# Patient Record
Sex: Male | Born: 1960 | Race: White | Hispanic: No | Marital: Single | Smoking: Current every day smoker
Health system: Southern US, Community
[De-identification: ages and names within clinical notes are randomized; demographics above are authoritative.]

## PROBLEM LIST (undated history)

## (undated) DIAGNOSIS — I5030 Unspecified diastolic (congestive) heart failure: Secondary | ICD-10-CM

## (undated) DIAGNOSIS — R739 Hyperglycemia, unspecified: Secondary | ICD-10-CM

## (undated) DIAGNOSIS — T7840XA Allergy, unspecified, initial encounter: Secondary | ICD-10-CM

## (undated) DIAGNOSIS — D751 Secondary polycythemia: Secondary | ICD-10-CM

## (undated) DIAGNOSIS — I10 Essential (primary) hypertension: Secondary | ICD-10-CM

## (undated) DIAGNOSIS — J449 Chronic obstructive pulmonary disease, unspecified: Secondary | ICD-10-CM

## (undated) HISTORY — PX: KNEE SURGERY: SHX244

---

## 2012-04-21 ENCOUNTER — Emergency Department: Payer: Self-pay | Admitting: Unknown Physician Specialty

## 2012-04-21 LAB — COMPREHENSIVE METABOLIC PANEL
Albumin: 4 g/dL (ref 3.4–5.0)
Alkaline Phosphatase: 96 U/L (ref 50–136)
Anion Gap: 10 (ref 7–16)
Calcium, Total: 8.8 mg/dL (ref 8.5–10.1)
Chloride: 105 mmol/L (ref 98–107)
Co2: 24 mmol/L (ref 21–32)
Creatinine: 0.82 mg/dL (ref 0.60–1.30)
EGFR (African American): >60
EGFR (Non-African Amer.): >60
Osmolality: 276 (ref 275–301)
Potassium: 4.8 mmol/L (ref 3.5–5.1)
SGOT(AST): 47 U/L — ABNORMAL HIGH (ref 15–37)
SGPT (ALT): 30 U/L (ref 12–78)
Sodium: 139 mmol/L (ref 136–145)

## 2012-04-21 LAB — CBC
HGB: 17.7 g/dL (ref 13.0–18.0)
MCH: 33.4 pg (ref 26.0–34.0)
MCHC: 35.3 g/dL (ref 32.0–36.0)
Platelet: 241 10*3/uL (ref 150–440)
RDW: 13.6 % (ref 11.5–14.5)
WBC: 7.2 10*3/uL (ref 3.8–10.6)

## 2015-08-04 ENCOUNTER — Emergency Department (HOSPITAL_COMMUNITY)
Admission: EM | Admit: 2015-08-04 | Discharge: 2015-08-04 | Disposition: A | Discharge status: Home / Self Care | Payer: Self-pay | Attending: Emergency Medicine | Admitting: Emergency Medicine

## 2015-08-04 ENCOUNTER — Encounter (HOSPITAL_COMMUNITY): Payer: Self-pay | Admitting: Nurse Practitioner

## 2015-08-04 DIAGNOSIS — S70361A Insect bite (nonvenomous), right thigh, initial encounter: Secondary | ICD-10-CM | POA: Insufficient documentation

## 2015-08-04 DIAGNOSIS — Y9289 Other specified places as the place of occurrence of the external cause: Secondary | ICD-10-CM | POA: Insufficient documentation

## 2015-08-04 DIAGNOSIS — J029 Acute pharyngitis, unspecified: Secondary | ICD-10-CM | POA: Insufficient documentation

## 2015-08-04 DIAGNOSIS — R220 Localized swelling, mass and lump, head: Secondary | ICD-10-CM | POA: Insufficient documentation

## 2015-08-04 DIAGNOSIS — R0602 Shortness of breath: Secondary | ICD-10-CM | POA: Insufficient documentation

## 2015-08-04 DIAGNOSIS — F17200 Nicotine dependence, unspecified, uncomplicated: Secondary | ICD-10-CM | POA: Insufficient documentation

## 2015-08-04 DIAGNOSIS — Y998 Other external cause status: Secondary | ICD-10-CM | POA: Insufficient documentation

## 2015-08-04 DIAGNOSIS — Z7982 Long term (current) use of aspirin: Secondary | ICD-10-CM | POA: Insufficient documentation

## 2015-08-04 DIAGNOSIS — W57XXXA Bitten or stung by nonvenomous insect and other nonvenomous arthropods, initial encounter: Secondary | ICD-10-CM | POA: Insufficient documentation

## 2015-08-04 DIAGNOSIS — T7840XA Allergy, unspecified, initial encounter: Secondary | ICD-10-CM | POA: Insufficient documentation

## 2015-08-04 DIAGNOSIS — Y9389 Activity, other specified: Secondary | ICD-10-CM | POA: Insufficient documentation

## 2015-08-04 MED ORDER — PREDNISONE 50 MG PO TABS: ORAL_TABLET | ORAL | Status: DC

## 2015-08-04 MED ORDER — METHYLPREDNISOLONE SODIUM SUCC 125 MG IJ SOLR
125.0000 mg | Freq: Once | INTRAMUSCULAR | Status: AC
  Administered 2015-08-04: 125 mg via INTRAVENOUS
  Filled 2015-08-04: qty 2

## 2015-08-04 MED ORDER — EPINEPHRINE 0.3 MG/0.3ML IJ SOAJ: 0.3000 mg | Freq: Once | INTRAMUSCULAR | Status: DC

## 2015-08-04 MED ORDER — FAMOTIDINE IN NACL 20-0.9 MG/50ML-% IV SOLN
20.0000 mg | Freq: Once | INTRAVENOUS | Status: AC
  Administered 2015-08-04: 20 mg via INTRAVENOUS
  Filled 2015-08-04: qty 50

## 2015-08-04 MED ORDER — DIPHENHYDRAMINE HCL 25 MG PO TABS: 25.0000 mg | ORAL_TABLET | Freq: Four times a day (QID) | ORAL | Status: DC | PRN

## 2015-08-04 NOTE — ED Provider Notes (Signed)
CSN: [id number]     Arrival date & time 08/04/15  1550 History   First MD Initiated Contact with Patient 08/04/15 1557     Chief Complaint  Patient presents with  . Allergic Reaction   HPI  Bryan Mitchell is a 55 year old male presenting with allergic reaction. He reports being on his tractor when he noticed "a bug bite" to his right posterior thigh. He began scratching it and then noted his entire body became itchy. He states the areas that he scratched would also become swollen and "turn into welts". He reports swelling to his lips and hands. He also endorsed feelings of his throat closing and shortness of breath. He denies difficulty handling secretions. His family then called EMS who gave him 0.3 mg epinephrine en route. He had also taken benadryl prior to EMS arrival. He states that his symptoms have improved since receiving the epinephrine at 1430. He states he still has a minor throat itching sensation. He states his skin still looks red but is no longer pruritic. EMS noted that his O2 was low on RA. Pt reports a 40 pack year history. He has never followed with a PCP. He denies cough or SOB currently. Denies fevers, chills, syncope, dizziness, headache, eyelid swelling, tongue swelling, neck stiffness, nausea, vomiting or diarrhea.   History reviewed. No pertinent past medical history. History reviewed. No pertinent past surgical history. History reviewed. No pertinent family history. Social History  Substance Use Topics  . Smoking status: Current Every Day Smoker  . Smokeless tobacco: None  . Alcohol Use: Yes    Review of Systems  Constitutional: Negative for fever, chills and diaphoresis.  HENT: Positive for facial swelling and sore throat (itchy). Negative for drooling, rhinorrhea and trouble swallowing.   Eyes: Negative for itching.  Respiratory: Positive for shortness of breath. Negative for cough and wheezing.   Cardiovascular: Negative for chest pain.  Gastrointestinal: Negative  for nausea, vomiting, abdominal pain and diarrhea.  Skin: Positive for color change.  Neurological: Negative for dizziness, syncope and headaches.  All other systems reviewed and are negative.     Allergies  Review of patient's allergies indicates no known allergies.  Home Medications   Prior to Admission medications   Medication Sig Start Date End Date Taking? Authorizing Provider  aspirin 325 MG tablet Take 325 mg by mouth once.    Historical Provider, MD  diphenhydrAMINE (BENADRYL) 25 MG tablet Take 1 tablet (25 mg total) by mouth every 6 (six) hours as needed. 08/04/15   Shyheem Whitham, PA-C  EPINEPHrine 0.3 mg/0.3 mL IJ SOAJ injection Inject 0.3 mLs (0.3 mg total) into the muscle once. 08/04/15   Shunsuke Granzow, PA-C  predniSONE (DELTASONE) 50 MG tablet Take 1 tablet once a day for 5 days 08/04/15   Orman Matsumura, PA-C   BP 119/76 mmHg  Pulse 111  Temp(Src) 98.1 F (36.7 C) (Oral)  Resp 18  SpO2 95% Physical Exam  Constitutional: He appears well-developed and well-nourished. No distress.  BP 122/79 O2 94%  HENT:  Head: Normocephalic and atraumatic.  Mouth/Throat: Oropharynx is clear and moist.  No eyelid, lip or tongue edema. Oropharynx is clear without edema but appears slightly erythematous. Uvula is midline without edema. Pt handling secretions well.   Eyes: Conjunctivae are normal. Right eye exhibits no discharge. Left eye exhibits no discharge. No scleral icterus.  Neck: Normal range of motion. Neck supple.  No stridor. FROM of neck intact.   Cardiovascular: Normal rate, regular rhythm and  normal heart sounds.   Pulmonary/Chest: Effort normal and breath sounds normal. No respiratory distress. He has no wheezes. He has no rales.  Breathing unlabored. Lungs CTAB  Abdominal: Soft. He exhibits no distension. There is no tenderness.  Musculoskeletal: Normal range of motion.  Moves all extremities spontaneously  Neurological: He is alert. Coordination normal.  Skin: Skin is  warm and dry. No rash noted.  Faint streaks of erythema noted over forearms and back where pt had been scratching.   Psychiatric: He has a normal mood and affect. His behavior is normal.  Nursing note and vitals reviewed.   ED Course  Procedures (including critical care time) Labs Review Labs Reviewed - No data to display  Imaging Review No results found. I have personally reviewed and evaluated these images and lab results as part of my medical decision-making.   EKG Interpretation None     6:15 - Re-evaluated pt. Reports improvement in symptoms. States his throat no longer feels "swollen". O2 remains ~95% on RA. Pt is requesting discharge home. It has been 4 hours since epipen administration and patient has had no new or recurrent symptoms. Discussed that pt will need to follow up with PCP which he states he will not do. Long discussion about importance of PCP for follow up. Pt states understanding but still declines. Will provide with [facility] information in discharge paperwork.   MDM   Final diagnoses:  Allergic reaction, initial encounter   Patient presenting with allergic reaction. Denies difficulty breathing, swallowing or handling secretions. No edema of eyelids or lips, no oropharyngeal edema, no stridor, no wheezing and patient is in no respiratory distress. Treated with epipen, solu-medrol, benadryl and pepcid. Patient re-evaluated prior to dc and reports symptom resolution. Patient is hemodynamically stable, in no respiratory distress, and denies the feeling of throat closing. Oxygen remained between 92-95% during stay. Likely slightly decreased due to 40 pack year history. Will discharge with epipen, prednisone burst and benadryl. Instructed to return to the ED if they have recurrent reaction with symptoms including difficulty breathing, difficulty swallowing, sensation of throat closing or swelling of the lips, face or tongue. Pt is to follow up with their PCP as needed. Pt is  agreeable with plan & verbalizes understanding. Return precautions given in discharge paperwork and discussed with pt at bedside. Pt stable for discharge      Alveta Heimlich, PA-C 08/04/15 1948  Alvira Monday, MD 08/05/15 2356

## 2015-08-04 NOTE — ED Notes (Signed)
Patient developed a welt on his left leg earlier today and noticed swelling in his hands and throat. He also complained of itching all over. He took  of benadryl and ems gave him 0.3mg  of epinephrine. He states his symptoms have improved but his SpO2 was dropping without supplemental oxygen with EMS.

## 2015-08-04 NOTE — Discharge Instructions (Signed)
Schedule a follow up appointment with a primary care provider to follow up. Return to ED if your symptoms return, your experience, eyelid, lip or tongue swelling, you feel sensation of throat swelling or closing, wheezing, shortness of breath, passing out or any other, new, worsening or concerning symptoms.  Anaphylactic Reaction    An anaphylactic reaction is a sudden, severe allergic reaction that involves the whole body. It can be life threatening. A hospital stay is often required. People with asthma, eczema, or hay fever are slightly more likely to have an anaphylactic reaction.  CAUSES  An anaphylactic reaction may be caused by anything to which you are allergic. After being exposed to the allergic substance, your immune system becomes sensitized to it. When you are exposed to that allergic substance again, an allergic reaction can occur. Common causes of an anaphylactic reaction include:  Medicines.  Foods, especially peanuts, wheat, shellfish, milk, and eggs.  Insect bites or stings.  Blood products.  Chemicals, such as dyes, latex, and contrast material used for imaging tests. SYMPTOMS  When an allergic reaction occurs, the body releases histamine and other substances. These substances cause symptoms such as tightening of the airway. Symptoms often develop within seconds or minutes of exposure. Symptoms may include:  Skin rash or hives.  Itching.  Chest tightness.  Swelling of the eyes, tongue, or lips.  Trouble breathing or swallowing.  Lightheadedness or fainting.  Anxiety or confusion.  Stomach pains, vomiting, or diarrhea.  Nasal congestion.  A fast or irregular heartbeat (palpitations). DIAGNOSIS  Diagnosis is based on your history of recent exposure to allergic substances, your symptoms, and a physical exam. Your caregiver may also perform blood or urine tests to confirm the diagnosis.  TREATMENT  Epinephrine medicine is the main treatment for an anaphylactic reaction.  Other medicines that may be used for treatment include antihistamines, steroids, and albuterol. In severe cases, fluids and medicine to support blood pressure may be given through an intravenous line (IV). Even if you improve after treatment, you need to be observed to make sure your condition does not get worse. This may require a stay in the hospital.  [place] a medical alert bracelet or necklace stating your allergy.  You and your family must learn how to use an anaphylaxis kit or give an epinephrine injection to temporarily treat an emergency allergic reaction. Always carry your epinephrine injection or anaphylaxis kit with you. This can be lifesaving if you have a severe reaction.  Do not drive or perform tasks after treatment until the medicines used to treat your reaction have worn off, or until your caregiver says it is okay.  If you have hives or a rash:  Take medicines as directed by your caregiver.  You may use an over-the-counter antihistamine (diphenhydramine) as needed.  Apply cold compresses to the skin or take baths in cool water. Avoid hot baths or showers. SEEK MEDICAL CARE IF:  You develop symptoms of an allergic reaction to a new substance. Symptoms may start right away or minutes later.  You develop a rash, hives, or itching.  You develop new symptoms. SEEK IMMEDIATE MEDICAL CARE IF:  You have swelling of the mouth, difficulty breathing, or wheezing.  You have a tight feeling in your chest or throat.  You develop hives, swelling, or itching all over your body.  You develop severe vomiting or diarrhea.  You feel faint or pass out. This is an emergency. Use your epinephrine injection or anaphylaxis kit  as you have been instructed. Call your local emergency services (911 in [place] Even if you improve after the injection, you need to be examined at a hospital emergency department.  MAKE SURE YOU:  Understand these instructions.  Will watch your condition.    Will get help right away if you are not doing well or get worse. This information is not intended to replace advice given to you by your health care provider. Make sure you discuss any questions you have with your health care provider.  Document Released: 06/24/2005 Document Revised: 06/29/2013 Document Reviewed: 01/04/2015  Elsevier Interactive Patient Education [employer].

## 2015-08-04 NOTE — ED Notes (Signed)
Bed: YQ65 Expected date:  Expected time:  Means of arrival:  Comments: Ems-allergic reaction

## 2015-12-29 ENCOUNTER — Emergency Department (HOSPITAL_COMMUNITY)
Admission: EM | Admit: 2015-12-29 | Discharge: 2015-12-29 | Disposition: A | Discharge status: Home / Self Care | Payer: Self-pay | Attending: Emergency Medicine | Admitting: Emergency Medicine

## 2015-12-29 ENCOUNTER — Encounter (HOSPITAL_COMMUNITY): Payer: Self-pay

## 2015-12-29 ENCOUNTER — Emergency Department (HOSPITAL_COMMUNITY): Payer: Self-pay

## 2015-12-29 ENCOUNTER — Other Ambulatory Visit: Payer: Self-pay

## 2015-12-29 DIAGNOSIS — T782XXA Anaphylactic shock, unspecified, initial encounter: Secondary | ICD-10-CM | POA: Insufficient documentation

## 2015-12-29 DIAGNOSIS — L509 Urticaria, unspecified: Secondary | ICD-10-CM

## 2015-12-29 DIAGNOSIS — R42 Dizziness and giddiness: Secondary | ICD-10-CM | POA: Insufficient documentation

## 2015-12-29 DIAGNOSIS — F17200 Nicotine dependence, unspecified, uncomplicated: Secondary | ICD-10-CM | POA: Insufficient documentation

## 2015-12-29 DIAGNOSIS — Z7982 Long term (current) use of aspirin: Secondary | ICD-10-CM | POA: Insufficient documentation

## 2015-12-29 LAB — CBC
HEMATOCRIT: 55 % — AB (ref 39.0–52.0)
Hemoglobin: 18.3 g/dL — ABNORMAL HIGH (ref 13.0–17.0)
MCH: 31 pg (ref 26.0–34.0)
MCHC: 33.3 g/dL (ref 30.0–36.0)
MCV: 93.1 fL (ref 78.0–100.0)
PLATELETS: 250 10*3/uL (ref 150–400)
RBC: 5.91 MIL/uL — ABNORMAL HIGH (ref 4.22–5.81)
RDW: 12.9 % (ref 11.5–15.5)
WBC: 6.8 10*3/uL (ref 4.0–10.5)

## 2015-12-29 LAB — COMPREHENSIVE METABOLIC PANEL
ALT: 22 U/L (ref 17–63)
AST: 23 U/L (ref 15–41)
Albumin: 3.6 g/dL (ref 3.5–5.0)
Alkaline Phosphatase: 65 U/L (ref 38–126)
Anion gap: 9 (ref 5–15)
BUN: 21 mg/dL — AB (ref 6–20)
CHLORIDE: 102 mmol/L (ref 101–111)
CO2: 28 mmol/L (ref 22–32)
CREATININE: 1.87 mg/dL — AB (ref 0.61–1.24)
Calcium: 9.5 mg/dL (ref 8.9–10.3)
GFR calc Af Amer: 46 mL/min — ABNORMAL LOW (ref >60)
GFR, EST NON AFRICAN AMERICAN: 39 mL/min — AB (ref >60)
GLUCOSE: 140 mg/dL — AB (ref 65–99)
Potassium: 3.8 mmol/L (ref 3.5–5.1)
Sodium: 139 mmol/L (ref 135–145)
Total Bilirubin: 0.5 mg/dL (ref 0.3–1.2)
Total Protein: 6.8 g/dL (ref 6.5–8.1)

## 2015-12-29 LAB — I-STAT CG4 LACTIC ACID, ED: LACTIC ACID, VENOUS: 2.98 mmol/L — AB (ref 0.5–2.0)

## 2015-12-29 LAB — I-STAT TROPONIN, ED: Troponin i, poc: 0 ng/mL (ref 0.00–0.08)

## 2015-12-29 MED ORDER — SODIUM CHLORIDE 0.9 % IV BOLUS (SEPSIS)
1000.0000 mL | Freq: Once | INTRAVENOUS | Status: AC
  Administered 2015-12-29: 1000 mL via INTRAVENOUS

## 2015-12-29 MED ORDER — ALBUTEROL SULFATE (2.5 MG/3ML) 0.083% IN NEBU
5.0000 mg | INHALATION_SOLUTION | Freq: Once | RESPIRATORY_TRACT | Status: AC
  Administered 2015-12-29: 5 mg via RESPIRATORY_TRACT
  Filled 2015-12-29: qty 6

## 2015-12-29 MED ORDER — FAMOTIDINE IN NACL 20-0.9 MG/50ML-% IV SOLN
20.0000 mg | Freq: Once | INTRAVENOUS | Status: AC
  Administered 2015-12-29: 20 mg via INTRAVENOUS
  Filled 2015-12-29: qty 50

## 2015-12-29 MED ORDER — EPINEPHRINE 0.3 MG/0.3ML IJ SOAJ
0.3000 mg | Freq: Once | INTRAMUSCULAR | Status: AC
  Administered 2015-12-29: 0.3 mg via INTRAMUSCULAR
  Filled 2015-12-29: qty 0.3

## 2015-12-29 MED ORDER — METHYLPREDNISOLONE SODIUM SUCC 125 MG IJ SOLR
125.0000 mg | Freq: Once | INTRAMUSCULAR | Status: AC
  Administered 2015-12-29: 125 mg via INTRAVENOUS
  Filled 2015-12-29: qty 2

## 2015-12-29 MED ORDER — EPINEPHRINE 0.3 MG/0.3ML IJ SOAJ: 0.3000 mg | Freq: Once | INTRAMUSCULAR | Status: DC

## 2015-12-29 NOTE — Discharge Instructions (Signed)
Benadryl for hives and itching. Epi pen if you feel persistent lightheaded/ pass out of breathing difficulty/ tongue swelling.  If you were given medicines take as directed.  If you are on coumadin or contraceptives realize their levels and effectiveness is altered by many different medicines.  If you have any reaction (rash, tongues swelling, other) to the medicines stop taking and see a physician.    If your blood pressure was elevated in the ER make sure you follow up for management with a primary doctor or return for chest pain, shortness of breath or stroke symptoms.  Please follow up as directed and return to the ER or see a physician for new or worsening symptoms.  Thank you. Filed Vitals:   12/29/15 2015 12/29/15 2030 12/29/15 2045 12/29/15 2202  BP: 144/91 133/81 152/73 158/83  Pulse: 112 115 116 106  Resp: 22 25 24 16   SpO2: 96% 95% 92% 94%    Anaphylactic Reaction An anaphylactic reaction is a sudden, severe allergic reaction. It affects the whole body. It can be life threatening. You may need to stay in the hospital.  [place] a medical bracelet or necklace that lists your allergy.  Carry your allergy kit or medicine shot to treat severe allergic reactions with you. These can save your life.  Do not drive until medicine from your shot has worn off, unless your doctor says it is okay.  If you have hives or a rash:  Take medicine as told by your doctor.  You may take over-the-counter antihistamine medicine.  Place cold cloths on your skin. Take baths in cool water. Avoid hot baths and hot showers. GET HELP RIGHT AWAY IF:   Your mouth is puffy (swollen), or you have trouble breathing.  You start making whistling sounds when you breathe (wheezing).  You have a tight feeling in your chest or throat.  You have a rash, hives, puffiness, or itching on your body.  You throw up (vomit) or have watery poop (diarrhea).  You feel dizzy or pass out (faint).  You  think you are having an allergic reaction.  You have new symptoms. This is an emergency. Use your medicine shot or allergy kit as told. Call your local emergency services (911 in [place] Even if you feel better after the shot, you need to go to the hospital emergency department. MAKE SURE YOU:   Understand these instructions.  Will watch your condition.  Will get help right away if you are not doing well or get worse.   This information is not intended to replace advice given to you by your health care provider. Make sure you discuss any questions you have with your health care provider.   Document Released: 12/11/2007 Document Revised: 12/24/2011 Document Reviewed: 01/04/2015 Elsevier Interactive Patient Education [employer].

## 2015-12-29 NOTE — ED Notes (Signed)
Pt. Was outside mowing his grass and began to notice that he was having redness to his rt. Posterior thigh and pt took 75 mg benadryl.   Paramedics gave him 0.3 Epi  SQ at 17: 15 felt better and started to have dizziness.  Redness has decreased but swelling is noted to bilateral arms and legs.  Airway intact and pt. Is talking in full sentences.  Pt. Is hypotensive.

## 2015-12-29 NOTE — ED Provider Notes (Signed)
CSN: [id number]     Arrival date & time 12/29/15  1800 History   First MD Initiated Contact with Patient 12/29/15 1803     Chief Complaint  Patient presents with  . Allergic Reaction     (Consider location/radiation/quality/duration/timing/severity/associated sxs/prior Treatment) HPI Comments: 55 year old male with smoking history no other significant medical problems presents with hypotension and rash. Patient is cutting his grass he started feeling generally itchy and noticed a rash on his thighs. Patient took Benadryl and continue to cut the grass and then he started feeling very lightheaded. For EMS with blood pressure was in the 70s. Patient received epinephrine injection and brought the hospital. Patient Benadryl at home. No known allergies. Patient did not feel a specific bite and no other new exposures or new medications. No history of similar.  Patient is a 55 y.o. male presenting with allergic reaction. The history is provided by the patient.  Allergic Reaction Presenting symptoms: rash     History reviewed. No pertinent past medical history. History reviewed. No pertinent past surgical history. No family history on file. Social History  Substance Use Topics  . Smoking status: Current Every Day Smoker  . Smokeless tobacco: None  . Alcohol Use: Yes    Review of Systems  Constitutional: Positive for appetite change. Negative for fever and chills.  HENT: Negative for congestion.   Eyes: Negative for visual disturbance.  Respiratory: Negative for shortness of breath.   Cardiovascular: Negative for chest pain.  Gastrointestinal: Negative for vomiting and abdominal pain.  Genitourinary: Negative for dysuria and flank pain.  Musculoskeletal: Negative for back pain, neck pain and neck stiffness.  Skin: Positive for rash.  Neurological: Positive for light-headedness. Negative for headaches.      Allergies  Other  Home Medications   Prior to Admission medications    Medication Sig Start Date End Date Taking? Authorizing Provider  Aspirin-Acetaminophen (GOODYS BODY PAIN PO) Take 1 packet by mouth daily as needed (for aches/pains (back)).   Yes Historical Provider, MD  diphenhydrAMINE (BENADRYL) 25 MG tablet Take 1 tablet (25 mg total) by mouth every 6 (six) hours as needed. 08/04/15  Yes Stevi Barrett, PA-C  EPINEPHrine 0.3 mg/0.3 mL IJ SOAJ injection Inject 0.3 mLs (0.3 mg total) into the muscle once. 08/04/15  Yes Stevi Barrett, PA-C  EPINEPHrine 0.3 mg/0.3 mL IJ SOAJ injection Inject 0.3 mLs (0.3 mg total) into the muscle once. 12/29/15   Blane Ohara, MD  predniSONE (DELTASONE) 50 MG tablet Take 1 tablet once a day for 5 days Patient not taking: Reported on 12/29/2015 08/04/15   Stevi Barrett, PA-C   BP 158/83 mmHg  Pulse 106  Resp 16  SpO2 94% Physical Exam  Constitutional: He is oriented to person, place, and time. He appears well-developed and well-nourished.  HENT:  Head: Normocephalic and atraumatic.  Dry mucous membranes No angioedema  Eyes: Right eye exhibits no discharge. Left eye exhibits no discharge.  Neck: Normal range of motion. Neck supple. No tracheal deviation present.  Cardiovascular: Regular rhythm.  Tachycardia present.   Pulmonary/Chest: Effort normal. He has wheezes (mild exp bilateral).  Abdominal: Soft. He exhibits no distension. There is no tenderness. There is no guarding.  Musculoskeletal: He exhibits no edema.  Neurological: He is alert and oriented to person, place, and time. He has normal strength. No cranial nerve deficit. GCS eye subscore is 4. GCS verbal subscore is 5. GCS motor subscore is 6.  General weak appearing. Asian alert and oriented.  Skin: Skin is  warm. Rash noted.  Patient has hive-like rash posterior thighs and medial thighs bilateral. Patient has fungal appearing rash in the groin  Psychiatric: He has a normal mood and affect.  Nursing note and vitals reviewed.   ED Course  Procedures (including  critical care time) CRITICAL CARE Performed by: Enid SkeensZAVITZ, Demeka Sutter M  Total critical care time: 35 minutes  Critical care time was exclusive of separately billable procedures and treating other patients.  Critical care was necessary to treat or prevent imminent or life-threatening deterioration.  Critical care was time spent personally by me on the following activities: development of treatment plan with patient and/or surrogate as well as nursing, discussions with consultants, evaluation of patient's response to treatment, examination of patient, obtaining history from patient or surrogate, ordering and performing treatments and interventions, ordering and review of laboratory studies, ordering and review of radiographic studies, pulse oximetry and re-evaluation of patient's condition.  Emergency Ultrasound Study:   Angiocath insertion Performed by: Enid SkeensZAVITZ, Kaydyn Sayas M  Consent: Verbal consent obtained. Risks and benefits: risks, benefits and alternatives were discussed Immediately prior to procedure the correct patient, procedure, equipment, support staff and site/side marked as needed.  Indication: difficult IV access Preparation: Patient was prepped and draped in the usual sterile fashion. Vein Location: right ac vein was visualized during assessment for potential access sites and was found to be patent/ easily compressed with linear ultrasound.  The needle was visualized with real-time ultrasound and guided into the vein. Gauge: 18 g  Image saved and stored.  Normal blood return.  Patient tolerance: Patient tolerated the procedure well with no immediate complications.     EMERGENCY DEPARTMENT US CARDIAC EXAM "Study: Limited Ultrasound of the heart and pericardium"  INDICATIONS:Hypotension Multiple views of the heart and pericardium were obtained in real-time with a multi-frequency probe.  PERFORMED ZO:XWRUEABY:Myself  IMAGES ARCHIVED?: Yes  FINDINGS: No pericardial effusion, IVC flat and  Tamponade physiology absent  LIMITATIONS:  Body habitus  VIEWS USED: Subcostal 4 chamber and Inferior Vena Cava  INTERPRETATION: Cardiac activity present, Pericardial effusioin absent and Probable low CVP Very difficulty views, only SX CPT Code: 54098-1193308-26 (limited transthoracic cardiac)  Labs Review Labs Reviewed  CBC - Abnormal; Notable for the following:    RBC 5.91 (*)    Hemoglobin 18.3 (*)    HCT 55.0 (*)    All other components within normal limits  COMPREHENSIVE METABOLIC PANEL - Abnormal; Notable for the following:    Glucose, Bld 140 (*)    BUN 21 (*)    Creatinine, Ser 1.87 (*)    GFR calc non Af Amer 39 (*)    GFR calc Af Amer 46 (*)    All other components within normal limits  I-STAT CG4 LACTIC ACID, ED - Abnormal; Notable for the following:    Lactic Acid, Venous 2.98 (*)    All other components within normal limits  I-STAT TROPOININ, ED    Imaging Review Dg Chest Portable 1 View  12/29/2015  CLINICAL DATA:  Allergic reaction of unknown origin, mid chest pain, shortness of breath, hypotension, smoker EXAM: PORTABLE CHEST 1 VIEW COMPARISON:  Portable exam 1831 hours without priors for comparison. FINDINGS: Normal heart size and mediastinal contours. Minimal pulmonary vascular cephalization. Lungs clear. No pleural effusion or pneumothorax. Bones unremarkable. IMPRESSION: No acute abnormalities. Electronically Signed   By: Ulyses SouthwardMark  Boles M.D.   On: 12/29/2015 18:49   I have personally reviewed and evaluated these images and lab results as part of my medical decision-making.  EKG Interpretation None     EKG reviewed heart rate 103 sinus tachycardia, normal QT, no acute ST changes. MDM   Final diagnoses:  Anaphylactic shock, initial encounter  Hives   Patient presents with hypotension and rash. Clinical concern for anaphylaxis however no definitive bite or obvious source per history and report. Plan for blood work, cardiac screen, epinephrine emergently  intramuscular. If no improvement fluids and treatment plan for epinephrine drip. No angioedema.  BP normalized, pt sxs resolved, ambulated, po challenge.  He requesting to go home, has epi pen at home.   Results and differential diagnosis were discussed with the patient/parent/guardian. Xrays were independently reviewed by myself.  Close follow up outpatient was discussed, comfortable with the plan.   Medications  sodium chloride 0.9 % bolus 1,000 mL (0 mLs Intravenous Stopped 12/29/15 2033)  famotidine (PEPCID) IVPB 20 mg premix (0 mg Intravenous Stopped 12/29/15 1922)  methylPREDNISolone sodium succinate (SOLU-MEDROL) 125 mg/2 mL injection 125 mg (125 mg Intravenous Given 12/29/15 1833)  EPINEPHrine (EPI-PEN) injection 0.3 mg (0.3 mg Intramuscular Given 12/29/15 1857)  albuterol (PROVENTIL) (2.5 MG/3ML) 0.083% nebulizer solution 5 mg (5 mg Nebulization Given 12/29/15 1859)  sodium chloride 0.9 % bolus 1,000 mL (0 mLs Intravenous Stopped 12/29/15 2033)    Filed Vitals:   12/29/15 2015 12/29/15 2030 12/29/15 2045 12/29/15 2202  BP: 144/91 133/81 152/73 158/83  Pulse: 112 115 116 106  Resp: 22 25 24 16   SpO2: 96% 95% 92% 94%    Final diagnoses:  Anaphylactic shock, initial encounter  Hives       Blane OharaJoshua Kenechukwu Eckstein, MD 12/29/15 2222

## 2015-12-29 NOTE — ED Notes (Signed)
Dr. Danae ChenZavit explained plan of care and discharge plan to pt.

## 2016-02-24 ENCOUNTER — Emergency Department (HOSPITAL_COMMUNITY)
Admission: EM | Admit: 2016-02-24 | Discharge: 2016-02-24 | Disposition: A | Discharge status: Home / Self Care | Payer: Self-pay | Attending: Emergency Medicine | Admitting: Emergency Medicine

## 2016-02-24 ENCOUNTER — Encounter (HOSPITAL_COMMUNITY): Payer: Self-pay

## 2016-02-24 DIAGNOSIS — R21 Rash and other nonspecific skin eruption: Secondary | ICD-10-CM | POA: Insufficient documentation

## 2016-02-24 DIAGNOSIS — Z7982 Long term (current) use of aspirin: Secondary | ICD-10-CM | POA: Insufficient documentation

## 2016-02-24 DIAGNOSIS — T7840XA Allergy, unspecified, initial encounter: Secondary | ICD-10-CM

## 2016-02-24 DIAGNOSIS — F17200 Nicotine dependence, unspecified, uncomplicated: Secondary | ICD-10-CM | POA: Insufficient documentation

## 2016-02-24 DIAGNOSIS — T781XXA Other adverse food reactions, not elsewhere classified, initial encounter: Secondary | ICD-10-CM | POA: Insufficient documentation

## 2016-02-24 MED ORDER — FAMOTIDINE IN NACL 20-0.9 MG/50ML-% IV SOLN
20.0000 mg | Freq: Once | INTRAVENOUS | Status: DC
  Filled 2016-02-24: qty 50

## 2016-02-24 MED ORDER — FAMOTIDINE 20 MG PO TABS
20.0000 mg | ORAL_TABLET | Freq: Once | ORAL | Status: AC
  Administered 2016-02-24: 20 mg via ORAL
  Filled 2016-02-24: qty 1

## 2016-02-24 MED ORDER — DEXAMETHASONE SODIUM PHOSPHATE 10 MG/ML IJ SOLN
10.0000 mg | Freq: Once | INTRAMUSCULAR | Status: AC
  Administered 2016-02-24: 10 mg via INTRAMUSCULAR
  Filled 2016-02-24: qty 1

## 2016-02-24 MED ORDER — DIPHENHYDRAMINE HCL 50 MG/ML IJ SOLN
25.0000 mg | Freq: Once | INTRAMUSCULAR | Status: AC
  Administered 2016-02-24: 25 mg via INTRAMUSCULAR

## 2016-02-24 MED ORDER — SODIUM CHLORIDE 0.9 % IV BOLUS (SEPSIS): 1000.0000 mL | Freq: Once | INTRAVENOUS | Status: DC

## 2016-02-24 MED ORDER — METHYLPREDNISOLONE SODIUM SUCC 125 MG IJ SOLR
125.0000 mg | Freq: Once | INTRAMUSCULAR | Status: DC
  Filled 2016-02-24: qty 2

## 2016-02-24 MED ORDER — DIPHENHYDRAMINE HCL 50 MG/ML IJ SOLN
12.5000 mg | Freq: Once | INTRAMUSCULAR | Status: DC
  Filled 2016-02-24: qty 1

## 2016-02-24 NOTE — ED Provider Notes (Signed)
[facility] DEPT Provider Note   CSN: [id number] Arrival date & time: 02/24/16  1758     History   Chief Complaint Chief Complaint  Patient presents with  . Allergic Reaction    HPI Bryan Mitchell is a 55 y.o. male the past medical history of obesity, COPD who presents to the ED today complaining of an allergic reaction. Patient states that he ate a hamburger with chili powder on it and shortly after developed hives on his posterior neck, back, bilateral arms and back of his legs. Patient began to feel like his throat was closing and he took some oral Benadryl. He states he walked over to his brother's house who checked his blood pressure which was elevated so he decided to call 911. On EMS arrival they gave him an epinephrine injection and a breathing treatment. They were unable to obtain IV access. Patient now states that he feels significantly better. She denies any difficulty breathing, lip or tongue swelling.  Upon review of patient's records. 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 Patient then became hypotensive.  HPI  History reviewed. No pertinent past medical history.  There are no active problems to display for this patient.   History reviewed. No pertinent surgical history.     Home Medications    Prior to Admission medications   Medication Sig Start Date End Date Taking? Authorizing Provider  Aspirin-Acetaminophen (GOODYS BODY PAIN PO) Take 1 packet by mouth daily as needed (for aches/pains (back)).    Historical Provider, MD  diphenhydrAMINE (BENADRYL) 25 MG tablet Take 1 tablet (25 mg total) by mouth every 6 (six) hours as needed. 08/04/15   Stevi Barrett, PA-C  EPINEPHrine 0.3 mg/0.3 mL IJ SOAJ injection Inject 0.3 mLs (0.3 mg total) into the muscle once. 08/04/15   Stevi Barrett, PA-C  EPINEPHrine 0.3 mg/0.3 mL IJ SOAJ injection Inject 0.3 mLs (0.3 mg total) into the muscle once. 12/29/15   Blane OharaJoshua Zavitz, MD  predniSONE  (DELTASONE) 50 MG tablet Take 1 tablet once a day for 5 days Patient not taking: Reported on 12/29/2015 08/04/15   Alveta HeimlichStevi Barrett, PA-C    Family History History reviewed. No pertinent family history.  Social History Social History  Substance Use Topics  . Smoking status: Current Every Day Smoker  . Smokeless tobacco: Not on file  . Alcohol use Yes     Allergies   Other   Review of Systems Review of Systems  All other systems reviewed and are negative.    Physical Exam Updated Vital Signs BP 146/70 (BP Location: Left Arm)   Pulse 94   Temp 98.4 F (36.9 C) (Oral)   Resp 18   Ht 5\' 8"  (1.727 m)   Wt 136.1 kg   SpO2 92%   BMI 45.61 kg/m   Physical Exam  Constitutional: He is oriented to person, place, and time. No distress.  obese  HENT:  Head: Normocephalic and atraumatic.  Mouth/Throat: No oropharyngeal exudate.  No lip or tongue swelling  Eyes: Conjunctivae and EOM are normal. Pupils are equal, round, and reactive to light. Right eye exhibits no discharge. Left eye exhibits no discharge. No scleral icterus.  Cardiovascular: Normal rate, regular rhythm, normal heart sounds and intact distal pulses.  Exam reveals no gallop and no friction rub.   No murmur heard. Pulmonary/Chest: Effort normal and breath sounds normal. No respiratory distress. He has no wheezes. He has no rales. He exhibits no tenderness.  Abdominal: Soft. He exhibits no  distension. There is no tenderness. There is no guarding.  Musculoskeletal: Normal range of motion. He exhibits no edema.  Neurological: He is alert and oriented to person, place, and time.  Skin: Skin is warm and dry. Rash ( erythematous macular papular rash in R AC joint and over dorsum of R hand) noted. He is not diaphoretic. No erythema. No pallor.  Psychiatric: He has a normal mood and affect. His behavior is normal.  Nursing note and vitals reviewed.    ED Treatments / Results  Labs (all labs ordered are listed, but only  abnormal results are displayed) Labs Reviewed - No data to display  EKG  EKG Interpretation None       Radiology No results found.  Procedures Procedures (including critical care time)  Medications Ordered in ED Medications  methylPREDNISolone sodium succinate (SOLU-MEDROL) 125 mg/2 mL injection 125 mg (not administered)  famotidine (PEPCID) IVPB 20 mg premix (not administered)  diphenhydrAMINE (BENADRYL) injection 12.5 mg (not administered)  sodium chloride 0.9 % bolus 1,000 mL (not administered)     Initial Impression / Assessment and Plan / ED Course  I have reviewed the triage vital signs and the nursing notes.  Pertinent labs & imaging results that were available during my care of the patient were reviewed by me and considered in my medical decision making (see chart for details).  Clinical Course   55 y.o M presents to the ED c/o allergic reaction that occurred 1 hout PTA after eating hamburgers. Pt had rash all over back and extremities and associated sensation of his throat closing. He took home benadryl and called EMS. EMS gave pt epipen . Pt now feels much better. IN ED, no sign of respiratory distress. Pt has hx of anaphylactic shock in June of 2017. BP in ED is wnl. Difficult to obtain IV access. Pt given IM benadryl, decadron and oral Pepcid. He was monitored in the ED for 2 hours and is now requesting discharge.  Patient re-evaluated prior to dc, is hemodynamically stable, in no respiratory distress, and denies the feeling of throat closing. Pt has been advised to take OTC benadryl & return to the ED if they have a mod-severe allergic rxn (s/s including throat closing, difficulty breathing, swelling of lips face or tongue). He also has home epipen.Pt is to follow up with their PCP and referral also given to allergist. Pt is agreeable with plan & verbalizes understanding.  Patient was discussed with and seen by Dr. Estell HarpinZammit who agrees with the treatment plan.      Final Clinical Impressions(s) / ED Diagnoses   Final diagnoses:  Allergic reaction, initial encounter    New Prescriptions New Prescriptions   No medications on file     Dub MikesSamantha Tripp Nimai Burbach, PA-C 02/24/16 2006    Bethann BerkshireJoseph Zammit, MD 02/24/16 2328

## 2016-02-24 NOTE — Discharge Instructions (Signed)
Please carry epi-pen with you at all times. Follow up with Allergist for further evaluation. Take benadryl as needed for rash and itching. Return to the ED if you experience lip or tongue swelling, difficulty breathing or swallowing or repeat of your symptoms from today.

## 2016-02-24 NOTE — ED Notes (Signed)
Pt departed in NAD.  

## 2016-02-24 NOTE — ED Triage Notes (Signed)
Per EMS, pt from home with complaint of reaction. Pt was outside and became short of breath, with hives on his back, and felt like "my throat is swelling." Airway intact. On scene, pt took his own benadryl 50mg , pt was given IM epi on scene by EMS. IV access unsuccessfuly by EMS. Pt given total of 10mg  albuterol in route, spo2 on RA 87%, on neb 98%. Breath sounds slightly decreased,clear. BP 154/60, HR 98 sinus. RR 20. Pt alert and oriented x 4. Pt does not see a regular dr. Pt also is trying to detox himself off of alcohol as well.

## 2016-07-11 ENCOUNTER — Encounter (HOSPITAL_COMMUNITY): Payer: Self-pay | Admitting: Neurology

## 2016-07-11 ENCOUNTER — Emergency Department (HOSPITAL_COMMUNITY): Payer: Self-pay

## 2016-07-11 ENCOUNTER — Emergency Department (HOSPITAL_COMMUNITY)
Admission: EM | Admit: 2016-07-11 | Discharge: 2016-07-11 | Disposition: A | Discharge status: Home / Self Care | Payer: Self-pay | Attending: Emergency Medicine | Admitting: Emergency Medicine

## 2016-07-11 DIAGNOSIS — Z79899 Other long term (current) drug therapy: Secondary | ICD-10-CM | POA: Insufficient documentation

## 2016-07-11 DIAGNOSIS — R600 Localized edema: Secondary | ICD-10-CM | POA: Insufficient documentation

## 2016-07-11 DIAGNOSIS — Z7982 Long term (current) use of aspirin: Secondary | ICD-10-CM | POA: Insufficient documentation

## 2016-07-11 DIAGNOSIS — T7840XA Allergy, unspecified, initial encounter: Secondary | ICD-10-CM

## 2016-07-11 DIAGNOSIS — I10 Essential (primary) hypertension: Secondary | ICD-10-CM | POA: Insufficient documentation

## 2016-07-11 DIAGNOSIS — L500 Allergic urticaria: Secondary | ICD-10-CM | POA: Insufficient documentation

## 2016-07-11 DIAGNOSIS — F17200 Nicotine dependence, unspecified, uncomplicated: Secondary | ICD-10-CM | POA: Insufficient documentation

## 2016-07-11 HISTORY — DX: Essential (primary) hypertension: I10

## 2016-07-11 MED ORDER — RANITIDINE HCL 150 MG PO TABS: 150.0000 mg | ORAL_TABLET | Freq: Two times a day (BID) | ORAL | 0 refills | Status: DC

## 2016-07-11 MED ORDER — PREDNISONE 10 MG PO TABS: 20.0000 mg | ORAL_TABLET | Freq: Every day | ORAL | 0 refills | Status: AC

## 2016-07-11 MED ORDER — FUROSEMIDE 20 MG PO TABS
20.0000 mg | ORAL_TABLET | Freq: Once | ORAL | Status: AC
  Administered 2016-07-11: 20 mg via ORAL
  Filled 2016-07-11: qty 1

## 2016-07-11 MED ORDER — EPINEPHRINE 0.3 MG/0.3ML IJ SOAJ: 0.3000 mg | Freq: Once | INTRAMUSCULAR | 0 refills | Status: AC

## 2016-07-11 MED ORDER — DIPHENHYDRAMINE HCL 25 MG PO TABS: 25.0000 mg | ORAL_TABLET | Freq: Four times a day (QID) | ORAL | 0 refills | Status: DC

## 2016-07-11 MED ORDER — FAMOTIDINE 20 MG PO TABS
20.0000 mg | ORAL_TABLET | Freq: Once | ORAL | Status: AC
  Administered 2016-07-11: 20 mg via ORAL
  Filled 2016-07-11: qty 1

## 2016-07-11 NOTE — ED Triage Notes (Signed)
Per ems- Pt ate some ham this morning and then started having hives, swelling to his ankles/arms, with wheezing. Someone drove him to the EMS station. They gave 10 mg albuterol, 0.5 atrovent, 50 PO benadryl, 125 solumedrol, epi pen. Initial BP 160/90, dropped to 130/80. Pt is a x 4, in NAD. Airway intact.

## 2016-07-11 NOTE — ED Provider Notes (Signed)
[facility] Provider Note   CSN: [id number] Arrival date & time: 07/11/16  1153     History   Chief Complaint Chief Complaint  Patient presents with  . Allergic Reaction    HPI Verdun Rackley is a 56 y.o. male.  The history is provided by the patient and medical records. No language interpreter was used.     56 year old male with prior episodes of allergic reaction presents today with findings concerning for anaphylaxis. Patient states that earlier this afternoon, he was cooking some [place] grill when he began to have hives throughout his body. Also states he has shortness of breath as well. Brother to come to the fire department and EMS notes that he had wheezing, drop in blood pressure from systolics of 180s to 130s, and hives. Treatments prior to arrival include: Solumedrol IV, Albuterol, Benadryl, Epipen. Patient states symptoms have significantly improved. States he has had similar symptoms in the past, is unsure of what exactly is causing this reaction, but thinks it may have something to do with the grill or meat that he cooks with. Has not seen allergist in the past.   Also brings up concern about chronic swelling in his lower extremities. Has not seen doctor for this. States its bilateral and has taken his family member's lasix in the past.   Past Medical History:  Diagnosis Date  . Hypertension     There are no active problems to display for this patient.   History reviewed. No pertinent surgical history.     Home Medications    Prior to Admission medications   Medication Sig Start Date End Date Taking? Authorizing Provider  Aspirin-Acetaminophen (GOODYS BODY PAIN PO) Take 1 packet by mouth daily as needed (for aches/pains (back)).    Historical Provider, MD  diphenhydrAMINE (BENADRYL) 25 MG tablet Take 1 tablet (25 mg total) by mouth every 6 (six) hours as needed. 08/04/15   Stevi Barrett, PA-C  diphenhydrAMINE (BENADRYL) 25 MG tablet Take 1 tablet (25 mg  total) by mouth every 6 (six) hours. 07/11/16 07/14/16  Madolyn Frieze, MD  EPINEPHrine 0.3 mg/0.3 mL IJ SOAJ injection Inject 0.3 mLs (0.3 mg total) into the muscle once. 08/04/15   Stevi Barrett, PA-C  EPINEPHrine 0.3 mg/0.3 mL IJ SOAJ injection Inject 0.3 mLs (0.3 mg total) into the muscle once. 12/29/15   Blane Ohara, MD  EPINEPHrine 0.3 mg/0.3 mL IJ SOAJ injection Inject 0.3 mLs (0.3 mg total) into the muscle once. 07/11/16 07/11/16  Madolyn Frieze, MD  predniSONE (DELTASONE) 10 MG tablet Take 2 tablets (20 mg total) by mouth daily. 07/12/16 07/15/16  Madolyn Frieze, MD  predniSONE (DELTASONE) 50 MG tablet Take 1 tablet once a day for 5 days Patient not taking: Reported on 12/29/2015 08/04/15   Rolm Gala Barrett, PA-C  ranitidine (ZANTAC) 150 MG tablet Take 1 tablet (150 mg total) by mouth 2 (two) times daily. 07/11/16 07/14/16  Madolyn Frieze, MD    Family History No family history on file.  Social History Social History  Substance Use Topics  . Smoking status: Current Every Day Smoker  . Smokeless tobacco: Not on file  . Alcohol use Yes     Allergies   Other   Review of Systems Review of Systems  Constitutional: Negative for chills and fever.  HENT: Positive for congestion. Negative for ear pain, facial swelling and sore throat.   Respiratory: Positive for shortness of breath and wheezing. Negative for cough.   Cardiovascular: Positive for leg swelling (chronic, not worsened  today). Negative for chest pain and palpitations.  Gastrointestinal: Negative for abdominal pain and vomiting.  Genitourinary: Negative for dysuria and hematuria.  Musculoskeletal: Negative for arthralgias and back pain.  Skin: Positive for rash. Negative for color change.  Neurological: Negative for seizures and syncope.  All other systems reviewed and are negative.    Physical Exam Updated Vital Signs BP 159/77   Pulse 102   Temp 98.4 F (36.9 C) (Oral)   Resp 23   Ht 5\' 8"  (1.727 m)   Wt 136.1 kg   SpO2 (!) 88%    BMI 45.61 kg/m   Physical Exam  Constitutional: He appears well-developed and well-nourished. No distress.  HENT:  Head: Normocephalic and atraumatic.  Eyes: Conjunctivae and EOM are normal.  Neck: Normal range of motion. Neck supple.  Cardiovascular: Normal rate and regular rhythm.   No murmur heard. Pulmonary/Chest: Effort normal and breath sounds normal. No respiratory distress. He has no wheezes. He has no rales.  Abdominal: Soft. There is no tenderness.  Musculoskeletal: He exhibits edema (non-pitting bilateral lower extremity edema).  Neurological: He is alert.  Skin: Skin is warm and dry. No rash noted. He is not diaphoretic.  Psychiatric: He has a normal mood and affect.  Nursing note and vitals reviewed.    ED Treatments / Results  Labs (all labs ordered are listed, but only abnormal results are displayed) Labs Reviewed - No data to display  EKG  EKG Interpretation None       Radiology Dg Chest Portable 1 View  Result Date: 07/11/2016 CLINICAL DATA:  Shortness of breath and hives this morning, swelling in ankles and arms, wheezing, history hypertension, smoker, ate some ham this morning EXAM: PORTABLE CHEST 1 VIEW COMPARISON:  Portable exam 1239 hours compared to 12/29/2015 FINDINGS: Upper normal heart size. Borderline mild pulmonary vascular congestion. Lungs clear. No pleural effusion or pneumothorax. 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 IMPRESSION: No acute abnormalities. Electronically Signed   By: Ulyses Southward M.D.   On: 07/11/2016 12:56    Procedures Procedures (including critical care time)  Medications Ordered in ED Medications  furosemide (LASIX) tablet 20 mg (20 mg Oral Given 07/11/16 1224)  famotidine (PEPCID) tablet 20 mg (20 mg Oral Given 07/11/16 1224)     Initial Impression / Assessment and Plan / ED Course  I have reviewed the triage vital signs and the nursing notes.  Pertinent labs & imaging results  that were available during my care of the patient were reviewed by me and considered in my medical decision making (see chart for details).  Clinical Course     56 year old male presents today with concern for allergic reaction. His exposure is unclear at this point. Given that he's had this episode multiple times, feel that follow up with an allergist would be appropriate for possible allergy testing. Patient was given the number to an allergist in town that he can follow-up with. Additionally patient does not have an primary care doctor. Given the number to primary care within our system. Advised follow-up especially if he would like to be on diuresis. Patient is agreeable with this. We'll discharge the patient home on H1/H2 blockers, steroids, refill for EpiPen.  Nursing did have concerns that the patient's oxygen saturation was 88% on room air 1 point. Patient was in no acute distress and chest x-ray is negative. On reevaluation the patient O2 saturation is 94-95% on room air. Patient is breathing without any difficulty. Feel  that no further workup is indicated. He was discharged home in good condition.  Final Clinical Impressions(s) / ED Diagnoses   Final diagnoses:  Allergic reaction, initial encounter    New Prescriptions New Prescriptions   DIPHENHYDRAMINE (BENADRYL) 25 MG TABLET    Take 1 tablet (25 mg total) by mouth every 6 (six) hours.   EPINEPHRINE 0.3 MG/0.3 ML IJ SOAJ INJECTION    Inject 0.3 mLs (0.3 mg total) into the muscle once.   PREDNISONE (DELTASONE) 10 MG TABLET    Take 2 tablets (20 mg total) by mouth daily.   RANITIDINE (ZANTAC) 150 MG TABLET    Take 1 tablet (150 mg total) by mouth 2 (two) times daily.     Madolyn FriezeVijay Eri Platten, MD 07/11/16 1455    Melene Planan Floyd, DO 07/11/16 1458

## 2016-12-06 DIAGNOSIS — R739 Hyperglycemia, unspecified: Secondary | ICD-10-CM

## 2016-12-06 HISTORY — DX: Hyperglycemia, unspecified: R73.9

## 2016-12-15 ENCOUNTER — Encounter (HOSPITAL_COMMUNITY): Payer: Self-pay

## 2016-12-15 ENCOUNTER — Observation Stay (HOSPITAL_COMMUNITY)
Admission: EM | Admit: 2016-12-15 | Discharge: 2016-12-16 | Disposition: A | Discharge status: Home / Self Care | Payer: Self-pay | Attending: Internal Medicine | Admitting: Internal Medicine

## 2016-12-15 DIAGNOSIS — Z6841 Body Mass Index (BMI) 40.0 and over, adult: Secondary | ICD-10-CM | POA: Insufficient documentation

## 2016-12-15 DIAGNOSIS — R0602 Shortness of breath: Secondary | ICD-10-CM

## 2016-12-15 DIAGNOSIS — F17210 Nicotine dependence, cigarettes, uncomplicated: Secondary | ICD-10-CM | POA: Insufficient documentation

## 2016-12-15 DIAGNOSIS — X58XXXA Exposure to other specified factors, initial encounter: Secondary | ICD-10-CM | POA: Insufficient documentation

## 2016-12-15 DIAGNOSIS — L02219 Cutaneous abscess of trunk, unspecified: Secondary | ICD-10-CM | POA: Diagnosis present

## 2016-12-15 DIAGNOSIS — I10 Essential (primary) hypertension: Secondary | ICD-10-CM | POA: Insufficient documentation

## 2016-12-15 DIAGNOSIS — Z720 Tobacco use: Secondary | ICD-10-CM | POA: Diagnosis present

## 2016-12-15 DIAGNOSIS — R0902 Hypoxemia: Secondary | ICD-10-CM | POA: Insufficient documentation

## 2016-12-15 DIAGNOSIS — Z9981 Dependence on supplemental oxygen: Secondary | ICD-10-CM | POA: Insufficient documentation

## 2016-12-15 DIAGNOSIS — D751 Secondary polycythemia: Secondary | ICD-10-CM | POA: Diagnosis present

## 2016-12-15 DIAGNOSIS — L03319 Cellulitis of trunk, unspecified: Secondary | ICD-10-CM | POA: Insufficient documentation

## 2016-12-15 DIAGNOSIS — T7840XA Allergy, unspecified, initial encounter: Principal | ICD-10-CM | POA: Diagnosis present

## 2016-12-15 DIAGNOSIS — J9611 Chronic respiratory failure with hypoxia: Secondary | ICD-10-CM | POA: Diagnosis present

## 2016-12-15 HISTORY — DX: Allergy, unspecified, initial encounter: T78.40XA

## 2016-12-15 HISTORY — DX: Hyperglycemia, unspecified: R73.9

## 2016-12-15 MED ORDER — EPINEPHRINE 0.3 MG/0.3ML IJ SOAJ
INTRAMUSCULAR | Status: AC
  Filled 2016-12-15: qty 0.3

## 2016-12-15 MED ORDER — METHYLPREDNISOLONE SODIUM SUCC 125 MG IJ SOLR
125.0000 mg | Freq: Once | INTRAMUSCULAR | Status: AC
  Administered 2016-12-15: 125 mg via INTRAVENOUS
  Filled 2016-12-15: qty 2

## 2016-12-15 MED ORDER — ALBUTEROL SULFATE (2.5 MG/3ML) 0.083% IN NEBU
INHALATION_SOLUTION | RESPIRATORY_TRACT | Status: AC
  Filled 2016-12-15: qty 6

## 2016-12-15 MED ORDER — DIPHENHYDRAMINE HCL 50 MG/ML IJ SOLN
25.0000 mg | Freq: Once | INTRAMUSCULAR | Status: AC
  Administered 2016-12-15: 25 mg via INTRAVENOUS
  Filled 2016-12-15: qty 1

## 2016-12-15 NOTE — ED Provider Notes (Signed)
[facility] Provider Note   CSN: [id number] Arrival date & time: 12/15/16  2224  By signing my name below, I, Diona BrownerJennifer Gorman, attest that this documentation has been prepared under the direction and in the presence of Vendela Troung, Cindee Saltourteney Lyn, MD. Electronically Signed: Diona BrownerJennifer Gorman, ED Scribe. 12/15/16. 11:35 PM.  History   Chief Complaint Chief Complaint  Patient presents with  . Allergic Reaction    HPI Bryan Mitchell is a 56 y.o. male with a PMHx of HTN who presents to the Emergency Department complaining of a sudden allergic reaction that started at 9 pm tonight. Associated sx include hives, itching all over, mild cough. SOB at baseline. Took 25 mg benadryl PO PTA.  According to prior notes pt has had hypoxia in the past likely secondary to obesity. Pt is unsure what caused the reaction.      The history is provided by the patient. No language interpreter was used.    Past Medical History:  Diagnosis Date  . Hypertension     There are no active problems to display for this patient.   History reviewed. No pertinent surgical history.     Home Medications    Prior to Admission medications   Medication Sig Start Date End Date Taking? Authorizing Provider  Aspirin-Acetaminophen (GOODYS BODY PAIN PO) Take 1 packet by mouth daily as needed (for aches/pains (back)).    [provider]  diphenhydrAMINE (BENADRYL) 25 MG tablet Take 1 tablet (25 mg total) by mouth every 6 (six) hours as needed. 08/04/15   Barrett, Rolm GalaStevi, PA-C  diphenhydrAMINE (BENADRYL) 25 MG tablet Take 1 tablet (25 mg total) by mouth every 6 (six) hours. 07/11/16 07/14/16  Madolyn FriezeNagpal, Vijay, MD  EPINEPHrine 0.3 mg/0.3 mL IJ SOAJ injection Inject 0.3 mLs (0.3 mg total) into the muscle once. 08/04/15   Barrett, Rolm GalaStevi, PA-C  EPINEPHrine 0.3 mg/0.3 mL IJ SOAJ injection Inject 0.3 mLs (0.3 mg total) into the muscle once. 12/29/15   Blane OharaZavitz, Joshua, MD  predniSONE (DELTASONE) 50 MG tablet Take 1 tablet once  a day for 5 days Patient not taking: Reported on 12/29/2015 08/04/15   Barrett, Rolm GalaStevi, PA-C  ranitidine (ZANTAC) 150 MG tablet Take 1 tablet (150 mg total) by mouth 2 (two) times daily. 07/11/16 07/14/16  Madolyn FriezeNagpal, Vijay, MD    Family History History reviewed. No pertinent family history.  Social History Social History  Substance Use Topics  . Smoking status: Current Every Day Smoker  . Smokeless tobacco: Not on file  . Alcohol use Yes     Allergies   Other   Review of Systems Review of Systems  HENT: Negative for facial swelling.   Skin: Positive for rash.  All other systems reviewed and are negative.    Physical Exam Updated Vital Signs BP (!) 141/77   Pulse 97   Temp 98.6 F (37 C) (Oral)   Resp (!) 21   Ht 5\' 8"  (1.727 m)   Wt (!) 320 lb (145.2 kg)   SpO2 95%   BMI 48.66 kg/m   Physical Exam  Constitutional: He is oriented to person, place, and time. He appears well-developed and well-nourished.  Obese.  HENT:  Head: Normocephalic.  No mouth, lip, or tongue edema.  Eyes: EOM are normal.  Neck: Normal range of motion.  Pulmonary/Chest: Effort normal. He has wheezes.  9 by 11 cm area of erythema indurated center with mild fluctuance.  Abdominal: He exhibits no distension.  Musculoskeletal: Normal range of motion.  Neurological: He is alert  and oriented to person, place, and time.  Skin:  Diffuse urticarious.  Psychiatric: He has a normal mood and affect.  Nursing note and vitals reviewed.    ED Treatments / Results  DIAGNOSTIC STUDIES: Oxygen Saturation is 95% on RA, adequate by my interpretation.   COORDINATION OF CARE: 11:35 PM-Discussed next steps with pt. Pt verbalized understanding and is agreeable with the plan.   Labs (all labs ordered are listed, but only abnormal results are displayed) Labs Reviewed - No data to display  EKG  EKG Interpretation None       Radiology No results found.  Procedures Procedures (including critical  care time)  Medications Ordered in ED Medications  EPINEPHrine (EPI-PEN) 0.3 mg/0.3 mL injection (not administered)  albuterol (PROVENTIL) (2.5 MG/3ML) 0.083% nebulizer solution (not administered)  diphenhydrAMINE (BENADRYL) injection 25 mg (25 mg Intravenous Given 12/15/16 2306)  methylPREDNISolone sodium succinate (SOLU-MEDROL) 125 mg/2 mL injection 125 mg (125 mg Intravenous Given 12/15/16 2306)     Initial Impression / Assessment and Plan / ED Course  I have reviewed the triage vital signs and the nursing notes.  Pertinent labs & imaging results that were available during my care of the patient were reviewed by me and considered in my medical decision making (see chart for details).    I personally performed the services described in this documentation, which was scribed in my presence. The recorded information has been reviewed and is accurate.   Patient is a 56 year old male with history of obesity, presenting today with allergic reaction. Patient found to be hypoxic. Patient initially had wheezing. The wheezing resolved. However patient continues to be hypoxic he did stop a 78% on room air. Patient on 6 L satting about 92%. Patient is uninsured and therefore feels very reluctant to stay in the hospital. I'm concerned about discharging the patient home. I discussed extensively with both patient and his sister.  We'll get CT PE.   1:48 AM Pt doesn't want to stay and will discuss further.     Discussed with medicine physician. To see whether we could discharge patient home with oxygen from the emergency department without insurance.  We discussed this with patient that he needs oxygen and so the options are admission versus discharge home in the morning with paying for oxygen out of pocket. We will admit to medicine at this point. It is unclear to what is causing patient's oxygen requirement. Could be that patient has been baseline hypoxia secondary to body habitus vs COPD  exacerbation.  CRITICAL CARE Performed by: Arlana Hove Total critical care time: 75 minutes Critical care time was exclusive of separately billable procedures and treating other patients. Critical care was necessary to treat or prevent imminent or life-threatening deterioration. Critical care was time spent personally by me on the following activities: development of treatment plan with patient and/or surrogate as well as nursing, discussions with consultants, evaluation of patient's response to treatment, examination of patient, obtaining history from patient or surrogate, ordering and performing treatments and interventions, ordering and review of laboratory studies, ordering and review of radiographic studies, pulse oximetry and re-evaluation of patient's condition.   I personally performed the services described in this documentation, which was scribed in my presence. The recorded information has been reviewed and is accurate.    INCISION AND DRAINAGE Performed by: Arlana Hove Consent: Verbal consent obtained. Risks and benefits: risks, benefits and alternatives were discussed Type: abscess  Body area: back  Anesthesia: local infiltration  Incision was  made with a scalpel.  Local anesthetic: lidocaine 1% w epinephrine  Anesthetic total:36ml  Complexity: complex Blunt dissection to break up loculations  Drainage: purulent  Drainage amount: copious purulent   Packing material: 1/4 in iodoform gauze  Patient tolerance: Patient tolerated the procedure well with no immediate complications.      Final Clinical Impressions(s) / ED Diagnoses   Final diagnoses:  None    New Prescriptions New Prescriptions   No medications on file     Abelino Derrick, MD 12/19/16 [phone]

## 2016-12-15 NOTE — ED Triage Notes (Signed)
Pt complaining of itching and hives to groin and back. Pt states hx of allergic reaction. Pt states onset 2 hours ago. Pt states took 25mg  benadryl PO PTA. Pt complaining of itching across body, states upset stomach and oral swelling. Airway intact at triage.

## 2016-12-15 NOTE — ED Notes (Signed)
EDP at bedside, aware of O2 sats 82% on RA. Placed pt on 6L [place], sats at 93%

## 2016-12-16 ENCOUNTER — Encounter (HOSPITAL_COMMUNITY): Payer: Self-pay | Admitting: General Practice

## 2016-12-16 ENCOUNTER — Emergency Department (HOSPITAL_COMMUNITY): Payer: Self-pay

## 2016-12-16 DIAGNOSIS — L03319 Cellulitis of trunk, unspecified: Secondary | ICD-10-CM

## 2016-12-16 DIAGNOSIS — T7840XA Allergy, unspecified, initial encounter: Secondary | ICD-10-CM

## 2016-12-16 DIAGNOSIS — J9611 Chronic respiratory failure with hypoxia: Secondary | ICD-10-CM | POA: Diagnosis present

## 2016-12-16 DIAGNOSIS — Z720 Tobacco use: Secondary | ICD-10-CM | POA: Diagnosis present

## 2016-12-16 DIAGNOSIS — T7840XS Allergy, unspecified, sequela: Secondary | ICD-10-CM

## 2016-12-16 DIAGNOSIS — R0902 Hypoxemia: Secondary | ICD-10-CM

## 2016-12-16 DIAGNOSIS — D751 Secondary polycythemia: Secondary | ICD-10-CM | POA: Diagnosis present

## 2016-12-16 DIAGNOSIS — L02219 Cutaneous abscess of trunk, unspecified: Secondary | ICD-10-CM | POA: Diagnosis present

## 2016-12-16 LAB — CBC WITH DIFFERENTIAL/PLATELET
Basophils Absolute: 0 10*3/uL (ref 0.0–0.1)
Basophils Relative: 0 %
EOS ABS: 0.1 10*3/uL (ref 0.0–0.7)
Eosinophils Relative: 1 %
HCT: 53.2 % — ABNORMAL HIGH (ref 39.0–52.0)
Hemoglobin: 17.6 g/dL — ABNORMAL HIGH (ref 13.0–17.0)
LYMPHS PCT: 13 %
Lymphs Abs: 1.1 10*3/uL (ref 0.7–4.0)
MCH: 31.4 pg (ref 26.0–34.0)
MCHC: 33.1 g/dL (ref 30.0–36.0)
MCV: 95 fL (ref 78.0–100.0)
MONO ABS: 0.4 10*3/uL (ref 0.1–1.0)
Monocytes Relative: 5 %
NEUTROS ABS: 6.4 10*3/uL (ref 1.7–7.7)
NEUTROS PCT: 81 %
PLATELETS: 159 10*3/uL (ref 150–400)
RBC: 5.6 MIL/uL (ref 4.22–5.81)
RDW: 13.2 % (ref 11.5–15.5)
WBC: 8 10*3/uL (ref 4.0–10.5)

## 2016-12-16 LAB — COMPREHENSIVE METABOLIC PANEL
ALT: 25 U/L (ref 17–63)
ANION GAP: 7 (ref 5–15)
AST: 29 U/L (ref 15–41)
Albumin: 3.4 g/dL — ABNORMAL LOW (ref 3.5–5.0)
Alkaline Phosphatase: 85 U/L (ref 38–126)
BUN: 14 mg/dL (ref 6–20)
CHLORIDE: 103 mmol/L (ref 101–111)
CO2: 28 mmol/L (ref 22–32)
Calcium: 8.6 mg/dL — ABNORMAL LOW (ref 8.9–10.3)
Creatinine, Ser: 0.86 mg/dL (ref 0.61–1.24)
GFR calc non Af Amer: >60 mL/min (ref >60)
Glucose, Bld: 161 mg/dL — ABNORMAL HIGH (ref 65–99)
Potassium: 4.4 mmol/L (ref 3.5–5.1)
SODIUM: 138 mmol/L (ref 135–145)
Total Bilirubin: 0.5 mg/dL (ref 0.3–1.2)
Total Protein: 6.8 g/dL (ref 6.5–8.1)

## 2016-12-16 LAB — I-STAT VENOUS BLOOD GAS, ED
Acid-Base Excess: 3 mmol/L — ABNORMAL HIGH (ref 0.0–2.0)
Bicarbonate: 30.9 mmol/L — ABNORMAL HIGH (ref 20.0–28.0)
O2 SAT: 70 %
PCO2 VEN: 59.6 mmHg (ref 44.0–60.0)
PO2 VEN: 40 mmHg (ref 32.0–45.0)
Patient temperature: 37
TCO2: 33 mmol/L (ref 0–100)
pH, Ven: 7.324 (ref 7.250–7.430)

## 2016-12-16 LAB — GLUCOSE, CAPILLARY
Glucose-Capillary: 228 mg/dL — ABNORMAL HIGH (ref 65–99)
Glucose-Capillary: 287 mg/dL — ABNORMAL HIGH (ref 65–99)

## 2016-12-16 LAB — TSH: TSH: 0.732 u[IU]/mL (ref 0.350–4.500)

## 2016-12-16 LAB — CBG MONITORING, ED: Glucose-Capillary: 225 mg/dL — ABNORMAL HIGH (ref 65–99)

## 2016-12-16 MED ORDER — ONDANSETRON HCL 4 MG PO TABS: 4.0000 mg | ORAL_TABLET | Freq: Four times a day (QID) | ORAL | Status: DC | PRN

## 2016-12-16 MED ORDER — FAMOTIDINE 20 MG PO TABS: 20.0000 mg | ORAL_TABLET | Freq: Two times a day (BID) | ORAL | Status: DC

## 2016-12-16 MED ORDER — IPRATROPIUM BROMIDE 0.02 % IN SOLN: 0.5000 mg | RESPIRATORY_TRACT | Status: DC | PRN

## 2016-12-16 MED ORDER — FAMOTIDINE 20 MG PO TABS: 20.0000 mg | ORAL_TABLET | Freq: Two times a day (BID) | ORAL | 0 refills | Status: DC

## 2016-12-16 MED ORDER — ACETAMINOPHEN 325 MG PO TABS
650.0000 mg | ORAL_TABLET | Freq: Four times a day (QID) | ORAL | Status: DC | PRN
  Administered 2016-12-16: 650 mg via ORAL
  Filled 2016-12-16: qty 2

## 2016-12-16 MED ORDER — DIPHENHYDRAMINE HCL 25 MG PO TABS: 25.0000 mg | ORAL_TABLET | Freq: Four times a day (QID) | ORAL | 0 refills | Status: DC | PRN

## 2016-12-16 MED ORDER — INSULIN ASPART 100 UNIT/ML ~~LOC~~ SOLN
0.0000 [IU] | Freq: Three times a day (TID) | SUBCUTANEOUS | Status: DC
  Administered 2016-12-16: 5 [IU] via SUBCUTANEOUS
  Administered 2016-12-16: 8 [IU] via SUBCUTANEOUS

## 2016-12-16 MED ORDER — ACETAMINOPHEN 650 MG RE SUPP: 650.0000 mg | Freq: Four times a day (QID) | RECTAL | Status: DC | PRN

## 2016-12-16 MED ORDER — LIDOCAINE HCL (PF) 1 % IJ SOLN
5.0000 mL | Freq: Once | INTRAMUSCULAR | Status: AC
  Administered 2016-12-16: 5 mL via INTRADERMAL
  Filled 2016-12-16: qty 5

## 2016-12-16 MED ORDER — ENOXAPARIN SODIUM 40 MG/0.4ML ~~LOC~~ SOLN: 40.0000 mg | SUBCUTANEOUS | Status: DC

## 2016-12-16 MED ORDER — PREDNISONE 50 MG PO TABS
60.0000 mg | ORAL_TABLET | Freq: Every day | ORAL | Status: DC
  Administered 2016-12-16: 08:00:00 60 mg via ORAL
  Filled 2016-12-16: qty 1

## 2016-12-16 MED ORDER — ONDANSETRON HCL 4 MG/2ML IJ SOLN: 4.0000 mg | Freq: Four times a day (QID) | INTRAMUSCULAR | Status: DC | PRN

## 2016-12-16 MED ORDER — ALBUTEROL SULFATE (2.5 MG/3ML) 0.083% IN NEBU: 2.5000 mg | INHALATION_SOLUTION | RESPIRATORY_TRACT | Status: DC | PRN

## 2016-12-16 MED ORDER — SULFAMETHOXAZOLE-TRIMETHOPRIM 800-160 MG PO TABS
1.0000 | ORAL_TABLET | Freq: Once | ORAL | Status: AC
  Administered 2016-12-16: 1 via ORAL
  Filled 2016-12-16: qty 1

## 2016-12-16 MED ORDER — PREDNISONE 20 MG PO TABS: 60.0000 mg | ORAL_TABLET | Freq: Every day | ORAL | 0 refills | Status: DC

## 2016-12-16 MED ORDER — DIPHENHYDRAMINE HCL 25 MG PO CAPS: 25.0000 mg | ORAL_CAPSULE | Freq: Four times a day (QID) | ORAL | Status: DC | PRN

## 2016-12-16 MED ORDER — EPINEPHRINE 0.3 MG/0.3ML IJ SOAJ: 0.3000 mg | Freq: Once | INTRAMUSCULAR | 0 refills | Status: AC

## 2016-12-16 MED ORDER — IPRATROPIUM-ALBUTEROL 0.5-2.5 (3) MG/3ML IN SOLN
3.0000 mL | Freq: Four times a day (QID) | RESPIRATORY_TRACT | Status: DC
  Administered 2016-12-16: 3 mL via RESPIRATORY_TRACT
  Filled 2016-12-16: qty 3

## 2016-12-16 MED ORDER — OXYCODONE-ACETAMINOPHEN 5-325 MG PO TABS
1.0000 | ORAL_TABLET | Freq: Once | ORAL | Status: AC
  Administered 2016-12-16: 1 via ORAL
  Filled 2016-12-16: qty 1

## 2016-12-16 MED ORDER — IOPAMIDOL (ISOVUE-370) INJECTION 76%
INTRAVENOUS | Status: AC
  Administered 2016-12-16: 100 mL
  Filled 2016-12-16: qty 100

## 2016-12-16 MED ORDER — INSULIN ASPART 100 UNIT/ML ~~LOC~~ SOLN
0.0000 [IU] | Freq: Every day | SUBCUTANEOUS | Status: DC
Start: 2016-12-16 — End: 2016-12-16

## 2016-12-16 NOTE — Progress Notes (Signed)
Jiles CrockerJeffrey Guier to be D/C'd Home per MD order.  Discussed with the patient and all questions fully answered.  VSS, Skin clean, dry and intact without evidence of skin break down, no evidence of skin tears noted. IV catheter discontinued intact. Site without signs and symptoms of complications. Dressing and pressure applied.  An After Visit Summary was printed and given to the patient. Patient received prescription.  D/c education completed with patient/family including follow up instructions, medication list, d/c activities limitations if indicated, with other d/c instructions as indicated by MD - patient able to verbalize understanding, all questions fully answered.   Patient instructed to return to ED, call 911, or call MD for any changes in condition.   Patient escorted via WC, and D/C home via private auto.  Rock NephewLisbeth  Meganne Rita 12/16/2016 3:48 PM

## 2016-12-16 NOTE — Progress Notes (Signed)
SATURATION QUALIFICATIONS: (This note is used to comply with regulatory documentation for home oxygen)  Patient Saturations on Room Air at Rest = 90%  Patient Saturations on Room Air while Ambulating = 92% 

## 2016-12-16 NOTE — Discharge Summary (Signed)
Physician Discharge Summary  Bryan CrockerJeffrey Mitchell NGE:[id number] DOB: [birth year] DOA: 12/15/2016  PCP: Patient, No Pcp Per  Admit date: 12/15/2016 Discharge date: 12/16/2016   Recommendations for Outpatient Follow-Up:   1. Allergy referral  2. Tobacco cessation 3. Follow up HgbA1C   Discharge Diagnosis:   Principal Problem:   Allergic reaction Active Problems:   Hypoxia   Cellulitis and abscess of trunk   Tobacco abuse   Polycythemia   Discharge disposition:  Home  Discharge Condition: Improved.  Diet recommendation: Low sodium, heart healthy.  Carbohydrate-modified  Wound care: None.   History of Present Illness:   Bryan CrockerJeffrey Schulenburg is a 56 y.o. male with medical history significant of obesity, tobacco abuse, and allergic reactions; who presents with complaints of having a sudden allergic reaction started around 9 PM tonight. He reports eating two bologna sandwichs, then took some naproxen. Thereafter patient reported feeling like his stomach was upset which had never happened before so he took some Pepto-Bismol. Shortly thereafter patient reports breaking out in whelps all over his body and feeling as though his tongue was swelling. Associated symptoms include generalized fatigue, leg swelling, and shortness of breath on exertion. Patient tried taking Benadryl without relief of symptoms. Patient also notes complaints of a boil of the upper right hand side of his back. Patient has been seen in the emergency department multiple times in the past for similar, but has not followed up with an allergist and does not have a regular primary care provider with whom he sees. Patient reports being self-pay. They have not been able to find a clear cause and patient seems to think symptoms are secondary to eating red meat although he reports eating the same thing that the previous day.   Hospital Course by Problem:   Allergic reaction/ question angioedema secondary to naproxen: -suspect  naprosyn-- patient instructed to avoid NSAIDs - Prednisone 60 mg po daily x 5 days - Benadryl prn allergies/history - Pepcid bid x 5 days - needs to follow with allergy as an outpatient -epipen given  Hypoxia -resolved on steroids  Morbid Obesity: BMI 48.8 -encourage weight loss  Polycythemia: Patient presents with elevated hemoglobin 17.6 which has been present on past admissions. Patient's tobacco abuse is one possible cause for secondary polycythemia. - cessation and outpatient follow up  Cellulitis and abscess of the upper back: Acute status post I&D. Patient was given a one-time dose of Bactrim. -follow up outpatient  Tobacco abuse: Patient smokes approximately 1/3-1/2 a pack cigarettes per day on average. - Counseled on the need for cessation of tobacco.    Medical Consultants:    None.   Discharge Exam:   Vitals:   12/16/16 0719 12/16/16 0809  BP:  129/74  Pulse: 100 97  Resp: 20 18  Temp:  97.4 F (36.3 C)   Vitals:   12/16/16 0719 12/16/16 0809 12/16/16 0907 12/16/16 1331  BP:  129/74    Pulse: 100 97    Resp: 20 18    Temp:  97.4 F (36.3 C)    TempSrc:  Oral    SpO2: 91% 94% (!) 89% 90%  Weight:  (!) 145.2 kg (320 lb)    Height:  5\' 8"  (1.727 m)      Gen:  NAD-- off O2-- has to go home to take care of his dog-- discussed risks of this with patient   The results of significant diagnostics from this hospitalization (including imaging, microbiology, ancillary and laboratory) are listed below for reference.  Procedures and Diagnostic Studies:   Dg Chest 2 View  Result Date: 12/16/2016 CLINICAL DATA:  Chest pain with shortness of breath EXAM: CHEST  2 VIEW COMPARISON:  07/11/2016 FINDINGS: Mild linear atelectasis at the left base. No consolidation or effusion. Normal cardiomediastinal silhouette. No pneumothorax. IMPRESSION: Mild linear atelectasis at the left base. No acute infiltrate or edema. Electronically Signed   By: Jasmine Pang M.D.    On: 12/16/2016 01:03   Ct Angio Chest Pe W Or Wo Contrast  Result Date: 12/16/2016 CLINICAL DATA:  Acute onset of shortness of breath. Initial encounter. EXAM: CT ANGIOGRAPHY CHEST WITH CONTRAST TECHNIQUE: Multidetector CT imaging of the chest was performed using the standard protocol during bolus administration of intravenous contrast. Multiplanar CT image reconstructions and MIPs were obtained to evaluate the vascular anatomy. CONTRAST:  79 mL of Isovue 370 IV contrast COMPARISON:  Chest radiograph performed earlier today at 12:55 a.m. FINDINGS: Cardiovascular: There is no evidence of significant pulmonary embolus. Minimal calcification is seen along the aortic arch. The great vessels are grossly unremarkable. The heart remains normal in size. Mediastinum/Nodes: No mediastinal lymphadenopathy is seen. No pericardial effusion is identified. The visualized portions of the thyroid gland are unremarkable. No axillary lymphadenopathy is seen. Lungs/Pleura: Bibasilar atelectasis is noted. No pleural effusion or pneumothorax is seen. No mass is identified. Upper Abdomen: The visualized portions of the liver and spleen are unremarkable. Musculoskeletal: No acute osseous abnormalities are identified. The visualized musculature is unremarkable in appearance. Review of the MIP images confirms the above findings. IMPRESSION: 1. No evidence of significant pulmonary embolus. 2. Bibasilar atelectasis noted.  Lungs otherwise clear. Electronically Signed   By: Roanna Raider M.D.   On: 12/16/2016 04:16     Labs:   Basic Metabolic Panel:  Recent Labs Lab 12/16/16 0028  NA 138  K 4.4  CL 103  CO2 28  GLUCOSE 161*  BUN 14  CREATININE 0.86  CALCIUM 8.6*   GFR Estimated Creatinine Clearance: 137.6 mL/min (by C-G formula based on SCr of 0.86 mg/dL). Liver Function Tests:  Recent Labs Lab 12/16/16 0028  AST 29  ALT 25  ALKPHOS 85  BILITOT 0.5  PROT 6.8  ALBUMIN 3.4*   No results for input(s):  LIPASE, AMYLASE in the last 168 hours. No results for input(s): AMMONIA in the last 168 hours. Coagulation profile No results for input(s): INR, PROTIME in the last 168 hours.  CBC:  Recent Labs Lab 12/16/16 0028  WBC 8.0  NEUTROABS 6.4  HGB 17.6*  HCT 53.2*  MCV 95.0  PLT 159   Cardiac Enzymes: No results for input(s): CKTOTAL, CKMB, CKMBINDEX, TROPONINI in the last 168 hours. BNP: Invalid input(s): POCBNP CBG:  Recent Labs Lab 12/16/16 0601 12/16/16 0808 12/16/16 1239  GLUCAP 225* 228* 287*   D-Dimer No results for input(s): DDIMER in the last 72 hours. Hgb A1c No results for input(s): HGBA1C in the last 72 hours. Lipid Profile No results for input(s): CHOL, HDL, LDLCALC, TRIG, CHOLHDL, LDLDIRECT in the last 72 hours. Thyroid function studies  Recent Labs  12/16/16 0600  TSH 0.732   Anemia work up No results for input(s): VITAMINB12, FOLATE, FERRITIN, TIBC, IRON, RETICCTPCT in the last 72 hours. Microbiology No results found for this or any previous visit (from the past 240 hour(s)).   Discharge Instructions:   Discharge Instructions    Diet - low sodium heart healthy    Complete by:  As directed    Discharge instructions  Complete by:  As directed    Needs to establish with PCP and get referral to allergy -keep log of OTC meds/food when having reaction Stop smoking   Increase activity slowly    Complete by:  As directed      Allergies as of 12/16/2016      Reactions   Other Anaphylaxis   McCormick's Rub (Food)      Medication List    STOP taking these medications   GOODYS BODY PAIN PO   naproxen sodium 220 MG tablet Commonly known as:  ANAPROX     TAKE these medications   diphenhydrAMINE 25 MG tablet Commonly known as:  BENADRYL Take 1 tablet (25 mg total) by mouth every 6 (six) hours as needed for itching or allergies.   EPINEPHrine 0.3 mg/0.3 mL Soaj injection Commonly known as:  EPI-PEN Inject 0.3 mLs (0.3 mg total) into the  muscle once. What changed:  when to take this  reasons to take this   famotidine 20 MG tablet Commonly known as:  PEPCID Take 1 tablet (20 mg total) by mouth 2 (two) times daily.   predniSONE 20 MG tablet Commonly known as:  DELTASONE Take 3 tablets (60 mg total) by mouth daily with breakfast. Start taking on:  12/17/2016         Time coordinating discharge: 25 min  Signed:  Farooq Mitchell U Bryan Mitchell   [facility] 12/16/2016, 2:11 PM

## 2016-12-16 NOTE — ED Notes (Signed)
Pt's sister, Lupita LeashDonna, would like an update. She can reached at [phone].

## 2016-12-16 NOTE — H&P (Signed)
History and Physical    Bryan Mitchell ZOX:[id number] DOB: 24-Nov-1960 DOA: 12/15/2016  Referring MD/NP/PA: Dr. Lacretia Nicks PCP: Patient, No Pcp Per  Patient coming from: Home  Chief Complaint: Allergic reaction  HPI: Bryan Mitchell is a 56 y.o. male with medical history significant of obesity, tobacco abuse, and allergic reactions; who presents with complaints of having a sudden allergic reaction started around 9 PM tonight. He reports eating two bologna sandwichs, then took some naproxen. Thereafter patient reported feeling like his stomach was upset which had never happened before so he took some Pepto-Bismol. Shortly thereafter patient reports breaking out in whelps all over his body and feeling as though his tongue was swelling. Associated symptoms include generalized fatigue, leg swelling, and shortness of breath on exertion. Patient tried taking Benadryl without relief of symptoms. Patient also notes complaints of a boil of the upper right hand side of his back. Patient has been seen in the emergency department multiple times in the past for similar, but has not followed up with an allergist and does not have a regular primary care provider with whom he sees. Patient reports being self-pay. They have not been able to find a clear cause and patient seems to think symptoms are secondary to eating red meat although he reports eating the same thing that the previous day.    ED Course: Upon admission into the emergency department patient was seen to be afebrile, pulse 95-107, respirations up to 33, and O2 saturations as low as 78% on room air. Patient was initially given 125 mg of Solu-Medrol and Benadryl. The ED physician performed I&D of a boil of the upper side of his back for which patient was given 1 dose of Bactrim and oxycodone. TRH called as patient required 6 L of nasal cannula oxygen to maintain O2 sats to 92%. Patient with no previous history of  Review of Systems: As per HPI otherwise 10 point  review of systems negative.   Past Medical History:  Diagnosis Date  . Hypertension     History reviewed. No pertinent surgical history.   reports that he has been smoking.  He does not have any smokeless tobacco history on file. He reports that he drinks alcohol. He reports that he does not use drugs.  Allergies  Allergen Reactions  . Other Anaphylaxis    McCormick's Rub (Food)    History reviewed. No pertinent family history.  Prior to Admission medications   Medication Sig Start Date End Date Taking? Authorizing Provider  Aspirin-Acetaminophen (GOODYS BODY PAIN PO) Take 1 packet by mouth daily as needed (for aches/pains (back)).   Yes [provider]  diphenhydrAMINE (BENADRYL) 25 MG tablet Take 1 tablet (25 mg total) by mouth every 6 (six) hours as needed. Patient taking differently: Take 25 mg by mouth every 6 (six) hours as needed for itching or allergies.  08/04/15  Yes Barrett, Rolm Gala, PA-C  EPINEPHrine 0.3 mg/0.3 mL IJ SOAJ injection Inject 0.3 mLs (0.3 mg total) into the muscle once. Patient taking differently: Inject 0.3 mg into the muscle daily as needed (allergic reaction).  08/04/15  Yes Barrett, Rolm Gala, PA-C  naproxen sodium (ANAPROX) 220 MG tablet Take 440 mg by mouth daily as needed (pain).   Yes [provider]    Physical Exam:  Constitutional: Obese male in NAD, calm, comfortable Vitals:   12/16/16 0230 12/16/16 0245 12/16/16 0300 12/16/16 0315  BP: (!) 142/76 (!) 141/63 (!) 151/79 137/68  Pulse: (!) 101 (!) 103 (!) 103 (!) 103  Resp: (!) 9 17 18  (!) 25  Temp:      TempSrc:      SpO2: 93% 90% (!) 86% 91%  Weight:      Height:       Eyes: PERRL, lids and conjunctivae normal ENMT: Mucous membranes are moist. Posterior pharynx clear of any exudate or lesions.   Neck: normal, supple, no masses, no thyromegaly Respiratory: Mild expiratory wheezes appreciated. Currently on liters nasal cannula oxygen. Cardiovascular: Regular rate and rhythm,  no murmurs / rubs / gallops. 1+ pitting extremity edema. 2+ pedal pulses. No carotid bruits.  Abdomen: no tenderness, no masses palpated. No hepatosplenomegaly. Bowel sounds positive.  Musculoskeletal: no clubbing / cyanosis. No joint deformity upper and lower extremities. Good ROM, no contractures. Normal muscle tone.  Skin: Whole-body whelps noted with area of erythema of the upper right hand side the patient's back status post I&D. Neurologic: CN 2-12 grossly intact. Sensation intact, DTR normal. Strength 5/5 in all 4.  Psychiatric: Normal judgment and insight. Alert and oriented x 3. Normal mood.     Labs on Admission: I have personally reviewed following labs and imaging studies  CBC:  Recent Labs Lab 12/16/16 0028  WBC 8.0  NEUTROABS 6.4  HGB 17.6*  HCT 53.2*  MCV 95.0  PLT 159   Basic Metabolic Panel:  Recent Labs Lab 12/16/16 0028  NA 138  K 4.4  CL 103  CO2 28  GLUCOSE 161*  BUN 14  CREATININE 0.86  CALCIUM 8.6*   GFR: Estimated Creatinine Clearance: 137.6 mL/min (by C-G formula based on SCr of 0.86 mg/dL). Liver Function Tests:  Recent Labs Lab 12/16/16 0028  AST 29  ALT 25  ALKPHOS 85  BILITOT 0.5  PROT 6.8  ALBUMIN 3.4*   No results for input(s): LIPASE, AMYLASE in the last 168 hours. No results for input(s): AMMONIA in the last 168 hours. Coagulation Profile: No results for input(s): INR, PROTIME in the last 168 hours. Cardiac Enzymes: No results for input(s): CKTOTAL, CKMB, CKMBINDEX, TROPONINI in the last 168 hours. BNP (last 3 results) No results for input(s): PROBNP in the last 8760 hours. HbA1C: No results for input(s): HGBA1C in the last 72 hours. CBG: No results for input(s): GLUCAP in the last 168 hours. Lipid Profile: No results for input(s): CHOL, HDL, LDLCALC, TRIG, CHOLHDL, LDLDIRECT in the last 72 hours. Thyroid Function Tests: No results for input(s): TSH, T4TOTAL, FREET4, T3FREE, THYROIDAB in the last 72 hours. Anemia  Panel: No results for input(s): VITAMINB12, FOLATE, FERRITIN, TIBC, IRON, RETICCTPCT in the last 72 hours. Urine analysis: No results found for: COLORURINE, APPEARANCEUR, LABSPEC, PHURINE, GLUCOSEU, HGBUR, BILIRUBINUR, KETONESUR, PROTEINUR, UROBILINOGEN, NITRITE, LEUKOCYTESUR Sepsis Labs: No results found for this or any previous visit (from the past 240 hour(s)).   Radiological Exams on Admission: Dg Chest 2 View  Result Date: 12/16/2016 CLINICAL DATA:  Chest pain with shortness of breath EXAM: CHEST  2 VIEW COMPARISON:  07/11/2016 FINDINGS: Mild linear atelectasis at the left base. No consolidation or effusion. Normal cardiomediastinal silhouette. No pneumothorax. IMPRESSION: Mild linear atelectasis at the left base. No acute infiltrate or edema. Electronically Signed   By: Jasmine PangKim  Fujinaga M.D.   On: 12/16/2016 01:03   Ct Angio Chest Pe W Or Wo Contrast  Result Date: 12/16/2016 CLINICAL DATA:  Acute onset of shortness of breath. Initial encounter. EXAM: CT ANGIOGRAPHY CHEST WITH CONTRAST TECHNIQUE: Multidetector CT imaging of the chest was performed using the standard protocol during bolus administration of intravenous contrast.  Multiplanar CT image reconstructions and MIPs were obtained to evaluate the vascular anatomy. CONTRAST:  79 mL of Isovue 370 IV contrast COMPARISON:  Chest radiograph performed earlier today at 12:55 a.m. FINDINGS: Cardiovascular: There is no evidence of significant pulmonary embolus. Minimal calcification is seen along the aortic arch. The great vessels are grossly unremarkable. The heart remains normal in size. Mediastinum/Nodes: No mediastinal lymphadenopathy is seen. No pericardial effusion is identified. The visualized portions of the thyroid gland are unremarkable. No axillary lymphadenopathy is seen. Lungs/Pleura: Bibasilar atelectasis is noted. No pleural effusion or pneumothorax is seen. No mass is identified. Upper Abdomen: The visualized portions of the liver and  spleen are unremarkable. Musculoskeletal: No acute osseous abnormalities are identified. The visualized musculature is unremarkable in appearance. Review of the MIP images confirms the above findings. IMPRESSION: 1. No evidence of significant pulmonary embolus. 2. Bibasilar atelectasis noted.  Lungs otherwise clear. Electronically Signed   By: Roanna Raider M.D.   On: 12/16/2016 04:16    EKG: Independently reviewed. Sinus tachycardia  Assessment/Plan Allergic reaction/ question angioedema secondary to naproxen: Patient unsure of what likely caused allergic reaction. Given patient history question the possibility of naproxen triggering onset of symptoms - Admit to telemetry bed  - Prednisone 60 mg po daily - Benadryl prn allergies/history - Pepcid bid   Hypoxia/possible COPD exacerbation: Acute. Patient presents with saturations noted to be as low as 78% on room air. Patient requiring home O2 6 L nasal cannula oxygen while in the ED to maintain saturations to 92%. On the differential includes obesity hypoventilation syndrome. - Continuous pulse oximetry with nasal Oxygen and keep O2 sat greater than 90% - Steroids as seen above - DuoNeb's prn SOB/Wheezing  Morbid Obesity: BMI 48.8  Polycythemia: Patient presents with elevated hemoglobin 17.6 which has been present on past admissions. Patient's tobacco abuse is one possible cause for secondary polycythemia. - Continue to monitor  Cellulitis and abscess of the upper back: Acute status post I&D. Patient was given a one-time dose of Bactrim. - Determine to continue with Bactrim versus switch to another antibiotic  Tobacco abuse: Patient smokes approximately 1/3-1/2 a pack cigarettes per day on average. - Counseled on the need for cessation of tobacco.   DVT prophylaxis: lovenox Code Status: Full  Family Communication: No family present at bedside Disposition Plan: Likely discharge home once medically stable Consults called:  none Admission status: Observation  Clydie Braun MD [facility] Pager [phone]  If 7PM-7AM, please contact night-coverage [email or web address] Password [facility]  12/16/2016, 4:36 AM

## 2016-12-16 NOTE — Progress Notes (Signed)
Jiles CrockerJeffrey Mitchell is a 56 y.o. male patient admitted from ED awake, alert - oriented  X 4 - no acute distress noted.  VSS - Blood pressure 129/74, pulse 97, temperature 97.4 F (36.3 C), temperature source Oral, resp. rate 18, height 5\' 8"  (1.727 m), weight (!) 145.2 kg (320 lb), SpO2 (!) 89 %.    IV in place, occlusive dsg intact without redness.  Orientation to room, and floor completed with information packet given to patient/family.  Patient declined safety video at this time.  Admission INP armband ID verified with patient/family, and in place.   SR up x 2, fall assessment complete, with patient and family able to verbalize understanding of risk associated with falls, and verbalized understanding to call nsg before up out of bed.  Call light within reach, patient able to voice, and demonstrate understanding.  Skin, clean-dry- intact without evidence of bruising, or skin tears.   No evidence of skin break down noted on exam.     Will cont to eval and treat per MD orders.  Rock NephewLisbeth  Cosmo Tetreault, RN 12/16/2016 12:21 PM

## 2016-12-16 NOTE — ED Notes (Signed)
Admitting at bedside 

## 2016-12-17 LAB — HEMOGLOBIN A1C
Hgb A1c MFr Bld: 6.2 % — ABNORMAL HIGH (ref 4.8–5.6)
MEAN PLASMA GLUCOSE: 131 mg/dL

## 2017-07-07 ENCOUNTER — Other Ambulatory Visit: Payer: Self-pay

## 2017-07-07 ENCOUNTER — Emergency Department (HOSPITAL_COMMUNITY)
Admission: EM | Admit: 2017-07-07 | Discharge: 2017-07-07 | Disposition: A | Discharge status: Home / Self Care | Payer: Self-pay | Attending: Emergency Medicine | Admitting: Emergency Medicine

## 2017-07-07 ENCOUNTER — Encounter (HOSPITAL_COMMUNITY): Payer: Self-pay | Admitting: Emergency Medicine

## 2017-07-07 DIAGNOSIS — T7840XA Allergy, unspecified, initial encounter: Secondary | ICD-10-CM | POA: Insufficient documentation

## 2017-07-07 DIAGNOSIS — L299 Pruritus, unspecified: Secondary | ICD-10-CM | POA: Insufficient documentation

## 2017-07-07 DIAGNOSIS — R600 Localized edema: Secondary | ICD-10-CM | POA: Insufficient documentation

## 2017-07-07 DIAGNOSIS — F17210 Nicotine dependence, cigarettes, uncomplicated: Secondary | ICD-10-CM | POA: Insufficient documentation

## 2017-07-07 MED ORDER — PREDNISONE 20 MG PO TABS: 60.0000 mg | ORAL_TABLET | Freq: Every day | ORAL | 0 refills | Status: DC

## 2017-07-07 MED ORDER — FAMOTIDINE 20 MG PO TABS: 20.0000 mg | ORAL_TABLET | Freq: Two times a day (BID) | ORAL | 0 refills | Status: DC

## 2017-07-07 MED ORDER — FAMOTIDINE 20 MG PO TABS
40.0000 mg | ORAL_TABLET | Freq: Once | ORAL | Status: AC
  Administered 2017-07-07: 40 mg via ORAL
  Filled 2017-07-07: qty 2

## 2017-07-07 MED ORDER — PREDNISONE 20 MG PO TABS
60.0000 mg | ORAL_TABLET | Freq: Once | ORAL | Status: AC
Start: 2017-07-07 — End: 2017-07-07
  Administered 2017-07-07: 60 mg via ORAL
  Filled 2017-07-07: qty 3

## 2017-07-07 NOTE — ED Provider Notes (Signed)
TIME SEEN: 1:14 AM  CHIEF COMPLAINT: Allergic reaction  HPI: Patient is a 56 year old male with history of hypertension who presents to the emergency department with allergic reaction.  States that he ate squirrel around 7 PM last night.  He has eaten squirrel before and never had any issue.  States he woke up around 9 or 10 PM with diffuse itching, lip and tongue swelling, shortness of breath, hives.  States he took 50 mg of benadryl prior to arrival and states his symptoms are resolved.  He states he has had similar symptoms before and steroids have helped him previously.  He is asking for a prescription now and states he feels well and would like to go home.  Patient denies any new soaps, lotions, detergents, medications or foods.  ROS: See HPI Constitutional: no fever  Eyes: no drainage  ENT: no runny nose   Cardiovascular:  no chest pain  Resp: no SOB  GI: no vomiting GU: no dysuria Integumentary: no rash  Allergy: no hives  Musculoskeletal: no leg swelling  Neurological: no slurred speech ROS otherwise negative  PAST MEDICAL HISTORY/PAST SURGICAL HISTORY:  Past Medical History:  Diagnosis Date  . Allergic reaction 12/15/2016  . Hyperglycemia 12/2016  . Hypertension     MEDICATIONS:  Prior to Admission medications   Medication Sig Start Date End Date Taking? Authorizing Provider  famotidine (PEPCID) 20 MG tablet Take 1 tablet (20 mg total) by mouth 2 (two) times daily. 07/07/17   Ward, Layla MawKristen N, DO  predniSONE (DELTASONE) 20 MG tablet Take 3 tablets (60 mg total) by mouth daily. 07/07/17   Ward, Layla MawKristen N, DO    ALLERGIES:  Allergies  Allergen Reactions  . Other Anaphylaxis    McCormick's Rub (Food)    SOCIAL HISTORY:  Social History   Tobacco Use  . Smoking status: Current Every Day Smoker    Years: 40.00    Types: Cigars  . Smokeless tobacco: Never Used  Substance Use Topics  . Alcohol use: Yes    FAMILY HISTORY: No family history on file.  EXAM: BP  138/66 (BP Location: Right Arm)   Pulse 94   Temp 98 F (36.7 C) (Oral)   Resp 18   SpO2 95%  CONSTITUTIONAL: Alert and oriented and responds appropriately to questions. Well-appearing; well-nourished HEAD: Normocephalic EYES: Conjunctivae clear, pupils appear equal, EOMI ENT: normal nose; moist mucous membranes; No pharyngeal erythema or petechiae, no tonsillar hypertrophy or exudate, no uvular deviation, no unilateral swelling, no trismus or drooling, no muffled voice, normal phonation, no stridor, no dental caries present, no drainable dental abscess noted, no Ludwig's angina, tongue sits flat in the bottom of the mouth, no angioedema, no facial erythema or warmth, no facial swelling; no pain with movement of the neck. NECK: Supple, no meningismus, no nuchal rigidity, no LAD  CARD: RRR; S1 and S2 appreciated; no murmurs, no clicks, no rubs, no gallops RESP: Normal chest excursion without splinting or tachypnea; breath sounds clear and equal bilaterally; no wheezes, no rhonchi, no rales, no hypoxia or respiratory distress, speaking full sentences ABD/GI: Normal bowel sounds; non-distended; soft, non-tender, no rebound, no guarding, no peritoneal signs, no hepatosplenomegaly BACK:  The back appears normal and is non-tender to palpation, there is no CVA tenderness EXT: Normal ROM in all joints; non-tender to palpation; no edema; normal capillary refill; no cyanosis, no calf tenderness or swelling    SKIN: Normal color for age and race; warm; no rash, no urticaria, no blisters or desquamation  NEURO: Moves all extremities equally PSYCH: The patient's mood and manner are appropriate. Grooming and personal hygiene are appropriate.  MEDICAL DECISION MAKING: Patient here with lip and tongue swelling, hives, difficulty breathing.  He reports his symptoms have completely resolved after oral Benadryl at home and he is very eager for discharge.  He states he has had allergic reactions in the past and is  requesting a prescription of prednisone and Pepcid.  He declines IV medications at this time.  He does not look anaphylactic.  I do not appreciate any hives, lip or tongue swelling, wheezing.  No hypotension or hypoxia on my examination.  Patient will follow up with his primary care physician and I have also given him allergy specialist for follow-up.  He denies any new exposure.  ED PROGRESS: After discharge, it appears patient was documented to be slightly hypotensive and hypoxic.  These vitals were not communicated to me by the nursing staff.  I spoke to the nursing staff who states that she did not see these vitals herself but only validated them.  She states when she was in the room on discharge patient was not hypoxic and not hypotensive.  I do not feel patient was truly anaphylactic.  He does have an EpiPen at home and we discussed at length strict return precautions.   At this time, I do not feel there is any life-threatening condition present. I have reviewed and discussed all results (EKG, imaging, lab, urine as appropriate) and exam findings with patient/family. I have reviewed nursing notes and appropriate previous records.  I feel the patient is safe to be discharged home without further emergent workup and can continue workup as an outpatient as needed. Discussed usual and customary return precautions. Patient/family verbalize understanding and are comfortable with this plan.  Outpatient follow-up has been provided if needed. All questions have been answered.     Ward, Layla MawKristen N, DO 07/07/17 [phone]

## 2017-07-07 NOTE — Discharge Instructions (Signed)
Please take Benadryl 50 mg every 8 hours for the next 2 days and then after that every 8 hours as needed.

## 2017-07-07 NOTE — ED Triage Notes (Signed)
Pt states he ate squirrel around 9pm and then went to sleep.  Woke up approx 1 hour ago with itching all over, sob, swelling to lips, and hives to legs. Speaking in complete sentences.

## 2017-09-06 ENCOUNTER — Encounter (HOSPITAL_COMMUNITY): Payer: Self-pay | Admitting: Emergency Medicine

## 2017-09-06 DIAGNOSIS — I10 Essential (primary) hypertension: Secondary | ICD-10-CM | POA: Insufficient documentation

## 2017-09-06 DIAGNOSIS — R220 Localized swelling, mass and lump, head: Secondary | ICD-10-CM | POA: Insufficient documentation

## 2017-09-06 DIAGNOSIS — F17290 Nicotine dependence, other tobacco product, uncomplicated: Secondary | ICD-10-CM | POA: Insufficient documentation

## 2017-09-06 DIAGNOSIS — T781XXA Other adverse food reactions, not elsewhere classified, initial encounter: Secondary | ICD-10-CM | POA: Insufficient documentation

## 2017-09-06 NOTE — ED Triage Notes (Signed)
Reports eating beef stew tonight.  Has the alpha gal allergy.  Took 4 benadryl and tums. Has rash all over, itching, reports skin feeling like its crawling.  Has epi pen but has not used it.

## 2017-09-07 ENCOUNTER — Emergency Department (HOSPITAL_COMMUNITY)
Admission: EM | Admit: 2017-09-07 | Discharge: 2017-09-07 | Disposition: A | Discharge status: Home / Self Care | Payer: Self-pay | Attending: Emergency Medicine | Admitting: Emergency Medicine

## 2017-09-07 DIAGNOSIS — T7840XA Allergy, unspecified, initial encounter: Secondary | ICD-10-CM

## 2017-09-07 MED ORDER — DIPHENHYDRAMINE HCL 25 MG PO TABS: 25.0000 mg | ORAL_TABLET | Freq: Four times a day (QID) | ORAL | 0 refills | Status: DC

## 2017-09-07 MED ORDER — PREDNISONE 20 MG PO TABS: 40.0000 mg | ORAL_TABLET | Freq: Every day | ORAL | 0 refills | Status: AC

## 2017-09-07 MED ORDER — FAMOTIDINE IN NACL 20-0.9 MG/50ML-% IV SOLN
20.0000 mg | Freq: Once | INTRAVENOUS | Status: AC
  Administered 2017-09-07: 20 mg via INTRAVENOUS
  Filled 2017-09-07: qty 50

## 2017-09-07 MED ORDER — OXYCODONE-ACETAMINOPHEN 5-325 MG PO TABS
1.0000 | ORAL_TABLET | Freq: Once | ORAL | Status: AC
  Administered 2017-09-07: 1 via ORAL
  Filled 2017-09-07: qty 1

## 2017-09-07 MED ORDER — ACETAMINOPHEN 325 MG PO TABS
650.0000 mg | ORAL_TABLET | Freq: Once | ORAL | Status: AC
  Administered 2017-09-07: 650 mg via ORAL
  Filled 2017-09-07: qty 2

## 2017-09-07 MED ORDER — FAMOTIDINE 20 MG PO TABS: 20.0000 mg | ORAL_TABLET | Freq: Two times a day (BID) | ORAL | 0 refills | Status: DC

## 2017-09-07 MED ORDER — DIPHENHYDRAMINE HCL 50 MG/ML IJ SOLN
25.0000 mg | Freq: Once | INTRAMUSCULAR | Status: AC
  Administered 2017-09-07: 25 mg via INTRAVENOUS
  Filled 2017-09-07: qty 1

## 2017-09-07 MED ORDER — METHYLPREDNISOLONE SODIUM SUCC 125 MG IJ SOLR
125.0000 mg | Freq: Once | INTRAMUSCULAR | Status: AC
  Administered 2017-09-07: 125 mg via INTRAVENOUS
  Filled 2017-09-07: qty 2

## 2017-09-07 NOTE — ED Provider Notes (Signed)
[facility] EMERGENCY DEPARTMENT Provider Note   CSN: [id number] Arrival date & time: 09/06/17  2339     History   Chief Complaint Chief Complaint  Patient presents with  . Allergic Reaction    HPI Stepehn Mitchell is a 57 y.o. male.  HPI 57 year old male who presents to the emergency department with developed hives and itching and swelling of his upper lip. He feels much better at this time. He was seen in our quick look pathway and was treated with IV Pepcid, Benadryl, Solu-Medrol. Prior to arrival here taken forbidden girl toms. His of similar symptoms before. He thinks may be related to the ingestion of beef. At this time feels much better. No difficulty breathing or swallowing. Reports hives resolved. Symptoms  were moderate to severe.   Past Medical History:  Diagnosis Date  . Allergic reaction 12/15/2016  . Hyperglycemia 12/2016  . Hypertension     Patient Active Problem List   Diagnosis Date Noted  . Hypoxia 12/16/2016  . Cellulitis and abscess of trunk 12/16/2016  . Tobacco abuse 12/16/2016  . Allergic reaction 12/16/2016  . Polycythemia 12/16/2016    Past Surgical History:  Procedure Laterality Date  . KNEE SURGERY Right        Home Medications    Prior to Admission medications   Medication Sig Start Date End Date Taking? Authorizing Provider  diphenhydrAMINE (BENADRYL) 25 MG tablet Take 1 tablet (25 mg total) by mouth every 6 (six) hours. 09/07/17   Azalia Bilis, MD  famotidine (PEPCID) 20 MG tablet Take 1 tablet (20 mg total) by mouth 2 (two) times daily. 09/07/17   Azalia Bilis, MD  predniSONE (DELTASONE) 20 MG tablet Take 2 tablets (40 mg total) by mouth daily for 3 days. 09/07/17 09/10/17  Azalia Bilis, MD    Family History No family history on file.  Social History Social History   Tobacco Use  . Smoking status: Current Every Day Smoker    Years: 40.00    Types: Cigars  . Smokeless tobacco: Never Used  Substance Use Topics    . Alcohol use: Yes  . Drug use: No     Allergies   Other; Beef-derived products; and Pork-derived products   Review of Systems Review of Systems  All other systems reviewed and are negative.    Physical Exam Updated Vital Signs BP 135/73 (BP Location: Right Arm)   Pulse (!) 102   Temp 98.3 F (36.8 C) (Oral)   Resp 20   Ht 5\' 8"  (1.727 m)   Wt (!) 147.4 kg (325 lb)   SpO2 92%   BMI 49.42 kg/m   Physical Exam  Constitutional: He is oriented to person, place, and time. He appears well-developed and well-nourished.  HENT:  Head: Normocephalic and atraumatic.   Tongue is  normal size. Tolerating secretions. Oral airway patent. No stridor.  Eyes: EOM are normal.  Neck: Normal range of motion.  Cardiovascular: Normal rate and regular rhythm.  Pulmonary/Chest: Effort normal and breath sounds normal. No respiratory distress.  Abdominal: Soft. He exhibits no distension. There is no tenderness.  Musculoskeletal: Normal range of motion.  Neurological: He is alert and oriented to person, place, and time.  Skin: Skin is warm and dry. No rash noted.  Psychiatric: He has a normal mood and affect. Judgment normal.  Nursing note and vitals reviewed.    ED Treatments / Results  Labs (all labs ordered are listed, but only abnormal results are displayed) Labs Reviewed -  No data to display  EKG  EKG Interpretation None       Radiology No results found.  Procedures Procedures (including critical care time)  Medications Ordered in ED Medications  diphenhydrAMINE (BENADRYL) injection 25 mg (25 mg Intravenous Given 09/07/17 0017)  methylPREDNISolone sodium succinate (SOLU-MEDROL) 125 mg/2 mL injection 125 mg (125 mg Intravenous Given 09/07/17 0017)  famotidine (PEPCID) IVPB 20 mg premix (0 mg Intravenous Stopped 09/07/17 0047)  acetaminophen (TYLENOL) tablet 650 mg (650 mg Oral Given 09/07/17 0109)  oxyCODONE-acetaminophen (PERCOCET/ROXICET) 5-325 MG per tablet 1 tablet (1  tablet Oral Given 09/07/17 0108)     Initial Impression / Assessment and Plan / ED Course  I have reviewed the triage vital signs and the nursing notes.  Pertinent labs & imaging results that were available during my care of the patient were reviewed by me and considered in my medical decision making (see chart for details).     Home with standard meds. Resolution of symptoms. No lip swelling  Final Clinical Impressions(s) / ED Diagnoses   Final diagnoses:  Allergic reaction, initial encounter    ED Discharge Orders        Ordered    diphenhydrAMINE (BENADRYL) 25 MG tablet  Every 6 hours     09/07/17 0133    famotidine (PEPCID) 20 MG tablet  2 times daily     09/07/17 0133    predniSONE (DELTASONE) 20 MG tablet  Daily     09/07/17 0133       Azalia Bilisampos, Abhiraj Dozal, MD 09/07/17 [phone]

## 2017-09-07 NOTE — ED Notes (Signed)
Pt understood dc material. NAD noted. Scripts given at dc 

## 2017-09-07 NOTE — ED Provider Notes (Signed)
Patient placed in Quick Look pathway, seen and evaluated   Chief Complaint: allergic reaction  HPI:   57 y.o. male with alpha gal allergy presents to the ED with rash, itching and feeling of swelling of his throat. Patient has taken 4 benadryl and tums but symptoms have worsened.  ROS: skin: rash  Physical Exam:   Gen: No distress  Neuro: Awake and Alert  Skin: hives noted to back and legs  HEENT: throat wit erythema, swallowing saliva  without difficulty.   Resp: no shortness of breath  Focused Exam:    Initiation of care has begun. The patient has been counseled on the process, plan, and necessity for staying for the completion/evaluation, and the remainder of the medical screening examination    Janne Napoleoneese, Cyndel Griffey M, NP 09/07/17 0006    Margarita Grizzleay, Danielle, MD 09/07/17 [phone]

## 2018-01-12 ENCOUNTER — Emergency Department (HOSPITAL_COMMUNITY): Payer: Self-pay

## 2018-01-12 ENCOUNTER — Inpatient Hospital Stay (HOSPITAL_COMMUNITY)
Admission: EM | Admit: 2018-01-12 | Discharge: 2018-01-15 | DRG: 190 | Disposition: A | Discharge status: Home / Self Care | Payer: Self-pay | Attending: Internal Medicine | Admitting: Internal Medicine

## 2018-01-12 ENCOUNTER — Encounter (HOSPITAL_COMMUNITY): Payer: Self-pay | Admitting: Emergency Medicine

## 2018-01-12 ENCOUNTER — Other Ambulatory Visit: Payer: Self-pay

## 2018-01-12 DIAGNOSIS — G4733 Obstructive sleep apnea (adult) (pediatric): Secondary | ICD-10-CM | POA: Diagnosis present

## 2018-01-12 DIAGNOSIS — J9601 Acute respiratory failure with hypoxia: Secondary | ICD-10-CM | POA: Diagnosis present

## 2018-01-12 DIAGNOSIS — I509 Heart failure, unspecified: Secondary | ICD-10-CM | POA: Diagnosis present

## 2018-01-12 DIAGNOSIS — I959 Hypotension, unspecified: Secondary | ICD-10-CM | POA: Diagnosis present

## 2018-01-12 DIAGNOSIS — J441 Chronic obstructive pulmonary disease with (acute) exacerbation: Principal | ICD-10-CM | POA: Diagnosis present

## 2018-01-12 DIAGNOSIS — Z79899 Other long term (current) drug therapy: Secondary | ICD-10-CM

## 2018-01-12 DIAGNOSIS — Z720 Tobacco use: Secondary | ICD-10-CM | POA: Diagnosis present

## 2018-01-12 DIAGNOSIS — J9602 Acute respiratory failure with hypercapnia: Secondary | ICD-10-CM | POA: Diagnosis present

## 2018-01-12 DIAGNOSIS — I878 Other specified disorders of veins: Secondary | ICD-10-CM | POA: Diagnosis present

## 2018-01-12 DIAGNOSIS — E118 Type 2 diabetes mellitus with unspecified complications: Secondary | ICD-10-CM

## 2018-01-12 DIAGNOSIS — Z6841 Body Mass Index (BMI) 40.0 and over, adult: Secondary | ICD-10-CM

## 2018-01-12 DIAGNOSIS — E1165 Type 2 diabetes mellitus with hyperglycemia: Secondary | ICD-10-CM | POA: Diagnosis present

## 2018-01-12 DIAGNOSIS — R0902 Hypoxemia: Secondary | ICD-10-CM

## 2018-01-12 DIAGNOSIS — I110 Hypertensive heart disease with heart failure: Secondary | ICD-10-CM | POA: Diagnosis present

## 2018-01-12 DIAGNOSIS — R600 Localized edema: Secondary | ICD-10-CM

## 2018-01-12 DIAGNOSIS — I2781 Cor pulmonale (chronic): Secondary | ICD-10-CM | POA: Diagnosis present

## 2018-01-12 MED ORDER — ALBUTEROL SULFATE (2.5 MG/3ML) 0.083% IN NEBU
5.0000 mg | INHALATION_SOLUTION | Freq: Once | RESPIRATORY_TRACT | Status: AC
  Administered 2018-01-12: 5 mg via RESPIRATORY_TRACT
  Filled 2018-01-12: qty 6

## 2018-01-12 MED ORDER — ALBUTEROL SULFATE (2.5 MG/3ML) 0.083% IN NEBU: 5.0000 mg | INHALATION_SOLUTION | Freq: Once | RESPIRATORY_TRACT | Status: DC

## 2018-01-12 NOTE — ED Triage Notes (Signed)
Pt reports having increasing shortness of breath over the last few days. Pt also reports swelling to bilateral legs

## 2018-01-13 ENCOUNTER — Inpatient Hospital Stay (HOSPITAL_COMMUNITY): Payer: Self-pay

## 2018-01-13 ENCOUNTER — Encounter (HOSPITAL_COMMUNITY): Payer: Self-pay | Admitting: Emergency Medicine

## 2018-01-13 DIAGNOSIS — J9601 Acute respiratory failure with hypoxia: Secondary | ICD-10-CM | POA: Diagnosis present

## 2018-01-13 DIAGNOSIS — R609 Edema, unspecified: Secondary | ICD-10-CM

## 2018-01-13 LAB — CBC WITH DIFFERENTIAL/PLATELET
BASOS ABS: 0 10*3/uL (ref 0.0–0.1)
BASOS PCT: 0 %
Basophils Absolute: 0 10*3/uL (ref 0.0–0.1)
Basophils Relative: 0 %
EOS ABS: 0.1 10*3/uL (ref 0.0–0.7)
EOS PCT: 2 %
Eosinophils Absolute: 0.2 10*3/uL (ref 0.0–0.7)
Eosinophils Relative: 3 %
HCT: 56.8 % — ABNORMAL HIGH (ref 39.0–52.0)
HEMATOCRIT: 53.9 % — AB (ref 39.0–52.0)
HEMOGLOBIN: 17.1 g/dL — AB (ref 13.0–17.0)
Hemoglobin: 17.6 g/dL — ABNORMAL HIGH (ref 13.0–17.0)
LYMPHS PCT: 22 %
Lymphocytes Relative: 11 %
Lymphs Abs: 0.8 10*3/uL (ref 0.7–4.0)
Lymphs Abs: 1.5 10*3/uL (ref 0.7–4.0)
MCH: 31.8 pg (ref 26.0–34.0)
MCH: 32.1 pg (ref 26.0–34.0)
MCHC: 31 g/dL (ref 30.0–36.0)
MCHC: 31.7 g/dL (ref 30.0–36.0)
MCV: 102.7 fL — AB (ref 78.0–100.0)
MCV: 101.1 fL — AB (ref 78.0–100.0)
MONO ABS: 0.4 10*3/uL (ref 0.1–1.0)
MONOS PCT: 5 %
MONOS PCT: 9 %
Monocytes Absolute: 0.6 10*3/uL (ref 0.1–1.0)
NEUTROS ABS: 4.2 10*3/uL (ref 1.7–7.7)
NEUTROS PCT: 66 %
Neutro Abs: 6 10*3/uL (ref 1.7–7.7)
Neutrophils Relative %: 82 %
PLATELETS: 184 10*3/uL (ref 150–400)
Platelets: 203 10*3/uL (ref 150–400)
RBC: 5.33 MIL/uL (ref 4.22–5.81)
RBC: 5.53 MIL/uL (ref 4.22–5.81)
RDW: 14.3 % (ref 11.5–15.5)
RDW: 14.5 % (ref 11.5–15.5)
WBC: 6.5 10*3/uL (ref 4.0–10.5)
WBC: 7.4 10*3/uL (ref 4.0–10.5)

## 2018-01-13 LAB — IRON AND TIBC
Iron: 47 ug/dL (ref 45–182)
SATURATION RATIOS: 11 % — AB (ref 17.9–39.5)
TIBC: 434 ug/dL (ref 250–450)
UIBC: 387 ug/dL

## 2018-01-13 LAB — D-DIMER, QUANTITATIVE (NOT AT ARMC): D DIMER QUANT: 0.52 ug{FEU}/mL — AB (ref 0.00–0.50)

## 2018-01-13 LAB — BRAIN NATRIURETIC PEPTIDE: B NATRIURETIC PEPTIDE 5: 43.1 pg/mL (ref 0.0–100.0)

## 2018-01-13 LAB — TROPONIN I: Troponin I: <0.03 ng/mL (ref <0.03)

## 2018-01-13 LAB — FERRITIN: Ferritin: 34 ng/mL (ref 24–336)

## 2018-01-13 LAB — HIV ANTIBODY (ROUTINE TESTING W REFLEX): HIV Screen 4th Generation wRfx: NONREACTIVE

## 2018-01-13 LAB — HEPATIC FUNCTION PANEL
ALBUMIN: 3.6 g/dL (ref 3.5–5.0)
ALK PHOS: 93 U/L (ref 38–126)
ALT: 25 U/L (ref 0–44)
AST: 25 U/L (ref 15–41)
BILIRUBIN TOTAL: 0.4 mg/dL (ref 0.3–1.2)
Bilirubin, Direct: 0.2 mg/dL (ref 0.0–0.2)
Indirect Bilirubin: 0.2 mg/dL — ABNORMAL LOW (ref 0.3–0.9)
Total Protein: 7.4 g/dL (ref 6.5–8.1)

## 2018-01-13 LAB — I-STAT CHEM 8, ED
BUN: 10 mg/dL (ref 6–20)
CHLORIDE: 95 mmol/L — AB (ref 98–111)
Calcium, Ion: 1.04 mmol/L — ABNORMAL LOW (ref 1.15–1.40)
Creatinine, Ser: 0.8 mg/dL (ref 0.61–1.24)
GLUCOSE: 140 mg/dL — AB (ref 70–99)
HEMATOCRIT: 53 % — AB (ref 39.0–52.0)
Hemoglobin: 18 g/dL — ABNORMAL HIGH (ref 13.0–17.0)
POTASSIUM: 4.3 mmol/L (ref 3.5–5.1)
Sodium: 142 mmol/L (ref 135–145)
TCO2: 39 mmol/L — ABNORMAL HIGH (ref 22–32)

## 2018-01-13 LAB — I-STAT TROPONIN, ED: Troponin i, poc: 0.01 ng/mL (ref 0.00–0.08)

## 2018-01-13 LAB — BASIC METABOLIC PANEL
Anion gap: 10 (ref 5–15)
BUN: 10 mg/dL (ref 6–20)
CHLORIDE: 97 mmol/L — AB (ref 98–111)
CO2: 35 mmol/L — AB (ref 22–32)
CREATININE: 0.83 mg/dL (ref 0.61–1.24)
Calcium: 8.6 mg/dL — ABNORMAL LOW (ref 8.9–10.3)
GFR calc Af Amer: >60 mL/min (ref >60)
GFR calc non Af Amer: >60 mL/min (ref >60)
GLUCOSE: 201 mg/dL — AB (ref 70–99)
Potassium: 4.7 mmol/L (ref 3.5–5.1)
SODIUM: 142 mmol/L (ref 135–145)

## 2018-01-13 LAB — TSH: TSH: 1.792 u[IU]/mL (ref 0.350–4.500)

## 2018-01-13 LAB — MAGNESIUM: MAGNESIUM: 2.3 mg/dL (ref 1.7–2.4)

## 2018-01-13 LAB — RETICULOCYTES
RBC.: 5.34 MIL/uL (ref 4.22–5.81)
Retic Count, Absolute: 90.8 10*3/uL (ref 19.0–186.0)
Retic Ct Pct: 1.7 % (ref 0.4–3.1)

## 2018-01-13 LAB — VITAMIN B12: VITAMIN B 12: 383 pg/mL (ref 180–914)

## 2018-01-13 LAB — HEMOGLOBIN A1C
HEMOGLOBIN A1C: 7.3 % — AB (ref 4.8–5.6)
MEAN PLASMA GLUCOSE: 162.81 mg/dL

## 2018-01-13 LAB — FOLATE: FOLATE: 15.3 ng/mL (ref >5.9)

## 2018-01-13 MED ORDER — THIAMINE HCL 100 MG/ML IJ SOLN: 100.0000 mg | Freq: Every day | INTRAMUSCULAR | Status: DC

## 2018-01-13 MED ORDER — IOPAMIDOL (ISOVUE-370) INJECTION 76%
100.0000 mL | Freq: Once | INTRAVENOUS | Status: AC | PRN
  Administered 2018-01-13: 100 mL via INTRAVENOUS

## 2018-01-13 MED ORDER — LORAZEPAM 2 MG/ML IJ SOLN: 1.0000 mg | Freq: Four times a day (QID) | INTRAMUSCULAR | Status: DC | PRN

## 2018-01-13 MED ORDER — METHYLPREDNISOLONE SODIUM SUCC 125 MG IJ SOLR
125.0000 mg | Freq: Once | INTRAMUSCULAR | Status: AC
Start: 2018-01-13 — End: 2018-01-13
  Administered 2018-01-13: 125 mg via INTRAVENOUS
  Filled 2018-01-13: qty 2

## 2018-01-13 MED ORDER — IPRATROPIUM-ALBUTEROL 0.5-2.5 (3) MG/3ML IN SOLN
3.0000 mL | Freq: Four times a day (QID) | RESPIRATORY_TRACT | Status: DC
  Administered 2018-01-13: 3 mL via RESPIRATORY_TRACT
  Filled 2018-01-13: qty 3

## 2018-01-13 MED ORDER — IPRATROPIUM-ALBUTEROL 0.5-2.5 (3) MG/3ML IN SOLN
3.0000 mL | Freq: Two times a day (BID) | RESPIRATORY_TRACT | Status: DC
  Administered 2018-01-13: 3 mL via RESPIRATORY_TRACT
  Filled 2018-01-13: qty 3

## 2018-01-13 MED ORDER — ACETAMINOPHEN 650 MG RE SUPP: 650.0000 mg | Freq: Four times a day (QID) | RECTAL | Status: DC | PRN

## 2018-01-13 MED ORDER — FOLIC ACID 1 MG PO TABS
1.0000 mg | ORAL_TABLET | Freq: Every day | ORAL | Status: DC
  Administered 2018-01-13 – 2018-01-15 (×3): 1 mg via ORAL
  Filled 2018-01-13 (×3): qty 1

## 2018-01-13 MED ORDER — FONDAPARINUX SODIUM 2.5 MG/0.5ML ~~LOC~~ SOLN
2.5000 mg | Freq: Every day | SUBCUTANEOUS | Status: DC
  Administered 2018-01-13 – 2018-01-15 (×3): 2.5 mg via SUBCUTANEOUS
  Filled 2018-01-13 (×3): qty 0.5

## 2018-01-13 MED ORDER — ACETAMINOPHEN 325 MG PO TABS: 650.0000 mg | ORAL_TABLET | Freq: Four times a day (QID) | ORAL | Status: DC | PRN

## 2018-01-13 MED ORDER — PREDNISONE 20 MG PO TABS
40.0000 mg | ORAL_TABLET | Freq: Every day | ORAL | Status: DC
  Administered 2018-01-14: 40 mg via ORAL
  Filled 2018-01-13: qty 2

## 2018-01-13 MED ORDER — ALBUTEROL SULFATE (2.5 MG/3ML) 0.083% IN NEBU
5.0000 mg | INHALATION_SOLUTION | Freq: Once | RESPIRATORY_TRACT | Status: AC
  Administered 2018-01-13: 5 mg via RESPIRATORY_TRACT
  Filled 2018-01-13: qty 6

## 2018-01-13 MED ORDER — LORAZEPAM 1 MG PO TABS: 1.0000 mg | ORAL_TABLET | Freq: Four times a day (QID) | ORAL | Status: DC | PRN

## 2018-01-13 MED ORDER — IPRATROPIUM-ALBUTEROL 0.5-2.5 (3) MG/3ML IN SOLN: 3.0000 mL | Freq: Three times a day (TID) | RESPIRATORY_TRACT | Status: DC

## 2018-01-13 MED ORDER — IPRATROPIUM BROMIDE 0.02 % IN SOLN: 0.5000 mg | RESPIRATORY_TRACT | Status: DC

## 2018-01-13 MED ORDER — IOPAMIDOL (ISOVUE-370) INJECTION 76%
INTRAVENOUS | Status: AC
  Filled 2018-01-13: qty 100

## 2018-01-13 MED ORDER — ADULT MULTIVITAMIN W/MINERALS CH
1.0000 | ORAL_TABLET | Freq: Every day | ORAL | Status: DC
  Administered 2018-01-13 – 2018-01-15 (×3): 1 via ORAL
  Filled 2018-01-13 (×3): qty 1

## 2018-01-13 MED ORDER — NICOTINE 21 MG/24HR TD PT24
21.0000 mg | MEDICATED_PATCH | Freq: Every day | TRANSDERMAL | Status: DC
  Administered 2018-01-13 – 2018-01-15 (×3): 21 mg via TRANSDERMAL
  Filled 2018-01-13 (×3): qty 1

## 2018-01-13 MED ORDER — VITAMIN B-1 100 MG PO TABS
100.0000 mg | ORAL_TABLET | Freq: Every day | ORAL | Status: DC
  Administered 2018-01-13 – 2018-01-15 (×3): 100 mg via ORAL
  Filled 2018-01-13 (×3): qty 1

## 2018-01-13 MED ORDER — FUROSEMIDE 10 MG/ML IJ SOLN
40.0000 mg | Freq: Three times a day (TID) | INTRAMUSCULAR | Status: DC
  Administered 2018-01-13 – 2018-01-14 (×3): 40 mg via INTRAVENOUS
  Filled 2018-01-13 (×3): qty 4

## 2018-01-13 MED ORDER — ALBUTEROL SULFATE (2.5 MG/3ML) 0.083% IN NEBU
2.5000 mg | INHALATION_SOLUTION | RESPIRATORY_TRACT | Status: DC | PRN
  Administered 2018-01-14: 2.5 mg via RESPIRATORY_TRACT
  Filled 2018-01-13: qty 3

## 2018-01-13 MED ORDER — SODIUM CHLORIDE 0.9 % IV SOLN
INTRAVENOUS | Status: DC | PRN
  Administered 2018-01-13: 250 mL via INTRAVENOUS

## 2018-01-13 MED ORDER — ALBUTEROL SULFATE (2.5 MG/3ML) 0.083% IN NEBU: 2.5000 mg | INHALATION_SOLUTION | RESPIRATORY_TRACT | Status: DC

## 2018-01-13 NOTE — ED Provider Notes (Signed)
[facility]-EMERGENCY DEPT Provider Note   CSN: [id number] Arrival date & time: 01/12/18  2220     History   Chief Complaint Chief Complaint  Patient presents with  . Shortness of Breath    HPI Bryan Mitchell is a 57 y.o. male.  The history is provided by the patient. No language interpreter was used.  Shortness of Breath  This is a recurrent problem. The problem occurs continuously.The current episode started more than 1 week ago. The problem has not changed since onset.Associated symptoms include wheezing, leg pain and leg swelling. Pertinent negatives include no fever, no vomiting and no abdominal pain. It is unknown what precipitated the problem. Risk factors include recent prolonged sitting. He has tried nothing for the symptoms. The treatment provided no relief. He has had prior hospitalizations. He has had prior ED visits. He has had no prior ICU admissions. Associated medical issues do not include PE, DVT or recent surgery.    Past Medical History:  Diagnosis Date  . Allergic reaction 12/15/2016  . Hyperglycemia 12/2016  . Hypertension     Patient Active Problem List   Diagnosis Date Noted  . Hypoxia 12/16/2016  . Cellulitis and abscess of trunk 12/16/2016  . Tobacco abuse 12/16/2016  . Allergic reaction 12/16/2016  . Polycythemia 12/16/2016    Past Surgical History:  Procedure Laterality Date  . KNEE SURGERY Right         Home Medications    Prior to Admission medications   Medication Sig Start Date End Date Taking? Authorizing Provider  diphenhydrAMINE (BENADRYL) 25 MG tablet Take 1 tablet (25 mg total) by mouth every 6 (six) hours. Patient taking differently: Take 25 mg by mouth daily.  09/07/17  Yes Azalia Bilisampos, Kevin, MD  famotidine (PEPCID) 20 MG tablet Take 1 tablet (20 mg total) by mouth 2 (two) times daily. Patient not taking: Reported on 01/13/2018 09/07/17   Azalia Bilisampos, Kevin, MD    Family History History reviewed. No pertinent family  history.  Social History Social History   Tobacco Use  . Smoking status: Current Every Day Smoker    Years: 40.00    Types: Cigars  . Smokeless tobacco: Never Used  Substance Use Topics  . Alcohol use: Yes  . Drug use: No     Allergies   Other; Beef-derived products; and Pork-derived products   Review of Systems Review of Systems  Constitutional: Negative for diaphoresis and fever.  Respiratory: Positive for shortness of breath and wheezing.   Cardiovascular: Positive for leg swelling.  Gastrointestinal: Negative for abdominal pain, nausea and vomiting.  All other systems reviewed and are negative.    Physical Exam Updated Vital Signs BP 134/70 (BP Location: Right Arm)   Pulse 100   Resp (!) 23   Ht 5\' 8"  (1.727 m)   Wt (!) 160.2 kg (353 lb 3.2 oz)   SpO2 (!) 89%   BMI 53.70 kg/m   Physical Exam  Constitutional: He is oriented to person, place, and time. He appears well-developed and well-nourished. No distress.  HENT:  Head: Normocephalic and atraumatic.  Right Ear: External ear normal.  Left Ear: External ear normal.  Nose: Nose normal.  Mouth/Throat: No oropharyngeal exudate.  Eyes: Pupils are equal, round, and reactive to light. Conjunctivae are normal.  Neck: Normal range of motion. Neck supple.  Cardiovascular: Regular rhythm, normal heart sounds and intact distal pulses. Tachycardia present.  Pulmonary/Chest: Tachypnea noted. He has wheezes. He has no rales. He exhibits no tenderness.  Abdominal: Soft. Bowel sounds are normal. He exhibits no mass. There is no tenderness. There is no rebound and no guarding.  Musculoskeletal: He exhibits no deformity.  Lymphadenopathy:    He has no cervical adenopathy.  Neurological: He is alert and oriented to person, place, and time. He displays normal reflexes.  Skin: Skin is warm and dry. Capillary refill takes less than 2 seconds.  Psychiatric: He has a normal mood and affect.     ED Treatments / Results    Labs (all labs ordered are listed, but only abnormal results are displayed) Results for orders placed or performed during the hospital encounter of 01/12/18  CBC with Differential/Platelet  Result Value Ref Range   WBC 6.5 4.0 - 10.5 K/uL   RBC 5.33 4.22 - 5.81 MIL/uL   Hemoglobin 17.1 (H) 13.0 - 17.0 g/dL   HCT 16.1 (H) 09.6 - 04.5 %   MCV 101.1 (H) 78.0 - 100.0 fL   MCH 32.1 26.0 - 34.0 pg   MCHC 31.7 30.0 - 36.0 g/dL   RDW 40.9 81.1 - 91.4 %   Platelets 203 150 - 400 K/uL   Neutrophils Relative % 66 %   Neutro Abs 4.2 1.7 - 7.7 K/uL   Lymphocytes Relative 22 %   Lymphs Abs 1.5 0.7 - 4.0 K/uL   Monocytes Relative 9 %   Monocytes Absolute 0.6 0.1 - 1.0 K/uL   Eosinophils Relative 3 %   Eosinophils Absolute 0.2 0.0 - 0.7 K/uL   Basophils Relative 0 %   Basophils Absolute 0.0 0.0 - 0.1 K/uL  Brain natriuretic peptide  Result Value Ref Range   B Natriuretic Peptide 43.1 0.0 - 100.0 pg/mL  I-Stat Chem 8, ED  Result Value Ref Range   Sodium 142 135 - 145 mmol/L   Potassium 4.3 3.5 - 5.1 mmol/L   Chloride 95 (L) 98 - 111 mmol/L   BUN 10 6 - 20 mg/dL   Creatinine, Ser 7.82 0.61 - 1.24 mg/dL   Glucose, Bld 956 (H) 70 - 99 mg/dL   Calcium, Ion 2.13 (L) 1.15 - 1.40 mmol/L   TCO2 39 (H) 22 - 32 mmol/L   Hemoglobin 18.0 (H) 13.0 - 17.0 g/dL   HCT 08.6 (H) 57.8 - 46.9 %  I-stat troponin, ED  Result Value Ref Range   Troponin i, poc 0.01 0.00 - 0.08 ng/mL   Comment 3           Dg Chest 2 View  Result Date: 01/12/2018 CLINICAL DATA:  Increased dyspnea for a while with swelling of the legs. EXAM: CHEST - 2 VIEW COMPARISON:  12/16/2016 CXR and CT FINDINGS: Overlying cardiomegaly. Nonaneurysmal thoracic aorta. Pulmonary vascular congestion is noted. Both lungs are clear. The visualized skeletal structures are unremarkable. IMPRESSION: Mild pulmonary vascular congestion with borderline cardiomegaly. Electronically Signed   By: Tollie Eth M.D.   On: 01/12/2018 23:11    EKG EKG  Interpretation  Date/Time:  Monday January 12 2018 22:51:37 EDT Ventricular Rate:  100 PR Interval:    QRS Duration: 91 QT Interval:  344 QTC Calculation: 444 R Axis:   84 Text Interpretation:  Sinus tachycardia Confirmed by Travis Purk ([id number]) on 01/12/2018 11:20:51 PM   Radiology Dg Chest 2 View  Result Date: 01/12/2018 CLINICAL DATA:  Increased dyspnea for a while with swelling of the legs. EXAM: CHEST - 2 VIEW COMPARISON:  12/16/2016 CXR and CT FINDINGS: Overlying cardiomegaly. Nonaneurysmal thoracic aorta. Pulmonary vascular congestion is noted. Both lungs are  clear. The visualized skeletal structures are unremarkable. IMPRESSION: Mild pulmonary vascular congestion with borderline cardiomegaly. Electronically Signed   By: Tollie Eth M.D.   On: 01/12/2018 23:11    Procedures Procedures (including critical care time)  Medications Ordered in ED Medications  albuterol (PROVENTIL) (2.5 MG/3ML) 0.083% nebulizer solution 5 mg (5 mg Nebulization Not Given 01/12/18 2328)  albuterol (PROVENTIL) (2.5 MG/3ML) 0.083% nebulizer solution 5 mg (has no administration in time range)  methylPREDNISolone sodium succinate (SOLU-MEDROL) 125 mg/2 mL injection 125 mg (has no administration in time range)  albuterol (PROVENTIL) (2.5 MG/3ML) 0.083% nebulizer solution 5 mg (5 mg Nebulization Given 01/12/18 2321)       Final Clinical Impressions(s) / ED Diagnoses   Hypoxia and COPD exacerbation:  Based on girth patient is too large to fit through the CT gantry.     Kaileen Bronkema, MD 01/13/18 [phone]

## 2018-01-13 NOTE — Progress Notes (Signed)
Bryan Mitchell is a 57 y.o. male with history of hypertension who has not been to a doctor for many years and takes Lasix from his brother over the last 1 year for congestion and shortness of breath and lower extremity presents to the ER because of worsening lower extremity edema over the last few days despite taking his brothers Lasix, hypoxic on arrival requiring about 5 lit of [place] oxygen, looks flushed. His CXR shows pulmonary congestion, D dimer slightly elevated,. He was admitted for acute respiratory failure with hypoxia and hypercapnia probably secondary to a combination of acute CHF exacerbation, COPD exacerbation, OSA, obesity hyperventilation syndrome and possible evaluation of PE/DVT.   Assessment: 1. Acute respiratory failure with hypoxia and hypercapnia 2. Fluid overload.  3. Elevated D dimer.  4. Near syncope 5. Tobacco abuse 6. Hyperglycemia    PLan: 1. IV lasix and strict intake and output.  2. Venous duplex to rule out DVT.  3. CT angio of the chest to rule out PE 4. TELE monitor , Serial troponin's. 5. Echocardiogram.  6. Prednisone taper for COPD exacerbation.  7. [place] [facility], duo nebs.  8. Outpatient follow up with pulm for sleep study.  9. Case management for PCP and home health.  10. PT evaluation.    Bryan ModyVijaya Nyshawn Gowdy MD  [phone]

## 2018-01-13 NOTE — H&P (Signed)
History and Physical    Bryan CrockerJeffrey Donaldson ZOX:[id number] DOB: April 26, 1961 DOA: 01/12/2018  PCP: Patient, No Pcp Per  Patient coming from: Home.  Chief Complaint: Lower extremity edema.  Dizzy spell.  HPI: Bryan Mitchell is a 57 y.o. male with history of hypertension who has not been to a doctor for many years and takes Lasix from his brother over the last 1 year for congestion and shortness of breath and lower extremity presents to the ER because of worsening lower extremity edema over the last few days despite taking his brothers Lasix.  Patient also has feeling of congestion in his chest.  Denies any pain in particular.  Denies any cough or fever chills.  Patient yesterday also had a dizzy spell while when he stood up he felt like he was seeing objects or abstract.  Did not lose consciousness.  Not had any weakness of upper or lower extremities.  ED Course: In the ER on arrival patient was mildly hypotensive but improved after taking rest and lying down.  Patient was found to be wheezing and was placed on nebulizer treatment and steroids.  Chest x-ray was unremarkable d-dimer was mildly elevated.  On exam patient has bilateral lower extremity edema.  Patient admitted for further work-up of acute respiratory failure likely possibility of fluid overload and will not rule out any DVT or PE.  COPD also is a possibility.  Review of Systems: As per HPI, rest all negative.   Past Medical History:  Diagnosis Date  . Allergic reaction 12/15/2016  . Hyperglycemia 12/2016  . Hypertension     Past Surgical History:  Procedure Laterality Date  . KNEE SURGERY Right      reports that he has been smoking cigars.  He has smoked for the past 40.00 years. He has never used smokeless tobacco. He reports that he drinks alcohol. He reports that he does not use drugs.  Allergies  Allergen Reactions  . Other Anaphylaxis    McCormick's Rub (Food)  . Beef-Derived Products Itching    Alpha gal allergy  .  Pork-Derived Photographerroducts Hives    Alpha gal    Family History  Problem Relation Age of Onset  . CAD Father   . CAD Brother     Prior to Admission medications   Medication Sig Start Date End Date Taking? Authorizing Provider  diphenhydrAMINE (BENADRYL) 25 MG tablet Take 1 tablet (25 mg total) by mouth every 6 (six) hours. Patient taking differently: Take 25 mg by mouth daily.  09/07/17  Yes Azalia Bilisampos, Kevin, MD  famotidine (PEPCID) 20 MG tablet Take 1 tablet (20 mg total) by mouth 2 (two) times daily. Patient not taking: Reported on 01/13/2018 09/07/17   Azalia Bilisampos, Kevin, MD    Physical Exam: Vitals:   01/13/18 0130 01/13/18 0200 01/13/18 0317 01/13/18 0317  BP: (!) 104/57 107/62 128/81 128/81  Pulse: 94 (!) 103 100 100  Resp: (!) 21 (!) 22 (!) 24 (!) 24  Temp:   98.1 F (36.7 C) 98.1 F (36.7 C)  TempSrc:   Oral Oral  SpO2: 91% 92% 90% (!) 89%  Weight:   (!) 161.9 kg (356 lb 14.8 oz)   Height:   5\' 8"  (1.727 m)       Constitutional: Moderately built and nourished. Vitals:   01/13/18 0130 01/13/18 0200 01/13/18 0317 01/13/18 0317  BP: (!) 104/57 107/62 128/81 128/81  Pulse: 94 (!) 103 100 100  Resp: (!) 21 (!) 22 (!) 24 (!) 24  Temp:  98.1 F (36.7 C) 98.1 F (36.7 C)  TempSrc:   Oral Oral  SpO2: 91% 92% 90% (!) 89%  Weight:   (!) 161.9 kg (356 lb 14.8 oz)   Height:   5\' 8"  (1.727 m)    Eyes: Anicteric no pallor. ENMT: No discharge from the ears eyes nose or mouth. Neck: JVD elevated no mass felt. Respiratory: No rhonchi or crepitations. Cardiovascular: S1-S2 heard no murmurs appreciated. Abdomen: Soft nontender bowel sounds present. Musculoskeletal: Bilateral lower extremity edema present. Skin: No rash. Neurologic: Alert awake oriented to time place and person.  Moves all extremities. Psychiatric: Appears normal.  Normal affect.   Labs on Admission: I have personally reviewed following labs and imaging studies  CBC: Recent Labs  Lab 01/12/18 2333 01/13/18 0006    WBC 6.5  --   NEUTROABS 4.2  --   HGB 17.1* 18.0*  HCT 53.9* 53.0*  MCV 101.1*  --   PLT 203  --    Basic Metabolic Panel: Recent Labs  Lab 01/13/18 0006  NA 142  K 4.3  CL 95*  GLUCOSE 140*  BUN 10  CREATININE 0.80   GFR: Estimated Creatinine Clearance: 156.1 mL/min (by C-G formula based on SCr of 0.8 mg/dL). Liver Function Tests: No results for input(s): AST, ALT, ALKPHOS, BILITOT, PROT, ALBUMIN in the last 168 hours. No results for input(s): LIPASE, AMYLASE in the last 168 hours. No results for input(s): AMMONIA in the last 168 hours. Coagulation Profile: No results for input(s): INR, PROTIME in the last 168 hours. Cardiac Enzymes: No results for input(s): CKTOTAL, CKMB, CKMBINDEX, TROPONINI in the last 168 hours. BNP (last 3 results) No results for input(s): PROBNP in the last 8760 hours. HbA1C: No results for input(s): HGBA1C in the last 72 hours. CBG: No results for input(s): GLUCAP in the last 168 hours. Lipid Profile: No results for input(s): CHOL, HDL, LDLCALC, TRIG, CHOLHDL, LDLDIRECT in the last 72 hours. Thyroid Function Tests: No results for input(s): TSH, T4TOTAL, FREET4, T3FREE, THYROIDAB in the last 72 hours. Anemia Panel: No results for input(s): VITAMINB12, FOLATE, FERRITIN, TIBC, IRON, RETICCTPCT in the last 72 hours. Urine analysis: No results found for: COLORURINE, APPEARANCEUR, LABSPEC, PHURINE, GLUCOSEU, HGBUR, BILIRUBINUR, KETONESUR, PROTEINUR, UROBILINOGEN, NITRITE, LEUKOCYTESUR Sepsis Labs: @LABRCNTIP (procalcitonin:4,lacticidven:4) )No results found for this or any previous visit (from the past 240 hour(s)).   Radiological Exams on Admission: Dg Chest 2 View  Result Date: 01/12/2018 CLINICAL DATA:  Increased dyspnea for a while with swelling of the legs. EXAM: CHEST - 2 VIEW COMPARISON:  12/16/2016 CXR and CT FINDINGS: Overlying cardiomegaly. Nonaneurysmal thoracic aorta. Pulmonary vascular congestion is noted. Both lungs are clear. The  visualized skeletal structures are unremarkable. IMPRESSION: Mild pulmonary vascular congestion with borderline cardiomegaly. Electronically Signed   By: Tollie Eth M.D.   On: 01/12/2018 23:11    EKG: Independently reviewed.  Normal sinus rhythm.  Assessment/Plan Principal Problem:   Acute respiratory failure with hypoxia (HCC) Active Problems:   Tobacco abuse   Morbid obesity (HCC)    1. Acute respiratory failure with hypoxia -primarily we will check 2D echo to assess LV function.  I have ordered Dopplers of the lower extremity and VQ scan.  Unable to do CT angiogram because of the weight.  Cycle cardiac markers.  May need Lasix based on patient's blood pressure trends and further clinical picture.  For now I have placed patient on nebulizer treatment.  Patient did receive 1 dose of steroids in the ER.  Further dose  of steroids based on patient's response. 2. Dizzy spell -likely from hypotensive episode.  Patient takes Lasix as outpatient from his brother.  Could be contributing to it.  CT head is pending.  Check orthostatics in a.m. 3. Macrocytic blood picture will check anemia panel.  Likely could be from alcoholism. 4. Tobacco abuse -counseling requested. 5. Hyperglycemia check hemoglobin A1c.   DVT prophylaxis: Arixtra. Code Status: Full code. Family Communication: Discussed with patient. Disposition Plan: Home. Consults called: None. Admission status: Inpatient.   Eduard Clos MD [facility] Pager [phone].  If 7PM-7AM, please contact night-coverage [email or web address] Password [email or web address]  01/13/2018, 3:55 AM

## 2018-01-13 NOTE — Progress Notes (Signed)
Bilateral lower extremity venous duplex completed. Technically limited due to body habitus. Unable to visualize the right peroneal vein. There is no obvious evidence of DVT in the segments of the veins able to be visualized.There is no evidence of DVT or Baker's cyst. Toma DeitersVirginia Monnica Saltsman, RVS 01/13/2018 9:29 AM

## 2018-01-13 NOTE — Progress Notes (Signed)
Brief note: Arixtra for DVT prophylaxis  Patient with allergy to pork-derived products.  Arixtra 2.5 mg daily  Thanks Lorenza EvangelistGreen, Lakishia Bourassa R 01/13/2018 3:56 AM

## 2018-01-14 ENCOUNTER — Inpatient Hospital Stay (HOSPITAL_COMMUNITY): Payer: Self-pay

## 2018-01-14 DIAGNOSIS — R0602 Shortness of breath: Secondary | ICD-10-CM

## 2018-01-14 DIAGNOSIS — E118 Type 2 diabetes mellitus with unspecified complications: Secondary | ICD-10-CM

## 2018-01-14 DIAGNOSIS — J441 Chronic obstructive pulmonary disease with (acute) exacerbation: Secondary | ICD-10-CM

## 2018-01-14 DIAGNOSIS — R600 Localized edema: Secondary | ICD-10-CM

## 2018-01-14 LAB — CBC
HEMATOCRIT: 52.9 % — AB (ref 39.0–52.0)
Hemoglobin: 16.3 g/dL (ref 13.0–17.0)
MCH: 31.5 pg (ref 26.0–34.0)
MCHC: 30.8 g/dL (ref 30.0–36.0)
MCV: 102.3 fL — ABNORMAL HIGH (ref 78.0–100.0)
Platelets: 194 10*3/uL (ref 150–400)
RBC: 5.17 MIL/uL (ref 4.22–5.81)
RDW: 14.3 % (ref 11.5–15.5)
WBC: 9.2 10*3/uL (ref 4.0–10.5)

## 2018-01-14 LAB — BLOOD GAS, ARTERIAL
ACID-BASE EXCESS: 9.7 mmol/L — AB (ref 0.0–2.0)
BICARBONATE: 40.5 mmol/L — AB (ref 20.0–28.0)
DRAWN BY: 257701
O2 CONTENT: 5 L/min
O2 SAT: 92.2 %
PATIENT TEMPERATURE: 37
PH ART: 7.302 — AB (ref 7.350–7.450)
pCO2 arterial: 84.5 mmHg (ref 32.0–48.0)
pO2, Arterial: 71 mmHg — ABNORMAL LOW (ref 83.0–108.0)

## 2018-01-14 LAB — ECHOCARDIOGRAM COMPLETE
Height: 68 in
Weight: 5619.08 oz

## 2018-01-14 LAB — BASIC METABOLIC PANEL
Anion gap: 9 (ref 5–15)
BUN: 15 mg/dL (ref 6–20)
CALCIUM: 8.7 mg/dL — AB (ref 8.9–10.3)
CO2: 35 mmol/L — ABNORMAL HIGH (ref 22–32)
Chloride: 95 mmol/L — ABNORMAL LOW (ref 98–111)
Creatinine, Ser: 0.62 mg/dL (ref 0.61–1.24)
GFR calc Af Amer: >60 mL/min (ref >60)
GLUCOSE: 164 mg/dL — AB (ref 70–99)
POTASSIUM: 4.1 mmol/L (ref 3.5–5.1)
Sodium: 139 mmol/L (ref 135–145)

## 2018-01-14 LAB — GLUCOSE, CAPILLARY: GLUCOSE-CAPILLARY: 266 mg/dL — AB (ref 70–99)

## 2018-01-14 LAB — BRAIN NATRIURETIC PEPTIDE: B Natriuretic Peptide: 115.2 pg/mL — ABNORMAL HIGH (ref 0.0–100.0)

## 2018-01-14 MED ORDER — PREDNISONE 50 MG PO TABS
60.0000 mg | ORAL_TABLET | Freq: Every day | ORAL | Status: DC
  Administered 2018-01-15: 60 mg via ORAL
  Filled 2018-01-14: qty 1

## 2018-01-14 MED ORDER — PANTOPRAZOLE SODIUM 40 MG PO TBEC
40.0000 mg | DELAYED_RELEASE_TABLET | Freq: Every day | ORAL | Status: DC
Start: 2018-01-14 — End: 2018-01-15
  Administered 2018-01-14 – 2018-01-15 (×2): 40 mg via ORAL
  Filled 2018-01-14 (×2): qty 1

## 2018-01-14 MED ORDER — INSULIN ASPART 100 UNIT/ML ~~LOC~~ SOLN
0.0000 [IU] | Freq: Three times a day (TID) | SUBCUTANEOUS | Status: DC
  Administered 2018-01-15 (×2): 7 [IU] via SUBCUTANEOUS

## 2018-01-14 MED ORDER — METHYLPREDNISOLONE SODIUM SUCC 125 MG IJ SOLR
60.0000 mg | Freq: Three times a day (TID) | INTRAMUSCULAR | Status: DC
  Administered 2018-01-14: 60 mg via INTRAVENOUS
  Filled 2018-01-14: qty 2

## 2018-01-14 MED ORDER — IPRATROPIUM-ALBUTEROL 0.5-2.5 (3) MG/3ML IN SOLN
3.0000 mL | Freq: Three times a day (TID) | RESPIRATORY_TRACT | Status: DC
  Administered 2018-01-14 – 2018-01-15 (×4): 3 mL via RESPIRATORY_TRACT
  Filled 2018-01-14 (×5): qty 3

## 2018-01-14 MED ORDER — BUDESONIDE 0.5 MG/2ML IN SUSP
0.5000 mg | Freq: Two times a day (BID) | RESPIRATORY_TRACT | Status: DC
Start: 2018-01-14 — End: 2018-01-15
  Administered 2018-01-14 – 2018-01-15 (×3): 0.5 mg via RESPIRATORY_TRACT
  Filled 2018-01-14 (×4): qty 2

## 2018-01-14 MED ORDER — ARFORMOTEROL TARTRATE 15 MCG/2ML IN NEBU
15.0000 ug | INHALATION_SOLUTION | Freq: Two times a day (BID) | RESPIRATORY_TRACT | Status: DC
  Administered 2018-01-14 – 2018-01-15 (×3): 15 ug via RESPIRATORY_TRACT
  Filled 2018-01-14 (×4): qty 2

## 2018-01-14 MED ORDER — FLUTICASONE PROPIONATE 50 MCG/ACT NA SUSP
2.0000 | Freq: Every day | NASAL | Status: DC
  Administered 2018-01-14 – 2018-01-15 (×2): 2 via NASAL
  Filled 2018-01-14: qty 16

## 2018-01-14 MED ORDER — PERFLUTREN LIPID MICROSPHERE
1.0000 mL | INTRAVENOUS | Status: AC | PRN
  Filled 2018-01-14: qty 10

## 2018-01-14 MED ORDER — PERFLUTREN LIPID MICROSPHERE
INTRAVENOUS | Status: AC
  Filled 2018-01-14: qty 10

## 2018-01-14 MED ORDER — METHYLPREDNISOLONE SODIUM SUCC 125 MG IJ SOLR
60.0000 mg | Freq: Two times a day (BID) | INTRAMUSCULAR | Status: AC
  Administered 2018-01-14: 60 mg via INTRAVENOUS
  Filled 2018-01-14: qty 2

## 2018-01-14 MED ORDER — LORATADINE 10 MG PO TABS
10.0000 mg | ORAL_TABLET | Freq: Every day | ORAL | Status: DC
  Administered 2018-01-14 – 2018-01-15 (×2): 10 mg via ORAL
  Filled 2018-01-14 (×2): qty 1

## 2018-01-14 MED ORDER — AMOXICILLIN-POT CLAVULANATE 875-125 MG PO TABS
1.0000 | ORAL_TABLET | Freq: Two times a day (BID) | ORAL | Status: DC
  Administered 2018-01-14 – 2018-01-15 (×3): 1 via ORAL
  Filled 2018-01-14 (×3): qty 1

## 2018-01-14 NOTE — Progress Notes (Signed)
PROGRESS NOTE    Bryan Mitchell  ZOX:[id number] DOB: August 24, 1960 DOA: 01/12/2018 PCP: Patient, No Pcp Per   Brief Narrative:  Bryan Mitchell a 57 y.o.malewithhistory of hypertension who has not been to a doctor for many years and takes Lasix from his brother over the last 1 year for congestion and shortness of breath and lower extremity presents to the ER because of worsening lower extremity edema over the last few days despite taking his brothers Lasix, hypoxic on arrival requiring about 5 lit of [place] oxygen, looks flushed. His CXR shows pulmonary congestion, D dimer slightly elevated,. He was admitted for acute respiratory failure with hypoxia and hypercapnia probably secondary to a combination of acute CHF exacerbation, COPD exacerbation, OSA, obesity hyperventilation syndrome and possible evaluation of PE/DVT.      Assessment & Plan:   Principal Problem:   Acute respiratory failure with hypoxia (HCC) Active Problems:   Tobacco abuse   Morbid obesity (HCC)  #1 acute respiratory failure with hypoxia and hypercapnia likely secondary to an acute COPD exacerbation plus or minus CHF exacerbation.   Patient had presented with lightheadedness and dizziness with a near syncopal episode and noted to have borderline blood pressure on admission.  Patient given some IV Lasix urine output not properly recorded.  Patient noted to be wheezing on admission and a history of tobacco use since age 7.  CT angiogram chest negative for PE.  2D echo done results pending.  ABG obtained this morning with a pH of 7.3, PCO2 of 84, PO2 of 71, bicarb of 40.5.  Will place on IV Solu-Medrol, Augmentin, Claritin, Protonix, Flonase, Pulmicort scheduled duo nebs.  Will likely need home O2 on discharge.  Will need outpatient follow-up with pulmonary for further evaluation for PFTs and probably sleep study to rule out obstructive sleep apnea.  2.  Tobacco abuse Tobacco cessation.  Placed on a nicotine patch.  3.   Morbid obesity  4.  Nonpitting lower extremity edema Patient with nonpitting lower extremity edema likely secondary to venous stasis versus secondary to cor pulmonale from pulmonary disease.  2D echo pending.  LFTs within normal limits.  Renal function normal.  Patient was not on any calcium channel blockers prior to admission.  Lower extremity Dopplers negative for DVT.  TED hose.  Follow.  5.  Dizzy spells/lightheadedness Likely secondary to a hypotensive episode as patient was noted to be taken his brothers Lasix prior to admission.  CT head negative.  Patient not orthostatic this morning and per PT was asymptomatic when orthostatics were checked.  Discontinue IV Lasix for now.  2D echo pending.  Follow.  6.  Hyperglycemia/newly diagnosed diabetes mellitus Hemoglobin A1c 7.3.  Patient on IV steroids.  Will place on sliding scale insulin.  Check CBGs before meals and at bedtime.  Will need outpatient follow-up with PCP.  May need to be started on oral metformin however will defer to PCP.   DVT prophylaxis: arixtra Code Status: Full Family Communication: Updated patient.  Updated sister at bedside. Disposition Plan: Likely home once clinical improvement hopefully in the next 24 to 48 hours.   Consultants:   None  Procedures:   CT head 01/13/2018  CT angiogram chest 01/13/2018  Chest x-ray 01/12/2018  2D echo 01/14/2018--- results pending  Lower extremity Doppler 01/13/2018  Antimicrobials:   Augmentin 01/14/2018   Subjective: Patient states feeling better since admission.  Denies any chest pain.  Denies any shortness of breath.  Complaining of lower extremity swelling.  No further lightheadedness  or dizziness.  Objective: Vitals:   01/14/18 0458 01/14/18 0502 01/14/18 0900 01/14/18 1123  BP:  101/68    Pulse:  82    Resp:  20    Temp:  97.8 F (36.6 C)    TempSrc:      SpO2:  92% 92% 91%  Weight: (!) 159.3 kg (351 lb 3.1 oz)     Height:        Intake/Output Summary  (Last 24 hours) at 01/14/2018 1156 Last data filed at 01/14/2018 1100 Gross per 24 hour  Intake 597.17 ml  Output 325 ml  Net 272.17 ml   Filed Weights   01/13/18 0115 01/13/18 0317 01/14/18 0458  Weight: (!) 160.2 kg (353 lb 3.2 oz) (!) 161.9 kg (356 lb 14.8 oz) (!) 159.3 kg (351 lb 3.1 oz)    Examination:  General exam: Appears calm and comfortable  Respiratory system: Minimal expiratory wheezing.  No crackles noted.  No use of accessory muscles of respiration.  Respiratory effort normal. Cardiovascular system: S1 & S2 heard, RRR. No JVD, murmurs, rubs, gallops or clicks. No pedal edema. Gastrointestinal system: Abdomen is nondistended, soft and nontender. No organomegaly or masses felt. Normal bowel sounds heard. Central nervous system: Alert and oriented. No focal neurological deficits. Extremities: Nonpitting edema.  Symmetric 5 x 5 power. Skin: No rashes, lesions or ulcers Psychiatry: Judgement and insight appear normal. Mood & affect appropriate.     Data Reviewed: I have personally reviewed following labs and imaging studies  CBC: Recent Labs  Lab 01/12/18 2333 01/13/18 0006 01/13/18 0423 01/14/18 0439  WBC 6.5  --  7.4 9.2  NEUTROABS 4.2  --  6.0  --   HGB 17.1* 18.0* 17.6* 16.3  HCT 53.9* 53.0* 56.8* 52.9*  MCV 101.1*  --  102.7* 102.3*  PLT 203  --  184 194   Basic Metabolic Panel: Recent Labs  Lab 01/13/18 0006 01/13/18 0423 01/14/18 0439  NA 142 142 139  K 4.3 4.7 4.1  CL 95* 97* 95*  CO2  --  35* 35*  GLUCOSE 140* 201* 164*  BUN 10 10 15   CREATININE 0.80 0.83 0.62  CALCIUM  --  8.6* 8.7*  MG  --  2.3  --    GFR: Estimated Creatinine Clearance: 154.7 mL/min (by C-G formula based on SCr of 0.62 mg/dL). Liver Function Tests: Recent Labs  Lab 01/13/18 0423  AST 25  ALT 25  ALKPHOS 93  BILITOT 0.4  PROT 7.4  ALBUMIN 3.6   No results for input(s): LIPASE, AMYLASE in the last 168 hours. No results for input(s): AMMONIA in the last 168  hours. Coagulation Profile: No results for input(s): INR, PROTIME in the last 168 hours. Cardiac Enzymes: Recent Labs  Lab 01/13/18 0423 01/13/18 0945  TROPONINI <0.03 <0.03   BNP (last 3 results) No results for input(s): PROBNP in the last 8760 hours. HbA1C: Recent Labs    01/13/18 0953  HGBA1C 7.3*   CBG: No results for input(s): GLUCAP in the last 168 hours. Lipid Profile: No results for input(s): CHOL, HDL, LDLCALC, TRIG, CHOLHDL, LDLDIRECT in the last 72 hours. Thyroid Function Tests: Recent Labs    01/13/18 0423  TSH 1.792   Anemia Panel: Recent Labs    01/13/18 0953  VITAMINB12 383  FOLATE 15.3  FERRITIN 34  TIBC 434  IRON 47  RETICCTPCT 1.7   Sepsis Labs: No results for input(s): PROCALCITON, LATICACIDVEN in the last 168 hours.  No results found for  this or any previous visit (from the past 240 hour(s)).       Radiology Studies: Dg Chest 2 View  Result Date: 01/12/2018 CLINICAL DATA:  Increased dyspnea for a while with swelling of the legs. EXAM: CHEST - 2 VIEW COMPARISON:  12/16/2016 CXR and CT FINDINGS: Overlying cardiomegaly. Nonaneurysmal thoracic aorta. Pulmonary vascular congestion is noted. Both lungs are clear. The visualized skeletal structures are unremarkable. IMPRESSION: Mild pulmonary vascular congestion with borderline cardiomegaly. Electronically Signed   By: Tollie Ethavid  Kwon M.D.   On: 01/12/2018 23:11   Ct Head Wo Contrast  Result Date: 01/13/2018 CLINICAL DATA:  57 year old male with suspected acute CHF and COPD exacerbation. Near syncope, altered level consciousness. EXAM: CT HEAD WITHOUT CONTRAST TECHNIQUE: Contiguous axial images were obtained from the base of the skull through the vertex without intravenous contrast. COMPARISON:  None. FINDINGS: Brain: No midline shift, ventriculomegaly, mass effect, evidence of mass lesion, intracranial hemorrhage or evidence of cortically based acute infarction. Gray-white matter differentiation is  within normal limits throughout the brain. Cerebral volume is within normal limits for age. Vascular: Mild Calcified atherosclerosis at the skull base. No suspicious intracranial vascular hyperdensity. Skull: Negative. Sinuses/Orbits: Mild mucosal thickening in the visible bilateral maxillary and right sphenoid sinuses. 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 Bilateral tympanic cavities and mastoids are clear. Other: Visualized orbits and scalp soft tissues are within normal limits. IMPRESSION: 1.  Normal for age non contrast CT appearance of the brain. 2. Mild maxillary and right sphenoid sinus inflammation. Electronically Signed   By: Odessa FlemingH  Hall M.D.   On: 01/13/2018 15:57   Ct Angio Chest Pe W Or Wo Contrast  Result Date: 01/13/2018 CLINICAL DATA:  Shortness of breath. EXAM: CT ANGIOGRAPHY CHEST WITH CONTRAST TECHNIQUE: Multidetector CT imaging of the chest was performed using the standard protocol during bolus administration of intravenous contrast. Multiplanar CT image reconstructions and MIPs were obtained to evaluate the vascular anatomy. CONTRAST:  100mL ISOVUE-370 IOPAMIDOL (ISOVUE-370) INJECTION 76% COMPARISON:  Radiographs of January 12, 2018.  CT scan of December 16, 2016. FINDINGS: Cardiovascular: No large central pulmonary embolus is noted. However, due to respiratory motion artifact, smaller peripheral pulmonary emboli in the lower lobe branches cannot be excluded on the basis of this exam. There is no evidence of thoracic aortic dissection or aneurysm. Normal cardiac size. No pericardial effusion is noted. Mediastinum/Nodes: No enlarged mediastinal, hilar, or axillary lymph nodes. Thyroid gland, trachea, and esophagus demonstrate no significant findings. Lungs/Pleura: No pneumothorax or pleural effusion is noted. Mild bibasilar subsegmental atelectasis is noted. Upper Abdomen: No acute abnormality. Musculoskeletal: No chest wall abnormality. No acute or significant osseous  findings. Review of the MIP images confirms the above findings. IMPRESSION: No large central pulmonary embolus is noted. However, due to respiratory motion artifact, smaller peripheral pulmonary emboli in the lower lobe branches of the pulmonary arteries cannot be excluded on the basis of this exam. Mild bibasilar subsegmental atelectasis is noted. Electronically Signed   By: Lupita RaiderJames  Green Jr, M.D.   On: 01/13/2018 16:02        Scheduled Meds: . arformoterol  15 mcg Nebulization BID  . budesonide (PULMICORT) nebulizer solution  0.5 mg Nebulization BID  . fluticasone  2 spray Each Nare Daily  . folic acid  1 mg Oral Daily  . fondaparinux (ARIXTRA) injection  2.5 mg Subcutaneous Daily  . ipratropium-albuterol  3 mL Nebulization TID  . loratadine  10 mg Oral Daily  . methylPREDNISolone (SOLU-MEDROL) injection  60 mg Intravenous Q8H  . multivitamin with minerals  1 tablet Oral Daily  . nicotine  21 mg Transdermal Daily  . pantoprazole  40 mg Oral Q0600  . thiamine  100 mg Oral Daily   Or  . thiamine  100 mg Intravenous Daily   Continuous Infusions: . sodium chloride 10 mL/hr at 01/13/18 1243     LOS: 1 day    Time spent: 40 minutes    Ramiro Harvest, MD [facility] Pager [phone]  If 7PM-7AM, please contact night-coverage [email or web address] Password [email or web address] 01/14/2018, 11:56 AM

## 2018-01-14 NOTE — Progress Notes (Signed)
  Echocardiogram 2D Echocardiogram has been performed.  Bryan Mitchell, Bryan Mitchell F 01/14/2018, 10:05 AM

## 2018-01-14 NOTE — Evaluation (Signed)
Physical Therapy Evaluation Patient Details Name: Bryan Mitchell MRN: [id number] DOB: 1961/01/23 Today's Date: 01/14/2018   History of Present Illness  Pt is a 57 y.o. male with history of HTN who has not been to a doctor for many years and takes Lasix from his brother over the last 1 year for congestion, SOB and LE edema. He presented to the ER because of worsening lower extremity edema.     Clinical Impression  Pt admitted with above diagnosis. Pt currently with functional limitations due to the deficits listed below (see PT Problem List). On eval, pt required supervision transfers and ambulation 100 feet without AD. Orthostatic BP taken as follows: 125/66 supine, 123/79 sitting, 109/65 initial stance, and 113/64 after standing 3 minutes. O2 removed for assessment on RA with quick desat to 84%. 5 L O2 replaced for ambulation with SpO2 94%. Ambulation/standing limited by bilat foot pain. Pt reports he lives a sedentary lifestyle. Pt will benefit from skilled PT to increase their independence and safety with mobility to allow discharge to the venue listed below.  PT to follow acutely. No follow up services indicated.     Follow Up Recommendations No PT follow up;Supervision - Intermittent    Equipment Recommendations  None recommended by PT    Recommendations for Other Services       Precautions / Restrictions Precautions Precautions: Other (comment) Precaution Comments: watch sats      Mobility  Bed Mobility Overal bed mobility: Modified Independent                Transfers Overall transfer level: Needs assistance Equipment used: None Transfers: Stand Pivot Transfers;Sit to/from Stand Sit to Stand: Supervision Stand pivot transfers: Supervision       General transfer comment: supervision for safety  Ambulation/Gait Ambulation/Gait assistance: Supervision Gait Distance (Feet): 100 Feet Assistive device: None Gait Pattern/deviations: Step-through pattern;Decreased  stride length Gait velocity: mildly decreased Gait velocity interpretation: 1.31 - 2.62 ft/sec, indicative of limited community ambulator General Gait Details: steady gait. Ambulated on 5 L O2 with SpO2 94%.  Stairs            Wheelchair Mobility    Modified Rankin (Stroke Patients Only)       Balance Overall balance assessment: No apparent balance deficits (not formally assessed)                                           Pertinent Vitals/Pain Pain Assessment: Faces Faces Pain Scale: Hurts even more Pain Location: bilat feet during stance Pain Descriptors / Indicators: Sore Pain Intervention(s): Monitored during session;Limited activity within patient's tolerance    Home Living Family/patient expects to be discharged to:: Private residence Living Arrangements: Alone Available Help at Discharge: Family;Available PRN/intermittently Type of Home: Mobile home Home Access: Ramped entrance     Home Layout: One level Home Equipment: None      Prior Function Level of Independence: Independent               Hand Dominance   Dominant Hand: Right    Extremity/Trunk Assessment   Upper Extremity Assessment Upper Extremity Assessment: Overall WFL for tasks assessed    Lower Extremity Assessment Lower Extremity Assessment: Overall WFL for tasks assessed    Cervical / Trunk Assessment Cervical / Trunk Assessment: Normal  Communication   Communication: No difficulties  Cognition Arousal/Alertness: Awake/alert Behavior During Therapy: WFL for tasks  assessed/performed Overall Cognitive Status: Within Functional Limits for tasks assessed                                 General Comments: Frustrated as his situation. Concerned about hospital bills      General Comments      Exercises     Assessment/Plan    PT Assessment Patient needs continued PT services  PT Problem List Decreased mobility;Obesity;Cardiopulmonary status  limiting activity       PT Treatment Interventions Therapeutic activities;Gait training;Patient/family education;Therapeutic exercise;Functional mobility training    PT Goals (Current goals can be found in the Care Plan section)  Acute Rehab PT Goals Patient Stated Goal: home PT Goal Formulation: With patient Time For Goal Achievement: 01/28/18 Potential to Achieve Goals: Good    Frequency Min 3X/week   Barriers to discharge        Co-evaluation               AM-PAC PT "6 Clicks" Daily Activity  Outcome Measure Difficulty turning over in bed (including adjusting bedclothes, sheets and blankets)?: A Little Difficulty moving from lying on back to sitting on the side of the bed? : A Little Difficulty sitting down on and standing up from a chair with arms (e.g., wheelchair, bedside commode, etc,.)?: None Help needed moving to and from a bed to chair (including a wheelchair)?: None Help needed walking in hospital room?: None Help needed climbing 3-5 steps with a railing? : A Little 6 Click Score: 21    End of Session Equipment Utilized During Treatment: Oxygen Activity Tolerance: Patient tolerated treatment well Patient left: in bed;with call bell/phone within reach Nurse Communication: Mobility status PT Visit Diagnosis: Difficulty in walking, not elsewhere classified (R26.2)    Time: [id number] PT Time Calculation (min) (ACUTE ONLY): 27 min   Charges:   PT Evaluation $PT Eval Low Complexity: 1 Low PT Treatments $Therapeutic Activity: 8-22 mins   PT G Codes:        Aida RaiderWendy Emilyrose Darrah, PT  Office # [phone] Pager [phone]   Ilda FoilGarrow, Sabel Hornbeck Rene 01/14/2018, 10:34 AM

## 2018-01-14 NOTE — Progress Notes (Signed)
SATURATION QUALIFICATIONS: (This note is used to comply with regulatory documentation for home oxygen)  Patient Saturations on Room Air at Rest = 84%  Patient Saturations on Room Air while Ambulating = N/A  Patient Saturations on 5 Liters of oxygen while Ambulating = 94%  Please briefly explain why patient needs home oxygen:  Pt unable to maintain SpO2 >90% at rest on RA. 5 L O2 required during ambulation 100 feet to maintain SpO2 at 94%.

## 2018-01-14 NOTE — Care Management Note (Signed)
Case Management Note  Patient Details  Name: Bryan CrockerJeffrey Mitchell MRN: [id number] Date of Birth: 25-Dec-1960  Subjective/Objective:       Admitted with acute respiratory failure with hypoxia             Action/Plan: Plan to discharge home. [facility]    Expected Discharge Date:                  Expected Discharge Plan:  Home w Home Health Services  In-House Referral:     Discharge planning Services  CM Consult  Post Acute Care Choice:    Choice offered to:  Patient  DME Arranged:  Oxygen DME Agency:  [facility]  HH Arranged:  RN, Social Work [facility] [facility]  [facility]  Status of Service:  In process, will continue to follow  If discussed at Long Length of Stay Meetings, dates discussed:    Additional CommentsGeni Bers:  Jebidiah Baggerly, RN 01/14/2018, 2:18 PM

## 2018-01-14 NOTE — Progress Notes (Signed)
ABG results called to nurse by RT. MD paged in AMION to review results.

## 2018-01-14 NOTE — Progress Notes (Signed)
Inpatient Diabetes Program Recommendations  AACE/ADA: New Consensus Statement on Inpatient Glycemic Control (2015)  Target Ranges:  Prepandial:   less than 140 mg/dL      Peak postprandial:   less than 180 mg/dL (1-2 hours)      Critically ill patients:  140 - 180 mg/dL   Review of Glycemic Control  Diabetes history: None Current orders for Inpatient glycemic control: None  Inpatient Diabetes Program Recommendations:    IV Solumedrol 60 mg Q12 hours. Possibly transitioning to PO prednisone. Please consider CBGs and Novolog Moderate 0-15 units tid + Novolog HS scale while inpatient.  Noted hyperglycemia on admission. A1c obtained resulted at 7.3% meeting criteria for new DM diagnosis per the ADA. When patient is informed please place Dm Consult for lifestyle modifications, Dietitian consult, Consider Metformin 500 mg BID at time of d/c. Will need close follow up with PCP.  Thanks,  Christena DeemShannon Darryl Willner RN, MSN, BC-ADM, [facility] Inpatient Diabetes Coordinator Team Pager [phone] (8a-5p)

## 2018-01-15 DIAGNOSIS — Z720 Tobacco use: Secondary | ICD-10-CM

## 2018-01-15 DIAGNOSIS — J441 Chronic obstructive pulmonary disease with (acute) exacerbation: Principal | ICD-10-CM

## 2018-01-15 DIAGNOSIS — J9601 Acute respiratory failure with hypoxia: Secondary | ICD-10-CM

## 2018-01-15 DIAGNOSIS — E118 Type 2 diabetes mellitus with unspecified complications: Secondary | ICD-10-CM

## 2018-01-15 DIAGNOSIS — R600 Localized edema: Secondary | ICD-10-CM

## 2018-01-15 LAB — GLUCOSE, CAPILLARY
Glucose-Capillary: 219 mg/dL — ABNORMAL HIGH (ref 70–99)
Glucose-Capillary: 207 mg/dL — ABNORMAL HIGH (ref 70–99)

## 2018-01-15 LAB — BASIC METABOLIC PANEL
Anion gap: 9 (ref 5–15)
BUN: 14 mg/dL (ref 6–20)
CO2: 34 mmol/L — ABNORMAL HIGH (ref 22–32)
Calcium: 8.9 mg/dL (ref 8.9–10.3)
Chloride: 94 mmol/L — ABNORMAL LOW (ref 98–111)
Creatinine, Ser: 0.63 mg/dL (ref 0.61–1.24)
GFR calc Af Amer: >60 mL/min (ref >60)
Glucose, Bld: 230 mg/dL — ABNORMAL HIGH (ref 70–99)
POTASSIUM: 4.6 mmol/L (ref 3.5–5.1)
Sodium: 137 mmol/L (ref 135–145)

## 2018-01-15 LAB — CBC
HCT: 52.3 % — ABNORMAL HIGH (ref 39.0–52.0)
Hemoglobin: 16.3 g/dL (ref 13.0–17.0)
MCH: 31.4 pg (ref 26.0–34.0)
MCHC: 31.2 g/dL (ref 30.0–36.0)
MCV: 100.8 fL — ABNORMAL HIGH (ref 78.0–100.0)
PLATELETS: 190 10*3/uL (ref 150–400)
RBC: 5.19 MIL/uL (ref 4.22–5.81)
RDW: 14.3 % (ref 11.5–15.5)
WBC: 8.6 10*3/uL (ref 4.0–10.5)

## 2018-01-15 MED ORDER — IPRATROPIUM-ALBUTEROL 0.5-2.5 (3) MG/3ML IN SOLN: 3.0000 mL | RESPIRATORY_TRACT | 0 refills | Status: DC | PRN

## 2018-01-15 MED ORDER — AMOXICILLIN-POT CLAVULANATE 875-125 MG PO TABS: 1.0000 | ORAL_TABLET | Freq: Two times a day (BID) | ORAL | 0 refills | Status: AC

## 2018-01-15 MED ORDER — ADULT MULTIVITAMIN W/MINERALS CH: 1.0000 | ORAL_TABLET | Freq: Every day | ORAL | Status: DC

## 2018-01-15 MED ORDER — METFORMIN HCL 500 MG PO TABS: 500.0000 mg | ORAL_TABLET | Freq: Two times a day (BID) | ORAL | 0 refills | Status: DC

## 2018-01-15 MED ORDER — MOMETASONE FURO-FORMOTEROL FUM 200-5 MCG/ACT IN AERO: 2.0000 | INHALATION_SPRAY | Freq: Two times a day (BID) | RESPIRATORY_TRACT | 0 refills | Status: DC

## 2018-01-15 MED ORDER — PANTOPRAZOLE SODIUM 40 MG PO TBEC: 40.0000 mg | DELAYED_RELEASE_TABLET | Freq: Every day | ORAL | 0 refills | Status: DC

## 2018-01-15 MED ORDER — NICOTINE 21 MG/24HR TD PT24: 21.0000 mg | MEDICATED_PATCH | Freq: Every day | TRANSDERMAL | 0 refills | Status: DC

## 2018-01-15 MED ORDER — FLUTICASONE PROPIONATE 50 MCG/ACT NA SUSP: 2.0000 | Freq: Every day | NASAL | 0 refills | Status: DC

## 2018-01-15 MED ORDER — TIOTROPIUM BROMIDE MONOHYDRATE 18 MCG IN CAPS: 18.0000 ug | ORAL_CAPSULE | Freq: Every day | RESPIRATORY_TRACT | 0 refills | Status: DC

## 2018-01-15 MED ORDER — GI COCKTAIL ~~LOC~~: 30.0000 mL | Freq: Three times a day (TID) | ORAL | Status: DC | PRN

## 2018-01-15 MED ORDER — TIOTROPIUM BROMIDE MONOHYDRATE 18 MCG IN CAPS
18.0000 ug | ORAL_CAPSULE | Freq: Every day | RESPIRATORY_TRACT | Status: DC
  Filled 2018-01-15: qty 5

## 2018-01-15 MED ORDER — LIVING WELL WITH DIABETES BOOK
Freq: Once | Status: AC
  Administered 2018-01-15: 1
  Filled 2018-01-15: qty 1

## 2018-01-15 MED ORDER — FOLIC ACID 1 MG PO TABS: 1.0000 mg | ORAL_TABLET | Freq: Every day | ORAL | Status: DC

## 2018-01-15 MED ORDER — BLOOD GLUCOSE METER KIT: PACK | 0 refills | Status: DC

## 2018-01-15 MED ORDER — THIAMINE HCL 100 MG PO TABS: 100.0000 mg | ORAL_TABLET | Freq: Every day | ORAL | Status: DC

## 2018-01-15 MED ORDER — MOMETASONE FURO-FORMOTEROL FUM 200-5 MCG/ACT IN AERO
2.0000 | INHALATION_SPRAY | Freq: Two times a day (BID) | RESPIRATORY_TRACT | Status: DC
  Filled 2018-01-15: qty 8.8

## 2018-01-15 MED ORDER — LORATADINE 10 MG PO TABS: 10.0000 mg | ORAL_TABLET | Freq: Every day | ORAL | 0 refills | Status: DC

## 2018-01-15 MED ORDER — PREDNISONE 20 MG PO TABS: 20.0000 mg | ORAL_TABLET | Freq: Every day | ORAL | 0 refills | Status: DC

## 2018-01-15 MED ORDER — ALBUTEROL SULFATE (2.5 MG/3ML) 0.083% IN NEBU
2.5000 mg | INHALATION_SOLUTION | Freq: Three times a day (TID) | RESPIRATORY_TRACT | Status: DC
  Administered 2018-01-15: 2.5 mg via RESPIRATORY_TRACT
  Filled 2018-01-15: qty 3

## 2018-01-15 NOTE — Progress Notes (Signed)
Inpatient Diabetes Program Recommendations  AACE/ADA: New Consensus Statement on Inpatient Glycemic Control (2015)  Target Ranges:  Prepandial:   less than 140 mg/dL      Peak postprandial:   less than 180 mg/dL (1-2 hours)      Critically ill patients:  140 - 180 mg/dL   Lab Results  Component Value Date   GLUCAP 207 (H) 01/15/2018   HGBA1C 7.3 (H) 01/13/2018    Review of Glycemic Control  Spoke with pt at length about his diagnosis of diabetes. Pt states he has no family hx DM. Has gained approximately 30 pounds in the past month. Pt states he has no PCP. Drinks approximately 10 beers/day and drinks liquor. Smoker. States he has been depressed for past year. No exercise. Discussed importance of lifestyle modifications to control his blood sugars, such as healthy diet, exercise, and stress.  management. Discussed HgbA1C of 7.3% and goals. Instructed to monitor blood sugars 1x/day at different times and take logbook to MD appt for review.   [facility]   Inpatient Diabetes Program Recommendations:     Metformin 500 mg bid Blood glucose meter and supplies  Pt voiced understanding and seems motivated to control his blood sugars.   Thank you. Ailene Ardshonda Yannet Rincon, RD, LDN, CDE Inpatient Diabetes Coordinator [phone]

## 2018-01-15 NOTE — Care Management Note (Signed)
Case Management Note  Patient Details  Name: Bryan CrockerJeffrey Mitchell MRN: [id number] Date of Birth: [birth year]  Subjective/Objective:        Pt admitted with SOB.            Action/Plan:  Plan to discharge home with [facility]   Expected Discharge Date:                  Expected Discharge Plan:  Home w Home Health Services  In-House Referral:     Discharge planning Services  CM Consult, Follow-up appt scheduled  Post Acute Care Choice:    Choice offered to:  Patient  DME Arranged:  Oxygen, Nebulizer/meds DME Agency:  [facility]  HH Arranged:    HH Agency:     Status of Service:  In process, will continue to follow  If discussed at Long Length of Stay Meetings, dates discussed:    Additional CommentsGeni Bers:  Tanyia Grabbe, RN 01/15/2018, 1:21 PM

## 2018-01-15 NOTE — Progress Notes (Signed)
PHYSICAL THERAPY  SATURATION QUALIFICATIONS: (This note is used to comply with regulatory documentation for home oxygen)  Patient Saturations on Room Air at Rest = 90%  Patient Saturations on Room Air while Ambulating >350 feet = 84%  Patient Saturations on 2 Liters of oxygen while Ambulating = 88 - 91%  Please briefly explain why patient needs home oxygen:  Pt does require supplemental oxygen to achieve therapeutic level  Felecia ShellingLori Ella Golomb  PTA [facility]  Acute  Rehab Pager      [phone]

## 2018-01-15 NOTE — Progress Notes (Signed)
Discharge instructions (including medications) discussed with and copy provided to patient/caregiver 

## 2018-01-15 NOTE — Discharge Summary (Signed)
Physician Discharge Summary  Bryan Mitchell LGX:[id number] DOB: 1961/01/06 DOA: 01/12/2018  PCP: Patient, No Pcp Per  Admit date: 01/12/2018 Discharge date: 01/15/2018  Time spent: 65 minutes  Recommendations for Outpatient Follow-up:  1. Patient will follow up with [facility] on 02/03/2018 at 1 PM.  On follow-up patient will need a basic metabolic profile done to follow-up on electrolytes and renal function.  COPD will need to be reassessed at that time and patient may benefit from outpatient referral to pulmonary for sleep study and PFTs and further management of probable COPD.  Patient also diagnosed with new onset diabetes which will need to be followed up upon.   Discharge Diagnoses:  Principal Problem:   Acute respiratory failure with hypoxia (HCC) Active Problems:   Tobacco abuse   Morbid obesity (HCC)   COPD exacerbation (HCC)   Type 2 diabetes mellitus with complication, without long-term current use of insulin (HCC)   Edema of lower extremity   Discharge Condition: Stable and improved.  Diet recommendation: Carb modified.  Filed Weights   01/13/18 0317 01/14/18 0458 01/15/18 0500  Weight: (!) 161.9 kg (356 lb 14.8 oz) (!) 159.3 kg (351 lb 3.1 oz) (!) 158.2 kg (348 lb 12.8 oz)    History of present illness:  Per Dr Darrick Meigs is a 57 y.o. male with history of hypertension who has not been to a doctor for many years and takes Lasix from his brother over the last 1 year for congestion and shortness of breath and lower extremity presents to the ER because of worsening lower extremity edema over the last few days despite taking his brothers Lasix.  Patient also has feeling of congestion in his chest.  Denied any pain in particular.  Denied any cough or fever chills.  Patient yesterday also had a dizzy spell while when he stood up he felt like he was seeing objects or abstract.  Did not lose consciousness.  Not had any weakness of upper or lower  extremities.  ED Course: In the ER on arrival patient was mildly hypotensive but improved after taking rest and lying down.  Patient was found to be wheezing and was placed on nebulizer treatment and steroids.  Chest x-ray was unremarkable d-dimer was mildly elevated.  On exam patient has bilateral lower extremity edema.  Patient admitted for further work-up of acute respiratory failure likely possibility of fluid overload and will not rule out any DVT or PE.  COPD also is a possibility.    Hospital Course:  1 acute respiratory failure with hypoxia and hypercapnia likely secondary to an acute COPD exacerbation plus or minus CHF exacerbation.   Patient had presented with lightheadedness and dizziness with a near syncopal episode and noted to have borderline blood pressure on admission.  Patient given some IV Lasix on admission, urine output not properly recorded.  Patient noted to be wheezing on admission and a history of tobacco use since age 5.  CT angiogram chest negative for PE.  2D echo done results with a EF of 60 to 65%, no wall motion abnormalities, grade 2 diastolic dysfunction. ABG obtained this morning with a pH of 7.3, PCO2 of 84, PO2 of 71, bicarb of 40.5.    IV Lasix was subsequently discontinued that patient had presented with dizzy spells and lightheadedness.  Patient also diuresed with a urine output of 2.2 to 5 L over 24-hour.  After diuretics were discontinued.  Patient improved clinically.  Patient was placed on  IV Solu-Medrol taper,  Augmentin, Claritin, Protonix, Flonase, Pulmicort scheduled duo nebs.    Patient improved clinically.  IV Solu-Medrol was transitioned to oral prednisone taper which patient will be discharged home on.  Patient will also be discharged home on home O2.  Patient improved clinically and will be discharged in stable and improved condition.  Will need outpatient follow-up with pulmonary for further evaluation for PFTs and probably sleep study to rule out  obstructive sleep apnea.  2.  Tobacco abuse Tobacco cessation.  Placed on a nicotine patch.  3.  Morbid obesity  4.  Nonpitting lower extremity edema Patient with nonpitting lower extremity edema likely secondary to venous stasis versus secondary to cor pulmonale from pulmonary disease.  2D echo was done with a EF of 60 to 65% with grade 2 diastolic dysfunction, no wall motion abnormalities.  Due to patient's presentation with dizzy spells IV Lasix which was started was subsequently discontinued.  LFTs within normal limits.  Renal function normal.  Patient was not on any calcium channel blockers prior to admission.  Lower extremity Dopplers negative for DVT.  Patient had some improvement with nonpitting lower extremity edema.  Patient also diuresed  with a urine output of 2.25 L over 24-hour.  After diuretics were discontinued.  Patient was -1.249 L during this hospitalization.  Outpatient follow-up.  5.  Dizzy spells/lightheadedness Likely secondary to a hypotensive episode as patient was noted to be taken his brothers Lasix prior to admission.  CT head negative.  Patient not orthostatic the morning of 01/14/2018, and per PT was asymptomatic when orthostatics were checked.  Patient was initially placed on IV Lasix that was subsequently discontinued.  2D echo was obtained with a EF of 60 to 65% with grade 2 diastolic dysfunction, no wall motion abnormalities.  Patient's blood pressure improved.  Patient had no further dizzy spells.  Outpatient follow-up.  6.  Hyperglycemia/newly diagnosed diabetes mellitus Hemoglobin A1c 7.3.  Patient on IV steroids during the hospitalization for probable acute COPD exacerbation.  Patient placed on sliding scale insulin.  Patient will be discharged home on metformin 500 mg twice daily with outpatient follow-up.     Procedures:  CT head 01/13/2018  CT angiogram chest 01/13/2018  Chest x-ray 01/12/2018  2D echo 01/14/2018--- results pending  Lower extremity  Doppler 01/13/2018      Consultations:  None  Discharge Exam: Vitals:   01/15/18 1337 01/15/18 1435  BP:  133/84  Pulse:  100  Resp:  20  Temp:  98 F (36.7 C)  SpO2: 94% 92%    General: NAD Cardiovascular: RRR Respiratory: CTAB  Discharge Instructions   Discharge Instructions    Diet Carb Modified   Complete by:  As directed    Increase activity slowly   Complete by:  As directed      Allergies as of 01/15/2018      Reactions   Other Anaphylaxis   McCormick's Rub (Food)   Beef-derived Products Itching   Alpha gal allergy   Pork-derived Products Hives   Alpha gal      Medication List    STOP taking these medications   famotidine 20 MG tablet Commonly known as:  PEPCID     TAKE these medications   amoxicillin-clavulanate 875-125 MG tablet Commonly known as:  AUGMENTIN Take 1 tablet by mouth every 12 (twelve) hours for 4 days.   blood glucose meter kit and supplies Dispense based on patient and insurance preference. Use up to four times daily as  directed. (FOR ICD-10 E10.9, E11.9).   diphenhydrAMINE 25 MG tablet Commonly known as:  BENADRYL Take 1 tablet (25 mg total) by mouth every 6 (six) hours. What changed:  when to take this   fluticasone 50 MCG/ACT nasal spray Commonly known as:  FLONASE Place 2 sprays into both nostrils daily. Start taking on:  7/00/1749   folic acid 1 MG tablet Commonly known as:  FOLVITE Take 1 tablet (1 mg total) by mouth daily. Start taking on:  01/16/2018   ipratropium-albuterol 0.5-2.5 (3) MG/3ML Soln Commonly known as:  DUONEB Take 3 mLs by nebulization every 2 (two) hours as needed. Use 3 times daily x 3 days, then every 2 hours as needed.   loratadine 10 MG tablet Commonly known as:  CLARITIN Take 1 tablet (10 mg total) by mouth daily. Start taking on:  01/16/2018   metFORMIN 500 MG tablet Commonly known as:  GLUCOPHAGE Take 1 tablet (500 mg total) by mouth 2 (two) times daily with a meal.    mometasone-formoterol 200-5 MCG/ACT Aero Commonly known as:  DULERA Inhale 2 puffs into the lungs 2 (two) times daily.   multivitamin with minerals Tabs tablet Take 1 tablet by mouth daily. Start taking on:  01/16/2018   nicotine 21 mg/24hr patch Commonly known as:  NICODERM CQ - dosed in mg/24 hours Place 1 patch (21 mg total) onto the skin daily. Start taking on:  01/16/2018   pantoprazole 40 MG tablet Commonly known as:  PROTONIX Take 1 tablet (40 mg total) by mouth daily at 6 (six) AM. Start taking on:  01/16/2018   predniSONE 20 MG tablet Commonly known as:  DELTASONE Take 1-3 tablets (20-60 mg total) by mouth daily with breakfast. Take 3 tablets (30m) daily x 2 days, then 2 tablets (47m daily x 3 days, then 1 tablet (2020mdaily x 3 days then stop. Start taking on:  01/16/2018   thiamine 100 MG tablet Take 1 tablet (100 mg total) by mouth daily. Start taking on:  01/16/2018   tiotropium 18 MCG inhalation capsule Commonly known as:  SPIRIVA Place 1 capsule (18 mcg total) into inhaler and inhale daily.            Durable Medical Equipment  (From admission, onward)        Start     Ordered   01/15/18 1219  For home use only DME Nebulizer machine  Once    Question:  Patient needs a nebulizer to treat with the following condition  Answer:  [place]D (chronic obstructive pulmonary disease) [place] 01/15/18 1218   01/15/18 1217  For home use only DME oxygen  Once    Question Answer Comment  Mode or (Route) Nasal cannula   Liters per Minute 2   Frequency Continuous (stationary and portable oxygen unit needed)   Oxygen conserving device Yes   Oxygen delivery system Gas      01/15/18 1216     Allergies  Allergen Reactions  . Other Anaphylaxis    McCormick's Rub (Food)  . Beef-Derived Products Itching    Alpha gal allergy  . Pork-Derived Pro[facility], general practicel   [place].   Specialty:   Internal Medicine Why:  Please keep this appointment Tuesday 02/03/18 at 1:00 PM. Please take discharge papers, photo ID and all medications with you to this appointment. Contact information: [address]  [phone]           The results of significant diagnostics from this hospitalization (including imaging, microbiology, ancillary and laboratory) are listed below for reference.    Significant Diagnostic Studies: Dg Chest 2 View  Result Date: 01/12/2018 CLINICAL DATA:  Increased dyspnea for a while with swelling of the legs. EXAM: CHEST - 2 VIEW COMPARISON:  12/16/2016 CXR and CT FINDINGS: Overlying cardiomegaly. Nonaneurysmal thoracic aorta. Pulmonary vascular congestion is noted. Both lungs are clear. The visualized skeletal structures are unremarkable. IMPRESSION: Mild pulmonary vascular congestion with borderline cardiomegaly. Electronically Signed   By: Ashley Royalty M.D.   On: 01/12/2018 23:11   Ct Head Wo Contrast  Result Date: 01/13/2018 CLINICAL DATA:  57 year old male with suspected acute CHF and COPD exacerbation. Near syncope, altered level consciousness. EXAM: CT HEAD WITHOUT CONTRAST TECHNIQUE: Contiguous axial images were obtained from the base of the skull through the vertex without intravenous contrast. COMPARISON:  None. FINDINGS: Brain: No midline shift, ventriculomegaly, mass effect, evidence of mass lesion, intracranial hemorrhage or evidence of cortically based acute infarction. Gray-white matter differentiation is within normal limits throughout the brain. Cerebral volume is within normal limits for age. Vascular: Mild Calcified atherosclerosis at the skull base. No suspicious intracranial vascular hyperdensity. Skull: Negative. Sinuses/Orbits: Mild mucosal thickening in the visible bilateral maxillary and right sphenoid sinuses. 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 Bilateral tympanic cavities and mastoids  are clear. Other: Visualized orbits and scalp soft tissues are within normal limits. IMPRESSION: 1.  Normal for age non contrast CT appearance of the brain. 2. Mild maxillary and right sphenoid sinus inflammation. Electronically Signed   By: Genevie Ann M.D.   On: 01/13/2018 15:57   Ct Angio Chest Pe W Or Wo Contrast  Result Date: 01/13/2018 CLINICAL DATA:  Shortness of breath. EXAM: CT ANGIOGRAPHY CHEST WITH CONTRAST TECHNIQUE: Multidetector CT imaging of the chest was performed using the standard protocol during bolus administration of intravenous contrast. Multiplanar CT image reconstructions and MIPs were obtained to evaluate the vascular anatomy. CONTRAST:  15m ISOVUE-370 IOPAMIDOL (ISOVUE-370) INJECTION 76% COMPARISON:  Radiographs of January 12, 2018.  CT scan of December 16, 2016. FINDINGS: Cardiovascular: No large central pulmonary embolus is noted. However, due to respiratory motion artifact, smaller peripheral pulmonary emboli in the lower lobe branches cannot be excluded on the basis of this exam. There is no evidence of thoracic aortic dissection or aneurysm. Normal cardiac size. No pericardial effusion is noted. Mediastinum/Nodes: No enlarged mediastinal, hilar, or axillary lymph nodes. Thyroid gland, trachea, and esophagus demonstrate no significant findings. Lungs/Pleura: No pneumothorax or pleural effusion is noted. Mild bibasilar subsegmental atelectasis is noted. Upper Abdomen: No acute abnormality. Musculoskeletal: No chest wall abnormality. No acute or significant osseous findings. Review of the MIP images confirms the above findings. IMPRESSION: No large central pulmonary embolus is noted. However, due to respiratory motion artifact, smaller peripheral pulmonary emboli in the lower lobe branches of the pulmonary arteries cannot be excluded on the basis of this exam. Mild bibasilar subsegmental atelectasis is noted. Electronically Signed   By: JMarijo Conception M.D.   On: 01/13/2018 16:02     Microbiology: No results found for this or any previous visit (from the past 240 hour(s)).   Labs: Basic Metabolic Panel: Recent Labs  Lab 01/13/18 0006 01/13/18 0423 01/14/18 0439 01/15/18 0450  NA 142 142 139 137  K 4.3 4.7 4.1 4.6  CL 95* 97* 95* 94*  CO2  --  35* 35* 34*  GLUCOSE 140* 201* 164* 230*  BUN 10 10 15 14   CREATININE 0.80 0.83 0.62 0.63  CALCIUM  --  8.6* 8.7* 8.9  MG  --  2.3  --   --    Liver Function Tests: Recent Labs  Lab 01/13/18 0423  AST 25  ALT 25  ALKPHOS 93  BILITOT 0.4  PROT 7.4  ALBUMIN 3.6   No results for input(s): LIPASE, AMYLASE in the last 168 hours. No results for input(s): AMMONIA in the last 168 hours. CBC: Recent Labs  Lab 01/12/18 2333 01/13/18 0006 01/13/18 0423 01/14/18 0439 01/15/18 0450  WBC 6.5  --  7.4 9.2 8.6  NEUTROABS 4.2  --  6.0  --   --   HGB 17.1* 18.0* 17.6* 16.3 16.3  HCT 53.9* 53.0* 56.8* 52.9* 52.3*  MCV 101.1*  --  102.7* 102.3* 100.8*  PLT 203  --  184 194 190   Cardiac Enzymes: Recent Labs  Lab 01/13/18 0423 01/13/18 0945  TROPONINI <0.03 <0.03   BNP: BNP (last 3 results) Recent Labs    01/12/18 2333 01/14/18 0439  BNP 43.1 115.2*    ProBNP (last 3 results) No results for input(s): PROBNP in the last 8760 hours.  CBG: Recent Labs  Lab 01/14/18 2123 01/15/18 0741 01/15/18 1154  GLUCAP 266* 219* 207*       Signed:  Irine Seal MD.  [facility] 01/15/2018, 2:53 PM

## 2018-01-15 NOTE — Progress Notes (Signed)
Physical Therapy Treatment Patient Details Name: Bryan CrockerJeffrey Mitchell MRN: [id number] DOB: 04-06-1961 Today's Date: 01/15/2018    History of Present Illness Pt is a 57 y.o. male with history of HTN who has not been to a doctor for many years and takes Lasix from his brother over the last 1 year for congestion, SOB and LE edema. He presented to the ER because of worsening lower extremity edema.     PT Comments    Pt sitting EOB on arrival.  Assisted with amb a great distance in hallway monitoring sats.  Required 2 lts to achieve therapeutic levels.    Follow Up Recommendations  No PT follow up;Supervision - Intermittent     Equipment Recommendations  None recommended by PT    Recommendations for Other Services       Precautions / Restrictions Precautions Precaution Comments: monitor sats Restrictions Weight Bearing Restrictions: No    Mobility  Bed Mobility               General bed mobility comments: EOB on arrival  Transfers Overall transfer level: Needs assistance Equipment used: None Transfers: Stand Pivot Transfers;Sit to/from Stand Sit to Stand: Supervision Stand pivot transfers: Supervision       General transfer comment: supervision for safety  Ambulation/Gait Ambulation/Gait assistance: Supervision Gait Distance (Feet): 345 Feet Assistive device: None Gait Pattern/deviations: Step-through pattern;Decreased stride length Gait velocity: WFL   General Gait Details: required 2 lts of oxygen during gait to achieve therapeutic levels 88% and greater   Stairs             Wheelchair Mobility    Modified Rankin (Stroke Patients Only)       Balance                                            Cognition Arousal/Alertness: Awake/alert Behavior During Therapy: WFL for tasks assessed/performed Overall Cognitive Status: Within Functional Limits for tasks assessed                                        Exercises       General Comments        Pertinent Vitals/Pain Pain Assessment: No/denies pain    Home Living                      Prior Function            PT Goals (current goals can now be found in the care plan section)      Frequency    Min 3X/week      PT Plan      Co-evaluation              AM-PAC PT "6 Clicks" Daily Activity  Outcome Measure  Difficulty turning over in bed (including adjusting bedclothes, sheets and blankets)?: A Little Difficulty moving from lying on back to sitting on the side of the bed? : A Little Difficulty sitting down on and standing up from a chair with arms (e.g., wheelchair, bedside commode, etc,.)?: None Help needed moving to and from a bed to chair (including a wheelchair)?: None Help needed walking in hospital room?: None Help needed climbing 3-5 steps with a railing? : A Little 6 Click Score: 21    End  of Session Equipment Utilized During Treatment: Oxygen Activity Tolerance: Patient tolerated treatment well Patient left: in bed;with call bell/phone within reach Nurse Communication: Mobility status PT Visit Diagnosis: Difficulty in walking, not elsewhere classified (R26.2)     Time: 1016-1040 PT Time Calculation (min) (ACUTE ONLY): 24 min  Charges:  $Gait Training: 8-22 mins $Therapeutic Activity: 8-22 mins                    G Codes:       Felecia Shelling  PTA [facility]  Acute  Rehab Pager      [phone]

## 2018-01-15 NOTE — Progress Notes (Signed)
RT came to administer scheduled neb txs. Patient in bed w/o distress at this time. Patient stated he is "getting a headache and to come back later." RT will continue to monitor patient.

## 2018-01-15 NOTE — Progress Notes (Signed)
Pt complains of 5/10 stabbing chest pain in the middle of chest. EKG obtained and placed in chart and NP on call notified. Chest pain resolved on its on within 5 minutes. Will continue to monitor.

## 2018-01-16 LAB — HEPATITIS PANEL, ACUTE
HCV Ab: <0.1 s/co ratio (ref 0.0–0.9)
HEP A IGM: NEGATIVE
Hep B C IgM: NEGATIVE
Hepatitis B Surface Ag: NEGATIVE

## 2018-02-03 ENCOUNTER — Ambulatory Visit (INDEPENDENT_AMBULATORY_CARE_PROVIDER_SITE_OTHER): Payer: Self-pay | Admitting: Family Medicine

## 2018-02-03 ENCOUNTER — Encounter: Payer: Self-pay | Admitting: Family Medicine

## 2018-02-03 VITALS — BP 140/90 | HR 90 | Temp 98.0°F | Ht 68.0 in | Wt 352.0 lb

## 2018-02-03 DIAGNOSIS — E118 Type 2 diabetes mellitus with unspecified complications: Secondary | ICD-10-CM

## 2018-02-03 DIAGNOSIS — R601 Generalized edema: Secondary | ICD-10-CM

## 2018-02-03 DIAGNOSIS — J449 Chronic obstructive pulmonary disease, unspecified: Secondary | ICD-10-CM

## 2018-02-03 DIAGNOSIS — R0602 Shortness of breath: Secondary | ICD-10-CM

## 2018-02-03 DIAGNOSIS — Z09 Encounter for follow-up examination after completed treatment for conditions other than malignant neoplasm: Secondary | ICD-10-CM

## 2018-02-03 DIAGNOSIS — R600 Localized edema: Secondary | ICD-10-CM

## 2018-02-03 LAB — POCT URINALYSIS DIP (MANUAL ENTRY)
Bilirubin, UA: NEGATIVE
Blood, UA: NEGATIVE
Glucose, UA: NEGATIVE mg/dL
Ketones, POC UA: NEGATIVE mg/dL
Leukocytes, UA: NEGATIVE
Nitrite, UA: NEGATIVE
Protein Ur, POC: NEGATIVE mg/dL
Spec Grav, UA: >=1.03 — AB (ref 1.010–1.025)
Urobilinogen, UA: 0.2 E.U./dL
pH, UA: 5 (ref 5.0–8.0)

## 2018-02-03 MED ORDER — ALBUTEROL SULFATE HFA 108 (90 BASE) MCG/ACT IN AERS: 2.0000 | INHALATION_SPRAY | Freq: Four times a day (QID) | RESPIRATORY_TRACT | 11 refills | Status: DC | PRN

## 2018-02-03 MED ORDER — FUROSEMIDE 40 MG PO TABS: 40.0000 mg | ORAL_TABLET | Freq: Every day | ORAL | 2 refills | Status: DC

## 2018-02-03 MED FILL — AMOX-CLAV 875-125 MG TABLET: 875-125 | 4 days supply | Qty: 8 | Fill #0

## 2018-02-03 MED FILL — FUROSEMIDE 40 MG TAB: 40 | 30 days supply | Qty: 30 | Fill #0

## 2018-02-03 MED FILL — TRUEplus LANCETS 28G MISC: 25 days supply | Qty: 100 | Fill #0

## 2018-02-03 MED FILL — !VENTOLIN HFA INHALER: 108 (90 BAS | 25 days supply | Qty: 1 | Fill #0

## 2018-02-03 MED FILL — IPRAT-ALBUT 0.5-3(2.5) MG/3: 0.5-2.5 (3) | 17 days supply | Qty: 360 | Fill #0

## 2018-02-03 MED FILL — !DULERA 200 MCG/5 MCG INH: 200-5 | 30 days supply | Qty: 13 | Fill #0

## 2018-02-03 MED FILL — !TRUE METRIX BLOOD GLUCOSE: 30 days supply | Qty: 1 | Fill #0

## 2018-02-03 MED FILL — PANTOPRAZOLE SOD DR 40 MG T: 40 | 30 days supply | Qty: 30 | Fill #0

## 2018-02-03 MED FILL — predniSONE 20 MG TABS: 20 | 8 days supply | Qty: 15 | Fill #0

## 2018-02-03 MED FILL — TRUE METRIX TEST STRIP: 25 days supply | Qty: 100 | Fill #0

## 2018-02-03 MED FILL — FLUTICASONE PROP 50 MCG SPR: 50 | 30 days supply | Qty: 16 | Fill #0

## 2018-02-03 NOTE — Progress Notes (Signed)
New Patient-Establish Care  Subjective:    Patient ID: Bryan Mitchell, male    DOB: 1960/09/10, 57 y.o.   MRN: [id number]   Chief Complaint  Patient presents with  . Establish Care   HPI  Past Medical History:  Diagnosis Date  . Allergic reaction 12/15/2016  . Hyperglycemia 12/2016  . Hypertension     Current Status: He is here today to establish care. He does have c/o bilateral peripheral edema.  He states that he has been taking his brother's Lasix.   He denies fevers, chills, fatigue, recent infections, weight loss, and night sweats. He has not had any headaches, visual changes,  and falls. No chest pain, heart palpitations reported. No reports of GI problems such as nausea, vomiting, diarrhea, and constipation. He has no reports of blood in stools, dysuria and hematuria. No depression or anxiety. He denies pain today.   Past Medical History:  Diagnosis Date  . Allergic reaction 12/15/2016  . Hyperglycemia 12/2016  . Hypertension     Family History  Problem Relation Age of Onset  . CAD Father   . CAD Brother     Social History   Socioeconomic History  . Marital status: Single    Spouse name: Not on file  . Number of children: Not on file  . Years of education: Not on file  . Highest education level: Not on file  Occupational History  . Not on file  Social Needs  . Financial resource strain: Not on file  . Food insecurity:    Worry: Not on file    Inability: Not on file  . Transportation needs:    Medical: Not on file    Non-medical: Not on file  Tobacco Use  . Smoking status: Current Every Day Smoker    Years: 40.00    Types: Cigars  . Smokeless tobacco: Never Used  Substance and Sexual Activity  . Alcohol use: Yes  . Drug use: No  . Sexual activity: Not on file  Lifestyle  . Physical activity:    Days per week: Not on file    Minutes per session: Not on file  . Stress: Not on file  Relationships  . Social connections:    Talks on phone: Not on  file    Gets together: Not on file    Attends religious service: Not on file    Active member of club or organization: Not on file    Attends meetings of clubs or organizations: Not on file    Relationship status: Not on file  . Intimate partner violence:    Fear of current or ex partner: Not on file    Emotionally abused: Not on file    Physically abused: Not on file    Forced sexual activity: Not on file  Other Topics Concern  . Not on file  Social History Narrative  . Not on file    Past Surgical History:  Procedure Laterality Date  . KNEE SURGERY Right     There is no immunization history on file for this patient.  Current Meds  Medication Sig  . aspirin 81 MG tablet Take 81 mg by mouth daily.  . diphenhydrAMINE (BENADRYL) 25 MG tablet Take 1 tablet (25 mg total) by mouth every 6 (six) hours. (Patient taking differently: Take 25 mg by mouth daily. )  . metFORMIN (GLUCOPHAGE) 500 MG tablet Take 1 tablet (500 mg total) by mouth 2 (two) times daily with a meal.  . mometasone-formoterol (DULERA)  200-5 MCG/ACT AERO Inhale 2 puffs into the lungs 2 (two) times daily.    Allergies  Allergen Reactions  . Other Anaphylaxis    McCormick's Rub (Food)  . Beef-Derived Products Itching    Alpha gal allergy  . Pork-Derived Photographerroducts Hives    Alpha gal   BP 140/90   Pulse 90   Temp 98 F (36.7 C) (Oral)   Ht 5\' 8"  (1.727 m)   Wt (!) 352 lb (159.7 kg)   SpO2 92%   BMI 53.52 kg/m   Review of Systems  Constitutional: Negative.   Respiratory: Positive for cough and shortness of breath.   Gastrointestinal: Positive for abdominal distention (Obese) and constipation.  Musculoskeletal: Negative.   Skin: Negative.   Neurological: Positive for dizziness, weakness and light-headedness.   Objective:   Physical Exam  Constitutional: He is oriented to person, place, and time. He appears well-developed and well-nourished.    HENT:  Head: Normocephalic and atraumatic.  Right Ear:  External ear normal.  Left Ear: External ear normal.  Nose: Nose normal.  Mouth/Throat: Oropharynx is clear and moist.  Eyes: Pupils are equal, round, and reactive to light. Conjunctivae and EOM are normal.  Neck: Normal range of motion. Neck supple.  Cardiovascular: Normal rate, regular rhythm, normal heart sounds and intact distal pulses.  Pulmonary/Chest: Breath sounds normal. He is in respiratory distress.  Abdominal: Soft. Bowel sounds are normal.  Musculoskeletal: Normal range of motion.  Neurological: He is alert and oriented to person, place, and time.  Skin: Skin is warm and dry. Capillary refill takes less than 2 seconds.  Psychiatric: He has a normal mood and affect. His behavior is normal. Judgment and thought content normal.  Nursing note and vitals reviewed.  Assessment & Plan:   1. Type 2 diabetes mellitus with complication, without long-term current use of insulin (HCC) Urinalysis is stable. He will continue Metformin as prescribed.  - POCT urinalysis dipstick  2. Chronic obstructive pulmonary disease, unspecified COPD type (HCC) - albuterol (PROVENTIL HFA;VENTOLIN HFA) 108 (90 Base) MCG/ACT inhaler; Inhale 2 puffs into the lungs every 6 (six) hours as needed for wheezing or shortness of breath.  Dispense: 1 Inhaler; Refill: 11  3. Generalized edema - furosemide (LASIX) 40 MG tablet; Take 1 tablet (40 mg total) by mouth daily.  Dispense: 30 tablet; Refill: 2  4. Edema of lower extremity + 3 bilateral peripheral edema. We will send Rx for Lasix.  - furosemide (LASIX) 40 MG tablet; Take 1 tablet (40 mg total) by mouth daily.  Dispense: 30 tablet; Refill: 2  5. Shortness of breath Stable today. We will send Rx for Albuterol inhaler today.  - albuterol (PROVENTIL HFA;VENTOLIN HFA) 108 (90 Base) MCG/ACT inhaler; Inhale 2 puffs into the lungs every 6 (six) hours as needed for wheezing or shortness of breath.  Dispense: 1 Inhaler; Refill: 11  6. Follow up He will follow  up in 2 weeks.   Meds ordered this encounter  Medications  . furosemide (LASIX) 40 MG tablet    Sig: Take 1 tablet (40 mg total) by mouth daily.    Dispense:  30 tablet    Refill:  2  . albuterol (PROVENTIL HFA;VENTOLIN HFA) 108 (90 Base) MCG/ACT inhaler    Sig: Inhale 2 puffs into the lungs every 6 (six) hours as needed for wheezing or shortness of breath.    Dispense:  1 Inhaler    Refill:  11   Raliegh IpNatalie Sami Roes,  MSN, FNP-C [facility]  [facility] [address] [place], [place] [address] [phone]

## 2018-02-05 ENCOUNTER — Telehealth: Payer: Self-pay | Admitting: Family Medicine

## 2018-02-05 NOTE — Telephone Encounter (Signed)
Pt was called to informed to contact Mrs Bryan Mitchell at [phone] since he is medicaid pending and need to find out the update on his medicaid application, before he can show to the financial appt for [facility]

## 2018-02-11 ENCOUNTER — Other Ambulatory Visit: Payer: Self-pay

## 2018-02-11 ENCOUNTER — Ambulatory Visit: Payer: MEDICAID | Attending: Family Medicine

## 2018-02-11 MED ORDER — TIOTROPIUM BROMIDE MONOHYDRATE 18 MCG IN CAPS: 18.0000 ug | ORAL_CAPSULE | Freq: Every day | RESPIRATORY_TRACT | 0 refills | Status: DC

## 2018-02-11 MED ORDER — UMECLIDINIUM BROMIDE 62.5 MCG/INH IN AEPB: 1.0000 | INHALATION_SPRAY | Freq: Every day | RESPIRATORY_TRACT | 1 refills | Status: DC

## 2018-02-11 MED FILL — !INCRUSE ELLIPTA 62.5 MCG I: 62.5 | 30 days supply | Qty: 30 | Fill #0

## 2018-02-11 NOTE — Telephone Encounter (Signed)
Medication sent to pharmacy  

## 2018-02-13 ENCOUNTER — Encounter: Payer: Self-pay | Admitting: Family Medicine

## 2018-02-13 ENCOUNTER — Ambulatory Visit (INDEPENDENT_AMBULATORY_CARE_PROVIDER_SITE_OTHER): Payer: Self-pay | Admitting: Family Medicine

## 2018-02-13 VITALS — BP 112/78 | HR 84 | Temp 98.7°F | Ht 68.0 in | Wt 351.0 lb

## 2018-02-13 DIAGNOSIS — E118 Type 2 diabetes mellitus with unspecified complications: Secondary | ICD-10-CM

## 2018-02-13 DIAGNOSIS — M25552 Pain in left hip: Secondary | ICD-10-CM

## 2018-02-13 DIAGNOSIS — Z23 Encounter for immunization: Secondary | ICD-10-CM

## 2018-02-13 DIAGNOSIS — Z09 Encounter for follow-up examination after completed treatment for conditions other than malignant neoplasm: Secondary | ICD-10-CM

## 2018-02-13 DIAGNOSIS — M25551 Pain in right hip: Secondary | ICD-10-CM

## 2018-02-13 LAB — POCT URINALYSIS DIP (MANUAL ENTRY)
Bilirubin, UA: NEGATIVE
Blood, UA: NEGATIVE
Glucose, UA: NEGATIVE mg/dL
Ketones, POC UA: NEGATIVE mg/dL
Leukocytes, UA: NEGATIVE
Nitrite, UA: NEGATIVE
Protein Ur, POC: NEGATIVE mg/dL
Spec Grav, UA: 1.025 (ref 1.010–1.025)
Urobilinogen, UA: 1 E.U./dL
pH, UA: 5.5 (ref 5.0–8.0)

## 2018-02-13 LAB — POCT GLYCOSYLATED HEMOGLOBIN (HGB A1C): Hemoglobin A1C: 7.5 % — AB (ref 4.0–5.6)

## 2018-02-13 LAB — GLUCOSE, POCT (MANUAL RESULT ENTRY): POC Glucose: 248 mg/dl — AB (ref 70–99)

## 2018-02-13 MED ORDER — TRAMADOL HCL 50 MG PO TABS: 50.0000 mg | ORAL_TABLET | Freq: Three times a day (TID) | ORAL | 0 refills | Status: DC | PRN

## 2018-02-13 MED FILL — traMADol HCL 50 MG TABS: 50 | 10 days supply | Qty: 30 | Fill #0

## 2018-02-13 NOTE — Patient Instructions (Signed)
Tramadol tablets What is this medicine? TRAMADOL (TRA ma dole) is a pain reliever. It is used to treat moderate to severe pain in adults. This medicine may be used for other purposes; ask your health care provider or pharmacist if you have questions. COMMON BRAND NAME(S): Ultram What should I tell my health care provider before I take this medicine? They need to know if you have any of these conditions: -brain tumor -depression -drug abuse or addiction -head injury -if you frequently drink alcohol containing drinks -kidney disease or trouble passing urine -liver disease -lung disease, asthma, or breathing problems -seizures or epilepsy -suicidal thoughts, plans, or attempt; a previous suicide attempt by you or a family member -an unusual or allergic reaction to tramadol, codeine, other medicines, foods, dyes, or preservatives -pregnant or trying to get pregnant -breast-feeding How should I use this medicine? Take this medicine by mouth with a full glass of water. Follow the directions on the prescription label. You can take it with or without food. If it upsets your stomach, take it with food. Do not take your medicine more often than directed. A special MedGuide will be given to you by the pharmacist with each prescription and refill. Be sure to read this information carefully each time. Talk to your pediatrician regarding the use of this medicine in children. Special care may be needed. Overdosage: If you think you have taken too much of this medicine contact a poison control center or emergency room at once. NOTE: This medicine is only for you. Do not share this medicine with others. What if I miss a dose? If you miss a dose, take it as soon as you can. If it is almost time for your next dose, take only that dose. Do not take double or extra doses. What may interact with this medicine? Do not take this medication with any of the following medicines: -MAOIs like Carbex, Eldepryl,  Marplan, Nardil, and Parnate This medicine may also interact with the following medications: -alcohol -antihistamines for allergy, cough and cold -certain medicines for anxiety or sleep -certain medicines for depression like amitriptyline, fluoxetine, sertraline -certain medicines for migraine headache like almotriptan, eletriptan, frovatriptan, naratriptan, rizatriptan, sumatriptan, zolmitriptan -certain medicines for seizures like carbamazepine, oxcarbazepine, phenobarbital, primidone -certain medicines that treat or prevent blood clots like warfarin -digoxin -furazolidone -general anesthetics like halothane, isoflurane, methoxyflurane, propofol -linezolid -local anesthetics like lidocaine, pramoxine, tetracaine -medicines that relax muscles for surgery -other narcotic medicines for pain or cough -phenothiazines like chlorpromazine, mesoridazine, prochlorperazine, thioridazine -procarbazine This list may not describe all possible interactions. Give your health care provider a list of all the medicines, herbs, non-prescription drugs, or dietary supplements you use. Also tell them if you smoke, drink alcohol, or use illegal drugs. Some items may interact with your medicine. What should I watch for while using this medicine? Tell your doctor or health care professional if your pain does not go away, if it gets worse, or if you have new or a different type of pain. You may develop tolerance to the medicine. Tolerance means that you will need a higher dose of the medicine for pain relief. Tolerance is normal and is expected if you take this medicine for a long time. Do not suddenly stop taking your medicine because you may develop a severe reaction. Your body becomes used to the medicine. This does NOT mean you are addicted. Addiction is a behavior related to getting and using a drug for a non-medical reason. If you have pain, you   have a medical reason to take pain medicine. Your doctor will tell  you how much medicine to take. If your doctor wants you to stop the medicine, the dose will be slowly lowered over time to avoid any side effects. There are different types of narcotic medicines (opiates). If you take more than one type at the same time or if you are taking another medicine that also causes drowsiness, you may have more side effects. Give your health care provider a list of all medicines you use. Your doctor will tell you how much medicine to take. Do not take more medicine than directed. Call emergency for help if you have problems breathing or unusual sleepiness. You may get drowsy or dizzy. Do not drive, use machinery, or do anything that needs mental alertness until you know how this medicine affects you. Do not stand or sit up quickly, especially if you are an older patient. This reduces the risk of dizzy or fainting spells. Alcohol can increase or decrease the effects of this medicine. Avoid alcoholic drinks. You may have constipation. Try to have a bowel movement at least every 2 to 3 days. If you do not have a bowel movement for 3 days, call your doctor or health care professional. Your mouth may get dry. Chewing sugarless gum or sucking hard candy, and drinking plenty of water may help. Contact your doctor if the problem does not go away or is severe. What side effects may I notice from receiving this medicine? Side effects that you should report to your doctor or health care professional as soon as possible: -allergic reactions like skin rash, itching or hives, swelling of the face, lips, or tongue -breathing problems -confusion -seizures -signs and symptoms of low blood pressure like dizziness; feeling faint or lightheaded, falls; unusually weak or tired -trouble passing urine or change in the amount of urine Side effects that usually do not require medical attention (report to your doctor or health care professional if they continue or are bothersome): -constipation -dry  mouth -nausea, vomiting -tiredness This list may not describe all possible side effects. Call your doctor for medical advice about side effects. You may report side effects to FDA at 1-800-FDA-1088. Where should I keep my medicine? Keep out of the reach of children. This medicine may cause accidental overdose and death if it taken by other adults, children, or pets. Mix any unused medicine with a substance like cat litter or coffee grounds. Then throw the medicine away in a sealed container like a sealed bag or a coffee can with a lid. Do not use the medicine after the expiration date. Store at room temperature between 15 and 30 degrees C (59 and 86 degrees F). NOTE: This sheet is a summary. It may not cover all possible information. If you have questions about this medicine, talk to your doctor, pharmacist, or health care provider.  2018 Elsevier/Gold Standard (2015-03-19 09:00:04)  

## 2018-02-13 NOTE — Progress Notes (Signed)
Follow Up  Subjective:    Patient ID: Bryan Mitchell, male    DOB: 1960-12-05, 57 y.o.   MRN: [id number]   Chief Complaint  Patient presents with  . Follow-up   HPI Bryan Mitchell has a past medical history of Hypertension and Hyperglycemia. He is here today for follow up.  Current Status: Since his last office visit, he is doing well with no complaints.   He denies fevers, chills, fatigue, recent infections, weight loss, and night sweats. He has not had any headaches, visual changes, dizziness, and falls.   No chest pain, heart palpitations, cough and shortness of breath reported.   No reports of GI problems such as nausea, vomiting, diarrhea, and constipation. He has no reports of blood in stools, dysuria and hematuria.   No depression or anxiety, and denies suicidal ideations, homicidal ideations, or auditory hallucinations.   He reports bilateral hip pain today.   Past Medical History:  Diagnosis Date  . Allergic reaction 12/15/2016  . Hyperglycemia 12/2016  . Hypertension    Family History  Problem Relation Age of Onset  . CAD Father   . CAD Brother     Social History   Socioeconomic History  . Marital status: Single    Spouse name: Not on file  . Number of children: Not on file  . Years of education: Not on file  . Highest education level: Not on file  Occupational History  . Not on file  Social Needs  . Financial resource strain: Not on file  . Food insecurity:    Worry: Not on file    Inability: Not on file  . Transportation needs:    Medical: Not on file    Non-medical: Not on file  Tobacco Use  . Smoking status: Never Smoker  . Smokeless tobacco: Never Used  Substance and Sexual Activity  . Alcohol use: Yes  . Drug use: No  . Sexual activity: Not on file  Lifestyle  . Physical activity:    Days per week: Not on file    Minutes per session: Not on file  . Stress: Not on file  Relationships  . Social connections:    Talks on phone: Not on file     Gets together: Not on file    Attends religious service: Not on file    Active member of club or organization: Not on file    Attends meetings of clubs or organizations: Not on file    Relationship status: Not on file  . Intimate partner violence:    Fear of current or ex partner: Not on file    Emotionally abused: Not on file    Physically abused: Not on file    Forced sexual activity: Not on file  Other Topics Concern  . Not on file  Social History Narrative  . Not on file    Past Surgical History:  Procedure Laterality Date  . KNEE SURGERY Right    Immunization History  Administered Date(s) Administered  . Tdap 02/13/2018    Current Meds  Medication Sig  . albuterol (PROVENTIL HFA;VENTOLIN HFA) 108 (90 Base) MCG/ACT inhaler Inhale 2 puffs into the lungs every 6 (six) hours as needed for wheezing or shortness of breath.  Marland Kitchen aspirin 81 MG tablet Take 81 mg by mouth daily.  . blood glucose meter kit and supplies Dispense based on patient and insurance preference. Use up to four times daily as directed. (FOR ICD-10 E10.9, E11.9).  Marland Kitchen diphenhydrAMINE (BENADRYL) 25 MG  tablet Take 1 tablet (25 mg total) by mouth every 6 (six) hours. (Patient taking differently: Take 25 mg by mouth daily. )  . furosemide (LASIX) 40 MG tablet Take 1 tablet (40 mg total) by mouth daily.  . metFORMIN (GLUCOPHAGE) 500 MG tablet Take 1 tablet (500 mg total) by mouth 2 (two) times daily with a meal.  . mometasone-formoterol (DULERA) 200-5 MCG/ACT AERO Inhale 2 puffs into the lungs 2 (two) times daily.  . predniSONE (DELTASONE) 20 MG tablet Take 1-3 tablets (20-60 mg total) by mouth daily with breakfast. Take 3 tablets (23m) daily x 2 days, then 2 tablets (46m daily x 3 days, then 1 tablet (2029mdaily x 3 days then stop.  . uMarland Kitcheneclidinium bromide (INCRUSE ELLIPTA) 62.5 MCG/INH AEPB Inhale 1 puff into the lungs daily.    Allergies  Allergen Reactions  . Other Anaphylaxis    McCormick's Rub (Food)  .  Beef-Derived Products Itching    Alpha gal allergy  . Pork-Derived ProDoctor, general practicel    BP 112/78 (BP Location: Left Arm, Patient Position: Sitting, Cuff Size: Large)   Pulse 84   Temp 98.7 F (37.1 C) (Oral)   Ht _0  (1.727 m)   Wt (!) 351 lb (159.2 kg)   SpO2 94%   BMI 53.37 kg/m    Review of Systems  Eyes: Negative.   Respiratory: Negative.   Cardiovascular: Negative.   Musculoskeletal: Positive for back pain (Bilateral hip pain).   Objective:   Physical Exam  Constitutional: He appears well-developed and well-nourished.  Cardiovascular: Normal rate, regular rhythm, normal heart sounds and intact distal pulses.  Pulmonary/Chest: Effort normal and breath sounds normal.  Abdominal: He exhibits distension (Obese).  Musculoskeletal:  Limited ROM in back and hips  Nursing note and vitals reviewed.  Assessment & Plan:   1. Type 2 diabetes mellitus with complication, without long-term current use of insulin (HCC) Hgb A1c is stable at 7.5 today.  He will continue to decrease foods/beverages high in sugars and carbs and follow Heart Healthy or DASH diet. Increase physical activity to at least 30 minutes cardio exercise daily.   - POCT glycosylated hemoglobin (Hb A1C) - POCT urinalysis dipstick - POCT glucose (manual entry) - Microalbumin/Creatinine Ratio, Urine  2. Need for Tdap vaccination - Tdap vaccine greater than or equal to 7yo IM  3. Pain of both hip joints  We will initiate Ultram today. - traMADol (ULTRAM) 50 MG tablet; Take 1 tablet (50 mg total) by mouth every 8 (eight) hours as needed.  Dispense: 30 tablet; Refill: 0  4. Follow up He will follow up in 1 month.  Meds ordered this encounter  Medications  . traMADol (ULTRAM) 50 MG tablet    Sig: Take 1 tablet (50 mg total) by mouth every 8 (eight) hours as needed.    Dispense:  30 tablet    Refill:  0    Order Specific Question:   Supervising Provider    Answer:   JEGTresa [id number]] NatKathe BectonMSN, FNP-C [facility] [phone]

## 2018-02-14 LAB — MICROALBUMIN / CREATININE URINE RATIO
Creatinine, Urine: 92.7 mg/dL
Microalb/Creat Ratio: 3.5 mg/g creat (ref 0.0–30.0)
Microalbumin, Urine: 3.2 ug/mL

## 2018-02-18 ENCOUNTER — Ambulatory Visit: Payer: Self-pay | Admitting: Family Medicine

## 2018-03-16 ENCOUNTER — Ambulatory Visit: Payer: Self-pay | Admitting: Family Medicine

## 2019-06-07 ENCOUNTER — Telehealth: Payer: Self-pay | Admitting: Hematology and Oncology

## 2019-06-07 NOTE — Telephone Encounter (Signed)
A new hem appt has been scheduled for Bryan Mitchell to see Dr. Lorenso Courier on 12/11 at 1pm.

## 2019-06-18 ENCOUNTER — Encounter: Payer: Self-pay | Admitting: Hematology and Oncology

## 2019-06-18 ENCOUNTER — Other Ambulatory Visit: Payer: Self-pay

## 2019-07-26 NOTE — Progress Notes (Signed)
[place] Telephone:([phone]   [phone]) [phone]  INITIAL CONSULT NOTE  Patient Care Team: Azzie Glatter, FNP as PCP - General (Family Medicine)  Hematological/Oncological History #Polycythemia, Likely Secondary 1)  12/29/2015: WBC 6.8, Hgb 18.3, Plt 250 2)  01/15/2018: WBC 8.6, Hgb 16.3, Plt 190 3)  04/19/2019: WBC 6.3, Hgb 20.3, MCV 103, Plt 204.  4) 07/28/2019: establish care with Dr. Lorenso Courier   CHIEF COMPLAINTS/PURPOSE OF CONSULTATION:  Polycythemia  HISTORY OF PRESENTING ILLNESS:  Bryan Mitchell 59 y.o. male with medical history significant for HTN, hypogonadism, COPD, morbid obesity and DM type II who presents for evaluation of polycythemia. The patient was referred from [facility].   On review of the previous records Bryan Mitchell has a polycythemia dating back to at least 12/29/2015.  At that time he was noted to have a hemoglobin 18.3 with a white blood cell count of 6.8 and platelets of 250.  Our earliest hemoglobin on record is from 04/21/2012 at which time he was noted to have a hemoglobin of 17.7 which was within the normal reference range at that time.  Furthermore he has consistently had a hemoglobin ranging between 17 and 18 up until at least 2020.  On his most recent lab assessment on 04/19/2019 the patient was found to have a white blood cell count of 6.3 hemoglobin of 20.3 and a platelet count of 204.  He was referred to hematology for further evaluation and management.  On exam today Bryan Mitchell notes that he feels well.  He reports that he has not had any issues recently with shortness of breath, chest pain, or unintentional weight loss.  He reports that he was taking testosterone shots for a little over a month but quit that approximately 1 month ago.  He notes that he does not have any issues with bleeding or bruising.  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  When asked about his sleep habits he notes that he sleeps only throughout the night and does not have daytime somnolence.  He reports that no one else lives in his house and is therefore unable to tell if he has issues with snoring.  On further discussion he reports that he has a small lesion on his left thigh which is raised and he has been continually scratching it off.  He also notes that he has periodic bright red blood with his bowel movements, noted in the toilet bowl as well as copious amounts on the toilet paper.  He denies having any lightheadedness, fatigue, chills, sweats, or fever.  Full 10 point ROS is listed below.  MEDICAL HISTORY:  Past Medical History:  Diagnosis Date   Allergic reaction 12/15/2016   Hyperglycemia 12/2016   Hypertension     SURGICAL HISTORY: Past Surgical History:  Procedure Laterality Date   KNEE SURGERY Right     SOCIAL HISTORY: Social History   Socioeconomic History   Marital status: Single    Spouse name: Not on file   Number of children: Not on file   Years of education: Not on file   Highest education level: Not on file  Occupational History   Not on file  Tobacco Use   Smoking status: Current Every Day Smoker    Years: 40.00    Types: Cigars   Smokeless tobacco: Never  Used  Substance and Sexual Activity   Alcohol use: Yes   Drug use: No   Sexual activity: Not on file  Other Topics Concern   Not on file  Social History Narrative   Not on file   Social Determinants of Health   Financial Resource Strain:    Difficulty of Paying Living Expenses: Not on file  Food Insecurity:    Worried About [place] in the Last Year: Not on file   Ran Out of Food in the Last Year: Not on file  Transportation Needs:    Lack of Transportation (Medical): Not on file   Lack of Transportation (Non-Medical): Not on file  Physical Activity:      Days of Exercise per Week: Not on file   Minutes of Exercise per Session: Not on file  Stress:    Feeling of Stress : Not on file  Social Connections:    Frequency of Communication with Friends and Family: Not on file   Frequency of Social Gatherings with Friends and Family: Not on file   Attends Religious Services: Not on file   Active Member of Clubs or Organizations: Not on file   Attends Archivist Meetings: Not on file   Marital Status: Not on file  Intimate Partner Violence:    Fear of Current or Ex-Partner: Not on file   Emotionally Abused: Not on file   Physically Abused: Not on file   Sexually Abused: Not on file    FAMILY HISTORY: Family History  Problem Relation Age of Onset   CAD Father    CAD Brother     ALLERGIES:  is allergic to other; beef-derived products; and pork-derived products.  MEDICATIONS:  Current Outpatient Medications  Medication Sig Dispense Refill   buPROPion (WELLBUTRIN SR) 150 MG 12 hr tablet Take 150 mg by mouth 2 (two) times daily.     fluticasone (FLONASE) 50 MCG/ACT nasal spray Place 2 sprays into both nostrils daily. 16 g 0   albuterol (PROVENTIL HFA;VENTOLIN HFA) 108 (90 Base) MCG/ACT inhaler Inhale 2 puffs into the lungs every 6 (six) hours as needed for wheezing or shortness of breath. 1 Inhaler 11   aspirin 81 MG tablet Take 81 mg by mouth daily.     blood glucose meter kit and supplies Dispense based on patient and insurance preference. Use up to four times daily as directed. (FOR ICD-10 E10.9, E11.9). 1 each 0   diphenhydrAMINE (BENADRYL) 25 MG tablet Take 1 tablet (25 mg total) by mouth every 6 (six) hours. (Patient taking differently: Take 25 mg by mouth daily. ) 12 tablet 0   folic acid (FOLVITE) 1 MG tablet Take 1 tablet (1 mg total) by mouth daily. (Patient not taking: Reported on 02/03/2018)     furosemide (LASIX) 40 MG tablet Take 1 tablet (40 mg total) by mouth daily. 30 tablet 2    ipratropium-albuterol (DUONEB) 0.5-2.5 (3) MG/3ML SOLN Take 3 mLs by nebulization every 2 (two) hours as needed. Use 3 times daily x 3 days, then every 2 hours as needed. (Patient not taking: Reported on 02/03/2018) 360 mL 0   nicotine (NICODERM CQ - DOSED IN MG/24 HOURS) 21 mg/24hr patch Place 1 patch (21 mg total) onto the skin daily. (Patient not taking: Reported on 02/03/2018) 28 patch 0   traMADol (ULTRAM) 50 MG tablet Take 1 tablet (50 mg total) by mouth every 8 (eight) hours as needed. 30 tablet 0   umeclidinium bromide (INCRUSE ELLIPTA) 62.5  MCG/INH AEPB Inhale 1 puff into the lungs daily. (Patient not taking: Reported on 07/28/2019) 30 each 1   No current facility-administered medications for this visit.    REVIEW OF SYSTEMS:   Constitutional: ( - ) fevers, ( - )  chills , ( - ) night sweats Eyes: ( - ) blurriness of vision, ( - ) double vision, ( - ) watery eyes Ears, nose, mouth, throat, and face: ( - ) mucositis, ( - ) sore throat Respiratory: ( - ) cough, ( - ) dyspnea, ( - ) wheezes Cardiovascular: ( - ) palpitation, ( - ) chest discomfort, ( - ) lower extremity swelling Gastrointestinal:  ( - ) nausea, ( - ) heartburn, ( - ) change in bowel habits Skin: ( - ) abnormal skin rashes Lymphatics: ( - ) new lymphadenopathy, ( - ) easy bruising Neurological: ( - ) numbness, ( - ) tingling, ( - ) new weaknesses Behavioral/Psych: ( - ) mood change, ( - ) new changes  All other systems were reviewed with the patient and are negative.  PHYSICAL EXAMINATION: ECOG PERFORMANCE STATUS: 0 - Asymptomatic  Vitals:   07/28/19 1032  BP: 129/85  Pulse: 88  Resp: 16  Temp: (!) 97.5 F (36.4 C)  SpO2: 93%   Filed Weights   07/28/19 1032  Weight: (!) 314 lb 3.2 oz (142.5 kg)    GENERAL: well appearing middle aged Caucasian male, Appears older than stated age in NAD  SKIN: skin color, texture, turgor are normal, no rashes or significant lesions. Small sub-centimeter in diameter lesion on  the left thigh, brown in color with excoriation EYES: conjunctiva are pink and non-injected, sclera clear LUNGS: clear to auscultation and percussion with normal breathing effort HEART: regular rate & rhythm and no murmurs and no lower extremity edema ABDOMEN: exam limited due to body habitus.  Musculoskeletal: no cyanosis of digits and no clubbing  PSYCH: alert & oriented x 3, fluent speech NEURO: no focal motor/sensory deficits  LABORATORY DATA:  I have reviewed the data as listed CBC Latest Ref Rng & Units 07/28/2019 01/15/2018 01/14/2018  WBC 4.0 - 10.5 K/uL 6.6 8.6 9.2  Hemoglobin 13.0 - 17.0 g/dL 18.6(H) 16.3 16.3  Hematocrit 39.0 - 52.0 % 54.2(H) 52.3(H) 52.9(H)  Platelets 150 - 400 K/uL 221 190 194    CMP Latest Ref Rng & Units 07/28/2019 01/15/2018 01/14/2018  Glucose 70 - 99 mg/dL 96 230(H) 164(H)  BUN 6 - 20 mg/dL 11 14 15   Creatinine 0.61 - 1.24 mg/dL 0.78 0.63 0.62  Sodium 135 - 145 mmol/L 138 137 139  Potassium 3.5 - 5.1 mmol/L 4.1 4.6 4.1  Chloride 98 - 111 mmol/L 100 94(L) 95(L)  CO2 22 - 32 mmol/L 29 34(H) 35(H)  Calcium 8.9 - 10.3 mg/dL 9.3 8.9 8.7(L)  Total Protein 6.5 - 8.1 g/dL 7.9 - -  Total Bilirubin 0.3 - 1.2 mg/dL 0.7 - -  Alkaline Phos 38 - 126 U/L 88 - -  AST 15 - 41 U/L 21 - -  ALT 0 - 44 U/L 26 - -     PATHOLOGY: None to review.   BLOOD FILM: Review of the peripheral blood smear showed normal appearing white cells with neutrophils that were appropriately lobated and granulated. There was no predominance of bi-lobed or hyper-segmented neutrophils appreciated. No Dohle bodies were noted. There was no left shifting, immature forms or blasts noted. Lymphocytes remain normal in size without any predominance of large granular lymphocytes. Red cells show  no anisopoikilocytosis, macrocytes , microcytes or polychromasia. There were no schistocytes, target cells, echinocytes, acanthocytes, dacrocytes, or stomatocytes.There was no rouleaux formation, nucleated red  cells, or intra-cellular inclusions noted. The platelets are normal in size, shape, and color without any clumping evident.  RADIOGRAPHIC STUDIES: I have personally reviewed the radiological images as listed and agreed with the findings in the report. No results found.  ASSESSMENT & PLAN Bryan Mitchell 59 y.o. male with medical history significant for HTN, hypogonadism, COPD, morbid obesity and DM type II who presents for evaluation of polycythemia.  After review of the labs and discussion with the patient the findings are most consistent with a secondary polycythemia.  The possible etiologies for this are numerous in this patient and could include his history of smoking, his recent injections of testosterone shots, as well as possible obstructive sleep apnea with his increased neck diameter.  I would recommend that we initially began evaluation with a CBC CMP a peripheral blood film, as well as an erythropoietin level to determine if it is normal or elevated.  Given his numerous other possible etiologies I would recommend on holding further MPN testing at this time.  Also bone marrow biopsy is not indicated given his current findings.  Other health issues that he has brought up today include bright red blood per rectum as well as skin lesion on his left thigh.  I would recommend that we have him referred to gastroenterology for consideration of a screening colonoscopy as well as determine the etiology of his bleed.  Additionally we can have this skin lesion analyzed by dermatology given that it has only been present for approximately 6 months and that it is raised and atypical appearing.  #Polycythemia, Secondary  --today will order CMP, CBC, and peripheral blood film --additionally will order an erythropoeitin level to determine if this is secondary or primary --strongly encourage smoking cessation  --patient has an increased neck diameter, but does not have anyone staying with him that can comment  on snoring/sleep habits. He denies daytime somnolence or awaking abruptly at night. Recommend ordering a sleep study if his erythropoeitin is normal/high --no clear indication for MPN workup or bone marrow biopsy at this time. This can be considered if he was found to have a low erythropoeitin level or other workup was unrevealing. --RTC in 3 months to reassess or sooner if MPN workup is indicated.   Orders Placed This Encounter  Procedures   CBC with Differential ([place] Only)    Standing Status:   Future    Number of Occurrences:   1    Standing Expiration Date:   07/27/2020   CMP ([place] only)    Standing Status:   Future    Number of Occurrences:   1    Standing Expiration Date:   07/27/2020   Save Smear [facility])    Standing Status:   Future    Number of Occurrences:   1    Standing Expiration Date:   07/27/2020   Erythropoietin    Standing Status:   Future    Number of Occurrences:   1    Standing Expiration Date:   07/27/2020    All questions were answered. The patient knows to call the clinic with any problems, questions or concerns.  A total of more than 60 minutes were spent on this encounter and over half of that time was spent on counseling and coordination of care as outlined above.   Ledell Peoples, MD  Department of Hematology/Oncology [place] [facility] Phone: [phone] Pager: [phone] Email: [email or web address]  07/28/2019 1:32 PM

## 2019-07-28 ENCOUNTER — Other Ambulatory Visit: Payer: Self-pay

## 2019-07-28 ENCOUNTER — Encounter: Payer: Self-pay | Admitting: Hematology and Oncology

## 2019-07-28 ENCOUNTER — Inpatient Hospital Stay: Payer: Medicaid Other | Attending: Hematology and Oncology | Admitting: Hematology and Oncology

## 2019-07-28 ENCOUNTER — Inpatient Hospital Stay: Payer: Medicaid Other

## 2019-07-28 VITALS — BP 129/85 | HR 88 | Temp 97.5°F | Resp 16 | Ht 68.0 in | Wt 314.2 lb

## 2019-07-28 DIAGNOSIS — D751 Secondary polycythemia: Secondary | ICD-10-CM

## 2019-07-28 DIAGNOSIS — K625 Hemorrhage of anus and rectum: Secondary | ICD-10-CM | POA: Diagnosis not present

## 2019-07-28 DIAGNOSIS — Z7982 Long term (current) use of aspirin: Secondary | ICD-10-CM | POA: Insufficient documentation

## 2019-07-28 DIAGNOSIS — J449 Chronic obstructive pulmonary disease, unspecified: Secondary | ICD-10-CM | POA: Diagnosis not present

## 2019-07-28 DIAGNOSIS — E291 Testicular hypofunction: Secondary | ICD-10-CM | POA: Diagnosis not present

## 2019-07-28 DIAGNOSIS — I10 Essential (primary) hypertension: Secondary | ICD-10-CM | POA: Diagnosis not present

## 2019-07-28 DIAGNOSIS — L989 Disorder of the skin and subcutaneous tissue, unspecified: Secondary | ICD-10-CM | POA: Insufficient documentation

## 2019-07-28 DIAGNOSIS — E119 Type 2 diabetes mellitus without complications: Secondary | ICD-10-CM | POA: Diagnosis not present

## 2019-07-28 DIAGNOSIS — Z79899 Other long term (current) drug therapy: Secondary | ICD-10-CM | POA: Diagnosis not present

## 2019-07-28 LAB — CMP (CANCER CENTER ONLY)
ALT: 26 U/L (ref 0–44)
AST: 21 U/L (ref 15–41)
Albumin: 4.1 g/dL (ref 3.5–5.0)
Alkaline Phosphatase: 88 U/L (ref 38–126)
Anion gap: 9 (ref 5–15)
BUN: 11 mg/dL (ref 6–20)
CO2: 29 mmol/L (ref 22–32)
Calcium: 9.3 mg/dL (ref 8.9–10.3)
Chloride: 100 mmol/L (ref 98–111)
Creatinine: 0.78 mg/dL (ref 0.61–1.24)
GFR, Est AFR Am: >60 mL/min (ref >60)
GFR, Estimated: >60 mL/min (ref >60)
Glucose, Bld: 96 mg/dL (ref 70–99)
Potassium: 4.1 mmol/L (ref 3.5–5.1)
Sodium: 138 mmol/L (ref 135–145)
Total Bilirubin: 0.7 mg/dL (ref 0.3–1.2)
Total Protein: 7.9 g/dL (ref 6.5–8.1)

## 2019-07-28 LAB — CBC WITH DIFFERENTIAL (CANCER CENTER ONLY)
Abs Immature Granulocytes: 0.02 10*3/uL (ref 0.00–0.07)
Basophils Absolute: 0 10*3/uL (ref 0.0–0.1)
Basophils Relative: 1 %
Eosinophils Absolute: 0.2 10*3/uL (ref 0.0–0.5)
Eosinophils Relative: 2 %
HCT: 54.2 % — ABNORMAL HIGH (ref 39.0–52.0)
Hemoglobin: 18.6 g/dL — ABNORMAL HIGH (ref 13.0–17.0)
Immature Granulocytes: 0 %
Lymphocytes Relative: 23 %
Lymphs Abs: 1.5 10*3/uL (ref 0.7–4.0)
MCH: 32.2 pg (ref 26.0–34.0)
MCHC: 34.3 g/dL (ref 30.0–36.0)
MCV: 93.9 fL (ref 80.0–100.0)
Monocytes Absolute: 0.6 10*3/uL (ref 0.1–1.0)
Monocytes Relative: 10 %
Neutro Abs: 4.2 10*3/uL (ref 1.7–7.7)
Neutrophils Relative %: 64 %
Platelet Count: 221 10*3/uL (ref 150–400)
RBC: 5.77 MIL/uL (ref 4.22–5.81)
RDW: 12.9 % (ref 11.5–15.5)
WBC Count: 6.6 10*3/uL (ref 4.0–10.5)
nRBC: 0 % (ref 0.0–0.2)

## 2019-07-28 LAB — SAVE SMEAR(SSMR), FOR PROVIDER SLIDE REVIEW

## 2019-07-29 ENCOUNTER — Telehealth: Payer: Self-pay | Admitting: Hematology and Oncology

## 2019-07-29 LAB — ERYTHROPOIETIN: Erythropoietin: 8.1 m[IU]/mL (ref 2.6–18.5)

## 2019-07-29 NOTE — Telephone Encounter (Signed)
Scheduled per los. Called and left msg. Mailed printout  °

## 2019-07-30 ENCOUNTER — Other Ambulatory Visit: Payer: Self-pay | Admitting: Hematology and Oncology

## 2019-07-30 DIAGNOSIS — D751 Secondary polycythemia: Secondary | ICD-10-CM

## 2019-08-02 ENCOUNTER — Telehealth: Payer: Self-pay | Admitting: *Deleted

## 2019-08-02 NOTE — Telephone Encounter (Signed)
-----   Message from Jaci Standard, MD sent at 07/30/2019 10:43 AM EST ----- Please call Bryan Mitchell. Labs are consistent with high Hemoglobin from smoking or possibly obstructive sleep apnea. I have ordered a sleep study for him. Encourage smoking cessation. We will f/u with him in 3 months time.  Azucena Freed ----- Message ----- From: Leory Plowman, Lab In [place] Sent: 07/28/2019  11:44 AM EST To: Jaci Standard, MD

## 2019-08-02 NOTE — Telephone Encounter (Signed)
TCT patient regarding lab results.  Spoke with patient and reviewed his lab results and informed him of Dr. Derek Mound recommendations of stopping smoking and a referral for sleep study.  Pt voiced understanding.

## 2019-08-11 ENCOUNTER — Telehealth: Payer: Self-pay | Admitting: *Deleted

## 2019-08-11 NOTE — Telephone Encounter (Signed)
Received message from patient about his lab results from his appt in January.  TCT patient and spoke with him. Advised that I had called him on 08/02/19. He states he does not remember that. Reviewed labs with him and that Dr. Leonides Schanz advised that he quit smoking and that he has ordered a sleep study. Pt voiced understanding. Advised to call back if does not hear about his sleep study. He stated he would.

## 2019-10-14 IMAGING — CT CT HEAD W/O CM
3 series · 15 of 47 positions shown, 18 images · non-contrast
Comparison: None.

CLINICAL DATA: 56-year-old male with suspected acute CHF and COPD
exacerbation. Near syncope, altered level consciousness.

EXAM:
CT HEAD WITHOUT CONTRAST
TECHNIQUE: Contiguous axial images were obtained from the base of the skull
through the vertex without intravenous contrast.

[Series 2: head wo · axial · 0.44mm/px · z∈[-134,+11]mm · 9 of 35 slices shown, 12 images]
[im 3/35  brain]
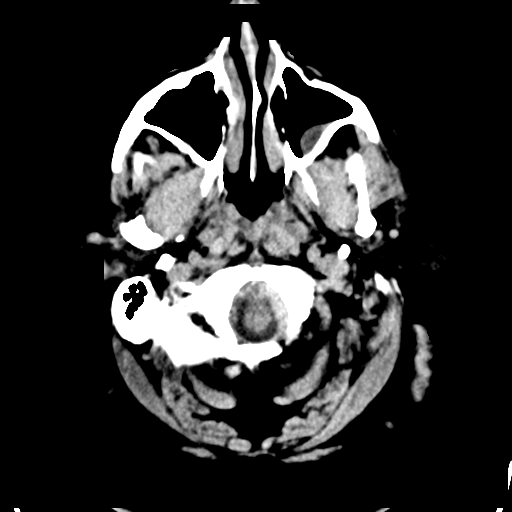
[im 3/35  bone]
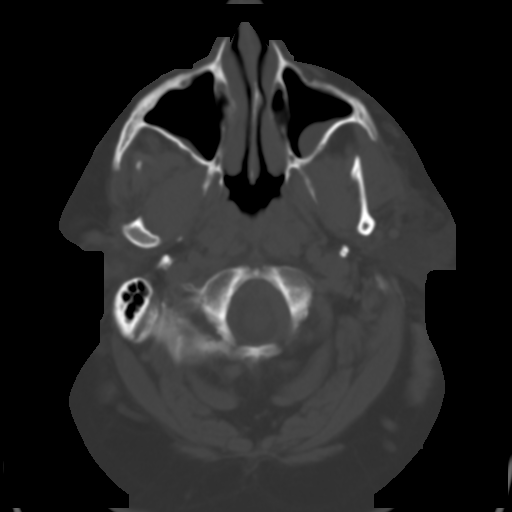
[im 6/35  brain]
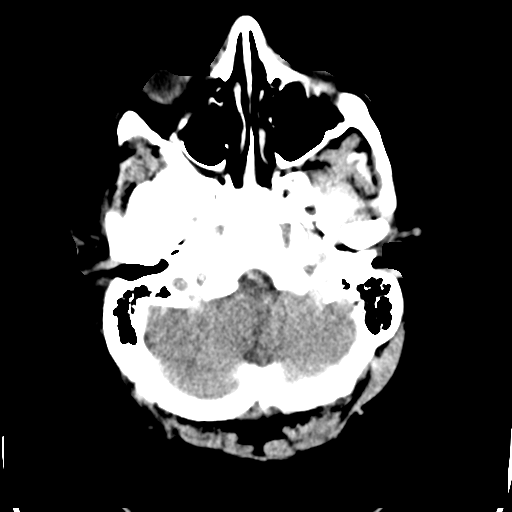
[im 10/35  brain]
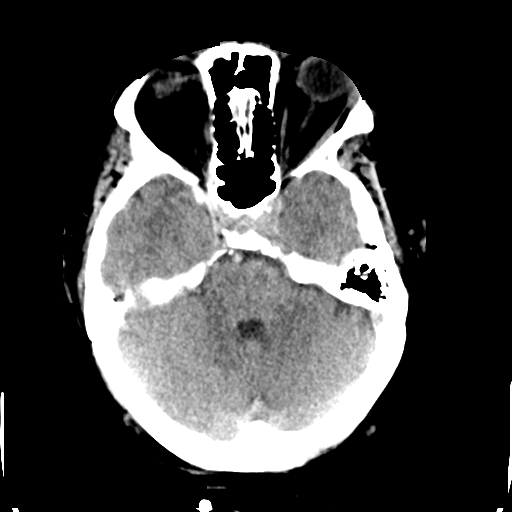
[im 13/35  brain]
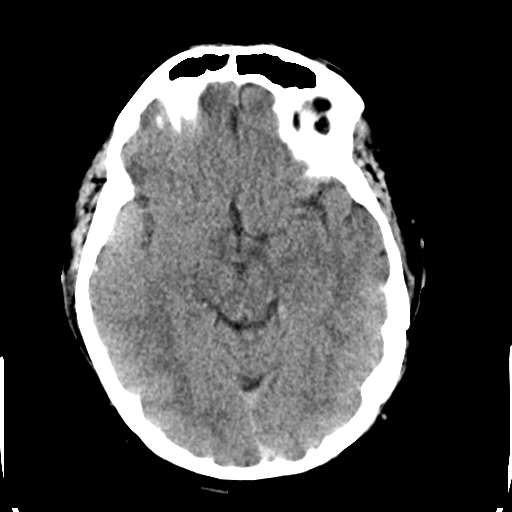
[im 18/35  brain]
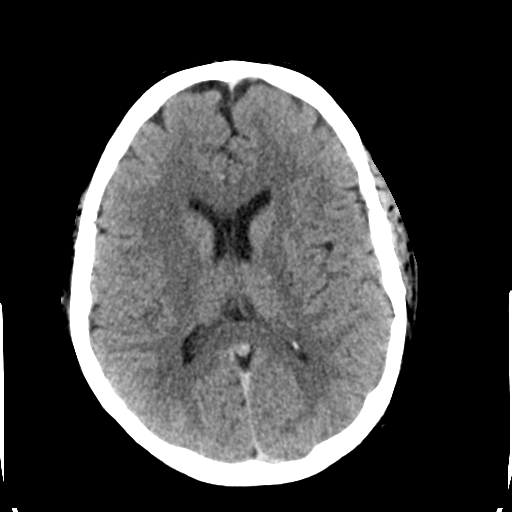
[im 18/35  bone]
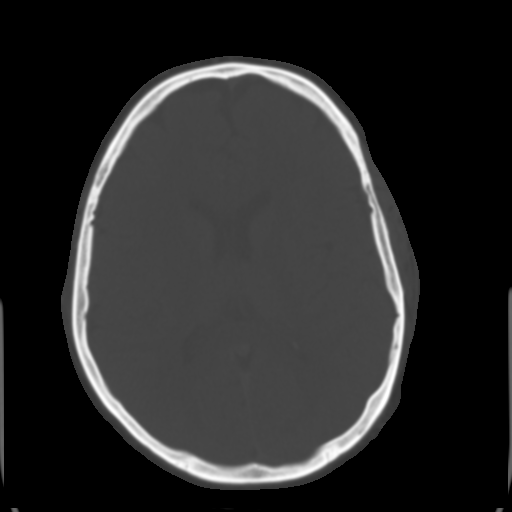
[im 22/35  brain]
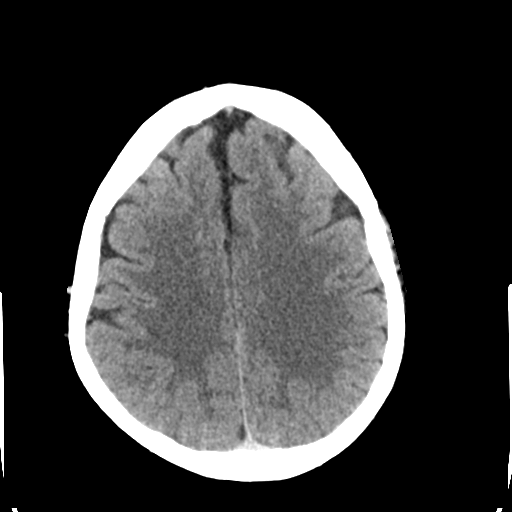
[im 25/35  brain]
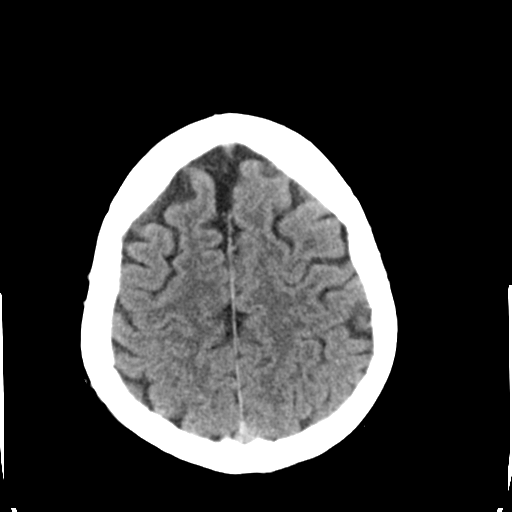
[im 29/35  brain]
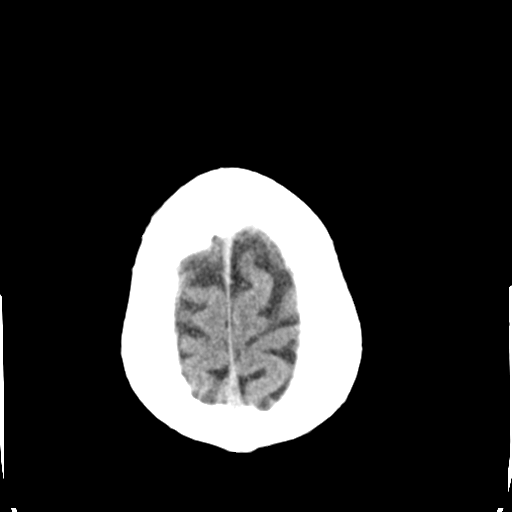
[im 32/35  brain]
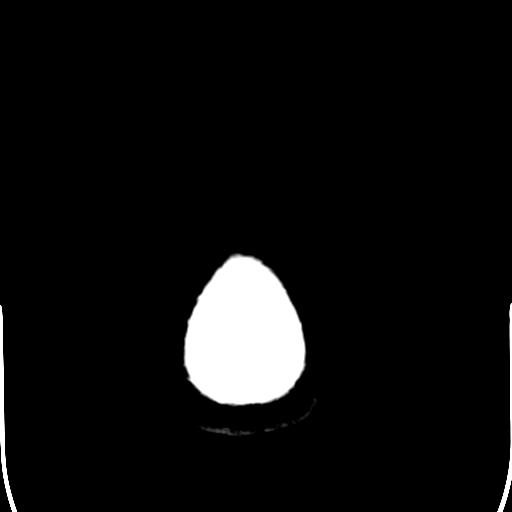
[im 32/35  bone]
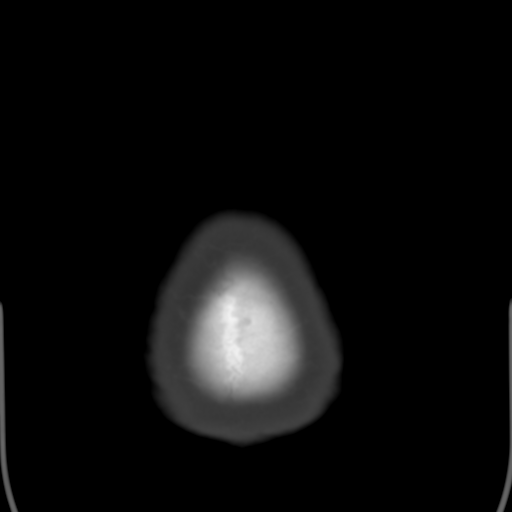

[Series 4: coronal soft tissue · coronal · 0.33mm/px · 3 of 69 slices shown]
[im 23/69  brain]
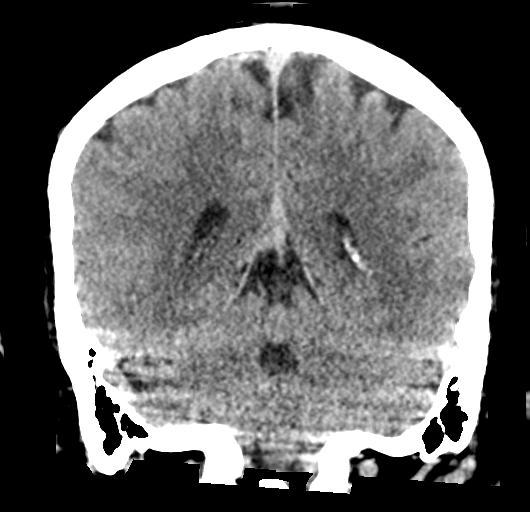
[im 31/69  brain]
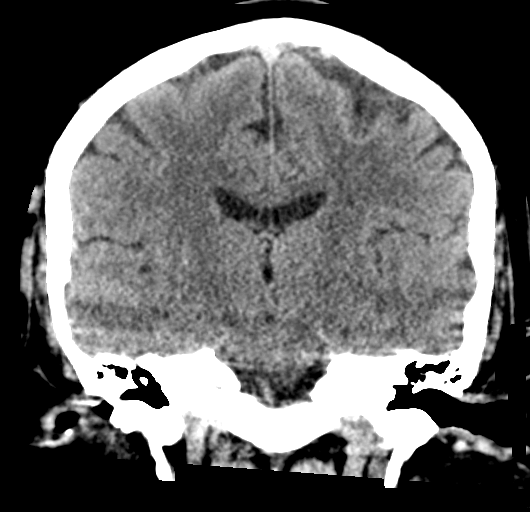
[im 38/69  brain]
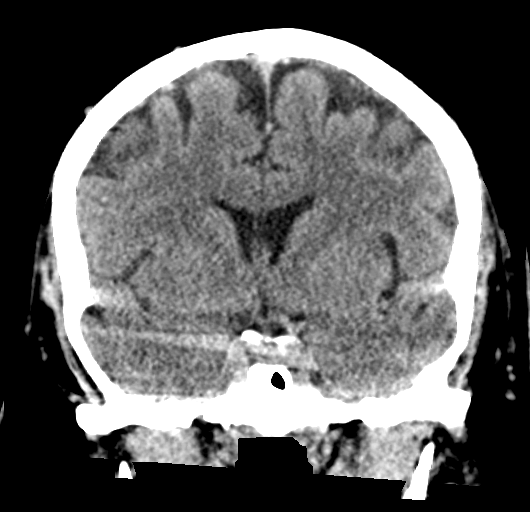

[Series 5: sagittal soft tissue · sagittal · 0.33mm/px · 3 of 55 slices shown]
[im 19/55  brain]
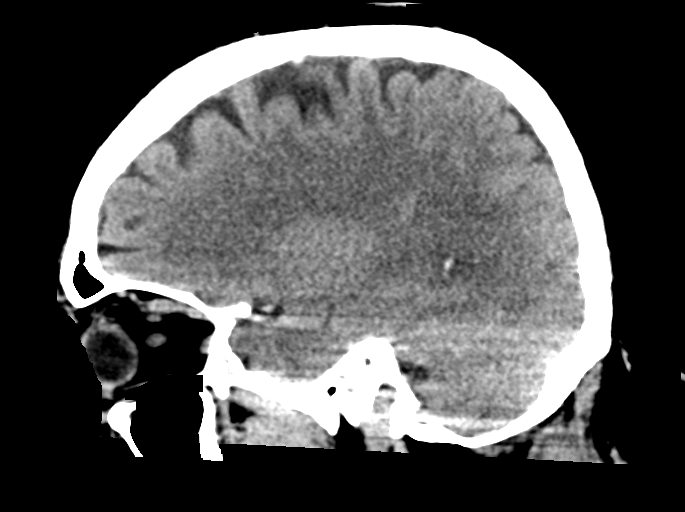
[im 28/55  brain]
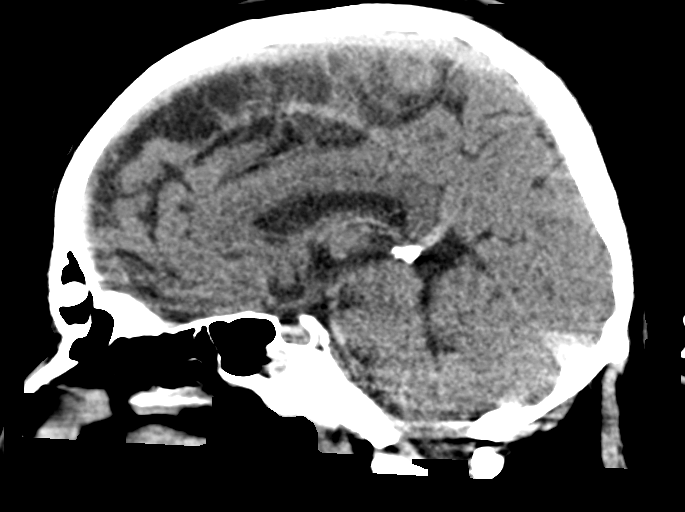
[im 37/55  brain]
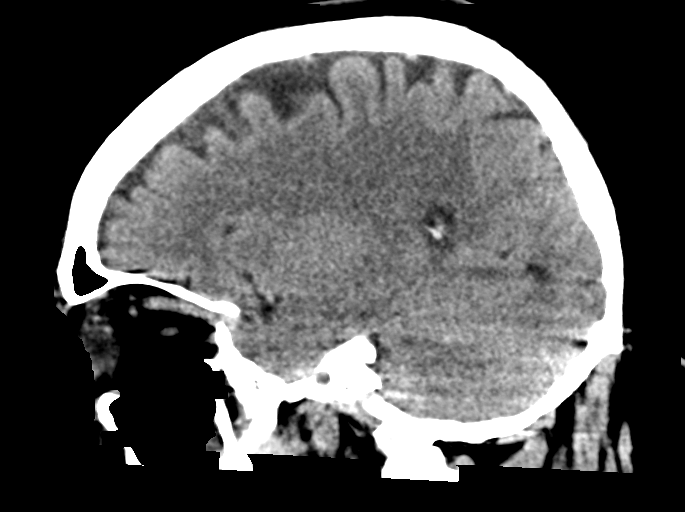

[15 of 47 positions shown; findings below may reference images not displayed]

FINDINGS: Brain: No midline shift, ventriculomegaly, mass effect, evidence of
mass lesion, intracranial hemorrhage or evidence of cortically based
acute infarction. Gray-white matter differentiation is within normal
limits throughout the brain. Cerebral volume is within normal limits
for age.

Vascular: Mild Calcified atherosclerosis at the skull base. No
suspicious intracranial vascular hyperdensity.

Skull: Negative.

Sinuses/Orbits: Mild mucosal thickening in the visible bilateral
maxillary and right sphenoid sinuses. Benign osteoma of the medial
right frontal sinus (normal variant). Bilateral tympanic cavities
and mastoids are clear.

Other: Visualized orbits and scalp soft tissues are within normal
limits.
IMPRESSION: 1.  Normal for age non contrast CT appearance of the brain.
2. Mild maxillary and right sphenoid sinus inflammation.

## 2019-10-26 ENCOUNTER — Other Ambulatory Visit: Payer: Self-pay | Admitting: Hematology and Oncology

## 2019-10-26 DIAGNOSIS — D751 Secondary polycythemia: Secondary | ICD-10-CM

## 2019-10-26 NOTE — Progress Notes (Signed)
[facility] [phone]   [phone]  PROGRESS NOTE  Patient Care Team: Azzie Glatter, FNP as PCP - General (Family Medicine)  Hematological/Oncological History #Polycythemia, Likely Secondary 1)  12/29/2015: WBC 6.8, Hgb 18.3, Plt 250 2)  01/15/2018: WBC 8.6, Hgb 16.3, Plt 190 3)  04/19/2019: WBC 6.3, Hgb 20.3, MCV 103, Plt 204.  4) 07/28/2019: establish care with Dr. Lorenso Courier. WBC 6.6, Hgb 18.6, MCV 93.9, Plt 221 5) 10/27/2019: WBC 6.2, Hgb 17.0, Plt 194, MCV 97.1   Interval History:  Bryan Mitchell 59 y.o. male with medical history significant for secondary polycythemia who presents for a follow up visit. The patient's last visit was on 07/28/2019 at which time he established care. In the interim since the last visit Bryan Mitchell has declined a sleep study.   On exam today Bryan Mitchell notes that he is at his baseline level of health.  He notes he is not had any major issues since we last saw him in clinic back in early January.  Vitals today show that Bryan Mitchell has increased in weight by approximately 5 pounds, though he notes he is down from his all-time high of 357 pounds.  He notes he is trying to cut back on smoking and that he predominately smokes cigars and that 1 pack will last approximately 1 week.  He smokes [place].  He notes he has not been taking his baby aspirin once daily, but is more agreeable to doing so today after speaking with our nurse.  He reports that he "does not eat much" and that is the reason why he has lost weight before in the past.  He has had some minor issues with constipation, but denies having any fevers, chills, sweats, nausea, vomiting today.  A full 10 point ROS is listed below.  In terms of undergoing a sleep study he notes that he does not "like to be bothered in his sleep" and therefore would not like to undergo sleep study evaluation.  He does endorse snoring and notes that "everyone in my family snores".  MEDICAL  HISTORY:  Past Medical History:  Diagnosis Date  . Allergic reaction 12/15/2016  . Hyperglycemia 12/2016  . Hypertension     SURGICAL HISTORY: Past Surgical History:  Procedure Laterality Date  . KNEE SURGERY Right     SOCIAL HISTORY: Social History   Socioeconomic History  . Marital status: Single    Spouse name: Not on file  . Number of children: Not on file  . Years of education: Not on file  . Highest education level: Not on file  Occupational History  . Not on file  Tobacco Use  . Smoking status: Current Every Day Smoker    Years: 40.00    Types: Cigars  . Smokeless tobacco: Never Used  Substance and Sexual Activity  . Alcohol use: Yes  . Drug use: No  . Sexual activity: Not on file  Other Topics Concern  . Not on file  Social History Narrative  . Not on file   Social Determinants of Health   Financial Resource Strain:   . Difficulty of Paying Living Expenses:   Food Insecurity:   . Worried About Charity fundraiser in the Last Year:   . Arboriculturist in the Last Year:   Transportation Needs:   . Film/video editor (Medical):   Marland Kitchen Lack of Transportation (Non-Medical):   Physical Activity:   . Days of Exercise per Week:   .  Minutes of Exercise per Session:   Stress:   . Feeling of Stress :   Social Connections:   . Frequency of Communication with Friends and Family:   . Frequency of Social Gatherings with Friends and Family:   . Attends Religious Services:   . Active Member of Clubs or Organizations:   . Attends Archivist Meetings:   Marland Kitchen Marital Status:   Intimate Partner Violence:   . Fear of Current or Ex-Partner:   . Emotionally Abused:   Marland Kitchen Physically Abused:   . Sexually Abused:     FAMILY HISTORY: Family History  Problem Relation Age of Onset  . CAD Father   . CAD Brother     ALLERGIES:  is allergic to other; beef-derived products; and pork-derived products.  MEDICATIONS:  Current Outpatient Medications  Medication  Sig Dispense Refill  . albuterol (PROVENTIL HFA;VENTOLIN HFA) 108 (90 Base) MCG/ACT inhaler Inhale 2 puffs into the lungs every 6 (six) hours as needed for wheezing or shortness of breath. 1 Inhaler 11  . aspirin 81 MG tablet Take 81 mg by mouth daily.    . blood glucose meter kit and supplies Dispense based on patient and insurance preference. Use up to four times daily as directed. (FOR ICD-10 E10.9, E11.9). 1 each 0  . buPROPion (WELLBUTRIN SR) 150 MG 12 hr tablet Take 150 mg by mouth 2 (two) times daily.    . diphenhydrAMINE (BENADRYL) 25 MG tablet Take 1 tablet (25 mg total) by mouth every 6 (six) hours. (Patient taking differently: Take 25 mg by mouth daily. ) 12 tablet 0  . fluticasone (FLONASE) 50 MCG/ACT nasal spray Place 2 sprays into both nostrils daily. 16 g 0  . folic acid (FOLVITE) 1 MG tablet Take 1 tablet (1 mg total) by mouth daily. (Patient not taking: Reported on 02/03/2018)    . furosemide (LASIX) 40 MG tablet Take 1 tablet (40 mg total) by mouth daily. 30 tablet 2  . ipratropium-albuterol (DUONEB) 0.5-2.5 (3) MG/3ML SOLN Take 3 mLs by nebulization every 2 (two) hours as needed. Use 3 times daily x 3 days, then every 2 hours as needed. (Patient not taking: Reported on 02/03/2018) 360 mL 0  . nicotine (NICODERM CQ - DOSED IN MG/24 HOURS) 21 mg/24hr patch Place 1 patch (21 mg total) onto the skin daily. (Patient not taking: Reported on 02/03/2018) 28 patch 0  . traMADol (ULTRAM) 50 MG tablet Take 1 tablet (50 mg total) by mouth every 8 (eight) hours as needed. 30 tablet 0  . umeclidinium bromide (INCRUSE ELLIPTA) 62.5 MCG/INH AEPB Inhale 1 puff into the lungs daily. (Patient not taking: Reported on 07/28/2019) 30 each 1   No current facility-administered medications for this visit.    REVIEW OF SYSTEMS:   Constitutional: ( - ) fevers, ( - )  chills , ( - ) night sweats Eyes: ( - ) blurriness of vision, ( - ) double vision, ( - ) watery eyes Ears, nose, mouth, throat, and face: ( - )  mucositis, ( - ) sore throat Respiratory: ( - ) cough, ( - ) dyspnea, ( - ) wheezes Cardiovascular: ( - ) palpitation, ( - ) chest discomfort, ( - ) lower extremity swelling Gastrointestinal:  ( - ) nausea, ( - ) heartburn, ( - ) change in bowel habits Skin: ( - ) abnormal skin rashes Lymphatics: ( - ) new lymphadenopathy, ( - ) easy bruising Neurological: ( - ) numbness, ( - ) tingling, ( - )  new weaknesses Behavioral/Psych: ( - ) mood change, ( - ) new changes  All other systems were reviewed with the patient and are negative.  PHYSICAL EXAMINATION: ECOG PERFORMANCE STATUS: 1 - Symptomatic but completely ambulatory  There were no vitals filed for this visit. There were no vitals filed for this visit.  GENERAL: well appearing middle aged 77 male, Appears older than stated age in NAD  SKIN: skin color, texture, turgor are normal, no rashes or significant lesions. Small sub-centimeter in diameter lesion on the left thigh, brown in color with excoriation EYES: conjunctiva are pink and non-injected, sclera clear LUNGS: clear to auscultation and percussion with normal breathing effort HEART: regular rate & rhythm and no murmurs and no lower extremity edema ABDOMEN: exam limited due to body habitus.  Musculoskeletal: no cyanosis of digits and no clubbing  PSYCH: alert & oriented x 3, fluent speech NEURO: no focal motor/sensory deficits  LABORATORY DATA:  I have reviewed the data as listed CBC Latest Ref Rng & Units 07/28/2019 01/15/2018 01/14/2018  WBC 4.0 - 10.5 K/uL 6.6 8.6 9.2  Hemoglobin 13.0 - 17.0 g/dL 18.6(H) 16.3 16.3  Hematocrit 39.0 - 52.0 % 54.2(H) 52.3(H) 52.9(H)  Platelets 150 - 400 K/uL 221 190 194    CMP Latest Ref Rng & Units 07/28/2019 01/15/2018 01/14/2018  Glucose 70 - 99 mg/dL 96 230(H) 164(H)  BUN 6 - 20 mg/dL 11 14 15   Creatinine 0.61 - 1.24 mg/dL 0.78 0.63 0.62  Sodium 135 - 145 mmol/L 138 137 139  Potassium 3.5 - 5.1 mmol/L 4.1 4.6 4.1  Chloride 98 - 111  mmol/L 100 94(L) 95(L)  CO2 22 - 32 mmol/L 29 34(H) 35(H)  Calcium 8.9 - 10.3 mg/dL 9.3 8.9 8.7(L)  Total Protein 6.5 - 8.1 g/dL 7.9 - -  Total Bilirubin 0.3 - 1.2 mg/dL 0.7 - -  Alkaline Phos 38 - 126 U/L 88 - -  AST 15 - 41 U/L 21 - -  ALT 0 - 44 U/L 26 - -    RADIOGRAPHIC STUDIES: None relevant to review.  No results found.  ASSESSMENT & PLAN Bryan Mitchell 59 y.o. male with medical history significant for secondary polycythemia who presents for a follow up visit.  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  The chronic hypoxia could either be due to his smoking, obstructive sleep apnea, or both.  Sleep study was ordered after his prior visit, however the patient declined to have this performed.  On reevaluation today the patient also notes that he does not wish to proceed with a sleep study.  He notes he has cut back on his smoking and does not tend to lose weight.  Given the relative stability of his hemoglobin and the patient's effort to lose weight and quit smoking I do not think it is necessary for him to routinely follow in our clinic.  I would encourage him to get a sleep study, however if he declines this weight loss should be a step in the right direction.  #Polycythemia, Secondary  --erythropoeitin levels are consistent with a secondary cause --strongly encourage smoking cessation  --patient has an increased neck diameter, but does not have anyone staying with him that can comment on snoring/sleep habits. He denies daytime somnolence or awaking abruptly at night. Recommend ordering a sleep study, however patient declined.  --no clear indication for MPN workup or bone marrow biopsy at this  time.  --please re-refer to our clinic in the event he were to develop worsening polycythemia or concerning cytopenias.  --RTC PRN.   No orders of the defined types were placed in  this encounter.  All questions were answered. The patient knows to call the clinic with any problems, questions or concerns.  A total of more than 30 minutes were spent on this encounter and over half of that time was spent on counseling and coordination of care as outlined above.   Bryan Peoples, MD Department of Hematology/Oncology [facility] Phone: [phone] Pager: [phone] Email: [email or web address]  10/26/2019 3:18 PM

## 2019-10-27 ENCOUNTER — Other Ambulatory Visit: Payer: Self-pay

## 2019-10-27 ENCOUNTER — Inpatient Hospital Stay: Payer: Medicaid Other | Attending: Hematology and Oncology | Admitting: Hematology and Oncology

## 2019-10-27 ENCOUNTER — Inpatient Hospital Stay: Payer: Medicaid Other

## 2019-10-27 VITALS — BP 139/88 | HR 88 | Temp 98.7°F | Resp 18 | Ht 68.0 in | Wt 319.1 lb

## 2019-10-27 DIAGNOSIS — Z720 Tobacco use: Secondary | ICD-10-CM | POA: Diagnosis not present

## 2019-10-27 DIAGNOSIS — D751 Secondary polycythemia: Secondary | ICD-10-CM

## 2019-10-27 DIAGNOSIS — Z79899 Other long term (current) drug therapy: Secondary | ICD-10-CM | POA: Diagnosis not present

## 2019-10-27 DIAGNOSIS — R0902 Hypoxemia: Secondary | ICD-10-CM | POA: Insufficient documentation

## 2019-10-27 DIAGNOSIS — G4733 Obstructive sleep apnea (adult) (pediatric): Secondary | ICD-10-CM | POA: Insufficient documentation

## 2019-10-27 DIAGNOSIS — F17210 Nicotine dependence, cigarettes, uncomplicated: Secondary | ICD-10-CM | POA: Insufficient documentation

## 2019-10-27 LAB — CBC WITH DIFFERENTIAL (CANCER CENTER ONLY)
Abs Immature Granulocytes: 0.02 10*3/uL (ref 0.00–0.07)
Basophils Absolute: 0 10*3/uL (ref 0.0–0.1)
Basophils Relative: 0 %
Eosinophils Absolute: 0.2 10*3/uL (ref 0.0–0.5)
Eosinophils Relative: 2 %
HCT: 51 % (ref 39.0–52.0)
Hemoglobin: 17 g/dL (ref 13.0–17.0)
Immature Granulocytes: 0 %
Lymphocytes Relative: 23 %
Lymphs Abs: 1.4 10*3/uL (ref 0.7–4.0)
MCH: 32.4 pg (ref 26.0–34.0)
MCHC: 33.3 g/dL (ref 30.0–36.0)
MCV: 97.1 fL (ref 80.0–100.0)
Monocytes Absolute: 0.6 10*3/uL (ref 0.1–1.0)
Monocytes Relative: 10 %
Neutro Abs: 3.9 10*3/uL (ref 1.7–7.7)
Neutrophils Relative %: 65 %
Platelet Count: 194 10*3/uL (ref 150–400)
RBC: 5.25 MIL/uL (ref 4.22–5.81)
RDW: 12.2 % (ref 11.5–15.5)
WBC Count: 6.2 10*3/uL (ref 4.0–10.5)
nRBC: 0 % (ref 0.0–0.2)

## 2019-10-28 ENCOUNTER — Encounter: Payer: Self-pay | Admitting: Hematology and Oncology

## 2022-02-23 ENCOUNTER — Emergency Department: Payer: Medicaid Other

## 2022-02-23 ENCOUNTER — Inpatient Hospital Stay: Payer: Medicaid Other

## 2022-02-23 ENCOUNTER — Encounter: Payer: Self-pay | Admitting: Emergency Medicine

## 2022-02-23 ENCOUNTER — Inpatient Hospital Stay
Admission: EM | Admit: 2022-02-23 | Discharge: 2022-04-12 | DRG: 291 | Disposition: A | Discharge status: Skilled Nursing Facility | Payer: Medicaid Other | Attending: Internal Medicine | Admitting: Internal Medicine

## 2022-02-23 DIAGNOSIS — L8990 Pressure ulcer of unspecified site, unspecified stage: Secondary | ICD-10-CM | POA: Insufficient documentation

## 2022-02-23 DIAGNOSIS — I110 Hypertensive heart disease with heart failure: Secondary | ICD-10-CM | POA: Diagnosis present

## 2022-02-23 DIAGNOSIS — F05 Delirium due to known physiological condition: Secondary | ICD-10-CM | POA: Diagnosis not present

## 2022-02-23 DIAGNOSIS — E781 Pure hyperglyceridemia: Secondary | ICD-10-CM | POA: Diagnosis present

## 2022-02-23 DIAGNOSIS — Z79899 Other long term (current) drug therapy: Secondary | ICD-10-CM

## 2022-02-23 DIAGNOSIS — Z751 Person awaiting admission to adequate facility elsewhere: Secondary | ICD-10-CM

## 2022-02-23 DIAGNOSIS — J9621 Acute and chronic respiratory failure with hypoxia: Secondary | ICD-10-CM | POA: Diagnosis present

## 2022-02-23 DIAGNOSIS — D751 Secondary polycythemia: Secondary | ICD-10-CM | POA: Diagnosis present

## 2022-02-23 DIAGNOSIS — Z791 Long term (current) use of non-steroidal anti-inflammatories (NSAID): Secondary | ICD-10-CM

## 2022-02-23 DIAGNOSIS — A4189 Other specified sepsis: Secondary | ICD-10-CM | POA: Diagnosis not present

## 2022-02-23 DIAGNOSIS — J9602 Acute respiratory failure with hypercapnia: Principal | ICD-10-CM

## 2022-02-23 DIAGNOSIS — H5702 Anisocoria: Secondary | ICD-10-CM

## 2022-02-23 DIAGNOSIS — J129 Viral pneumonia, unspecified: Secondary | ICD-10-CM | POA: Diagnosis not present

## 2022-02-23 DIAGNOSIS — J9622 Acute and chronic respiratory failure with hypercapnia: Secondary | ICD-10-CM | POA: Diagnosis present

## 2022-02-23 DIAGNOSIS — N390 Urinary tract infection, site not specified: Secondary | ICD-10-CM | POA: Diagnosis not present

## 2022-02-23 DIAGNOSIS — Z515 Encounter for palliative care: Secondary | ICD-10-CM | POA: Diagnosis not present

## 2022-02-23 DIAGNOSIS — G4733 Obstructive sleep apnea (adult) (pediatric): Secondary | ICD-10-CM | POA: Diagnosis present

## 2022-02-23 DIAGNOSIS — G9341 Metabolic encephalopathy: Secondary | ICD-10-CM | POA: Diagnosis not present

## 2022-02-23 DIAGNOSIS — I2489 Other forms of acute ischemic heart disease: Secondary | ICD-10-CM | POA: Diagnosis not present

## 2022-02-23 DIAGNOSIS — R6521 Severe sepsis with septic shock: Secondary | ICD-10-CM | POA: Diagnosis not present

## 2022-02-23 DIAGNOSIS — Z1152 Encounter for screening for COVID-19: Secondary | ICD-10-CM | POA: Diagnosis not present

## 2022-02-23 DIAGNOSIS — Z6841 Body Mass Index (BMI) 40.0 and over, adult: Secondary | ICD-10-CM | POA: Diagnosis not present

## 2022-02-23 DIAGNOSIS — Z7189 Other specified counseling: Secondary | ICD-10-CM | POA: Diagnosis not present

## 2022-02-23 DIAGNOSIS — I2721 Secondary pulmonary arterial hypertension: Secondary | ICD-10-CM | POA: Diagnosis present

## 2022-02-23 DIAGNOSIS — Z91199 Patient's noncompliance with other medical treatment and regimen due to unspecified reason: Secondary | ICD-10-CM

## 2022-02-23 DIAGNOSIS — M19072 Primary osteoarthritis, left ankle and foot: Secondary | ICD-10-CM | POA: Diagnosis present

## 2022-02-23 DIAGNOSIS — R480 Dyslexia and alexia: Secondary | ICD-10-CM | POA: Diagnosis present

## 2022-02-23 DIAGNOSIS — J441 Chronic obstructive pulmonary disease with (acute) exacerbation: Secondary | ICD-10-CM | POA: Diagnosis present

## 2022-02-23 DIAGNOSIS — E870 Hyperosmolality and hypernatremia: Secondary | ICD-10-CM | POA: Diagnosis not present

## 2022-02-23 DIAGNOSIS — R0602 Shortness of breath: Secondary | ICD-10-CM | POA: Diagnosis present

## 2022-02-23 DIAGNOSIS — Z720 Tobacco use: Secondary | ICD-10-CM | POA: Diagnosis present

## 2022-02-23 DIAGNOSIS — R339 Retention of urine, unspecified: Secondary | ICD-10-CM | POA: Diagnosis not present

## 2022-02-23 DIAGNOSIS — E118 Type 2 diabetes mellitus with unspecified complications: Secondary | ICD-10-CM | POA: Diagnosis present

## 2022-02-23 DIAGNOSIS — Z8249 Family history of ischemic heart disease and other diseases of the circulatory system: Secondary | ICD-10-CM

## 2022-02-23 DIAGNOSIS — I5033 Acute on chronic diastolic (congestive) heart failure: Secondary | ICD-10-CM | POA: Diagnosis present

## 2022-02-23 DIAGNOSIS — J9600 Acute respiratory failure, unspecified whether with hypoxia or hypercapnia: Secondary | ICD-10-CM | POA: Diagnosis present

## 2022-02-23 DIAGNOSIS — G928 Other toxic encephalopathy: Secondary | ICD-10-CM | POA: Diagnosis not present

## 2022-02-23 DIAGNOSIS — J90 Pleural effusion, not elsewhere classified: Secondary | ICD-10-CM

## 2022-02-23 DIAGNOSIS — J449 Chronic obstructive pulmonary disease, unspecified: Secondary | ICD-10-CM

## 2022-02-23 DIAGNOSIS — Z602 Problems related to living alone: Secondary | ICD-10-CM | POA: Diagnosis present

## 2022-02-23 DIAGNOSIS — B348 Other viral infections of unspecified site: Secondary | ICD-10-CM | POA: Diagnosis present

## 2022-02-23 DIAGNOSIS — Z7982 Long term (current) use of aspirin: Secondary | ICD-10-CM

## 2022-02-23 DIAGNOSIS — Z91148 Patient's other noncompliance with medication regimen for other reason: Secondary | ICD-10-CM

## 2022-02-23 DIAGNOSIS — L89896 Pressure-induced deep tissue damage of other site: Secondary | ICD-10-CM | POA: Diagnosis present

## 2022-02-23 DIAGNOSIS — M722 Plantar fascial fibromatosis: Secondary | ICD-10-CM | POA: Diagnosis not present

## 2022-02-23 DIAGNOSIS — Z993 Dependence on wheelchair: Secondary | ICD-10-CM

## 2022-02-23 DIAGNOSIS — Z5982 Transportation insecurity: Secondary | ICD-10-CM

## 2022-02-23 DIAGNOSIS — F17290 Nicotine dependence, other tobacco product, uncomplicated: Secondary | ICD-10-CM | POA: Diagnosis present

## 2022-02-23 DIAGNOSIS — Z789 Other specified health status: Secondary | ICD-10-CM | POA: Diagnosis not present

## 2022-02-23 DIAGNOSIS — J9601 Acute respiratory failure with hypoxia: Secondary | ICD-10-CM | POA: Diagnosis not present

## 2022-02-23 DIAGNOSIS — Z91018 Allergy to other foods: Secondary | ICD-10-CM

## 2022-02-23 DIAGNOSIS — I5031 Acute diastolic (congestive) heart failure: Secondary | ICD-10-CM | POA: Diagnosis not present

## 2022-02-23 HISTORY — DX: Secondary polycythemia: D75.1

## 2022-02-23 HISTORY — DX: Unspecified diastolic (congestive) heart failure: I50.30

## 2022-02-23 HISTORY — DX: Chronic obstructive pulmonary disease, unspecified: J44.9

## 2022-02-23 LAB — PROCALCITONIN: Procalcitonin: 0.22 ng/mL

## 2022-02-23 LAB — BASIC METABOLIC PANEL
Anion gap: 8 (ref 5–15)
BUN: 31 mg/dL — ABNORMAL HIGH (ref 6–20)
CO2: 32 mmol/L (ref 22–32)
Calcium: 8.3 mg/dL — ABNORMAL LOW (ref 8.9–10.3)
Chloride: 99 mmol/L (ref 98–111)
Creatinine, Ser: 0.79 mg/dL (ref 0.61–1.24)
GFR, Estimated: >60 mL/min (ref >60)
Glucose, Bld: 142 mg/dL — ABNORMAL HIGH (ref 70–99)
Potassium: 3.8 mmol/L (ref 3.5–5.1)
Sodium: 139 mmol/L (ref 135–145)

## 2022-02-23 LAB — CBC
HCT: 63.3 % — ABNORMAL HIGH (ref 39.0–52.0)
Hemoglobin: 18.9 g/dL — ABNORMAL HIGH (ref 13.0–17.0)
MCH: 28.3 pg (ref 26.0–34.0)
MCHC: 29.9 g/dL — ABNORMAL LOW (ref 30.0–36.0)
MCV: 94.9 fL (ref 80.0–100.0)
Platelets: 178 10*3/uL (ref 150–400)
RBC: 6.67 MIL/uL — ABNORMAL HIGH (ref 4.22–5.81)
RDW: 17.7 % — ABNORMAL HIGH (ref 11.5–15.5)
WBC: 6.9 10*3/uL (ref 4.0–10.5)
nRBC: 1.5 % — ABNORMAL HIGH (ref 0.0–0.2)

## 2022-02-23 LAB — BLOOD GAS, ARTERIAL
Acid-Base Excess: 9.7 mmol/L — ABNORMAL HIGH (ref 0.0–2.0)
Bicarbonate: 37.3 mmol/L — ABNORMAL HIGH (ref 20.0–28.0)
FIO2: 100 %
MECHVT: 500 mL
Mechanical Rate: 16
O2 Saturation: 99.9 %
PEEP: 8 cmH2O
Patient temperature: 37
pCO2 arterial: 63 mmHg — ABNORMAL HIGH (ref 32–48)
pH, Arterial: 7.38 (ref 7.35–7.45)
pO2, Arterial: 143 mmHg — ABNORMAL HIGH (ref 83–108)

## 2022-02-23 LAB — URINALYSIS, COMPLETE (UACMP) WITH MICROSCOPIC
Bacteria, UA: NONE SEEN
Bilirubin Urine: NEGATIVE
Glucose, UA: NEGATIVE mg/dL
Hgb urine dipstick: NEGATIVE
Ketones, ur: NEGATIVE mg/dL
Leukocytes,Ua: NEGATIVE
Nitrite: NEGATIVE
Protein, ur: NEGATIVE mg/dL
Specific Gravity, Urine: 1.038 — ABNORMAL HIGH (ref 1.005–1.030)
Squamous Epithelial / HPF: NONE SEEN (ref 0–5)
pH: 5 (ref 5.0–8.0)

## 2022-02-23 LAB — PHOSPHORUS: Phosphorus: 3.7 mg/dL (ref 2.5–4.6)

## 2022-02-23 LAB — D-DIMER, QUANTITATIVE: D-Dimer, Quant: 0.74 ug/mL-FEU — ABNORMAL HIGH (ref 0.00–0.50)

## 2022-02-23 LAB — RESP PANEL BY RT-PCR (FLU A&B, COVID) ARPGX2
Influenza A by PCR: NEGATIVE
Influenza B by PCR: NEGATIVE
SARS Coronavirus 2 by RT PCR: NEGATIVE

## 2022-02-23 LAB — BLOOD GAS, VENOUS
Acid-Base Excess: 11.8 mmol/L — ABNORMAL HIGH (ref 0.0–2.0)
Bicarbonate: 43.4 mmol/L — ABNORMAL HIGH (ref 20.0–28.0)
O2 Saturation: 85.5 %
Patient temperature: 37
pCO2, Ven: 99 mmHg (ref 44–60)
pH, Ven: 7.25 (ref 7.25–7.43)
pO2, Ven: 59 mmHg — ABNORMAL HIGH (ref 32–45)

## 2022-02-23 LAB — TROPONIN I (HIGH SENSITIVITY): Troponin I (High Sensitivity): 59 ng/L — ABNORMAL HIGH (ref <18)

## 2022-02-23 LAB — MAGNESIUM: Magnesium: 2.2 mg/dL (ref 1.7–2.4)

## 2022-02-23 LAB — CBG MONITORING, ED
Glucose-Capillary: 166 mg/dL — ABNORMAL HIGH (ref 70–99)
Glucose-Capillary: 199 mg/dL — ABNORMAL HIGH (ref 70–99)

## 2022-02-23 LAB — BRAIN NATRIURETIC PEPTIDE: B Natriuretic Peptide: 283.7 pg/mL — ABNORMAL HIGH (ref 0.0–100.0)

## 2022-02-23 MED ORDER — KETAMINE HCL 50 MG/5ML IJ SOSY
1.0000 mg/kg | PREFILLED_SYRINGE | Freq: Once | INTRAMUSCULAR | Status: AC
  Administered 2022-02-23: 150 mg via INTRAVENOUS

## 2022-02-23 MED ORDER — INSULIN ASPART 100 UNIT/ML IJ SOLN: 0.0000 [IU] | Freq: Three times a day (TID) | INTRAMUSCULAR | Status: DC

## 2022-02-23 MED ORDER — FENTANYL CITRATE PF 50 MCG/ML IJ SOSY
50.0000 ug | PREFILLED_SYRINGE | INTRAMUSCULAR | Status: AC | PRN
  Administered 2022-02-24 – 2022-02-25 (×3): 50 ug via INTRAVENOUS
  Filled 2022-02-23 (×2): qty 1

## 2022-02-23 MED ORDER — NOREPINEPHRINE 4 MG/250ML-% IV SOLN
2.0000 ug/min | INTRAVENOUS | Status: DC
  Filled 2022-02-23: qty 250

## 2022-02-23 MED ORDER — LACTATED RINGERS IV BOLUS
500.0000 mL | Freq: Once | INTRAVENOUS | Status: AC
  Administered 2022-02-24: 500 mL via INTRAVENOUS

## 2022-02-23 MED ORDER — FENTANYL CITRATE PF 50 MCG/ML IJ SOSY
PREFILLED_SYRINGE | INTRAMUSCULAR | Status: AC
  Administered 2022-02-23: 100 ug via INTRAVENOUS
  Filled 2022-02-23: qty 2

## 2022-02-23 MED ORDER — SUCCINYLCHOLINE CHLORIDE 200 MG/10ML IV SOSY
PREFILLED_SYRINGE | INTRAVENOUS | Status: AC
  Administered 2022-02-23: 200 mg via INTRAVENOUS
  Filled 2022-02-23: qty 10

## 2022-02-23 MED ORDER — PROPOFOL 1000 MG/100ML IV EMUL
INTRAVENOUS | Status: AC
  Filled 2022-02-23: qty 100

## 2022-02-23 MED ORDER — ACETAMINOPHEN 325 MG PO TABS
650.0000 mg | ORAL_TABLET | ORAL | Status: DC | PRN
Start: 2022-02-23 — End: 2022-04-12
  Administered 2022-03-20 – 2022-04-04 (×12): 650 mg via ORAL
  Filled 2022-02-23 (×12): qty 2

## 2022-02-23 MED ORDER — BUDESONIDE 0.25 MG/2ML IN SUSP
0.2500 mg | Freq: Two times a day (BID) | RESPIRATORY_TRACT | Status: DC
  Administered 2022-02-24 – 2022-03-27 (×62): 0.25 mg via RESPIRATORY_TRACT
  Filled 2022-02-23 (×63): qty 2

## 2022-02-23 MED ORDER — DOCUSATE SODIUM 100 MG PO CAPS: 100.0000 mg | ORAL_CAPSULE | Freq: Two times a day (BID) | ORAL | Status: DC | PRN

## 2022-02-23 MED ORDER — SODIUM CHLORIDE 0.9 % IV SOLN: 250.0000 mL | INTRAVENOUS | Status: DC

## 2022-02-23 MED ORDER — POLYETHYLENE GLYCOL 3350 17 G PO PACK
17.0000 g | PACK | Freq: Every day | ORAL | Status: DC
  Administered 2022-02-24 – 2022-03-09 (×13): 17 g
  Filled 2022-02-23 (×14): qty 1

## 2022-02-23 MED ORDER — SODIUM CHLORIDE 0.9 % IV SOLN: 250.0000 mL | INTRAVENOUS | Status: DC | PRN

## 2022-02-23 MED ORDER — MIDAZOLAM HCL 2 MG/2ML IJ SOLN
2.0000 mg | INTRAMUSCULAR | Status: DC | PRN
  Administered 2022-02-24 – 2022-02-25 (×2): 2 mg via INTRAVENOUS
  Filled 2022-02-23 (×4): qty 2

## 2022-02-23 MED ORDER — MIDAZOLAM HCL 2 MG/2ML IJ SOLN
2.0000 mg | INTRAMUSCULAR | Status: DC | PRN
  Administered 2022-02-23 – 2022-03-06 (×4): 2 mg via INTRAVENOUS
  Filled 2022-02-23 (×4): qty 2

## 2022-02-23 MED ORDER — INSULIN ASPART 100 UNIT/ML IJ SOLN: 0.0000 [IU] | Freq: Every day | INTRAMUSCULAR | Status: DC

## 2022-02-23 MED ORDER — SODIUM CHLORIDE 0.9 % IV SOLN
1.0000 g | INTRAVENOUS | Status: AC
  Administered 2022-02-24 – 2022-02-27 (×5): 1 g via INTRAVENOUS
  Filled 2022-02-23 (×5): qty 1

## 2022-02-23 MED ORDER — IPRATROPIUM-ALBUTEROL 0.5-2.5 (3) MG/3ML IN SOLN
3.0000 mL | Freq: Four times a day (QID) | RESPIRATORY_TRACT | Status: DC
Start: 2022-02-24 — End: 2022-03-16
  Administered 2022-02-24 – 2022-03-16 (×84): 3 mL via RESPIRATORY_TRACT
  Filled 2022-02-23 (×82): qty 3

## 2022-02-23 MED ORDER — IPRATROPIUM-ALBUTEROL 0.5-2.5 (3) MG/3ML IN SOLN
3.0000 mL | Freq: Once | RESPIRATORY_TRACT | Status: AC
  Administered 2022-02-23: 3 mL via RESPIRATORY_TRACT
  Filled 2022-02-23: qty 3

## 2022-02-23 MED ORDER — NICOTINE 21 MG/24HR TD PT24
21.0000 mg | MEDICATED_PATCH | Freq: Every day | TRANSDERMAL | Status: DC
Start: 2022-02-23 — End: 2022-03-06
  Administered 2022-02-23 – 2022-03-06 (×12): 21 mg via TRANSDERMAL
  Filled 2022-02-23 (×12): qty 1

## 2022-02-23 MED ORDER — IPRATROPIUM-ALBUTEROL 0.5-2.5 (3) MG/3ML IN SOLN
3.0000 mL | Freq: Four times a day (QID) | RESPIRATORY_TRACT | Status: DC | PRN
  Administered 2022-02-23: 3 mL via RESPIRATORY_TRACT
  Filled 2022-02-23: qty 3

## 2022-02-23 MED ORDER — ROCURONIUM BROMIDE 10 MG/ML (PF) SYRINGE
PREFILLED_SYRINGE | INTRAVENOUS | Status: AC
  Administered 2022-02-23: 100 mg
  Filled 2022-02-23: qty 10

## 2022-02-23 MED ORDER — ENOXAPARIN SODIUM 40 MG/0.4ML IJ SOSY: 40.0000 mg | PREFILLED_SYRINGE | INTRAMUSCULAR | Status: DC

## 2022-02-23 MED ORDER — FUROSEMIDE 10 MG/ML IJ SOLN
40.0000 mg | Freq: Two times a day (BID) | INTRAMUSCULAR | Status: DC
  Administered 2022-02-23: 40 mg via INTRAVENOUS
  Filled 2022-02-23: qty 4

## 2022-02-23 MED ORDER — DOCUSATE SODIUM 50 MG/5ML PO LIQD
100.0000 mg | Freq: Two times a day (BID) | ORAL | Status: DC
  Administered 2022-02-24 – 2022-03-15 (×29): 100 mg
  Filled 2022-02-23 (×29): qty 10

## 2022-02-23 MED ORDER — ONDANSETRON HCL 4 MG/2ML IJ SOLN: 4.0000 mg | Freq: Four times a day (QID) | INTRAMUSCULAR | Status: DC | PRN

## 2022-02-23 MED ORDER — IOHEXOL 350 MG/ML SOLN
100.0000 mL | Freq: Once | INTRAVENOUS | Status: AC | PRN
  Administered 2022-02-23: 100 mL via INTRAVENOUS

## 2022-02-23 MED ORDER — INSULIN ASPART 100 UNIT/ML IJ SOLN
0.0000 [IU] | INTRAMUSCULAR | Status: DC
  Administered 2022-02-24: 3 [IU] via SUBCUTANEOUS
  Administered 2022-02-24 (×2): 4 [IU] via SUBCUTANEOUS
  Administered 2022-02-24 (×2): 3 [IU] via SUBCUTANEOUS
  Administered 2022-02-24: 4 [IU] via SUBCUTANEOUS
  Administered 2022-02-24 – 2022-02-28 (×9): 3 [IU] via SUBCUTANEOUS
  Filled 2022-02-23 (×15): qty 1

## 2022-02-23 MED ORDER — FENTANYL CITRATE PF 50 MCG/ML IJ SOSY: 100.0000 ug | PREFILLED_SYRINGE | Freq: Once | INTRAMUSCULAR | Status: AC

## 2022-02-23 MED ORDER — SODIUM CHLORIDE 0.9 % IV SOLN
100.0000 mg | Freq: Two times a day (BID) | INTRAVENOUS | Status: AC
  Administered 2022-02-23 – 2022-02-27 (×9): 100 mg via INTRAVENOUS
  Filled 2022-02-23 (×9): qty 100

## 2022-02-23 MED ORDER — METHYLPREDNISOLONE SODIUM SUCC 125 MG IJ SOLR
125.0000 mg | Freq: Once | INTRAMUSCULAR | Status: AC
  Administered 2022-02-23: 125 mg via INTRAVENOUS
  Filled 2022-02-23: qty 2

## 2022-02-23 MED ORDER — IPRATROPIUM-ALBUTEROL 0.5-2.5 (3) MG/3ML IN SOLN
3.0000 mL | RESPIRATORY_TRACT | Status: DC | PRN
  Administered 2022-03-21 – 2022-03-22 (×2): 3 mL via RESPIRATORY_TRACT
  Filled 2022-02-23 (×3): qty 3

## 2022-02-23 MED ORDER — POLYETHYLENE GLYCOL 3350 17 G PO PACK: 17.0000 g | PACK | Freq: Every day | ORAL | Status: DC | PRN

## 2022-02-23 MED ORDER — FONDAPARINUX SODIUM 2.5 MG/0.5ML ~~LOC~~ SOLN
2.5000 mg | SUBCUTANEOUS | Status: DC
  Administered 2022-02-23 – 2022-04-11 (×46): 2.5 mg via SUBCUTANEOUS
  Filled 2022-02-23 (×50): qty 0.5

## 2022-02-23 MED ORDER — FENTANYL CITRATE PF 50 MCG/ML IJ SOSY
50.0000 ug | PREFILLED_SYRINGE | INTRAMUSCULAR | Status: DC | PRN
  Administered 2022-02-25 – 2022-02-26 (×3): 100 ug via INTRAVENOUS
  Filled 2022-02-23 (×2): qty 2
  Filled 2022-02-23: qty 1

## 2022-02-23 MED ORDER — NOREPINEPHRINE 4 MG/250ML-% IV SOLN
INTRAVENOUS | Status: AC
  Administered 2022-02-24: 2 ug/min via INTRAVENOUS
  Filled 2022-02-23: qty 250

## 2022-02-23 MED ORDER — PANTOPRAZOLE 2 MG/ML SUSPENSION
40.0000 mg | Freq: Every day | ORAL | Status: DC
  Administered 2022-02-24 – 2022-03-11 (×16): 40 mg
  Filled 2022-02-23 (×17): qty 20

## 2022-02-23 MED ORDER — KETAMINE HCL 10 MG/ML IJ SOLN
INTRAMUSCULAR | Status: AC
  Administered 2022-02-23: 150 mg
  Filled 2022-02-23: qty 1

## 2022-02-23 MED ORDER — PROPOFOL 1000 MG/100ML IV EMUL
5.0000 ug/kg/min | INTRAVENOUS | Status: DC
  Administered 2022-02-23: 50 ug/kg/min via INTRAVENOUS
  Administered 2022-02-24: 40 ug/kg/min via INTRAVENOUS
  Administered 2022-02-24 (×2): 50 ug/kg/min via INTRAVENOUS
  Administered 2022-02-24: 40 ug/kg/min via INTRAVENOUS
  Administered 2022-02-24: 42 ug/kg/min via INTRAVENOUS
  Administered 2022-02-24: 35 ug/kg/min via INTRAVENOUS
  Administered 2022-02-24 (×3): 50 ug/kg/min via INTRAVENOUS
  Administered 2022-02-24 – 2022-02-25 (×2): 45 ug/kg/min via INTRAVENOUS
  Administered 2022-02-25 (×4): 50 ug/kg/min via INTRAVENOUS
  Administered 2022-02-25: 35 ug/kg/min via INTRAVENOUS
  Administered 2022-02-25: 50 ug/kg/min via INTRAVENOUS
  Administered 2022-02-25 – 2022-02-26 (×9): 40 ug/kg/min via INTRAVENOUS
  Administered 2022-02-26: 45 ug/kg/min via INTRAVENOUS
  Administered 2022-02-26: 50 ug/kg/min via INTRAVENOUS
  Administered 2022-02-27 (×6): 30 ug/kg/min via INTRAVENOUS
  Administered 2022-02-27: 40 ug/kg/min via INTRAVENOUS
  Administered 2022-02-28 – 2022-03-01 (×9): 30 ug/kg/min via INTRAVENOUS
  Administered 2022-03-01: 35 ug/kg/min via INTRAVENOUS
  Administered 2022-03-01 – 2022-03-04 (×16): 30 ug/kg/min via INTRAVENOUS
  Filled 2022-02-23 (×65): qty 100

## 2022-02-23 MED ORDER — STERILE WATER FOR INJECTION IJ SOLN
INTRAMUSCULAR | Status: AC
  Filled 2022-02-23: qty 10

## 2022-02-23 MED ORDER — SODIUM CHLORIDE 0.9% FLUSH
3.0000 mL | Freq: Two times a day (BID) | INTRAVENOUS | Status: DC
  Administered 2022-02-24 – 2022-03-07 (×22): 3 mL via INTRAVENOUS

## 2022-02-23 MED ORDER — SUCCINYLCHOLINE CHLORIDE 200 MG/10ML IV SOSY: 200.0000 mg | PREFILLED_SYRINGE | Freq: Once | INTRAVENOUS | Status: AC

## 2022-02-23 MED ORDER — SODIUM CHLORIDE 0.9% FLUSH
3.0000 mL | INTRAVENOUS | Status: DC | PRN
  Administered 2022-03-01: 3 mL via INTRAVENOUS

## 2022-02-23 MED ORDER — LORAZEPAM 2 MG/ML IJ SOLN
1.0000 mg | Freq: Once | INTRAMUSCULAR | Status: DC
  Filled 2022-02-23: qty 1

## 2022-02-23 NOTE — ED Notes (Signed)
Patient transported to CT 

## 2022-02-23 NOTE — ED Provider Notes (Addendum)
[facility] Provider Note    Event Date/Time   First MD Initiated Contact with Patient 02/23/22 1640     (approximate)   History   Shortness of Breath and Fatigue   HPI  Bryan Mitchell is a 61 y.o. male Bryan Mitchell to the ER for evaluation of shortness of breath and weakness for the past few days.  Family member bedside states he has been a little bit more confused lately.  Was previously on oxygen but does not wear it.  Has a history of COPD as well as diastolic dysfunction.  States has been having productive cough.  No measured fevers or chills.  In triage was found to be hypoxic to the 60s.     Physical Exam   Triage Vital Signs: ED Triage Vitals  Enc Vitals Group     BP 02/23/22 1600 117/68     Pulse Rate 02/23/22 1600 100     Resp 02/23/22 1600 20     Temp 02/23/22 1607 97.9 F (36.6 C)     Temp Source 02/23/22 1607 Oral     SpO2 02/23/22 1600 (!) 62 %     Weight 02/23/22 1604 (!) 320 lb (145.2 kg)     Height 02/23/22 1604 5\' 8"  (1.727 m)     Head Circumference --      Peak Flow --      Pain Score 02/23/22 1602 4     Pain Loc --      Pain Edu? --      Excl. in GC? --     Most recent vital signs: Vitals:   02/23/22 1930 02/23/22 1947  BP:    Pulse: (!) 36   Resp: (!) 29   Temp:  97.9 F (36.6 C)  SpO2: 100%      Constitutional: Alert  Eyes: Conjunctivae are normal.  Head: Atraumatic. Nose: No congestion/rhinnorhea. Mouth/Throat: Mucous membranes are moist.   Neck: Painless ROM.  Cardiovascular:   Good peripheral circulation.  No murmurs gallops or rubs. Respiratory: Mild tachypnea with diminished breath sounds. Gastrointestinal: Soft and nontender.  Musculoskeletal:  no deformity, 2+ bilateral lower extremity edema. Neurologic:  MAE spontaneously. No gross focal neurologic deficits are appreciated.  Skin:  Skin is warm, dry and intact. No rash noted. Psychiatric: Mood and affect are normal. Speech and behavior are  normal.    ED Results / Procedures / Treatments   Labs (all labs ordered are listed, but only abnormal results are displayed) Labs Reviewed  D-DIMER, QUANTITATIVE - Abnormal; Notable for the following components:      Result Value   D-Dimer, Quant 0.74 (*)    All other components within normal limits  BLOOD GAS, VENOUS - Abnormal; Notable for the following components:   pCO2, Ven 99 (*)    pO2, Ven 59 (*)    Bicarbonate 43.4 (*)    Acid-Base Excess 11.8 (*)    All other components within normal limits  BRAIN NATRIURETIC PEPTIDE - Abnormal; Notable for the following components:   B Natriuretic Peptide 283.7 (*)    All other components within normal limits  CBC - Abnormal; Notable for the following components:   RBC 6.67 (*)    Hemoglobin 18.9 (*)    HCT 63.3 (*)    MCHC 29.9 (*)    RDW 17.7 (*)    nRBC 1.5 (*)    All other components within normal limits  BASIC METABOLIC PANEL - Abnormal; Notable for the following components:  Glucose, Bld 142 (*)    BUN 31 (*)    Calcium 8.3 (*)    All other components within normal limits  RESP PANEL BY RT-PCR (FLU A&B, COVID) ARPGX2  HIV ANTIBODY (ROUTINE TESTING W REFLEX)  BASIC METABOLIC PANEL  HEMOGLOBIN A1C     EKG  ED ECG REPORT I, Willy Eddy, the attending physician, personally viewed and interpreted this ECG.   Date: 02/23/2022  EKG Time: 16:27  Rate: 95  Rhythm: sinus  Axis: normal  Intervals: normal qt  ST&T Change: no stemi, no depressions    RADIOLOGY Please see ED Course for my review and interpretation.  I personally reviewed all radiographic images ordered to evaluate for the above acute complaints and reviewed radiology reports and findings.  These findings were personally discussed with the patient.  Please see medical record for radiology report.    PROCEDURES:  Critical Care performed: Yes, see critical care procedure note(s)  .Critical Care  Performed by: Willy Eddy,  MD Authorized by: Willy Eddy, MD   Critical care provider statement:    Critical care time (minutes):  40   Critical care was necessary to treat or prevent imminent or life-threatening deterioration of the following conditions:  Respiratory failure   Critical care was time spent personally by me on the following activities:  Ordering and performing treatments and interventions, ordering and review of laboratory studies, ordering and review of radiographic studies, pulse oximetry, re-evaluation of patient's condition, review of old charts, obtaining history from patient or surrogate, examination of patient, evaluation of patient's response to treatment, discussions with primary provider, discussions with consultants and development of treatment plan with patient or surrogate Procedure Name: Intubation Date/Time: 02/23/2022 9:39 PM  Performed by: Willy Eddy, MDPre-anesthesia Checklist: Patient identified, Emergency Drugs available, Suction available and Patient being monitored Preoxygenation: Pre-oxygenation with 100% oxygen Induction Type: IV induction and Rapid sequence Laryngoscope Size: Glidescope and 3 Grade View: Grade I Tube size: 7.5 mm Number of attempts: 1 Airway Equipment and Method: Video-laryngoscopy Placement Confirmation: ETT inserted through vocal cords under direct vision, CO2 detector and Breath sounds checked- equal and bilateral Secured at: 23 cm Tube secured with: ETT holder Dental Injury: Teeth and Oropharynx as per pre-operative assessment        MEDICATIONS ORDERED IN ED: Medications  sterile water (preservative free) injection (  Not Given 02/23/22 1955)  sodium chloride flush (NS) 0.9 % injection 3 mL (3 mLs Intravenous Not Given 02/23/22 2109)  sodium chloride flush (NS) 0.9 % injection 3 mL (has no administration in time range)  0.9 %  sodium chloride infusion (has no administration in time range)  acetaminophen (TYLENOL) tablet 650 mg (has no  administration in time range)  ondansetron (ZOFRAN) injection 4 mg (has no administration in time range)  furosemide (LASIX) injection 40 mg (40 mg Intravenous Given 02/23/22 1957)  ipratropium-albuterol (DUONEB) 0.5-2.5 (3) MG/3ML nebulizer solution 3 mL (has no administration in time range)  doxycycline (VIBRAMYCIN) 100 mg in sodium chloride 0.9 % 250 mL IVPB (100 mg Intravenous New Bag/Given 02/23/22 1959)  fondaparinux (ARIXTRA) injection 2.5 mg (2.5 mg Subcutaneous Given 02/23/22 2107)  insulin aspart (novoLOG) injection 0-20 Units (has no administration in time range)  insulin aspart (novoLOG) injection 0-5 Units (has no administration in time range)  nicotine (NICODERM CQ - dosed in mg/24 hours) patch 21 mg (21 mg Transdermal Patch Applied 02/23/22 2106)  LORazepam (ATIVAN) injection 1 mg (has no administration in time range)  ketamine (KETALAR) 10 MG/ML  injection (has no administration in time range)  propofol (DIPRIVAN) 1000 MG/100ML infusion (50 mcg/kg/min  145.2 kg Intravenous New Bag/Given 02/23/22 2133)  propofol (DIPRIVAN) 1000 MG/100ML infusion (has no administration in time range)  ipratropium-albuterol (DUONEB) 0.5-2.5 (3) MG/3ML nebulizer solution 3 mL (3 mLs Nebulization Given 02/23/22 1739)  ipratropium-albuterol (DUONEB) 0.5-2.5 (3) MG/3ML nebulizer solution 3 mL (3 mLs Nebulization Given 02/23/22 1739)  methylPREDNISolone sodium succinate (SOLU-MEDROL) 125 mg/2 mL injection 125 mg (125 mg Intravenous Given 02/23/22 1831)  ipratropium-albuterol (DUONEB) 0.5-2.5 (3) MG/3ML nebulizer solution 3 mL (3 mLs Nebulization Given 02/23/22 1832)  iohexol (OMNIPAQUE) 350 MG/ML injection 100 mL (100 mLs Intravenous Contrast Given 02/23/22 1818)  ketamine 50 mg in normal saline 5 mL (10 mg/mL) syringe (150 mg Intravenous Given 02/23/22 2129)  succinylcholine (ANECTINE) syringe 200 mg (200 mg Intravenous Given 02/23/22 2129)     IMPRESSION / MDM / ASSESSMENT AND PLAN / ED COURSE  I reviewed the  triage vital signs and the nursing notes.                              Differential diagnosis includes, but is not limited to, Asthma, copd, CHF, pna, ptx, malignancy, Pe, anemia  Patient presenting with symptoms as described above. This presenting complaint could reflect a potentially life-threatening illness therefore the patient will be placed on continuous pulse oximetry and telemetry for monitoring.  Laboratory evaluation will be sent to evaluate for the above complaints.   I ordered for above differential.  He is satting mid 90s on nasal cannula.  We will give nebulizer.  Clinical Course as of 02/23/22 2139  Sat Feb 23, 2022  1749 Chest x-ray on my review and interpretation does show moderate vascular congestion. [PR]  1757 No significant hypercapnic respiratory failure it is compensated however.  Will place on BiPAP. [PR]  2137 Patient became acutely agitated ripping off BiPAP with significant hypoxia became mottled blue less responsive fell back onto bed.  Patient intubated for airway protection and respiratory support.  Discussed case with intensivist who agrees admit patient. [PR]    Clinical Course User Index [PR] Merlyn Lot, MD      Clinical Course User Index [PR] Merlyn Lot, MD    FINAL CLINICAL IMPRESSION(S) / ED DIAGNOSES   Final diagnoses:  Acute respiratory failure with hypoxia and hypercapnia ([place])     Rx / DC Orders   ED Discharge Orders     None        Note:  This document was prepared using Dragon voice recognition software and may include unintentional dictation errors.       Merlyn Lot, MD 02/23/22 2139

## 2022-02-23 NOTE — ED Notes (Signed)
Warm blankets applied

## 2022-02-23 NOTE — Consult Note (Signed)
NAME:  Bryan Mitchell, MRN:  [id number], DOB:  01/27/1961, LOS: 0 ADMISSION DATE:  02/23/2022, CONSULTATION DATE:  02/23/22 REFERRING MD:  Dr. Toy Cookey, CHIEF COMPLAINT:  SOB and fatigue   History of Present Illness:  61 yo M presenting to [facility] ED on 02/23/22 from home in a wheelchair with his sister for evaluation of worsening fatigue, poor appetite and SOB over the past 2 weeks. Per ED documentation, patient also complained of worsening bilateral leg swelling and pain.  Sister interviewed over the telephone providing supplemental history. She described that he lives alone, and began not feeling good for about 2 weeks with complaints of dyspnea and fatigue. He self-treated with Mucinex and took a COVID test which was negative. He started having increased congestion and poor PO intake. On the night of 8/18 the sister spoke with the patient on the phone and he told her, "I feel like I'm dying". Today on 8/19 she convinced the patient to go to the hospital to be evaluated. She reports he does not trust doctors and has difficulty following outpatient medication regimens. He was prescribed home O2, and has oxygen tanks at the house but has not used them in 2 years per his sister. He also injured his RIGHT knee a few weeks ago and had been getting around with a crutch. She reported he had been a little more confused on 8/19.  Per ED documentation, he endorsed a productive cough, but denied fever/chills. Patient admitted to not taking his Lasix when interviewed by [facility] service, per their documentation. And he has noticed increased pedal edema recently.  ED course: Upon arrival in triage the patient was profoundly hypoxic with his SpO2 was in the 60's on RA, this initially recovered to 99% on 3 L [place]. ABG revealed partially compensated hypercapnia and he was placed on BIPAP. He received Lasix for diuresis, BNP was mildly elevated. CT angio negative for pulmonary edema/effusions, negative for PE. [facility] service  consulted for admission. After a couple of hours on BIPAP, patient became combative and agitated, ripping off the BIPAP mask. Patient emergently intubated in ED. PCCM consulted. Medications given: IV contrast, Duo-nebs, lasix, fondaparinux, doxycycline, fentanyl, versed, ketamine, rocuronium & succinylcholine for intubation, solumedrol, propofol drip started Initial Vitals: 97.9, 20, 100, 117/68 & SpO2 62% on RA Significant labs: (Labs/ Imaging personally reviewed) I, Domingo Pulse Rust-Chester, AGACNP-BC, personally viewed and interpreted this ECG. EKG Interpretation: Date: 02/23/22, EKG Time: 16:27, Rate: 98, Rhythm: NSR, QRS Axis:  borderline RAD, Intervals: normal, ST/T Wave abnormalities: inferior T wave inversions, Narrative Interpretation: NSR with borderline RAD and probable LAE with inferior T wave inversions Chemistry: Na+: 139, K+: 3.8, BUN/Cr.: 31/0.79, Serum CO2/ AG: 32/8 Hematology: WBC: 6.9, Hgb: 18.9, Hct: 63.3, plt: 178 BNP: 283.7, PCT: 0.22, COVID-19 & Influenza A/B: negative ABG: 7.25/ 99/ 59/ 43.4 CXR 02/23/22: pulmonary hypoinflation. Central pulmonary vascular engorgement without overt pulmonary edema. Stable tubes CT 02/23/22: no evidence of pulmonary embolism. Mild cardiomegaly, CAD, main pulmonary trunk is dilated measuring 3.9 cm suggesting pulmonary arterial hypertension. Mild bibasilar atelectasis. No evidence of pulmonary edema or pleural effusion. CT head wo contrast 02/23/22: pending  PCCM consulted due to worsening acute on chronic hypoxic/hypercapnic respiratory failure requiring emergent intubation and mechanical ventilatory support.  Pertinent  Medical History  T2DM COPD Morbid obesity Tobacco abuse (cigars, 40 pack year history) Polycythemia HFpEF  Significant Hospital Events: Including procedures, antibiotic start and stop dates in addition to other pertinent events   02/23/22: Admit to ICU with worsening acute  on chronic hypoxic/hypercapnic respiratory  failure s/t suspected COPD exacerbation in the setting of HFpEF and possible pulmonary hypertension requiring emergent intubation and mechanical ventilatory support.  Interim History / Subjective:  Patient intubated and sedated with stable vitals  Objective   Blood pressure (!) 142/73, pulse (!) 36, temperature 97.9 F (36.6 C), temperature source Axillary, resp. rate (!) 29, height '5\' 8"'  (1.727 m), weight (!) 145.2 kg, SpO2 100 %.       No intake or output data in the 24 hours ending 02/23/22 2148 Filed Weights   02/23/22 1604  Weight: (!) 145.2 kg    Examination: General: Adult male, critically ill, lying in bed intubated & sedated requiring mechanical ventilation, NAD HEENT: MM pink/moist, anicteric, sclera red, atraumatic, neck supple Neuro: RASS: -3, unable to follow commands, PERRL +3 , MAE CV: s1s2 RRR, NSR on monitor, no r/m/g Pulm: Regular, non labored on PRVC 100% & PEEP 8, breath sounds expiratory wheezes/ diminished-BUL & diminished-BLL GI: soft, rounded, bs x 4 GU: foley in place with clear yellow urine Skin: scattered papules on chest, axillae and groin- possible heat rash Extremities: warm/dry, pulses + 2 R/P, trace edema vs body habitus noted BLE  Resolved Hospital Problem list     Assessment & Plan:  Acute on Chronic Hypoxic / Hypercapnic Respiratory Failure suspect multifocal in the setting of suspected COPD exacerbation & decompensated HFpEF  Tobacco Abuse PMHx: COPD, morbid obesity, tobacco abuse, HFpEF - Ventilator settings: PRVC  8 mL/kg, 100% FiO2, 8 PEEP, continue ventilator support & lung protective strategies - Wean PEEP & FiO2 as tolerated, maintain SpO2 > 90% - Head of bed elevated 30 degrees, VAP protocol in place - Plateau pressures less than 30 cm H20  - Intermittent chest x-ray & ABG PRN - Daily WUA with SBT as tolerated  - Ensure adequate pulmonary hygiene  - F/u cultures, trend PCT - CAP coverage ceftriaxone & doxycycline - Budesonide  nebulizer BID, Duo Nebs Q 6 h, bronchodilators PRN - PAD protocol in place: continue Fentanyl IVP & Propofol drip - continue nicotine patch, smoking cessation counseling once stabilized  Acute on Chronic HFpEF exacerbation Query Pulmonary HTN? PMHx: HFpEF  - Echocardiogram ordered - f/u BNP, lipid panel, troponin - Continuous cardiac monitoring  - Strict I/O's: alert provider if UOP < 0.5 mL/kg/hr - Daily BMP, replace electrolytes PRN - Daily weights to assess volume status - Diurese with the use of IV lasix as hemodynamics and renal function allow - Consider Cardiology consult depending on Echo and lab work above  Polycythemia suspected secondary to chronic hypoxia Followed by oncology outpatient, last seen 10/2019. He was offered a sleep study which he declined. At that time, he was deemed not a candidate for continued follow up as Hgb had stabilized at 17.0 & HCT was 51%.  Hgb: 18.9, Hct: 63.3. - daily CBC - consult oncology for consideration of therapeutic phlebotomy if AM Hct > 55  Type 2 Diabetes Mellitus Hemoglobin A1C: pending - Monitor CBG Q 4 hours - SSI resistant dosing - target range while in ICU: 140-180 - follow ICU hyper/hypo-glycemia protocol  Best Practice (right click and "Reselect all SmartList Selections" daily)  Diet/type: NPO w/ meds via tube DVT prophylaxis: Arixtra GI prophylaxis: PPI Lines: N/A Foley:  Yes, and it is still needed Code Status:  full code Last date of multidisciplinary goals of care discussion [02/23/22]  Labs   CBC: Recent Labs  Lab 02/23/22 1741  WBC 6.9  HGB 18.9*  HCT 63.3*  MCV 94.9  PLT 031    Basic Metabolic Panel: Recent Labs  Lab 02/23/22 1741  NA 139  K 3.8  CL 99  CO2 32  GLUCOSE 142*  BUN 31*  CREATININE 0.79  CALCIUM 8.3*   GFR: Estimated Creatinine Clearance: 139.4 mL/min (by C-G formula based on SCr of 0.79 mg/dL). Recent Labs  Lab 02/23/22 1741  WBC 6.9    Liver Function Tests: No results for  input(s): "AST", "ALT", "ALKPHOS", "BILITOT", "PROT", "ALBUMIN" in the last 168 hours. No results for input(s): "LIPASE", "AMYLASE" in the last 168 hours. No results for input(s): "AMMONIA" in the last 168 hours.  ABG    Component Value Date/Time   PHART 7.302 (L) 01/14/2018 0902   PCO2ART 84.5 (HH) 01/14/2018 0902   PO2ART 71.0 (L) 01/14/2018 0902   HCO3 43.4 (H) 02/23/2022 1741   TCO2 39 (H) 01/13/2018 0006   O2SAT 85.5 02/23/2022 1741     Coagulation Profile: No results for input(s): "INR", "PROTIME" in the last 168 hours.  Cardiac Enzymes: No results for input(s): "CKTOTAL", "CKMB", "CKMBINDEX", "TROPONINI" in the last 168 hours.  HbA1C: Hemoglobin A1C  Date/Time Value Ref Range Status  02/13/2018 01:52 PM 7.5 (A) 4.0 - 5.6 % Final   Hgb A1c MFr Bld  Date/Time Value Ref Range Status  01/13/2018 09:53 AM 7.3 (H) 4.8 - 5.6 % Final    Comment:    (NOTE) Pre diabetes:          5.7%-6.4% Diabetes:              >6.4% Glycemic control for   <7.0% adults with diabetes   12/16/2016 06:00 AM 6.2 (H) 4.8 - 5.6 % Final    Comment:    (NOTE)         Pre-diabetes: 5.7 - 6.4         Diabetes: >6.4         Glycemic control for adults with diabetes: <7.0     CBG: No results for input(s): "GLUCAP" in the last 168 hours.  Review of Systems:   UTA-patient intubated and sedated  Past Medical History:  He,  has a past medical history of Allergic reaction (12/15/2016), Hyperglycemia (12/2016), and Hypertension.   Surgical History:   Past Surgical History:  Procedure Laterality Date   KNEE SURGERY Right      Social History:   reports that he has been smoking cigars. He has never used smokeless tobacco. He reports current alcohol use. He reports that he does not use drugs.   Family History:  His family history includes CAD in his brother and father.   Allergies Allergies  Allergen Reactions   Other Anaphylaxis    McCormick's Rub (Food)   Beef-Derived Products  Itching    Alpha gal allergy   Pork-Derived Products Hives    Alpha gal     Home Medications  Prior to Admission medications   Medication Sig Start Date End Date Taking? Authorizing Provider  albuterol (PROVENTIL HFA;VENTOLIN HFA) 108 (90 Base) MCG/ACT inhaler Inhale 2 puffs into the lungs every 6 (six) hours as needed for wheezing or shortness of breath. 02/03/18   Azzie Glatter, FNP  aspirin 81 MG tablet Take 81 mg by mouth daily.    [provider]  blood glucose meter kit and supplies Dispense based on patient and insurance preference. Use up to four times daily as directed. (FOR ICD-10 E10.9, E11.9). 01/15/18   Eugenie Filler, MD  buPROPion [facility] SR)  150 MG 12 hr tablet Take 150 mg by mouth 2 (two) times daily.    [provider]  diphenhydrAMINE (BENADRYL) 25 MG tablet Take 1 tablet (25 mg total) by mouth every 6 (six) hours. Patient taking differently: Take 25 mg by mouth daily.  09/07/17   Jola Schmidt, MD  fluticasone (FLONASE) 50 MCG/ACT nasal spray Place 2 sprays into both nostrils daily. 01/16/18   Eugenie Filler, MD  folic acid (FOLVITE) 1 MG tablet Take 1 tablet (1 mg total) by mouth daily. Patient not taking: Reported on 02/03/2018 01/16/18   Eugenie Filler, MD  furosemide (LASIX) 40 MG tablet Take 1 tablet (40 mg total) by mouth daily. 02/03/18   Azzie Glatter, FNP  meloxicam (MOBIC) 15 MG tablet Take 15 mg by mouth daily.    [provider]  phentermine 15 MG capsule Take 15 mg by mouth every morning.    [provider]  traMADol (ULTRAM) 50 MG tablet Take 1 tablet (50 mg total) by mouth every 8 (eight) hours as needed. 02/13/18   Azzie Glatter, FNP  umeclidinium bromide (INCRUSE ELLIPTA) 62.5 MCG/INH AEPB Inhale 1 puff into the lungs daily. Patient not taking: Reported on 07/28/2019 02/11/18   Azzie Glatter, FNP     Critical care time: 65 minutes       Domingo Pulse Rust-[place], AGACNP-BC Acute Care Nurse  Practitioner [facility]   [phone] / [phone] Please see Amion for pager details.

## 2022-02-23 NOTE — ED Triage Notes (Signed)
Patient arrives in wheelchair with sister complaining of not feeling well over the past month. Reports fatigue, shortness of breath and decreased appetite. Patient intially 62% on RA- states he might have been prescribed oxygen in the past but doesn't have any. Patient placed on 3L [place] and sats up to 99%. Patient c/o bilateral leg swelling and pain

## 2022-02-23 NOTE — H&P (Signed)
History and Physical    Patient: Bryan Mitchell QDI:[id number] DOB: 07-10-1960 DOA: 02/23/2022 DOS: the patient was seen and examined on 02/23/2022 PCP: Pcp, No  Patient coming from: Home  Chief Complaint:  Chief Complaint  Patient presents with   Shortness of Breath   Fatigue   HPI: Bryan Mitchell is a 61 y.o. male with medical history significant of diastolic dysfunction CHF, COPD, morbid obesity, type 2 diabetes, tobacco abuse who presented to the ER with shortness of breath.  Patient is currently on BiPAP unable to give me adequate history.  But history obtained from records showed that his symptoms started about a week ago.  Progressively getting short of breath.  He has also noted worsening pedal edema.  Patient was supposed to be on 40 mg of Lasix daily which he has not been taking.  In the ER he was found to be hypoxic respiratory distress.  He has been placed on BiPAP at the moment and being admitted with acute hypoxic respiratory failure.  He was not on home O2 prior to coming in.  Chest x-ray indicated mild pulmonary edema.  No pneumonia.  COVID-19 respiratory screen alcohol negative.  CT angiogram of the chest showed no PE.  Patient will therefore be admitted to the medical service for further evaluation and treatment.  Review of Systems: As mentioned in the history of present illness. All other systems reviewed and are negative. Past Medical History:  Diagnosis Date   Allergic reaction 12/15/2016   Hyperglycemia 12/2016   Hypertension    Past Surgical History:  Procedure Laterality Date   KNEE SURGERY Right    Social History:  reports that he has been smoking cigars. He has never used smokeless tobacco. He reports current alcohol use. He reports that he does not use drugs.  Allergies  Allergen Reactions   Other Anaphylaxis    McCormick's Rub (Food)   Beef-Derived Products Itching    Alpha gal allergy   Pork-Derived Doctor, general practice gal    Family History   Problem Relation Age of Onset   CAD Father    CAD Brother     Prior to Admission medications   Medication Sig Start Date End Date Taking? Authorizing Provider  albuterol (PROVENTIL HFA;VENTOLIN HFA) 108 (90 Base) MCG/ACT inhaler Inhale 2 puffs into the lungs every 6 (six) hours as needed for wheezing or shortness of breath. 02/03/18   Azzie Glatter, FNP  aspirin 81 MG tablet Take 81 mg by mouth daily.    [provider]  blood glucose meter kit and supplies Dispense based on patient and insurance preference. Use up to four times daily as directed. (FOR ICD-10 E10.9, E11.9). 01/15/18   Eugenie Filler, MD  buPROPion (WELLBUTRIN SR) 150 MG 12 hr tablet Take 150 mg by mouth 2 (two) times daily.    [provider]  diphenhydrAMINE (BENADRYL) 25 MG tablet Take 1 tablet (25 mg total) by mouth every 6 (six) hours. Patient taking differently: Take 25 mg by mouth daily.  09/07/17   Jola Schmidt, MD  fluticasone (FLONASE) 50 MCG/ACT nasal spray Place 2 sprays into both nostrils daily. 01/16/18   Eugenie Filler, MD  folic acid (FOLVITE) 1 MG tablet Take 1 tablet (1 mg total) by mouth daily. Patient not taking: Reported on 02/03/2018 01/16/18   Eugenie Filler, MD  furosemide (LASIX) 40 MG tablet Take 1 tablet (40 mg total) by mouth daily. 02/03/18   Azzie Glatter, FNP  meloxicam (MOBIC) 15 MG tablet Take 15 mg by mouth daily.    [provider]  phentermine 15 MG capsule Take 15 mg by mouth every morning.    [provider]  traMADol (ULTRAM) 50 MG tablet Take 1 tablet (50 mg total) by mouth every 8 (eight) hours as needed. 02/13/18   Azzie Glatter, FNP  umeclidinium bromide (INCRUSE ELLIPTA) 62.5 MCG/INH AEPB Inhale 1 puff into the lungs daily. Patient not taking: Reported on 07/28/2019 02/11/18   Azzie Glatter, FNP    Physical Exam: Vitals:   02/23/22 1607 02/23/22 1630 02/23/22 1730 02/23/22 1828  BP:  138/73 126/74   Pulse:  99 96   Resp:  (!)  32 18 20  Temp: 97.9 F (36.6 C)     TempSrc: Oral     SpO2: 90% 91% 92% 95%  Weight:      Height:       Generally: Morbidly obese, in obvious respiratory distress HEENT: Injected conjunctiva, PERRLA EOMI Neck: Supple, mild JVD, no significant lymphadenopathy Respiratory: Decreased air entry bilaterally with marked crackles, rales but no significant wheezing Cardiovascular: Sinus tachycardia, 2+ pedal edema Abdomen: Distended, tympanitic, no organomegaly, positive bowel sounds Extremities: 2+ BLE edema, no cyanosis clubbing Skin: Hyperemic, multiple rashes visible. Neuro: On BiPAP, arousable, slightly drowsy Psych: Awake alert oriented, currently on BiPAP  Data Reviewed:  Afebrile, original sat 62% on room air VBG showed a pH of 7.25 with PCO2 of 99.  Creatinine 0.79.  BNP 283 hemoglobin 18.9 respiratory skin negative chest x-ray showed bilateral vascular congestion along with mild interstitial prominence.  CT angiogram of the chest showed no PE but mild cardiomegaly.  Pulmonary arterial hypertension  Assessment and Plan:  #1 acute respiratory failure with hypoxia and hypercarbia: Most likely due to CHF exacerbation.  Cannot rule out COPD exacerbation on top of that.  In the morning however we will admit the patient to progressive care.  Patient on BiPAP at the moment.  The goal is to diurese aggressively.  He was supposed to be on diuretics but not taking it.  We will monitor until he is titrated off of BiPAP.  Echocardiogram will be recommended.  Cardiology consult may be indicated if echo confirms CHF.  #2 diabetes: Sliding scale insulin with diabetic diet  #3 essential hypertension: Continue diuretics and other home regimen  #4 morbid obesity: Dietary counseling  #5 tobacco abuse: Initiate nicotine patch.  Tobacco cessation counseling when ready    Advance Care Planning:   Code Status: Prior full  Consults: But may need pulmonary or cardiology consult  Family  Communication: No family at bedside  Severity of Illness: The appropriate patient status for this patient is INPATIENT. Inpatient status is judged to be reasonable and necessary in order to provide the required intensity of service to ensure the patient's safety. The patient's presenting symptoms, physical exam findings, and initial radiographic and laboratory data in the context of their chronic comorbidities is felt to place them at high risk for further clinical deterioration. Furthermore, it is not anticipated that the patient will be medically stable for discharge from the hospital within 2 midnights of admission.   * I certify that at the point of admission it is my clinical judgment that the patient will require inpatient hospital care spanning beyond 2 midnights from the point of admission due to high intensity of service, high risk for further deterioration and high frequency of surveillance required.*  AuthorBarbette Merino, MD 02/23/2022 6:57 PM  For on call review [email or web address].

## 2022-02-24 ENCOUNTER — Inpatient Hospital Stay (HOSPITAL_COMMUNITY)
Admit: 2022-02-24 | Discharge: 2022-02-24 | Disposition: A | Discharge status: Home / Self Care | Payer: Medicaid Other | Attending: Internal Medicine | Admitting: Internal Medicine

## 2022-02-24 DIAGNOSIS — I5031 Acute diastolic (congestive) heart failure: Secondary | ICD-10-CM

## 2022-02-24 LAB — ECHOCARDIOGRAM COMPLETE
AR max vel: 1.59 cm2
AV Peak grad: 16 mmHg
Ao pk vel: 2 m/s
Area-P 1/2: 3.65 cm2
Height: 68 in
S' Lateral: 2.84 cm
Weight: 5114.67 oz

## 2022-02-24 LAB — CBC WITH DIFFERENTIAL/PLATELET
Abs Immature Granulocytes: 0.11 10*3/uL — ABNORMAL HIGH (ref 0.00–0.07)
Basophils Absolute: 0 10*3/uL (ref 0.0–0.1)
Basophils Relative: 0 %
Eosinophils Absolute: 0 10*3/uL (ref 0.0–0.5)
Eosinophils Relative: 0 %
HCT: 61.8 % — ABNORMAL HIGH (ref 39.0–52.0)
Hemoglobin: 18.6 g/dL — ABNORMAL HIGH (ref 13.0–17.0)
Immature Granulocytes: 1 %
Lymphocytes Relative: 7 %
Lymphs Abs: 0.6 10*3/uL — ABNORMAL LOW (ref 0.7–4.0)
MCH: 28.4 pg (ref 26.0–34.0)
MCHC: 30.1 g/dL (ref 30.0–36.0)
MCV: 94.5 fL (ref 80.0–100.0)
Monocytes Absolute: 0.6 10*3/uL (ref 0.1–1.0)
Monocytes Relative: 6 %
Neutro Abs: 7.9 10*3/uL — ABNORMAL HIGH (ref 1.7–7.7)
Neutrophils Relative %: 86 %
Platelets: 186 10*3/uL (ref 150–400)
RBC: 6.54 MIL/uL — ABNORMAL HIGH (ref 4.22–5.81)
RDW: 18 % — ABNORMAL HIGH (ref 11.5–15.5)
WBC: 9.3 10*3/uL (ref 4.0–10.5)
nRBC: 0.4 % — ABNORMAL HIGH (ref 0.0–0.2)

## 2022-02-24 LAB — BASIC METABOLIC PANEL
Anion gap: 14 (ref 5–15)
BUN: 28 mg/dL — ABNORMAL HIGH (ref 6–20)
CO2: 26 mmol/L (ref 22–32)
Calcium: 8.6 mg/dL — ABNORMAL LOW (ref 8.9–10.3)
Chloride: 102 mmol/L (ref 98–111)
Creatinine, Ser: 0.84 mg/dL (ref 0.61–1.24)
GFR, Estimated: >60 mL/min (ref >60)
Glucose, Bld: 160 mg/dL — ABNORMAL HIGH (ref 70–99)
Potassium: 4.3 mmol/L (ref 3.5–5.1)
Sodium: 142 mmol/L (ref 135–145)

## 2022-02-24 LAB — RESPIRATORY PANEL BY PCR

## 2022-02-24 LAB — LIPID PANEL
Cholesterol: 166 mg/dL (ref 0–200)
HDL: 20 mg/dL — ABNORMAL LOW (ref >40)
LDL Cholesterol: 120 mg/dL — ABNORMAL HIGH (ref 0–99)
Total CHOL/HDL Ratio: 8.3 RATIO
Triglycerides: 130 mg/dL (ref <150)
VLDL: 26 mg/dL (ref 0–40)

## 2022-02-24 LAB — GLUCOSE, CAPILLARY
Glucose-Capillary: 133 mg/dL — ABNORMAL HIGH (ref 70–99)
Glucose-Capillary: 139 mg/dL — ABNORMAL HIGH (ref 70–99)
Glucose-Capillary: 140 mg/dL — ABNORMAL HIGH (ref 70–99)
Glucose-Capillary: 146 mg/dL — ABNORMAL HIGH (ref 70–99)
Glucose-Capillary: 176 mg/dL — ABNORMAL HIGH (ref 70–99)
Glucose-Capillary: 176 mg/dL — ABNORMAL HIGH (ref 70–99)

## 2022-02-24 LAB — BLOOD GAS, ARTERIAL
Acid-Base Excess: 7.5 mmol/L — ABNORMAL HIGH (ref 0.0–2.0)
Bicarbonate: 34.1 mmol/L — ABNORMAL HIGH (ref 20.0–28.0)
FIO2: 50 %
O2 Saturation: 93.8 %
PEEP: 8 cmH2O
Patient temperature: 37
pCO2 arterial: 55 mmHg — ABNORMAL HIGH (ref 32–48)
pH, Arterial: 7.4 (ref 7.35–7.45)
pO2, Arterial: 68 mmHg — ABNORMAL LOW (ref 83–108)

## 2022-02-24 LAB — CBC
HCT: 61.9 % — ABNORMAL HIGH (ref 39.0–52.0)
Hemoglobin: 18.7 g/dL — ABNORMAL HIGH (ref 13.0–17.0)
MCH: 28.6 pg (ref 26.0–34.0)
MCHC: 30.2 g/dL (ref 30.0–36.0)
MCV: 94.6 fL (ref 80.0–100.0)
Platelets: 183 10*3/uL (ref 150–400)
RBC: 6.54 MIL/uL — ABNORMAL HIGH (ref 4.22–5.81)
RDW: 18.2 % — ABNORMAL HIGH (ref 11.5–15.5)
WBC: 7.6 10*3/uL (ref 4.0–10.5)
nRBC: 0.5 % — ABNORMAL HIGH (ref 0.0–0.2)

## 2022-02-24 LAB — MAGNESIUM: Magnesium: 2.3 mg/dL (ref 1.7–2.4)

## 2022-02-24 LAB — HEMOGLOBIN A1C
Hgb A1c MFr Bld: 6.5 % — ABNORMAL HIGH (ref 4.8–5.6)
Mean Plasma Glucose: 139.85 mg/dL

## 2022-02-24 LAB — PROCALCITONIN: Procalcitonin: 0.18 ng/mL

## 2022-02-24 LAB — TROPONIN I (HIGH SENSITIVITY): Troponin I (High Sensitivity): 71 ng/L — ABNORMAL HIGH (ref <18)

## 2022-02-24 LAB — TRIGLYCERIDES: Triglycerides: 127 mg/dL (ref <150)

## 2022-02-24 LAB — MRSA NEXT GEN BY PCR, NASAL: MRSA by PCR Next Gen: NOT DETECTED

## 2022-02-24 LAB — HIV ANTIBODY (ROUTINE TESTING W REFLEX): HIV Screen 4th Generation wRfx: NONREACTIVE

## 2022-02-24 LAB — PHOSPHORUS: Phosphorus: 3.6 mg/dL (ref 2.5–4.6)

## 2022-02-24 MED ORDER — CHLORHEXIDINE GLUCONATE CLOTH 2 % EX PADS
6.0000 | MEDICATED_PAD | Freq: Every day | CUTANEOUS | Status: DC
  Administered 2022-02-24 – 2022-04-08 (×45): 6 via TOPICAL

## 2022-02-24 MED ORDER — DEXMEDETOMIDINE HCL IN NACL 400 MCG/100ML IV SOLN
0.4000 ug/kg/h | INTRAVENOUS | Status: DC
  Administered 2022-02-24: 0.5 ug/kg/h via INTRAVENOUS
  Administered 2022-02-24 (×2): 0.4 ug/kg/h via INTRAVENOUS
  Filled 2022-02-24 (×3): qty 100

## 2022-02-24 MED ORDER — ORAL CARE MOUTH RINSE
15.0000 mL | OROMUCOSAL | Status: DC | PRN
Start: 2022-02-24 — End: 2022-03-11

## 2022-02-24 MED ORDER — ORAL CARE MOUTH RINSE
15.0000 mL | OROMUCOSAL | Status: DC
  Administered 2022-02-24 – 2022-03-11 (×188): 15 mL via OROMUCOSAL

## 2022-02-24 NOTE — Progress Notes (Signed)
PT was started on a sedation holiday per request from D. Nelson-NP. PT very quickly transitioned from sedated to heavy agitated with coordinated effort to grab ET tube and obvious effort to "tongue" ET tube out. Moving all extremities very strong and sitting up in bed. PT was given PRN Versed and Propofol was resumed  at prior dose of 50. Curlene Dolphin was updated of result once safely able to leave bedside. Sister had called after and stated he would be "very difficult" when talking about sedation and weaning him off of sedation.

## 2022-02-24 NOTE — Progress Notes (Signed)
NAME:  Bryan Mitchell, MRN:  [id number], DOB:  02/16/1961, LOS: 1 ADMISSION DATE:  02/23/2022, CONSULTATION DATE:  02/23/22 REFERRING MD:  Dr. Toy Cookey, CHIEF COMPLAINT:  SOB and fatigue   History of Present Illness:  61 yo M presenting to [facility] ED on 02/23/22 from home in a wheelchair with his sister for evaluation of worsening fatigue, poor appetite and SOB over the past 2 weeks. Per ED documentation, patient also complained of worsening bilateral leg swelling and pain.  Sister interviewed over the telephone providing supplemental history. She described that he lives alone, and began not feeling good for about 2 weeks with complaints of dyspnea and fatigue. He self-treated with Mucinex and took a COVID test which was negative. He started having increased congestion and poor PO intake. On the night of 8/18 the sister spoke with the patient on the phone and he told her, "I feel like I'm dying". Today on 8/19 she convinced the patient to go to the hospital to be evaluated. She reports he does not trust doctors and has difficulty following outpatient medication regimens. He was prescribed home O2, and has oxygen tanks at the house but has not used them in 2 years per his sister. He also injured his RIGHT knee a few weeks ago and had been getting around with a crutch. She reported he had been a little more confused on 8/19.  Per ED documentation, he endorsed a productive cough, but denied fever/chills. Patient admitted to not taking his Lasix when interviewed by [facility] service, per their documentation. And he has noticed increased pedal edema recently.  ED course: Upon arrival in triage the patient was profoundly hypoxic with his SpO2 was in the 60's on RA, this initially recovered to 99% on 3 L [place]. ABG revealed partially compensated hypercapnia and he was placed on BIPAP. He received Lasix for diuresis, BNP was mildly elevated. CT angio negative for pulmonary edema/effusions, negative for PE. [facility] service  consulted for admission. After a couple of hours on BIPAP, patient became combative and agitated, ripping off the BIPAP mask. Patient emergently intubated in ED. PCCM consulted. Medications given: IV contrast, Duo-nebs, lasix, fondaparinux, doxycycline, fentanyl, versed, ketamine, rocuronium & succinylcholine for intubation, solumedrol, propofol drip started Initial Vitals: 97.9, 20, 100, 117/68 & SpO2 62% on RA Significant labs: (Labs/ Imaging personally reviewed) I, Domingo Pulse [place], AGACNP-BC, personally viewed and interpreted this ECG. EKG Interpretation: Date: 02/23/22, EKG Time: 16:27, Rate: 98, Rhythm: NSR, QRS Axis:  borderline RAD, Intervals: normal, ST/T Wave abnormalities: inferior T wave inversions, Narrative Interpretation: NSR with borderline RAD and probable LAE with inferior T wave inversions Chemistry: Na+: 139, K+: 3.8, BUN/Cr.: 31/0.79, Serum CO2/ AG: 32/8 Hematology: WBC: 6.9, Hgb: 18.9, Hct: 63.3, plt: 178 BNP: 283.7, PCT: 0.22, COVID-19 & Influenza A/B: negative ABG: 7.25/ 99/ 59/ 43.4 CXR 02/23/22: pulmonary hypoinflation. Central pulmonary vascular engorgement without overt pulmonary edema. Stable tubes CT 02/23/22: no evidence of pulmonary embolism. Mild cardiomegaly, CAD, main pulmonary trunk is dilated measuring 3.9 cm suggesting pulmonary arterial hypertension. Mild bibasilar atelectasis. No evidence of pulmonary edema or pleural effusion. CT head wo contrast 02/23/22: pending  PCCM consulted due to worsening acute on chronic hypoxic/hypercapnic respiratory failure requiring emergent intubation and mechanical ventilatory support.  Pertinent  Medical History  T2DM COPD Morbid obesity Tobacco abuse (cigars, 40 pack year history) Secondary Polycythemia HFpEF  Significant Hospital Events: Including procedures, antibiotic start and stop dates in addition to other pertinent events   02/23/22: Admit to ICU with worsening  acute on chronic hypoxic/hypercapnic  respiratory failure s/t suspected COPD exacerbation in the setting of HFpEF and possible pulmonary hypertension requiring emergent intubation and mechanical ventilatory support. 02/24/22: Pt remains mechanically intubated settings decreased: PEEP 5/FiO2 40%.    Interim History / Subjective:  Pt remains mechanically intubated and sedated on propofol gtt and requiring levophed gtt @ 2 mcg/min. During WUA pt purposeful and attempting to pull out ETT will transition off propofol gtt and start precedex gtt to assist with SBT   Objective   Blood pressure 106/65, pulse 80, temperature 98.2 F (36.8 C), temperature source Bladder, resp. rate 16, height _0  (1.727 m), weight (!) 145 kg, SpO2 92 %.    Vent Mode: PRVC FiO2 (%):  [40 %-100 %] 40 % Set Rate:  [16 bmp] 16 bmp Vt Set:  [500 mL] 500 mL PEEP:  [8 cmH20] 8 cmH20 Plateau Pressure:  [19 cmH20] 19 cmH20   Intake/Output Summary (Last 24 hours) at 02/24/2022 0804 Last data filed at 02/24/2022 0600 Gross per 24 hour  Intake 1204.79 ml  Output 800 ml  Net 404.79 ml   Filed Weights   02/23/22 1604 02/24/22 0315  Weight: (!) 145.2 kg (!) 145 kg    Examination: General: Acutely ill appearing male resting in bed, NAD mechanically intubated  HEENT: Supple Neuro: RASS: -3, unable to follow commands, PERRL CV: s1s2 RRR, NSR on monitor, no r/m/g, trace BLE edema  Pulm: Faint rhonchi throughout, even, non labored  GI: +BS x4, obese, soft, non distended  GU: foley in place with clear yellow urine Skin: scattered papules on chest, axillae and groin- possible heat rash Extremities: Normal bulk and tone   Resolved Hospital Problem list     Assessment & Plan:  Acute on Chronic Hypoxic / Hypercapnic Respiratory Failure suspect multifocal in the setting of suspected COPD exacerbation, decompensated HFpEF, & rhinovirus   Tobacco Abuse PMHx: COPD, morbid obesity, tobacco abuse, HFpEF - Ventilator settings: PRVC  8 mL/kg, 40% FiO2, 5 PEEP,  continue ventilator support & lung protective strategies - Wean PEEP & FiO2 as tolerated, maintain SpO2 > 90% - Head of bed elevated 30 degrees, VAP protocol in place - Plateau pressures less than 30 cm H20  - Intermittent chest x-ray & ABG PRN - Daily WUA with SBT as tolerated  - Ensure adequate pulmonary hygiene  - F/u cultures, trend PCT - CAP coverage ceftriaxone & doxycycline - Budesonide nebulizer BID, Duo Nebs Q 6 h, bronchodilators PRN - PAD protocol in place: Will transition off of propofol gtt and start precedex, continue prn fentanyl  - Continue nicotine patch, smoking cessation counseling once extubated and stabilized   Acute on Chronic HFpEF exacerbation Query Pulmonary HTN PMHx: HFpEF  - Echocardiogram pending  - Trend troponin's until peaked  - Continuous cardiac monitoring  - Strict I/O's: alert provider if UOP < 0.5 mL/kg/hr - Daily BMP, replace electrolytes PRN - Daily weights to assess volume status - Diurese with the use of IV lasix as hemodynamics and renal function allow  Polycythemia suspected secondary to chronic hypoxia Followed by oncology outpatient, last seen 10/2019. He was offered a sleep study which he declined. At that time, he was deemed not a candidate for continued follow up as Hgb had stabilized at 17.0 & HCT was 51%.  Hgb: 18.9, Hct: 63.3 - daily CBC - Spoke with on call oncology NP on 08/20: no interventions required at this time due to CTA Chest negative for PE and pts noncompliance with outpatient  oxygen has likely worsened polycythemia.  Once pts oxygenation improves this will likely improve polycythemia pt can follow-up with hematology in outpatient setting.  Official hematology consult canceled   Type 2 Diabetes Mellitus Hemoglobin A1C: 6.5 - Monitor CBG Q 4 hours - SSI resistant dosing - target range while in ICU: 140-180 - follow ICU hyper/hypo-glycemia protocol  Best Practice (right click and "Reselect all SmartList Selections" daily)   Diet/type: NPO w/ meds via tube; if remains mechanically intubated will start TF's  DVT prophylaxis: Arixtra GI prophylaxis: PPI Lines: N/A Foley:  Yes, and it is still needed Code Status:  full code Last date of multidisciplinary goals of care discussion [02/23/22]  Labs   CBC: Recent Labs  Lab 02/23/22 1741 02/24/22 0531  WBC 6.9 7.6  HGB 18.9* 18.7*  HCT 63.3* 61.9*  MCV 94.9 94.6  PLT 178 542    Basic Metabolic Panel: Recent Labs  Lab 02/23/22 1741  NA 139  K 3.8  CL 99  CO2 32  GLUCOSE 142*  BUN 31*  CREATININE 0.79  CALCIUM 8.3*  MG 2.2  PHOS 3.7   GFR: Estimated Creatinine Clearance: 139.2 mL/min (by C-G formula based on SCr of 0.79 mg/dL). Recent Labs  Lab 02/23/22 1741 02/24/22 0531  PROCALCITON 0.22 0.18  WBC 6.9 7.6    Liver Function Tests: No results for input(s): "AST", "ALT", "ALKPHOS", "BILITOT", "PROT", "ALBUMIN" in the last 168 hours. No results for input(s): "LIPASE", "AMYLASE" in the last 168 hours. No results for input(s): "AMMONIA" in the last 168 hours.  ABG    Component Value Date/Time   PHART 7.4 02/24/2022 0453   PCO2ART 55 (H) 02/24/2022 0453   PO2ART 68 (L) 02/24/2022 0453   HCO3 34.1 (H) 02/24/2022 0453   TCO2 39 (H) 01/13/2018 0006   O2SAT 93.8 02/24/2022 0453     Coagulation Profile: No results for input(s): "INR", "PROTIME" in the last 168 hours.  Cardiac Enzymes: No results for input(s): "CKTOTAL", "CKMB", "CKMBINDEX", "TROPONINI" in the last 168 hours.  HbA1C: Hemoglobin A1C  Date/Time Value Ref Range Status  02/13/2018 01:52 PM 7.5 (A) 4.0 - 5.6 % Final   Hgb A1c MFr Bld  Date/Time Value Ref Range Status  02/23/2022 10:05 PM 6.5 (H) 4.8 - 5.6 % Final    Comment:    (NOTE) Pre diabetes:          5.7%-6.4%  Diabetes:              >6.4%  Glycemic control for   <7.0% adults with diabetes   01/13/2018 09:53 AM 7.3 (H) 4.8 - 5.6 % Final    Comment:    (NOTE) Pre diabetes:          5.7%-6.4% Diabetes:               >6.4% Glycemic control for   <7.0% adults with diabetes     CBG: Recent Labs  Lab 02/23/22 2113 02/23/22 2240 02/24/22 0404 02/24/22 0734  GLUCAP 166* 199* 176* 176*    Review of Systems:   UTA-patient intubated and sedated  Past Medical History:  He,  has a past medical history of (HFpEF) heart failure with preserved ejection fraction ([place]), Allergic reaction (12/15/2016), COPD (chronic obstructive pulmonary disease) ([place]), Hyperglycemia (12/2016), Hypertension, and Polycythemia.   Surgical History:   Past Surgical History:  Procedure Laterality Date   KNEE SURGERY Right      Social History:   reports that he has been smoking cigars. He has  never used smokeless tobacco. He reports current alcohol use. He reports that he does not use drugs.   Family History:  His family history includes CAD in his brother and father.   Allergies Allergies  Allergen Reactions   Other Anaphylaxis    McCormick's Rub (Food)   Beef-Derived Products Itching    Alpha gal allergy   Pork-Derived Products Hives    Alpha gal     Home Medications  Prior to Admission medications   Medication Sig Start Date End Date Taking? Authorizing Provider  albuterol (PROVENTIL HFA;VENTOLIN HFA) 108 (90 Base) MCG/ACT inhaler Inhale 2 puffs into the lungs every 6 (six) hours as needed for wheezing or shortness of breath. 02/03/18   Azzie Glatter, FNP  aspirin 81 MG tablet Take 81 mg by mouth daily.    [provider]  blood glucose meter kit and supplies Dispense based on patient and insurance preference. Use up to four times daily as directed. (FOR ICD-10 E10.9, E11.9). 01/15/18   Eugenie Filler, MD  buPROPion (WELLBUTRIN SR) 150 MG 12 hr tablet Take 150 mg by mouth 2 (two) times daily.    [provider]  diphenhydrAMINE (BENADRYL) 25 MG tablet Take 1 tablet (25 mg total) by mouth every 6 (six) hours. Patient taking differently: Take 25 mg by mouth daily.  09/07/17    Jola Schmidt, MD  fluticasone (FLONASE) 50 MCG/ACT nasal spray Place 2 sprays into both nostrils daily. 01/16/18   Eugenie Filler, MD  folic acid (FOLVITE) 1 MG tablet Take 1 tablet (1 mg total) by mouth daily. Patient not taking: Reported on 02/03/2018 01/16/18   Eugenie Filler, MD  furosemide (LASIX) 40 MG tablet Take 1 tablet (40 mg total) by mouth daily. 02/03/18   Azzie Glatter, FNP  meloxicam (MOBIC) 15 MG tablet Take 15 mg by mouth daily.    [provider]  phentermine 15 MG capsule Take 15 mg by mouth every morning.    [provider]  traMADol (ULTRAM) 50 MG tablet Take 1 tablet (50 mg total) by mouth every 8 (eight) hours as needed. 02/13/18   Azzie Glatter, FNP  umeclidinium bromide (INCRUSE ELLIPTA) 62.5 MCG/INH AEPB Inhale 1 puff into the lungs daily. Patient not taking: Reported on 07/28/2019 02/11/18   Azzie Glatter, FNP     Critical care time: 54 minutes      Donell Beers, [place] Pager [phone] (please enter 7 digits) PCCM Consult Pager [phone] (please enter 7 digits)

## 2022-02-24 NOTE — Consult Note (Signed)
PHARMACY CONSULT NOTE - FOLLOW UP  Pharmacy Consult for Electrolyte Monitoring and Replacement   Recent Labs: Potassium (mmol/L)  Date Value  02/23/2022 3.8  04/21/2012 4.8   Magnesium (mg/dL)  Date Value  18/56/3149 2.2   Calcium (mg/dL)  Date Value  70/26/3785 8.3 (L)   Calcium, Total (mg/dL)  Date Value  88/50/2774 8.8   Albumin (g/dL)  Date Value  12/87/8676 4.1  04/21/2012 4.0   Phosphorus (mg/dL)  Date Value  72/03/4708 3.7   Sodium (mmol/L)  Date Value  02/23/2022 139  04/21/2012 139     Assessment: Pharmacy has been consulted to monitor electrolytes in 60yo male presenting to the ED complaining of worsening bilateral leg swelling and pain. He was self-treating with mucinex and took a COVID test which was negative. Patient still having productive cough, but denied fever/chills, and also mentioned that he hasn't been taking his lasix and has noticed increased pedal edema recently.   Goal of Therapy:  Electrolytes WNL  Plan:  No supplementation currently needed Will recheck electrolytes with AM labs   Bettey Costa ,PharmD Clinical Pharmacist 02/24/2022 7:31 AM

## 2022-02-24 NOTE — Progress Notes (Signed)
*  PRELIMINARY RESULTS* Echocardiogram 2D Echocardiogram has been performed.  Lenor Coffin 02/24/2022, 10:57 AM

## 2022-02-25 ENCOUNTER — Inpatient Hospital Stay: Payer: Medicaid Other

## 2022-02-25 LAB — BLOOD GAS, ARTERIAL
Acid-Base Excess: 12.8 mmol/L — ABNORMAL HIGH (ref 0.0–2.0)
Bicarbonate: 39.8 mmol/L — ABNORMAL HIGH (ref 20.0–28.0)
FIO2: 80 %
O2 Saturation: 94.7 %
PEEP: 10 cmH2O
Patient temperature: 37
pCO2 arterial: 60 mmHg — ABNORMAL HIGH (ref 32–48)
pH, Arterial: 7.43 (ref 7.35–7.45)
pO2, Arterial: 69 mmHg — ABNORMAL LOW (ref 83–108)

## 2022-02-25 LAB — BASIC METABOLIC PANEL
Anion gap: 9 (ref 5–15)
BUN: 24 mg/dL — ABNORMAL HIGH (ref 6–20)
CO2: 32 mmol/L (ref 22–32)
Calcium: 8.6 mg/dL — ABNORMAL LOW (ref 8.9–10.3)
Chloride: 100 mmol/L (ref 98–111)
Creatinine, Ser: 0.8 mg/dL (ref 0.61–1.24)
GFR, Estimated: >60 mL/min (ref >60)
Glucose, Bld: 120 mg/dL — ABNORMAL HIGH (ref 70–99)
Potassium: 3.9 mmol/L (ref 3.5–5.1)
Sodium: 141 mmol/L (ref 135–145)

## 2022-02-25 LAB — CBC WITH DIFFERENTIAL/PLATELET
Abs Immature Granulocytes: 0.05 10*3/uL (ref 0.00–0.07)
Basophils Absolute: 0 10*3/uL (ref 0.0–0.1)
Basophils Relative: 0 %
Eosinophils Absolute: 0.1 10*3/uL (ref 0.0–0.5)
Eosinophils Relative: 1 %
HCT: 63.8 % — ABNORMAL HIGH (ref 39.0–52.0)
Hemoglobin: 19.1 g/dL — ABNORMAL HIGH (ref 13.0–17.0)
Immature Granulocytes: 1 %
Lymphocytes Relative: 15 %
Lymphs Abs: 1.1 10*3/uL (ref 0.7–4.0)
MCH: 28 pg (ref 26.0–34.0)
MCHC: 29.9 g/dL — ABNORMAL LOW (ref 30.0–36.0)
MCV: 93.7 fL (ref 80.0–100.0)
Monocytes Absolute: 0.5 10*3/uL (ref 0.1–1.0)
Monocytes Relative: 7 %
Neutro Abs: 5.6 10*3/uL (ref 1.7–7.7)
Neutrophils Relative %: 76 %
Platelets: 163 10*3/uL (ref 150–400)
RBC: 6.81 MIL/uL — ABNORMAL HIGH (ref 4.22–5.81)
RDW: 18.3 % — ABNORMAL HIGH (ref 11.5–15.5)
WBC: 7.4 10*3/uL (ref 4.0–10.5)
nRBC: 0 % (ref 0.0–0.2)

## 2022-02-25 LAB — GLUCOSE, CAPILLARY
Glucose-Capillary: 103 mg/dL — ABNORMAL HIGH (ref 70–99)
Glucose-Capillary: 104 mg/dL — ABNORMAL HIGH (ref 70–99)
Glucose-Capillary: 105 mg/dL — ABNORMAL HIGH (ref 70–99)
Glucose-Capillary: 116 mg/dL — ABNORMAL HIGH (ref 70–99)
Glucose-Capillary: 139 mg/dL — ABNORMAL HIGH (ref 70–99)
Glucose-Capillary: 160 mg/dL — ABNORMAL HIGH (ref 70–99)
Glucose-Capillary: 94 mg/dL (ref 70–99)

## 2022-02-25 LAB — MAGNESIUM: Magnesium: 2 mg/dL (ref 1.7–2.4)

## 2022-02-25 LAB — PHOSPHORUS: Phosphorus: 4.2 mg/dL (ref 2.5–4.6)

## 2022-02-25 LAB — PROCALCITONIN: Procalcitonin: <0.1 ng/mL

## 2022-02-25 MED ORDER — FUROSEMIDE 10 MG/ML IJ SOLN
40.0000 mg | Freq: Once | INTRAMUSCULAR | Status: AC
  Administered 2022-02-25: 40 mg via INTRAVENOUS
  Filled 2022-02-25: qty 4

## 2022-02-25 MED ORDER — ADULT MULTIVITAMIN LIQUID CH
15.0000 mL | Freq: Every day | ORAL | Status: DC
  Administered 2022-02-26 – 2022-03-02 (×5): 15 mL
  Filled 2022-02-25 (×6): qty 15

## 2022-02-25 MED ORDER — DIAZEPAM 5 MG PO TABS
5.0000 mg | ORAL_TABLET | Freq: Four times a day (QID) | ORAL | Status: DC
  Administered 2022-02-25 – 2022-03-05 (×33): 5 mg
  Filled 2022-02-25 (×33): qty 1

## 2022-02-25 MED ORDER — FREE WATER
30.0000 mL | Status: DC
  Administered 2022-02-25 – 2022-02-27 (×12): 30 mL

## 2022-02-25 MED ORDER — FENTANYL 2500MCG IN NS 250ML (10MCG/ML) PREMIX INFUSION
50.0000 ug/h | INTRAVENOUS | Status: DC
  Administered 2022-02-25: 50 ug/h via INTRAVENOUS
  Administered 2022-02-26: 125 ug/h via INTRAVENOUS
  Administered 2022-02-26: 150 ug/h via INTRAVENOUS
  Administered 2022-02-27 – 2022-03-01 (×2): 75 ug/h via INTRAVENOUS
  Administered 2022-03-02 – 2022-03-03 (×2): 100 ug/h via INTRAVENOUS
  Administered 2022-03-04 – 2022-03-05 (×2): 150 ug/h via INTRAVENOUS
  Administered 2022-03-06 – 2022-03-09 (×4): 100 ug/h via INTRAVENOUS
  Administered 2022-03-10: 150 ug/h via INTRAVENOUS
  Filled 2022-02-25 (×14): qty 250

## 2022-02-25 MED ORDER — VITAL HIGH PROTEIN PO LIQD
1000.0000 mL | ORAL | Status: DC
  Administered 2022-02-25 – 2022-02-26 (×4): 1000 mL

## 2022-02-25 MED ORDER — FENTANYL BOLUS VIA INFUSION
50.0000 ug | INTRAVENOUS | Status: DC | PRN
  Administered 2022-02-25 – 2022-02-27 (×5): 100 ug via INTRAVENOUS
  Administered 2022-03-03: 50 ug via INTRAVENOUS
  Administered 2022-03-05 – 2022-03-06 (×5): 100 ug via INTRAVENOUS
  Administered 2022-03-09: 50 ug via INTRAVENOUS

## 2022-02-25 NOTE — Consult Note (Signed)
PHARMACY CONSULT NOTE  Pharmacy Consult for Electrolyte Monitoring and Replacement   Recent Labs: Potassium (mmol/L)  Date Value  02/25/2022 3.9  04/21/2012 4.8   Magnesium (mg/dL)  Date Value  25/00/3704 2.0   Calcium (mg/dL)  Date Value  88/89/1694 8.6 (L)   Calcium, Total (mg/dL)  Date Value  50/38/8828 8.8   Albumin (g/dL)  Date Value  00/34/9179 4.1  04/21/2012 4.0   Phosphorus (mg/dL)  Date Value  15/11/6977 4.2   Sodium (mmol/L)  Date Value  02/25/2022 141  04/21/2012 139    Assessment: Pharmacy has been consulted to monitor electrolytes in 60yo male presenting to the ED complaining of worsening bilateral leg swelling and pain. He was self-treating with mucinex and took a COVID test which was negative. He is currently intubated and sedated in CCU. Pharmacy is asked to follow and replace electrolytes while in CCU   Goal of Therapy:  Electrolytes WNL  Plan:  No supplementation currently needed Will recheck electrolytes with AM labs   Lowella Bandy ,PharmD Clinical Pharmacist 02/25/2022 6:57 AM

## 2022-02-25 NOTE — Progress Notes (Signed)
NAME:  Bryan Mitchell, MRN:  [id number], DOB:  October 25, 1960, LOS: 2 ADMISSION DATE:  02/23/2022, CONSULTATION DATE:  02/23/22 REFERRING MD:  Dr. Toy Cookey, CHIEF COMPLAINT:  SOB and fatigue   History of Present Illness:  61 yo M presenting to [facility] ED on 02/23/22 from home in a wheelchair with his sister for evaluation of worsening fatigue, poor appetite and SOB over the past 2 weeks. Per ED documentation, patient also complained of worsening bilateral leg swelling and pain.  Sister interviewed over the telephone providing supplemental history. She described that he lives alone, and began not feeling good for about 2 weeks with complaints of dyspnea and fatigue. He self-treated with Mucinex and took a COVID test which was negative. He started having increased congestion and poor PO intake. On the night of 8/18 the sister spoke with the patient on the phone and he told her, "I feel like I'm dying". Today on 8/19 she convinced the patient to go to the hospital to be evaluated. She reports he does not trust doctors and has difficulty following outpatient medication regimens. He was prescribed home O2, and has oxygen tanks at the house but has not used them in 2 years per his sister. He also injured his RIGHT knee a few weeks ago and had been getting around with a crutch. She reported he had been a little more confused on 8/19.  Per ED documentation, he endorsed a productive cough, but denied fever/chills. Patient admitted to not taking his Lasix when interviewed by [facility] service, per their documentation. And he has noticed increased pedal edema recently.  ED course: Upon arrival in triage the patient was profoundly hypoxic with his SpO2 was in the 60's on RA, this initially recovered to 99% on 3 L [place]. ABG revealed partially compensated hypercapnia and he was placed on BIPAP. He received Lasix for diuresis, BNP was mildly elevated. CT angio negative for pulmonary edema/effusions, negative for PE. [facility] service  consulted for admission. After a couple of hours on BIPAP, patient became combative and agitated, ripping off the BIPAP mask. Patient emergently intubated in ED. PCCM consulted. Medications given: IV contrast, Duo-nebs, lasix, fondaparinux, doxycycline, fentanyl, versed, ketamine, rocuronium & succinylcholine for intubation, solumedrol, propofol drip started Initial Vitals: 97.9, 20, 100, 117/68 & SpO2 62% on RA Significant labs: (Labs/ Imaging personally reviewed) I, Domingo Pulse [place], AGACNP-BC, personally viewed and interpreted this ECG. EKG Interpretation: Date: 02/23/22, EKG Time: 16:27, Rate: 98, Rhythm: NSR, QRS Axis:  borderline RAD, Intervals: normal, ST/T Wave abnormalities: inferior T wave inversions, Narrative Interpretation: NSR with borderline RAD and probable LAE with inferior T wave inversions Chemistry: Na+: 139, K+: 3.8, BUN/Cr.: 31/0.79, Serum CO2/ AG: 32/8 Hematology: WBC: 6.9, Hgb: 18.9, Hct: 63.3, plt: 178 BNP: 283.7, PCT: 0.22, COVID-19 & Influenza A/B: negative ABG: 7.25/ 99/ 59/ 43.4 CXR 02/23/22: pulmonary hypoinflation. Central pulmonary vascular engorgement without overt pulmonary edema. Stable tubes CT 02/23/22: no evidence of pulmonary embolism. Mild cardiomegaly, CAD, main pulmonary trunk is dilated measuring 3.9 cm suggesting pulmonary arterial hypertension. Mild bibasilar atelectasis. No evidence of pulmonary edema or pleural effusion. CT head wo contrast 02/23/22: pending  PCCM consulted due to worsening acute on chronic hypoxic/hypercapnic respiratory failure requiring emergent intubation and mechanical ventilatory support.  Pertinent  Medical History  T2DM COPD Morbid obesity Tobacco abuse (cigars, 40 pack year history) Secondary Polycythemia HFpEF  Micro Data:  8/19: SARS-CoV-2 & Influenza PCR>>negative 8/19: RVP>> Rhinovirus/Enterovirus 8/19: MRSA PCR>> negative 8/20: Tracheal aspirate>> gram + cocci, gram + rods,  gram - rods  Antimicrobials:   Ceftriaxone 8/19>> Doxycycline 8/19>>  Significant Hospital Events: Including procedures, antibiotic start and stop dates in addition to other pertinent events   02/23/22: Admit to ICU with worsening acute on chronic hypoxic/hypercapnic respiratory failure s/t suspected COPD exacerbation in the setting of HFpEF and possible pulmonary hypertension requiring emergent intubation and mechanical ventilatory support. 02/24/22: Pt remains mechanically intubated settings decreased: PEEP 5/FiO2 40%.   02/25/22: Increased vent requirements this morning due to diffuse pulmonary vascular congestion, received lasix. Tracheal aspirate with gram + cocci & rods, gram - rods.  Severe Delirium upon WUA.  Interim History / Subjective:  -Overnight with desaturations into 70's requiring increased Vent support to 80% FiO2 & 10 PEEP ~  CXR concerning for increasing diffuse pulmonary vascular congestion -Received 40 mg IV Lasix, now with improvement in vent support -Afebrile, hemodynamically stable, on Levophed -Preliminary tracheal aspirate with gram + cocci & rods, gram - rods -Upon WUA, becomes agitated, reaching for ETT, unable to be redirected  Objective   Blood pressure 98/66, pulse 67, temperature 98.4 F (36.9 C), resp. rate 16, height _0  (1.727 m), weight (!) 139.7 kg, SpO2 (!) 87 %.    Vent Mode: PRVC FiO2 (%):  [40 %-80 %] 60 % Set Rate:  [16 bmp] 16 bmp Vt Set:  [500 mL] 500 mL PEEP:  [5 cmH20-10 cmH20] 8 cmH20 Plateau Pressure:  [20 KVQ25-95 cmH20] 21 cmH20   Intake/Output Summary (Last 24 hours) at 02/25/2022 0747 Last data filed at 02/25/2022 0600 Gross per 24 hour  Intake 967.25 ml  Output 2670 ml  Net -1702.75 ml    Filed Weights   02/23/22 1604 02/24/22 0315 02/25/22 0354  Weight: (!) 145.2 kg (!) 145 kg (!) 139.7 kg    Examination: General: Acutely ill appearing male ,resting in bed, NAD mechanically intubated  HEENT: Supple, atraumatic, normocephalic Neuro: RASS: -1, arouses to  voice, delirious/agitated, purposeful movent of extremities but does not follow commands, PERRL CV: s1s2 RRR, NSR on monitor, no r/m/g, trace BLE edema  Pulm: Coarse breath sounds throughout, even, non labored  GI: +BS x4, obese, soft, non distended  GU: foley in place with clear yellow urine Skin: scattered papules on chest, axillae and groin- possible heat rash Extremities: Normal bulk and tone   Resolved Hospital Problem list     Assessment & Plan:   Acute on Chronic Hypoxic / Hypercapnic Respiratory Failure suspect multifocal in the setting of suspected COPD exacerbation, decompensated HFpEF,  rhinovirus , & suspected superimposed bacterial pneumonia  Tobacco Abuse PMHx: COPD, morbid obesity, tobacco abuse, HFpEF -Full vent support, implement lung protective strategies -Plateau pressures less than 30 cm H20 -Wean FiO2 & PEEP as tolerated to maintain O2 sats 88 to 92% -Follow intermittent Chest X-ray & ABG as needed -Spontaneous Breathing Trials when respiratory parameters met and mental status permits -VAP Bundle -Bronchodilators & Pulmicort nebs -ABX as above -Diuresis as BP and renal function permits ~ given 40 mg IV Lasix 8/21  Shock: ? Septic vs. Sedation related Acute on Chronic HFpEF exacerbation Mildly elevated Troponin, suspect demand ischemia Query Pulmonary HTN PMHx: HFpEF  Echocardiogram 02/24/22: LVEF 60-65%, Grade II DD -Continuous cardiac monitoring -Maintain MAP >65 -Cautious IV fluids -Vasopressors as needed to maintain MAP goal -Trend lactic acid until normalized -Trend HS Troponin until peaked (59 ~ 71 ~ ) -Diurese as BP and renal function permits ~ received 40 mg IV Lasix 8/21  Rhinovirus Suspected superimposed bacterial Pneumonia -Monitor fever curve -  Trend WBC's & Procalcitonin -Follow cultures as above -Continue empiric Ceftriaxone & Doxycycline pending cultures & sensitivities  Polycythemia suspected secondary to chronic hypoxia Followed by  oncology outpatient, last seen 10/2019. He was offered a sleep study which he declined. At that time, he was deemed not a candidate for continued follow up as Hgb had stabilized at 17.0 & HCT was 51%.  Hgb: 18.9, Hct: 63.3 - daily CBC - Spoke with on call oncology NP on 08/20: no interventions required at this time due to CTA Chest negative for PE and pts noncompliance with outpatient oxygen has likely worsened polycythemia.  Once pts oxygenation improves this will likely improve polycythemia pt can follow-up with hematology in outpatient setting.  Official hematology consult canceled   Type 2 Diabetes Mellitus Hemoglobin A1C: 6.5 - Monitor CBG Q 4 hours - SSI resistant dosing - target range while in ICU: 140-180 - follow ICU hyper/hypo-glycemia protocol  Acute Metabolic Encephalopathy Sedation needs in the setting of mechanical ventilation -Treatment of metabolic derangements as outlined above -Maintain a RASS goal of 0 to -1 -Fentanyl and Propofol to maintain RASS goal -Avoid sedating medications as able -Daily wake up assessment -CT Head negative 8/19   Best Practice (right click and "Reselect all SmartList Selections" daily)  Diet/type: NPO w/ meds via tube; if remains mechanically intubated will start TF's  DVT prophylaxis: Arixtra GI prophylaxis: PPI Lines: N/A Foley:  Yes, and it is still needed Code Status:  full code Last date of multidisciplinary goals of care discussion [02/25/22]  Labs   CBC: Recent Labs  Lab 02/23/22 1741 02/24/22 0531 02/24/22 1007 02/25/22 0609  WBC 6.9 7.6 9.3 7.4  NEUTROABS  --   --  7.9* 5.6  HGB 18.9* 18.7* 18.6* 19.1*  HCT 63.3* 61.9* 61.8* 63.8*  MCV 94.9 94.6 94.5 93.7  PLT 178 183 186 163     Basic Metabolic Panel: Recent Labs  Lab 02/23/22 1741 02/24/22 0531 02/25/22 0609  NA 139 142 141  K 3.8 4.3 3.9  CL 99 102 100  CO2 32 26 32  GLUCOSE 142* 160* 120*  BUN 31* 28* 24*  CREATININE 0.79 0.84 0.80  CALCIUM 8.3* 8.6*  8.6*  MG 2.2 2.3 2.0  PHOS 3.7 3.6 4.2    GFR: Estimated Creatinine Clearance: 136.3 mL/min (by C-G formula based on SCr of 0.8 mg/dL). Recent Labs  Lab 02/23/22 1741 02/24/22 0531 02/24/22 1007 02/25/22 0609  PROCALCITON 0.22 0.18  --  <0.10  WBC 6.9 7.6 9.3 7.4     Liver Function Tests: No results for input(s): "AST", "ALT", "ALKPHOS", "BILITOT", "PROT", "ALBUMIN" in the last 168 hours. No results for input(s): "LIPASE", "AMYLASE" in the last 168 hours. No results for input(s): "AMMONIA" in the last 168 hours.  ABG    Component Value Date/Time   PHART 7.43 02/25/2022 0500   PCO2ART 60 (H) 02/25/2022 0500   PO2ART 69 (L) 02/25/2022 0500   HCO3 39.8 (H) 02/25/2022 0500   TCO2 39 (H) 01/13/2018 0006   O2SAT 94.7 02/25/2022 0500     Coagulation Profile: No results for input(s): "INR", "PROTIME" in the last 168 hours.  Cardiac Enzymes: No results for input(s): "CKTOTAL", "CKMB", "CKMBINDEX", "TROPONINI" in the last 168 hours.  HbA1C: Hemoglobin A1C  Date/Time Value Ref Range Status  02/13/2018 01:52 PM 7.5 (A) 4.0 - 5.6 % Final   Hgb A1c MFr Bld  Date/Time Value Ref Range Status  02/23/2022 10:05 PM 6.5 (H) 4.8 - 5.6 % Final  Comment:    (NOTE) Pre diabetes:          5.7%-6.4%  Diabetes:              >6.4%  Glycemic control for   <7.0% adults with diabetes   01/13/2018 09:53 AM 7.3 (H) 4.8 - 5.6 % Final    Comment:    (NOTE) Pre diabetes:          5.7%-6.4% Diabetes:              >6.4% Glycemic control for   <7.0% adults with diabetes     CBG: Recent Labs  Lab 02/24/22 1128 02/24/22 1554 02/24/22 2001 02/24/22 2333 02/25/22 0329  GLUCAP 139* 133* 146* 140* 104*     Review of Systems:   UTA-patient intubated and sedated  Past Medical History:  He,  has a past medical history of (HFpEF) heart failure with preserved ejection fraction ([place]), Allergic reaction (12/15/2016), COPD (chronic obstructive pulmonary disease) ([place]), Hyperglycemia  (12/2016), Hypertension, and Polycythemia.   Surgical History:   Past Surgical History:  Procedure Laterality Date   KNEE SURGERY Right      Social History:   reports that he has been smoking cigars. He has never used smokeless tobacco. He reports current alcohol use. He reports that he does not use drugs.   Family History:  His family history includes CAD in his brother and father.   Allergies Allergies  Allergen Reactions   Other Anaphylaxis    McCormick's Rub (Food)   Beef-Derived Products Itching    Alpha gal allergy   Pork-Derived Products Hives    Alpha gal     Home Medications  Prior to Admission medications   Medication Sig Start Date End Date Taking? Authorizing Provider  albuterol (PROVENTIL HFA;VENTOLIN HFA) 108 (90 Base) MCG/ACT inhaler Inhale 2 puffs into the lungs every 6 (six) hours as needed for wheezing or shortness of breath. 02/03/18   Azzie Glatter, FNP  aspirin 81 MG tablet Take 81 mg by mouth daily.    [provider]  blood glucose meter kit and supplies Dispense based on patient and insurance preference. Use up to four times daily as directed. (FOR ICD-10 E10.9, E11.9). 01/15/18   Eugenie Filler, MD  buPROPion (WELLBUTRIN SR) 150 MG 12 hr tablet Take 150 mg by mouth 2 (two) times daily.    [provider]  diphenhydrAMINE (BENADRYL) 25 MG tablet Take 1 tablet (25 mg total) by mouth every 6 (six) hours. Patient taking differently: Take 25 mg by mouth daily.  09/07/17   Jola Schmidt, MD  fluticasone (FLONASE) 50 MCG/ACT nasal spray Place 2 sprays into both nostrils daily. 01/16/18   Eugenie Filler, MD  folic acid (FOLVITE) 1 MG tablet Take 1 tablet (1 mg total) by mouth daily. Patient not taking: Reported on 02/03/2018 01/16/18   Eugenie Filler, MD  furosemide (LASIX) 40 MG tablet Take 1 tablet (40 mg total) by mouth daily. 02/03/18   Azzie Glatter, FNP  meloxicam (MOBIC) 15 MG tablet Take 15 mg by mouth daily.    [provider]  phentermine 15 MG capsule Take 15 mg by mouth every morning.    [provider]  traMADol (ULTRAM) 50 MG tablet Take 1 tablet (50 mg total) by mouth every 8 (eight) hours as needed. 02/13/18   Azzie Glatter, FNP  umeclidinium bromide (INCRUSE ELLIPTA) 62.5 MCG/INH AEPB Inhale 1 puff into the lungs daily. Patient not taking: Reported  on 07/28/2019 02/11/18   Azzie Glatter, FNP     Critical care time: 40 minutes    Darel Hong, AGACNP-BC [facility] Prefer epic messenger for cross cover needs If after hours, please call E-link

## 2022-02-25 NOTE — Progress Notes (Signed)
Initial Nutrition Assessment  DOCUMENTATION CODES:   Morbid obesity  INTERVENTION:   Vital HP @40ml /hr continuous   Propofol: 43.6 ml/hr- provides 1151kcal/day   Free water flushes 61ml q4 hours to maintain tube patency   Regimen provides 2111kcal/day, 84g/day protein and 93ml/day of free water.   Liquid MVI daily via tube   Pt at high refeed risk; recommend monitor potassium, magnesium and phosphorus labs daily until stable  NUTRITION DIAGNOSIS:   Inadequate oral intake related to inability to eat (pt sedated and ventilated) as evidenced by NPO status.  GOAL:   Provide needs based on ASPEN/SCCM guidelines  MONITOR:   Vent status, Labs, Weight trends, TF tolerance, Skin, I & O's  REASON FOR ASSESSMENT:   Ventilator    ASSESSMENT:   61 y/o male with h/o COPD, DM, HTN, HLD, CHF, polycythemia and alpha gal who is admitted with rhinovirus, PNA and AMS.  Pt sedated and ventilated. OGT in place. Will plan to start tube feeds today. Pt with noted alpha gal allergy. There is no documented weight history in chart to determine if any significant recent weight changes.   Medications reviewed and include: colace, insulin, nicotine, protonix, miralax, ceftriaxone, doxycycline, propofol   Labs reviewed: K 3.9 wnl, BUN 24(H), P 4.2 wnl, Mg 2.0 wnl Hgb 19.1(H), Hct 63.8(H) Cbgs- 116, 139, 104 x 24 hrs  Patient is currently intubated on ventilator support MV: 8.0 L/min Temp (24hrs), Avg:98.4 F (36.9 C), Min:98.1 F (36.7 C), Max:98.8 F (37.1 C)  Propofol: 43.6 ml/hr- provides 1151kcal/day   MAP- >93mmHg   UOP- 77m   NUTRITION - FOCUSED PHYSICAL EXAM:  Flowsheet Row Most Recent Value  Orbital Region No depletion  Upper Arm Region No depletion  Thoracic and Lumbar Region No depletion  Buccal Region No depletion  Temple Region No depletion  Clavicle Bone Region No depletion  Clavicle and Acromion Bone Region No depletion  Scapular Bone Region No depletion   Dorsal Hand No depletion  Patellar Region No depletion  Anterior Thigh Region No depletion  Posterior Calf Region No depletion  Edema (RD Assessment) Moderate  Hair Reviewed  Eyes Reviewed  Mouth Reviewed  Skin Reviewed  Nails Reviewed   Diet Order:   Diet Order             Diet NPO time specified  Diet effective now                  EDUCATION NEEDS:   No education needs have been identified at this time  Skin:  Skin Assessment: Reviewed RN Assessment (ecchymosis)  Last BM:  PTA  Height:   Ht Readings from Last 1 Encounters:  02/23/22 5\' 8"  (1.727 m)    Weight:   Wt Readings from Last 1 Encounters:  02/25/22 (!) 139.7 kg    Ideal Body Weight:  70 kg  BMI:  Body mass index is 46.83 kg/m.  Estimated Nutritional Needs:   Kcal:  1536-1955kcal/day  Protein:  150-175g/day  Fluid:  2.1-2.4L/day  MS, RD, LDN Please refer to [facility] for RD and/or RD on-call/weekend/after hours pager

## 2022-02-25 NOTE — Plan of Care (Signed)

## 2022-02-25 NOTE — Progress Notes (Signed)
0800 patient combative pulled off mitts patient seems to be following some commands hard to tell with agitation  increase to sedation for patient safety 1000 spoke with sister of patient on phone when she called updated on plan of care at this time

## 2022-02-25 NOTE — Progress Notes (Signed)
Acute on Chronic Hypoxic/ Hypercapnic Respiratory Failure Patient desaturating into the high 70's. Ventilator settings adjusted to 80%, PEEP 10. ABG obtained immediately: 7.43/ 60/ 69/ 39.8 CXR reveals diffuse pulmonary vascular congestion & nursing reports poor UOP - 40 mg of Lasix given >> no UOP - bladder scan revealed > 700 mL in bladder, foley catheter exchanged - Patient tolerated weaning ventilator settings: 50% & PEEP 8    Betsey Holiday, AGACNP-BC Acute Care Nurse Practitioner [facility]   [phone] / [phone] Please see Amion for pager details.

## 2022-02-26 ENCOUNTER — Inpatient Hospital Stay: Payer: Medicaid Other

## 2022-02-26 LAB — GLUCOSE, CAPILLARY
Glucose-Capillary: 110 mg/dL — ABNORMAL HIGH (ref 70–99)
Glucose-Capillary: 107 mg/dL — ABNORMAL HIGH (ref 70–99)
Glucose-Capillary: 115 mg/dL — ABNORMAL HIGH (ref 70–99)
Glucose-Capillary: 106 mg/dL — ABNORMAL HIGH (ref 70–99)
Glucose-Capillary: 97 mg/dL (ref 70–99)
Glucose-Capillary: 109 mg/dL — ABNORMAL HIGH (ref 70–99)

## 2022-02-26 LAB — BASIC METABOLIC PANEL
Anion gap: 6 (ref 5–15)
BUN: 24 mg/dL — ABNORMAL HIGH (ref 6–20)
CO2: 34 mmol/L — ABNORMAL HIGH (ref 22–32)
Calcium: 8.1 mg/dL — ABNORMAL LOW (ref 8.9–10.3)
Chloride: 101 mmol/L (ref 98–111)
Creatinine, Ser: 0.59 mg/dL — ABNORMAL LOW (ref 0.61–1.24)
GFR, Estimated: >60 mL/min (ref >60)
Glucose, Bld: 99 mg/dL (ref 70–99)
Potassium: 3.7 mmol/L (ref 3.5–5.1)
Sodium: 141 mmol/L (ref 135–145)

## 2022-02-26 LAB — CULTURE, RESPIRATORY W GRAM STAIN: Culture: NORMAL

## 2022-02-26 LAB — CBC
HCT: 60.2 % — ABNORMAL HIGH (ref 39.0–52.0)
Hemoglobin: 17.8 g/dL — ABNORMAL HIGH (ref 13.0–17.0)
MCH: 27.6 pg (ref 26.0–34.0)
MCHC: 29.6 g/dL — ABNORMAL LOW (ref 30.0–36.0)
MCV: 93.3 fL (ref 80.0–100.0)
Platelets: 142 10*3/uL — ABNORMAL LOW (ref 150–400)
RBC: 6.45 MIL/uL — ABNORMAL HIGH (ref 4.22–5.81)
RDW: 18.2 % — ABNORMAL HIGH (ref 11.5–15.5)
WBC: 5.5 10*3/uL (ref 4.0–10.5)
nRBC: 0 % (ref 0.0–0.2)

## 2022-02-26 LAB — MAGNESIUM: Magnesium: 2 mg/dL (ref 1.7–2.4)

## 2022-02-26 LAB — PHOSPHORUS: Phosphorus: 4.3 mg/dL (ref 2.5–4.6)

## 2022-02-26 MED ORDER — METOLAZONE 5 MG PO TABS
5.0000 mg | ORAL_TABLET | Freq: Once | ORAL | Status: AC
Start: 2022-02-26 — End: 2022-02-26
  Administered 2022-02-26: 5 mg
  Filled 2022-02-26: qty 1

## 2022-02-26 MED ORDER — ACETAZOLAMIDE 250 MG PO TABS
500.0000 mg | ORAL_TABLET | Freq: Once | ORAL | Status: AC
  Administered 2022-02-26: 500 mg
  Filled 2022-02-26: qty 2

## 2022-02-26 MED ORDER — VITAL HIGH PROTEIN PO LIQD
1000.0000 mL | ORAL | Status: DC
  Administered 2022-02-26 – 2022-02-27 (×2): 1000 mL

## 2022-02-26 NOTE — Consult Note (Signed)
PHARMACY CONSULT NOTE  Pharmacy Consult for Electrolyte Monitoring and Replacement   Recent Labs: Potassium (mmol/L)  Date Value  02/26/2022 3.7  04/21/2012 4.8   Magnesium (mg/dL)  Date Value  34/91/7915 2.0   Calcium (mg/dL)  Date Value  05/69/7948 8.1 (L)   Calcium, Total (mg/dL)  Date Value  01/65/5374 8.8   Albumin (g/dL)  Date Value  82/70/7867 4.1  04/21/2012 4.0   Phosphorus (mg/dL)  Date Value  54/49/2010 4.3   Sodium (mmol/L)  Date Value  02/26/2022 141  04/21/2012 139    Assessment: Pharmacy has been consulted to monitor electrolytes in 60yo male presenting to the ED complaining of worsening bilateral leg swelling and pain. He was self-treating with mucinex and took a COVID test which was negative. He is currently intubated and sedated in CCU. Pharmacy is asked to follow and replace electrolytes while in CCU   Goal of Therapy:  Electrolytes WNL  Plan:  No supplementation currently needed Will recheck electrolytes with AM labs   Lowella Bandy ,PharmD Clinical Pharmacist 02/26/2022 7:06 AM

## 2022-02-26 NOTE — Progress Notes (Signed)
NAME:  Kinneth Fujiwara, MRN:  [id number], DOB:  April 23, 1961, LOS: 3 ADMISSION DATE:  02/23/2022, CONSULTATION DATE:  02/23/22 REFERRING MD:  Dr. Toy Cookey, CHIEF COMPLAINT:  SOB and fatigue   History of Present Illness:  61 yo M presenting to [facility] ED on 02/23/22 from home in a wheelchair with his sister for evaluation of worsening fatigue, poor appetite and SOB over the past 2 weeks. Per ED documentation, patient also complained of worsening bilateral leg swelling and pain.  Sister interviewed over the telephone providing supplemental history. She described that he lives alone, and began not feeling good for about 2 weeks with complaints of dyspnea and fatigue. He self-treated with Mucinex and took a COVID test which was negative. He started having increased congestion and poor PO intake. On the night of 8/18 the sister spoke with the patient on the phone and he told her, "I feel like I'm dying". Today on 8/19 she convinced the patient to go to the hospital to be evaluated. She reports he does not trust doctors and has difficulty following outpatient medication regimens. He was prescribed home O2, and has oxygen tanks at the house but has not used them in 2 years per his sister. He also injured his RIGHT knee a few weeks ago and had been getting around with a crutch. She reported he had been a little more confused on 8/19.  Per ED documentation, he endorsed a productive cough, but denied fever/chills. Patient admitted to not taking his Lasix when interviewed by [facility] service, per their documentation. And he has noticed increased pedal edema recently.  ED course: Upon arrival in triage the patient was profoundly hypoxic with his SpO2 was in the 60's on RA, this initially recovered to 99% on 3 L [place]. ABG revealed partially compensated hypercapnia and he was placed on BIPAP. He received Lasix for diuresis, BNP was mildly elevated. CT angio negative for pulmonary edema/effusions, negative for PE. [facility] service  consulted for admission. After a couple of hours on BIPAP, patient became combative and agitated, ripping off the BIPAP mask. Patient emergently intubated in ED. PCCM consulted. Medications given: IV contrast, Duo-nebs, lasix, fondaparinux, doxycycline, fentanyl, versed, ketamine, rocuronium & succinylcholine for intubation, solumedrol, propofol drip started Initial Vitals: 97.9, 20, 100, 117/68 & SpO2 62% on RA Significant labs: (Labs/ Imaging personally reviewed) I, Domingo Pulse [place], AGACNP-BC, personally viewed and interpreted this ECG. EKG Interpretation: Date: 02/23/22, EKG Time: 16:27, Rate: 98, Rhythm: NSR, QRS Axis:  borderline RAD, Intervals: normal, ST/T Wave abnormalities: inferior T wave inversions, Narrative Interpretation: NSR with borderline RAD and probable LAE with inferior T wave inversions Chemistry: Na+: 139, K+: 3.8, BUN/Cr.: 31/0.79, Serum CO2/ AG: 32/8 Hematology: WBC: 6.9, Hgb: 18.9, Hct: 63.3, plt: 178 BNP: 283.7, PCT: 0.22, COVID-19 & Influenza A/B: negative ABG: 7.25/ 99/ 59/ 43.4 CXR 02/23/22: pulmonary hypoinflation. Central pulmonary vascular engorgement without overt pulmonary edema. Stable tubes CT 02/23/22: no evidence of pulmonary embolism. Mild cardiomegaly, CAD, main pulmonary trunk is dilated measuring 3.9 cm suggesting pulmonary arterial hypertension. Mild bibasilar atelectasis. No evidence of pulmonary edema or pleural effusion. CT head wo contrast 02/23/22: pending  PCCM consulted due to worsening acute on chronic hypoxic/hypercapnic respiratory failure requiring emergent intubation and mechanical ventilatory support.  Pertinent  Medical History  T2DM COPD Morbid obesity Tobacco abuse (cigars, 40 pack year history) Secondary Polycythemia HFpEF  Micro Data:  8/19: SARS-CoV-2 & Influenza PCR>>negative 8/19: RVP>> Rhinovirus/Enterovirus 8/19: MRSA PCR>> negative 8/20: Tracheal aspirate>> gram + cocci, gram + rods,  gram - rods  Antimicrobials:   Ceftriaxone 8/19>> Doxycycline 8/19>>  Significant Hospital Events: Including procedures, antibiotic start and stop dates in addition to other pertinent events   02/23/22: Admit to ICU with worsening acute on chronic hypoxic/hypercapnic respiratory failure s/t suspected COPD exacerbation in the setting of HFpEF and possible pulmonary hypertension requiring emergent intubation and mechanical ventilatory support. 02/24/22: Pt remains mechanically intubated settings decreased: PEEP 5/FiO2 40%.   02/25/22: Increased vent requirements this morning due to diffuse pulmonary vascular congestion, received lasix. Tracheal aspirate with gram + cocci & rods, gram - rods.  Severe Delirium upon WUA. 02/26/22: Remains on vent, 60% fio2 & 8 PEEP.  Diurese with Diamox + Metolazone.  Interim History / Subjective:  -Overnight briefly required increase in FiO2 to 80%, has been weaned back to 60% FiO2 this morning -Afebrile, hemodynamically stable, BP soft but not requiring vasopressors -Tracheal aspirate results still pending ~ continue Rocephin and Doxy for now -CXR this morning with continued pulmonary edema, will diurese with Diamox + Metolazone given metabolic alkalosis (creatinine remains stable to this point)  Objective   Blood pressure 101/62, pulse 76, temperature 98.4 F (36.9 C), resp. rate 16, height 5' 8"  (1.727 m), weight (!) 141.6 kg, SpO2 (!) 88 %.    Vent Mode: PRVC FiO2 (%):  [50 %-80 %] 60 % Set Rate:  [16 bmp] 16 bmp Vt Set:  [500 mL] 500 mL PEEP:  [8 cmH20] 8 cmH20 Plateau Pressure:  [20 OYD74-12 cmH20] 20 cmH20   Intake/Output Summary (Last 24 hours) at 02/26/2022 0723 Last data filed at 02/26/2022 0557 Gross per 24 hour  Intake 1500.03 ml  Output 1495 ml  Net 5.03 ml    Filed Weights   02/24/22 0315 02/25/22 0354 02/26/22 0359  Weight: (!) 145 kg (!) 139.7 kg (!) 141.6 kg    Examination: General: Acutely ill appearing male ,resting in bed, NAD mechanically intubated  HEENT:  Supple, atraumatic, normocephalic Neuro: RASS: -2,  becomes delirious/agitated when sedation lightened, purposeful movent of extremities but does not follow commands, PERRL CV: s1s2 RRR, NSR on monitor, no r/m/g, trace BLE edema  Pulm: Coarse breath sounds throughout, even, non labored  GI: +BS x4, obese, soft, non distended  GU: foley in place with clear yellow urine Skin: scattered papules on chest, axillae and groin- possible heat rash Extremities: Normal bulk and tone   Resolved Hospital Problem list     Assessment & Plan:   Acute on Chronic Hypoxic / Hypercapnic Respiratory Failure suspect multifocal in the setting of suspected COPD exacerbation, decompensated HFpEF,  rhinovirus , & suspected superimposed bacterial pneumonia  Tobacco Abuse PMHx: COPD, morbid obesity, tobacco abuse, HFpEF -Full vent support, implement lung protective strategies -Plateau pressures less than 30 cm H20 -Wean FiO2 & PEEP as tolerated to maintain O2 sats 88 to 92% -Follow intermittent Chest X-ray & ABG as needed -Spontaneous Breathing Trials when respiratory parameters met and mental status permits -VAP Bundle -Bronchodilators & Pulmicort nebs -ABX as above -Diuresis as BP and renal function permits ~ give Diamox + Metolazone 8/78 given metabolic alkalosis  Shock: ? Septic vs. Sedation related ~ RESOLVED Acute on Chronic HFpEF exacerbation Mildly elevated Troponin, suspect demand ischemia Query Pulmonary HTN PMHx: HFpEF  Echocardiogram 02/24/22: LVEF 60-65%, Grade II DD -Continuous cardiac monitoring -Maintain MAP >65 -Cautious IV fluids -Vasopressors as needed to maintain MAP goal ~ currently weaned off -Trend lactic acid until normalized -Trend HS Troponin until peaked (59 ~ 71 ~ ) -Diurese as BP  and renal function permits ~ will give Diamox + Metolazone 3/26 given metabolic alkalosis  Rhinovirus Suspected superimposed bacterial Pneumonia -Monitor fever curve -Trend WBC's &  Procalcitonin -Follow cultures as above -Continue empiric Ceftriaxone & Doxycycline pending cultures & sensitivities  Polycythemia suspected secondary to chronic hypoxia Followed by oncology outpatient, last seen 10/2019. He was offered a sleep study which he declined. At that time, he was deemed not a candidate for continued follow up as Hgb had stabilized at 17.0 & HCT was 51%.  Hgb: 18.9, Hct: 63.3 - daily CBC - Spoke with on call oncology NP on 08/20: no interventions required at this time due to CTA Chest negative for PE and pts noncompliance with outpatient oxygen has likely worsened polycythemia.  Once pts oxygenation improves this will likely improve polycythemia pt can follow-up with hematology in outpatient setting.  Official hematology consult canceled   Type 2 Diabetes Mellitus Hemoglobin A1C: 6.5 - Monitor CBG Q 4 hours - SSI resistant dosing - target range while in ICU: 140-180 - follow ICU hyper/hypo-glycemia protocol  Acute Metabolic Encephalopathy Sedation needs in the setting of mechanical ventilation -Treatment of metabolic derangements as outlined above -Maintain a RASS goal of 0 to -1 -Fentanyl and Propofol to maintain RASS goal -Avoid sedating medications as able -Daily wake up assessment -CT Head negative 8/19   Best Practice (right click and "Reselect all SmartList Selections" daily)  Diet/type: NPO w/ meds via tube; tube feeds DVT prophylaxis: Arixtra GI prophylaxis: PPI Lines: N/A Foley:  Yes, and it is still needed Code Status:  full code Last date of multidisciplinary goals of care discussion [02/26/22]  Labs   CBC: Recent Labs  Lab 02/23/22 1741 02/24/22 0531 02/24/22 1007 02/25/22 0609 02/26/22 0459  WBC 6.9 7.6 9.3 7.4 5.5  NEUTROABS  --   --  7.9* 5.6  --   HGB 18.9* 18.7* 18.6* 19.1* 17.8*  HCT 63.3* 61.9* 61.8* 63.8* 60.2*  MCV 94.9 94.6 94.5 93.7 93.3  PLT 178 183 186 163 142*     Basic Metabolic Panel: Recent Labs  Lab  02/23/22 1741 02/24/22 0531 02/25/22 0609 02/26/22 0459  NA 139 142 141 141  K 3.8 4.3 3.9 3.7  CL 99 102 100 101  CO2 32 26 32 34*  GLUCOSE 142* 160* 120* 99  BUN 31* 28* 24* 24*  CREATININE 0.79 0.84 0.80 0.59*  CALCIUM 8.3* 8.6* 8.6* 8.1*  MG 2.2 2.3 2.0 2.0  PHOS 3.7 3.6 4.2 4.3    GFR: Estimated Creatinine Clearance: 137.4 mL/min (A) (by C-G formula based on SCr of 0.59 mg/dL (L)). Recent Labs  Lab 02/23/22 1741 02/24/22 0531 02/24/22 1007 02/25/22 0609 02/26/22 0459  PROCALCITON 0.22 0.18  --  <0.10  --   WBC 6.9 7.6 9.3 7.4 5.5     Liver Function Tests: No results for input(s): "AST", "ALT", "ALKPHOS", "BILITOT", "PROT", "ALBUMIN" in the last 168 hours. No results for input(s): "LIPASE", "AMYLASE" in the last 168 hours. No results for input(s): "AMMONIA" in the last 168 hours.  ABG    Component Value Date/Time   PHART 7.43 02/25/2022 0500   PCO2ART 60 (H) 02/25/2022 0500   PO2ART 69 (L) 02/25/2022 0500   HCO3 39.8 (H) 02/25/2022 0500   TCO2 39 (H) 01/13/2018 0006   O2SAT 94.7 02/25/2022 0500     Coagulation Profile: No results for input(s): "INR", "PROTIME" in the last 168 hours.  Cardiac Enzymes: No results for input(s): "CKTOTAL", "CKMB", "CKMBINDEX", "TROPONINI" in the last  168 hours.  HbA1C: Hemoglobin A1C  Date/Time Value Ref Range Status  02/13/2018 01:52 PM 7.5 (A) 4.0 - 5.6 % Final   Hgb A1c MFr Bld  Date/Time Value Ref Range Status  02/23/2022 10:05 PM 6.5 (H) 4.8 - 5.6 % Final    Comment:    (NOTE) Pre diabetes:          5.7%-6.4%  Diabetes:              >6.4%  Glycemic control for   <7.0% adults with diabetes   01/13/2018 09:53 AM 7.3 (H) 4.8 - 5.6 % Final    Comment:    (NOTE) Pre diabetes:          5.7%-6.4% Diabetes:              >6.4% Glycemic control for   <7.0% adults with diabetes     CBG: Recent Labs  Lab 02/25/22 1529 02/25/22 1924 02/25/22 2331 02/26/22 0354 02/26/22 0719  GLUCAP 94 103* 105* 110* 107*      Review of Systems:   UTA-patient intubated and sedated  Past Medical History:  He,  has a past medical history of (HFpEF) heart failure with preserved ejection fraction ([place]), Allergic reaction (12/15/2016), COPD (chronic obstructive pulmonary disease) ([place]), Hyperglycemia (12/2016), Hypertension, and Polycythemia.   Surgical History:   Past Surgical History:  Procedure Laterality Date   KNEE SURGERY Right      Social History:   reports that he has been smoking cigars. He has never used smokeless tobacco. He reports current alcohol use. He reports that he does not use drugs.   Family History:  His family history includes CAD in his brother and father.   Allergies Allergies  Allergen Reactions   Other Anaphylaxis    McCormick's Rub (Food)   Beef-Derived Products Itching    Alpha gal allergy   Pork-Derived Products Hives    Alpha gal     Home Medications  Prior to Admission medications   Medication Sig Start Date End Date Taking? Authorizing Provider  albuterol (PROVENTIL HFA;VENTOLIN HFA) 108 (90 Base) MCG/ACT inhaler Inhale 2 puffs into the lungs every 6 (six) hours as needed for wheezing or shortness of breath. 02/03/18   Azzie Glatter, FNP  aspirin 81 MG tablet Take 81 mg by mouth daily.    [provider]  blood glucose meter kit and supplies Dispense based on patient and insurance preference. Use up to four times daily as directed. (FOR ICD-10 E10.9, E11.9). 01/15/18   Eugenie Filler, MD  buPROPion (WELLBUTRIN SR) 150 MG 12 hr tablet Take 150 mg by mouth 2 (two) times daily.    [provider]  diphenhydrAMINE (BENADRYL) 25 MG tablet Take 1 tablet (25 mg total) by mouth every 6 (six) hours. Patient taking differently: Take 25 mg by mouth daily.  09/07/17   Jola Schmidt, MD  fluticasone (FLONASE) 50 MCG/ACT nasal spray Place 2 sprays into both nostrils daily. 01/16/18   Eugenie Filler, MD  folic acid (FOLVITE) 1 MG tablet Take 1 tablet (1  mg total) by mouth daily. Patient not taking: Reported on 02/03/2018 01/16/18   Eugenie Filler, MD  furosemide (LASIX) 40 MG tablet Take 1 tablet (40 mg total) by mouth daily. 02/03/18   Azzie Glatter, FNP  meloxicam (MOBIC) 15 MG tablet Take 15 mg by mouth daily.    [provider]  phentermine 15 MG capsule Take 15 mg by mouth every morning.  [provider]  traMADol (ULTRAM) 50 MG tablet Take 1 tablet (50 mg total) by mouth every 8 (eight) hours as needed. 02/13/18   Azzie Glatter, FNP  umeclidinium bromide (INCRUSE ELLIPTA) 62.5 MCG/INH AEPB Inhale 1 puff into the lungs daily. Patient not taking: Reported on 07/28/2019 02/11/18   Azzie Glatter, FNP     Critical care time: 40 minutes    Darel Hong, AGACNP-BC [facility] Prefer epic messenger for cross cover needs If after hours, please call E-link

## 2022-02-26 NOTE — Progress Notes (Addendum)
Pt examined at bedside after consent obtained from sister Bryan Mitchell.  Upon US examination, there was no fluid on L and a very small fluid pocket on R that was not large enough to safely access.  Exam was ended. No thoracentesis was performed.  Jeri Modena, NP notified of findings.  Pt sister Bryan Mitchell was notified by phone that the procedure was not performed.     Alex Gardener, AGNP-BC 02/26/2022, 3:17 PM

## 2022-02-26 NOTE — Progress Notes (Signed)
Updated pt's sister Chalmers Cater via telephone.  Discussed that pt remains on vent, support currently still too high for weaning.  Plan today is diuresis and for IR to perform thoracentesis.  All questions answered to her satisfaction, she is appreciative of update.    [name], AGACNP-BC [facility] Prefer epic messenger for cross cover needs If after hours, please call E-link

## 2022-02-27 ENCOUNTER — Inpatient Hospital Stay: Payer: Medicaid Other

## 2022-02-27 LAB — BASIC METABOLIC PANEL
Anion gap: 6 (ref 5–15)
BUN: 24 mg/dL — ABNORMAL HIGH (ref 6–20)
CO2: 32 mmol/L (ref 22–32)
Calcium: 8.5 mg/dL — ABNORMAL LOW (ref 8.9–10.3)
Chloride: 102 mmol/L (ref 98–111)
Creatinine, Ser: 0.55 mg/dL — ABNORMAL LOW (ref 0.61–1.24)
GFR, Estimated: >60 mL/min (ref >60)
Glucose, Bld: 109 mg/dL — ABNORMAL HIGH (ref 70–99)
Potassium: 3.6 mmol/L (ref 3.5–5.1)
Sodium: 140 mmol/L (ref 135–145)

## 2022-02-27 LAB — CBC
HCT: 59.2 % — ABNORMAL HIGH (ref 39.0–52.0)
Hemoglobin: 17.7 g/dL — ABNORMAL HIGH (ref 13.0–17.0)
MCH: 27.5 pg (ref 26.0–34.0)
MCHC: 29.9 g/dL — ABNORMAL LOW (ref 30.0–36.0)
MCV: 92.1 fL (ref 80.0–100.0)
Platelets: 141 10*3/uL — ABNORMAL LOW (ref 150–400)
RBC: 6.43 MIL/uL — ABNORMAL HIGH (ref 4.22–5.81)
RDW: 18.4 % — ABNORMAL HIGH (ref 11.5–15.5)
WBC: 5.6 10*3/uL (ref 4.0–10.5)
nRBC: 0 % (ref 0.0–0.2)

## 2022-02-27 LAB — MAGNESIUM: Magnesium: 2.2 mg/dL (ref 1.7–2.4)

## 2022-02-27 LAB — GLUCOSE, CAPILLARY
Glucose-Capillary: 106 mg/dL — ABNORMAL HIGH (ref 70–99)
Glucose-Capillary: 129 mg/dL — ABNORMAL HIGH (ref 70–99)
Glucose-Capillary: 113 mg/dL — ABNORMAL HIGH (ref 70–99)
Glucose-Capillary: 117 mg/dL — ABNORMAL HIGH (ref 70–99)
Glucose-Capillary: 125 mg/dL — ABNORMAL HIGH (ref 70–99)

## 2022-02-27 LAB — TROPONIN I (HIGH SENSITIVITY)
Troponin I (High Sensitivity): 143 ng/L (ref <18)
Troponin I (High Sensitivity): 221 ng/L (ref <18)

## 2022-02-27 LAB — PHOSPHORUS: Phosphorus: 3.7 mg/dL (ref 2.5–4.6)

## 2022-02-27 LAB — TRIGLYCERIDES: Triglycerides: 197 mg/dL — ABNORMAL HIGH (ref <150)

## 2022-02-27 MED ORDER — FREE WATER
100.0000 mL | Status: DC
  Administered 2022-02-27 – 2022-03-09 (×59): 100 mL

## 2022-02-27 MED ORDER — POTASSIUM CHLORIDE 20 MEQ PO PACK
40.0000 meq | PACK | Freq: Once | ORAL | Status: AC
  Administered 2022-02-27: 40 meq
  Filled 2022-02-27: qty 2

## 2022-02-27 MED ORDER — VITAL HIGH PROTEIN PO LIQD
1000.0000 mL | ORAL | Status: DC
Start: 2022-02-27 — End: 2022-03-05
  Administered 2022-02-27 – 2022-03-05 (×6): 1000 mL

## 2022-02-27 MED ORDER — ACETAZOLAMIDE 250 MG PO TABS
500.0000 mg | ORAL_TABLET | Freq: Once | ORAL | Status: AC
Start: 2022-02-27 — End: 2022-02-27
  Administered 2022-02-27: 500 mg
  Filled 2022-02-27: qty 2

## 2022-02-27 MED ORDER — METOLAZONE 5 MG PO TABS
5.0000 mg | ORAL_TABLET | Freq: Once | ORAL | Status: AC
  Administered 2022-02-27: 5 mg
  Filled 2022-02-27: qty 1

## 2022-02-27 NOTE — Plan of Care (Addendum)
  Problem: Education: Goal: Knowledge of General Education information will improve Description: Including pain rating scale, medication(s)/side effects and non-pharmacologic comfort measures 02/27/2022 1014 by Aretta Nip, RN Outcome: Not Progressing   Problem: Health Behavior/Discharge Planning: Goal: Ability to manage health-related needs will improve 02/27/2022 1014 by Aretta Nip, RN Outcome: Not Progressing  Problem: Clinical Measurements: Goal: Ability to maintain clinical measurements within normal limits will improve 02/27/2022 1014 by Aretta Nip, RN Outcome: Not Progressing  Goal: Will remain free from infection 02/27/2022 1014 by Aretta Nip, RN Outcome: Not Progressing  Goal: Diagnostic test results will improve 02/27/2022 1014 by Aretta Nip, RN Outcome: Not Progressing Goal: Respiratory complications will improve 02/27/2022 1014 by Aretta Nip, RN Outcome: Not Progressing Goal: Cardiovascular complication will be avoided 02/27/2022 1014 by Aretta Nip, RN Outcome: Not Progressing  Problem: Activity: Goal: Risk for activity intolerance will decrease 02/27/2022 1014 by Aretta Nip, RN Outcome: Not Progressing   Problem: Nutrition: Goal: Adequate nutrition will be maintained 02/27/2022 1014 by Aretta Nip, RN Outcome: Not Progressing   Problem: Coping: Goal: Level of anxiety will decrease 02/27/2022 1014 by Aretta Nip, RN Outcome: Not Progressing   Problem: Elimination: Goal: Will not experience complications related to bowel motility 02/27/2022 1014 by Aretta Nip, RN Outcome: Not Progressing Goal: Will not experience complications related to urinary retention 02/27/2022 1014 by Aretta Nip, RN Outcome: Not Progressing  Problem: Pain Managment: Goal: General experience of comfort will improve 02/27/2022 1014 by Aretta Nip, RN Outcome: Not Progressing   Problem: Safety: Goal: Ability to remain free from  injury will improve 02/27/2022 1014 by Aretta Nip, RN Outcome: Not Progressing  Problem: Skin Integrity: Goal: Risk for impaired skin integrity will decrease 02/27/2022 1014 by Aretta Nip, RN Outcome: Not Progressing   Problem: Education: Goal: Ability to describe self-care measures that may prevent or decrease complications (Diabetes Survival Skills Education) will improve 02/27/2022 1014 by Aretta Nip, RN Outcome: Not Progressing Goal: Individualized Educational Video(s) 02/27/2022 1014 by Aretta Nip, RN Outcome: Not Progressing   Problem: Coping: Goal: Ability to adjust to condition or change in health will improve 02/27/2022 1014 by Aretta Nip, RN Outcome: Not Progressing   Problem: Fluid Volume: Goal: Ability to maintain a balanced intake and output will improve 02/27/2022 1014 by Aretta Nip, RN Outcome: Not Progressing   Problem: Health Behavior/Discharge Planning: Goal: Ability to identify and utilize available resources and services will improve 02/27/2022 1014 by Aretta Nip, RN Outcome: Not Progressing  Goal: Ability to manage health-related needs will improve 02/27/2022 1014 by Aretta Nip, RN Outcome: Not Progressing  Problem: Metabolic: Goal: Ability to maintain appropriate glucose levels will improve 02/27/2022 1014 by Aretta Nip, RN Outcome: Not Progressing  Problem: Nutritional: Goal: Maintenance of adequate nutrition will improve 02/27/2022 1014 by Aretta Nip, RN Outcome: Not Progressing  Goal: Progress toward achieving an optimal weight will improve 02/27/2022 1014 by Aretta Nip, RN Outcome: Not Progressing   Problem: Skin Integrity: Goal: Risk for impaired skin integrity will decrease 02/27/2022 1014 by Aretta Nip, RN Outcome: Not Progressing  Problem: Tissue Perfusion: Goal: Adequacy of tissue perfusion will improve 02/27/2022 1014 by Aretta Nip, RN Outcome: Not Progressing

## 2022-02-27 NOTE — Consult Note (Signed)
PHARMACY CONSULT NOTE  Pharmacy Consult for Electrolyte Monitoring and Replacement   Recent Labs: Potassium (mmol/L)  Date Value  02/27/2022 3.6  04/21/2012 4.8   Magnesium (mg/dL)  Date Value  81/85/6314 2.2   Calcium (mg/dL)  Date Value  97/08/6376 8.5 (L)   Calcium, Total (mg/dL)  Date Value  58/85/0277 8.8   Albumin (g/dL)  Date Value  41/28/7867 4.1  04/21/2012 4.0   Phosphorus (mg/dL)  Date Value  67/20/9470 3.7   Sodium (mmol/L)  Date Value  02/27/2022 140  04/21/2012 139    Assessment: Pharmacy has been consulted to monitor electrolytes in 61yo male presenting to the ED complaining of worsening bilateral leg swelling and pain. He was self-treating with mucinex and took a COVID test which was negative. He is currently intubated and sedated in CCU. Pharmacy is asked to follow and replace electrolytes while in CCU  Diuretics: acetazolamide 500 mg po x 1, metolazone 5 mg po x 1  Goal of Therapy:  Electrolytes WNL  Plan:  40 mEq KCl per tube x 1 given potassium is borderline wnl and diuretics as above Will recheck electrolytes with AM labs   Lowella Bandy ,PharmD Clinical Pharmacist 02/27/2022 7:12 AM

## 2022-02-27 NOTE — Progress Notes (Signed)
Nutrition Brief Follow Up Note   Propofol change  INTERVENTION:   Vital HP @55ml /hr continuous   Propofol: 26.1 ml/hr- provides 689kcal/day   Free water flushes q4 hours   Regimen provides 2009kcal/day, 116g/day protein and 17105ml/day of free water.   Liquid MVI daily via tube   Estimated Nutritional Needs:   Kcal:  1536-1955kcal/day  Protein:  150-175g/day  Fluid:  2.1-2.4L/day  200m MS, RD, LDN Please refer to [facility] for RD and/or RD on-call/weekend/after hours pager

## 2022-02-27 NOTE — Plan of Care (Signed)

## 2022-02-27 NOTE — Progress Notes (Signed)
NAME:  Bryan Mitchell, MRN:  [id number], DOB:  01-Oct-1960, LOS: 4 ADMISSION DATE:  02/23/2022, CONSULTATION DATE:  02/23/22 REFERRING MD:  Dr. Toy Cookey, CHIEF COMPLAINT:  SOB and fatigue   History of Present Illness:  61 yo M presenting to [facility] ED on 02/23/22 from home in a wheelchair with his sister for evaluation of worsening fatigue, poor appetite and SOB over the past 2 weeks. Per ED documentation, patient also complained of worsening bilateral leg swelling and pain.  Sister interviewed over the telephone providing supplemental history. She described that he lives alone, and began not feeling good for about 2 weeks with complaints of dyspnea and fatigue. He self-treated with Mucinex and took a COVID test which was negative. He started having increased congestion and poor PO intake. On the night of 8/18 the sister spoke with the patient on the phone and he told her, "I feel like I'm dying". Today on 8/19 she convinced the patient to go to the hospital to be evaluated. She reports he does not trust doctors and has difficulty following outpatient medication regimens. He was prescribed home O2, and has oxygen tanks at the house but has not used them in 2 years per his sister. He also injured his RIGHT knee a few weeks ago and had been getting around with a crutch. She reported he had been a little more confused on 8/19.  Per ED documentation, he endorsed a productive cough, but denied fever/chills. Patient admitted to not taking his Lasix when interviewed by [facility] service, per their documentation. And he has noticed increased pedal edema recently.  ED course: Upon arrival in triage the patient was profoundly hypoxic with his SpO2 was in the 60's on RA, this initially recovered to 99% on 3 L [place]. ABG revealed partially compensated hypercapnia and he was placed on BIPAP. He received Lasix for diuresis, BNP was mildly elevated. CT angio negative for pulmonary edema/effusions, negative for PE. [facility] service  consulted for admission. After a couple of hours on BIPAP, patient became combative and agitated, ripping off the BIPAP mask. Patient emergently intubated in ED. PCCM consulted. Medications given: IV contrast, Duo-nebs, lasix, fondaparinux, doxycycline, fentanyl, versed, ketamine, rocuronium & succinylcholine for intubation, solumedrol, propofol drip started Initial Vitals: 97.9, 20, 100, 117/68 & SpO2 62% on RA Significant labs: (Labs/ Imaging personally reviewed) I, Domingo Pulse [place], AGACNP-BC, personally viewed and interpreted this ECG. EKG Interpretation: Date: 02/23/22, EKG Time: 16:27, Rate: 98, Rhythm: NSR, QRS Axis:  borderline RAD, Intervals: normal, ST/T Wave abnormalities: inferior T wave inversions, Narrative Interpretation: NSR with borderline RAD and probable LAE with inferior T wave inversions Chemistry: Na+: 139, K+: 3.8, BUN/Cr.: 31/0.79, Serum CO2/ AG: 32/8 Hematology: WBC: 6.9, Hgb: 18.9, Hct: 63.3, plt: 178 BNP: 283.7, PCT: 0.22, COVID-19 & Influenza A/B: negative ABG: 7.25/ 99/ 59/ 43.4 CXR 02/23/22: pulmonary hypoinflation. Central pulmonary vascular engorgement without overt pulmonary edema. Stable tubes CT 02/23/22: no evidence of pulmonary embolism. Mild cardiomegaly, CAD, main pulmonary trunk is dilated measuring 3.9 cm suggesting pulmonary arterial hypertension. Mild bibasilar atelectasis. No evidence of pulmonary edema or pleural effusion. CT head wo contrast 02/23/22: pending  PCCM consulted due to worsening acute on chronic hypoxic/hypercapnic respiratory failure requiring emergent intubation and mechanical ventilatory support.  Pertinent  Medical History  T2DM COPD Morbid obesity Tobacco abuse (cigars, 40 pack year history) Secondary Polycythemia HFpEF  Micro Data:  8/19: SARS-CoV-2 & Influenza PCR>>negative 8/19: RVP>> Rhinovirus/Enterovirus 8/19: MRSA PCR>> negative 8/20: Tracheal aspirate>> gram + cocci, gram + rods,  gram - rods  Antimicrobials:   Ceftriaxone 8/19>> Doxycycline 8/19>>  Significant Hospital Events: Including procedures, antibiotic start and stop dates in addition to other pertinent events   02/23/22: Admit to ICU with worsening acute on chronic hypoxic/hypercapnic respiratory failure s/t suspected COPD exacerbation in the setting of HFpEF and possible pulmonary hypertension requiring emergent intubation and mechanical ventilatory support. 02/24/22: Pt remains mechanically intubated settings decreased: PEEP 5/FiO2 40%.   02/25/22: Increased vent requirements this morning due to diffuse pulmonary vascular congestion, received lasix. Tracheal aspirate with gram + cocci & rods, gram - rods.  Severe Delirium upon WUA. 02/26/22: Remains on vent, 60% fio2 & 8 PEEP.  Diurese with Diamox + Metolazone. 02/27/22: No acute events overnight; remains mechanically intubated current vent settings: 40%/8 PEEP   Interim History / Subjective:  As outlined above   Objective   Blood pressure (!) 102/59, pulse 87, temperature 99 F (37.2 C), resp. rate 16, height $RemoveBe'5\' 8"'wRYsKWwkb$  (1.727 m), weight (!) 142.9 kg, SpO2 98 %.    Vent Mode: PRVC FiO2 (%):  [50 %-60 %] 50 % Set Rate:  [16 bmp] 16 bmp Vt Set:  [500 mL-550 mL] 550 mL PEEP:  [8 cmH20] 8 cmH20 Plateau Pressure:  [19 cmH20-22 cmH20] 19 cmH20   Intake/Output Summary (Last 24 hours) at 02/27/2022 0824 Last data filed at 02/27/2022 5631 Gross per 24 hour  Intake 2225.29 ml  Output 1900 ml  Net 325.29 ml   Filed Weights   02/25/22 0354 02/26/22 0359 02/27/22 0422  Weight: (!) 139.7 kg (!) 141.6 kg (!) 142.9 kg    Examination: General: Acutely on chronically-ill appearing male resting in bed, NAD mechanically intubated  HEENT: Supple, no JVD  Neuro: RASS: -3, unable to follow commands, PERRL CV: NSR, rrr, no r/g, 2+ radial/1+ distal pulses, 1+ bilateral lower extremity edema  Pulm: Faint rhonchi throughout, even, non labored  GI: +BS x4, obese, soft, non distended  GU: foley in place  with clear yellow urine Skin: Scattered papules on chest, axillae and groin- possible heat rash Extremities: Normal bulk and tone  Resolved Hospital Problem list   Hypotension secondary to possible sepsis vs sedation related   Assessment & Plan:   Acute on Chronic Hypoxic / Hypercapnic Respiratory Failure suspect multifocal in the setting of suspected COPD exacerbation, decompensated HFpEF,  rhinovirus , & suspected superimposed bacterial pneumonia  Tobacco Abuse PMHx: COPD, morbid obesity, tobacco abuse, HFpEF - Full vent support, implement lung protective strategies - Plateau pressures less than 30 cm H20 - Wean FiO2 & PEEP as tolerated to maintain O2 sats 88 to 92% - Follow intermittent Chest X-ray & ABG as needed - Spontaneous Breathing Trials when respiratory parameters met and mental status permits - VAP Bundle - Bronchodilators & Pulmicort nebs - ABX as above  Acute on Chronic HFpEF exacerbation Mildly elevated Troponin, suspect demand ischemia Query Pulmonary HTN PMHx: HFpEF  Echocardiogram 02/24/22: LVEF 60-65%, Grade II DD - Continuous  telemetry monitoring - Maintain MAP >65 - Vasopressors as needed to maintain MAP goal - Will recheck troponin to ensure trending down  - Diurese as BP and renal function permits to keep net negative   Rhinovirus Suspected superimposed bacterial Pneumonia - Trend WBC and monitor fever curve  - Trend PCT  - Follow cultures as above - Continue empiric Ceftriaxone & Doxycycline pending cultures & sensitivities  Polycythemia suspected secondary to chronic hypoxia Followed by oncology outpatient, last seen 10/2019. He was offered a sleep study which he declined. At  that time, he was deemed not a candidate for continued follow up as Hgb had stabilized at 17.0 & HCT was 51%.  Hgb: 18.9, Hct: 63.3 - Daily CBC - Spoke with on call oncology NP on 08/20: no interventions required at this time due to CTA Chest negative for PE and pts  noncompliance with outpatient oxygen has likely worsened polycythemia.  Once pts oxygenation improves this will likely improve polycythemia pt can follow-up with hematology in outpatient setting.  Official hematology consult canceled   Type 2 Diabetes Mellitus Hemoglobin A1C: 6.5 - Monitor CBG Q 4 hours - SSI resistant dosing - Target range while in ICU: 140-180 - Follow ICU hyper/hypo-glycemia protocol  Acute Metabolic Encephalopathy Sedation needs in the setting of mechanical ventilation  CT Head negative 8/19 - Treatment of metabolic derangements as outlined above - Maintain a RASS goal of 0 to -1 - Fentanyl and Propofol to maintain RASS goal - Avoid sedating medications as able - Daily wake up assessment  Best Practice (right click and "Reselect all SmartList Selections" daily)  Diet/type: NPO w/ meds via tube; tube feeds DVT prophylaxis: Arixtra GI prophylaxis: PPI Lines: N/A Foley:  Yes, and it is still needed Code Status:  full code Last date of multidisciplinary goals of care discussion [02/27/22]  Labs   CBC: Recent Labs  Lab 02/24/22 0531 02/24/22 1007 02/25/22 0609 02/26/22 0459 02/27/22 0449  WBC 7.6 9.3 7.4 5.5 5.6  NEUTROABS  --  7.9* 5.6  --   --   HGB 18.7* 18.6* 19.1* 17.8* 17.7*  HCT 61.9* 61.8* 63.8* 60.2* 59.2*  MCV 94.6 94.5 93.7 93.3 92.1  PLT 183 186 163 142* 141*    Basic Metabolic Panel: Recent Labs  Lab 02/23/22 1741 02/24/22 0531 02/25/22 0609 02/26/22 0459 02/27/22 0449  NA 139 142 141 141 140  K 3.8 4.3 3.9 3.7 3.6  CL 99 102 100 101 102  CO2 32 26 32 34* 32  GLUCOSE 142* 160* 120* 99 109*  BUN 31* 28* 24* 24* 24*  CREATININE 0.79 0.84 0.80 0.59* 0.55*  CALCIUM 8.3* 8.6* 8.6* 8.1* 8.5*  MG 2.2 2.3 2.0 2.0 2.2  PHOS 3.7 3.6 4.2 4.3 3.7   GFR: Estimated Creatinine Clearance: 138.1 mL/min (A) (by C-G formula based on SCr of 0.55 mg/dL (L)). Recent Labs  Lab 02/23/22 1741 02/24/22 0531 02/24/22 1007 02/25/22 0609  02/26/22 0459 02/27/22 0449  PROCALCITON 0.22 0.18  --  <0.10  --   --   WBC 6.9 7.6 9.3 7.4 5.5 5.6    Liver Function Tests: No results for input(s): "AST", "ALT", "ALKPHOS", "BILITOT", "PROT", "ALBUMIN" in the last 168 hours. No results for input(s): "LIPASE", "AMYLASE" in the last 168 hours. No results for input(s): "AMMONIA" in the last 168 hours.  ABG    Component Value Date/Time   PHART 7.43 02/25/2022 0500   PCO2ART 60 (H) 02/25/2022 0500   PO2ART 69 (L) 02/25/2022 0500   HCO3 39.8 (H) 02/25/2022 0500   TCO2 39 (H) 01/13/2018 0006   O2SAT 94.7 02/25/2022 0500     Coagulation Profile: No results for input(s): "INR", "PROTIME" in the last 168 hours.  Cardiac Enzymes: No results for input(s): "CKTOTAL", "CKMB", "CKMBINDEX", "TROPONINI" in the last 168 hours.  HbA1C: Hemoglobin A1C  Date/Time Value Ref Range Status  02/13/2018 01:52 PM 7.5 (A) 4.0 - 5.6 % Final   Hgb A1c MFr Bld  Date/Time Value Ref Range Status  02/23/2022 10:05 PM 6.5 (H) 4.8 - 5.6 %  Final    Comment:    (NOTE) Pre diabetes:          5.7%-6.4%  Diabetes:              >6.4%  Glycemic control for   <7.0% adults with diabetes   01/13/2018 09:53 AM 7.3 (H) 4.8 - 5.6 % Final    Comment:    (NOTE) Pre diabetes:          5.7%-6.4% Diabetes:              >6.4% Glycemic control for   <7.0% adults with diabetes     CBG: Recent Labs  Lab 02/26/22 1636 02/26/22 1914 02/26/22 2328 02/27/22 0314 02/27/22 0733  GLUCAP 106* 97 109* 106* 129*    Review of Systems:   UTA-patient intubated and sedated  Past Medical History:  He,  has a past medical history of (HFpEF) heart failure with preserved ejection fraction ([place]), Allergic reaction (12/15/2016), COPD (chronic obstructive pulmonary disease) ([place]), Hyperglycemia (12/2016), Hypertension, and Polycythemia.   Surgical History:   Past Surgical History:  Procedure Laterality Date   KNEE SURGERY Right      Social History:   reports  that he has been smoking cigars. He has never used smokeless tobacco. He reports current alcohol use. He reports that he does not use drugs.   Family History:  His family history includes CAD in his brother and father.   Allergies Allergies  Allergen Reactions   Other Anaphylaxis    McCormick's Rub (Food)   Beef-Derived Products Itching    Alpha gal allergy   Pork-Derived Products Hives    Alpha gal     Home Medications  Prior to Admission medications   Medication Sig Start Date End Date Taking? Authorizing Provider  albuterol (PROVENTIL HFA;VENTOLIN HFA) 108 (90 Base) MCG/ACT inhaler Inhale 2 puffs into the lungs every 6 (six) hours as needed for wheezing or shortness of breath. 02/03/18   Azzie Glatter, FNP  aspirin 81 MG tablet Take 81 mg by mouth daily.    [provider]  blood glucose meter kit and supplies Dispense based on patient and insurance preference. Use up to four times daily as directed. (FOR ICD-10 E10.9, E11.9). 01/15/18   Eugenie Filler, MD  buPROPion (WELLBUTRIN SR) 150 MG 12 hr tablet Take 150 mg by mouth 2 (two) times daily.    [provider]  diphenhydrAMINE (BENADRYL) 25 MG tablet Take 1 tablet (25 mg total) by mouth every 6 (six) hours. Patient taking differently: Take 25 mg by mouth daily.  09/07/17   Jola Schmidt, MD  fluticasone (FLONASE) 50 MCG/ACT nasal spray Place 2 sprays into both nostrils daily. 01/16/18   Eugenie Filler, MD  folic acid (FOLVITE) 1 MG tablet Take 1 tablet (1 mg total) by mouth daily. Patient not taking: Reported on 02/03/2018 01/16/18   Eugenie Filler, MD  furosemide (LASIX) 40 MG tablet Take 1 tablet (40 mg total) by mouth daily. 02/03/18   Azzie Glatter, FNP  meloxicam (MOBIC) 15 MG tablet Take 15 mg by mouth daily.    [provider]  phentermine 15 MG capsule Take 15 mg by mouth every morning.    [provider]  traMADol (ULTRAM) 50 MG tablet Take 1 tablet (50 mg total) by mouth  every 8 (eight) hours as needed. 02/13/18   Azzie Glatter, FNP  umeclidinium bromide (INCRUSE ELLIPTA) 62.5 MCG/INH AEPB Inhale 1 puff into the lungs daily. Patient  not taking: Reported on 07/28/2019 02/11/18   Azzie Glatter, FNP     Critical care time: 34 minutes    Donell Beers, [place] Pager [phone] (please enter 7 digits) PCCM Consult Pager [phone] (please enter 7 digits)

## 2022-02-28 ENCOUNTER — Inpatient Hospital Stay: Payer: Medicaid Other

## 2022-02-28 DIAGNOSIS — J9602 Acute respiratory failure with hypercapnia: Secondary | ICD-10-CM

## 2022-02-28 DIAGNOSIS — Z720 Tobacco use: Secondary | ICD-10-CM

## 2022-02-28 DIAGNOSIS — Z515 Encounter for palliative care: Secondary | ICD-10-CM

## 2022-02-28 DIAGNOSIS — J441 Chronic obstructive pulmonary disease with (acute) exacerbation: Secondary | ICD-10-CM

## 2022-02-28 DIAGNOSIS — E118 Type 2 diabetes mellitus with unspecified complications: Secondary | ICD-10-CM

## 2022-02-28 LAB — BLOOD GAS, ARTERIAL
Acid-Base Excess: 8.2 mmol/L — ABNORMAL HIGH (ref 0.0–2.0)
Bicarbonate: 35.8 mmol/L — ABNORMAL HIGH (ref 20.0–28.0)
FIO2: 50 %
MECHVT: 550 mL
Mechanical Rate: 16
O2 Saturation: 92.3 %
PEEP: 8 cmH2O
Patient temperature: 37
pCO2 arterial: 62 mmHg — ABNORMAL HIGH (ref 32–48)
pH, Arterial: 7.37 (ref 7.35–7.45)
pO2, Arterial: 63 mmHg — ABNORMAL LOW (ref 83–108)

## 2022-02-28 LAB — BASIC METABOLIC PANEL
Anion gap: 7 (ref 5–15)
BUN: 22 mg/dL — ABNORMAL HIGH (ref 6–20)
CO2: 30 mmol/L (ref 22–32)
Calcium: 8.3 mg/dL — ABNORMAL LOW (ref 8.9–10.3)
Chloride: 101 mmol/L (ref 98–111)
Creatinine, Ser: 0.53 mg/dL — ABNORMAL LOW (ref 0.61–1.24)
GFR, Estimated: >60 mL/min (ref >60)
Glucose, Bld: 108 mg/dL — ABNORMAL HIGH (ref 70–99)
Potassium: 3.6 mmol/L (ref 3.5–5.1)
Sodium: 138 mmol/L (ref 135–145)

## 2022-02-28 LAB — PHOSPHORUS: Phosphorus: 3.8 mg/dL (ref 2.5–4.6)

## 2022-02-28 LAB — CBC
HCT: 58.6 % — ABNORMAL HIGH (ref 39.0–52.0)
Hemoglobin: 17.6 g/dL — ABNORMAL HIGH (ref 13.0–17.0)
MCH: 27.6 pg (ref 26.0–34.0)
MCHC: 30 g/dL (ref 30.0–36.0)
MCV: 91.8 fL (ref 80.0–100.0)
Platelets: 154 10*3/uL (ref 150–400)
RBC: 6.38 MIL/uL — ABNORMAL HIGH (ref 4.22–5.81)
RDW: 17.9 % — ABNORMAL HIGH (ref 11.5–15.5)
WBC: 5.4 10*3/uL (ref 4.0–10.5)
nRBC: 0 % (ref 0.0–0.2)

## 2022-02-28 LAB — GLUCOSE, CAPILLARY
Glucose-Capillary: 121 mg/dL — ABNORMAL HIGH (ref 70–99)
Glucose-Capillary: 124 mg/dL — ABNORMAL HIGH (ref 70–99)
Glucose-Capillary: 127 mg/dL — ABNORMAL HIGH (ref 70–99)
Glucose-Capillary: 129 mg/dL — ABNORMAL HIGH (ref 70–99)
Glucose-Capillary: 137 mg/dL — ABNORMAL HIGH (ref 70–99)
Glucose-Capillary: 138 mg/dL — ABNORMAL HIGH (ref 70–99)
Glucose-Capillary: 147 mg/dL — ABNORMAL HIGH (ref 70–99)

## 2022-02-28 LAB — BRAIN NATRIURETIC PEPTIDE: B Natriuretic Peptide: 63.7 pg/mL (ref 0.0–100.0)

## 2022-02-28 LAB — MAGNESIUM: Magnesium: 2.1 mg/dL (ref 1.7–2.4)

## 2022-02-28 MED ORDER — INSULIN ASPART 100 UNIT/ML IJ SOLN
0.0000 [IU] | INTRAMUSCULAR | Status: DC
  Administered 2022-02-28 (×2): 3 [IU] via SUBCUTANEOUS
  Administered 2022-03-01: 4 [IU] via SUBCUTANEOUS
  Administered 2022-03-01: 3 [IU] via SUBCUTANEOUS
  Administered 2022-03-01 (×2): 4 [IU] via SUBCUTANEOUS
  Administered 2022-03-01: 3 [IU] via SUBCUTANEOUS
  Administered 2022-03-01 – 2022-03-03 (×6): 4 [IU] via SUBCUTANEOUS
  Administered 2022-03-03: 3 [IU] via SUBCUTANEOUS
  Administered 2022-03-03: 4 [IU] via SUBCUTANEOUS
  Administered 2022-03-03: 3 [IU] via SUBCUTANEOUS
  Administered 2022-03-03: 4 [IU] via SUBCUTANEOUS
  Administered 2022-03-03: 7 [IU] via SUBCUTANEOUS
  Administered 2022-03-03: 3 [IU] via SUBCUTANEOUS
  Administered 2022-03-04 (×2): 4 [IU] via SUBCUTANEOUS
  Administered 2022-03-04: 7 [IU] via SUBCUTANEOUS
  Administered 2022-03-04: 4 [IU] via SUBCUTANEOUS
  Administered 2022-03-04: 11 [IU] via SUBCUTANEOUS
  Administered 2022-03-04: 4 [IU] via SUBCUTANEOUS
  Administered 2022-03-05: 7 [IU] via SUBCUTANEOUS
  Administered 2022-03-05: 4 [IU] via SUBCUTANEOUS
  Administered 2022-03-05: 7 [IU] via SUBCUTANEOUS
  Administered 2022-03-05: 4 [IU] via SUBCUTANEOUS
  Administered 2022-03-05: 3 [IU] via SUBCUTANEOUS
  Administered 2022-03-06 (×2): 4 [IU] via SUBCUTANEOUS
  Administered 2022-03-06: 11 [IU] via SUBCUTANEOUS
  Administered 2022-03-06: 4 [IU] via SUBCUTANEOUS
  Administered 2022-03-06: 3 [IU] via SUBCUTANEOUS
  Administered 2022-03-07: 7 [IU] via SUBCUTANEOUS
  Administered 2022-03-07: 4 [IU] via SUBCUTANEOUS
  Administered 2022-03-07: 7 [IU] via SUBCUTANEOUS
  Administered 2022-03-07: 11 [IU] via SUBCUTANEOUS
  Administered 2022-03-07 (×2): 4 [IU] via SUBCUTANEOUS
  Administered 2022-03-08: 11 [IU] via SUBCUTANEOUS
  Administered 2022-03-08: 4 [IU] via SUBCUTANEOUS
  Administered 2022-03-08: 7 [IU] via SUBCUTANEOUS
  Administered 2022-03-08: 4 [IU] via SUBCUTANEOUS
  Administered 2022-03-08: 11 [IU] via SUBCUTANEOUS
  Administered 2022-03-08 – 2022-03-09 (×2): 4 [IU] via SUBCUTANEOUS
  Administered 2022-03-09: 7 [IU] via SUBCUTANEOUS
  Administered 2022-03-09: 4 [IU] via SUBCUTANEOUS
  Administered 2022-03-09: 11 [IU] via SUBCUTANEOUS
  Administered 2022-03-09: 3 [IU] via SUBCUTANEOUS
  Administered 2022-03-09 – 2022-03-10 (×3): 7 [IU] via SUBCUTANEOUS
  Administered 2022-03-10 (×2): 4 [IU] via SUBCUTANEOUS
  Administered 2022-03-10: 7 [IU] via SUBCUTANEOUS
  Administered 2022-03-10: 4 [IU] via SUBCUTANEOUS
  Administered 2022-03-11: 7 [IU] via SUBCUTANEOUS
  Administered 2022-03-11: 4 [IU] via SUBCUTANEOUS
  Administered 2022-03-11: 7 [IU] via SUBCUTANEOUS
  Administered 2022-03-11 – 2022-03-12 (×3): 4 [IU] via SUBCUTANEOUS
  Administered 2022-03-12 (×3): 3 [IU] via SUBCUTANEOUS
  Administered 2022-03-12 (×3): 4 [IU] via SUBCUTANEOUS
  Administered 2022-03-13 (×4): 3 [IU] via SUBCUTANEOUS
  Administered 2022-03-14: 4 [IU] via SUBCUTANEOUS
  Administered 2022-03-14: 7 [IU] via SUBCUTANEOUS
  Administered 2022-03-14: 4 [IU] via SUBCUTANEOUS
  Administered 2022-03-14 – 2022-03-15 (×4): 3 [IU] via SUBCUTANEOUS
  Administered 2022-03-15 (×2): 7 [IU] via SUBCUTANEOUS
  Administered 2022-03-15: 4 [IU] via SUBCUTANEOUS
  Administered 2022-03-16 – 2022-03-17 (×5): 3 [IU] via SUBCUTANEOUS
  Administered 2022-03-17: 2 [IU] via SUBCUTANEOUS
  Administered 2022-03-17: 3 [IU] via SUBCUTANEOUS
  Administered 2022-03-17: 4 [IU] via SUBCUTANEOUS
  Administered 2022-03-17: 3 [IU] via SUBCUTANEOUS
  Administered 2022-03-18: 4 [IU] via SUBCUTANEOUS
  Administered 2022-03-18 (×2): 3 [IU] via SUBCUTANEOUS
  Filled 2022-02-28 (×93): qty 1

## 2022-02-28 MED ORDER — ACETAZOLAMIDE 250 MG PO TABS
500.0000 mg | ORAL_TABLET | Freq: Once | ORAL | Status: AC
Start: 2022-02-28 — End: 2022-02-28
  Administered 2022-02-28: 500 mg
  Filled 2022-02-28: qty 2

## 2022-02-28 MED ORDER — METOLAZONE 5 MG PO TABS
5.0000 mg | ORAL_TABLET | Freq: Once | ORAL | Status: AC
  Administered 2022-02-28: 5 mg
  Filled 2022-02-28: qty 1

## 2022-02-28 MED ORDER — POTASSIUM CHLORIDE 20 MEQ PO PACK
40.0000 meq | PACK | Freq: Once | ORAL | Status: AC
  Administered 2022-02-28: 40 meq
  Filled 2022-02-28: qty 2

## 2022-02-28 NOTE — Consult Note (Signed)
PHARMACY CONSULT NOTE  Pharmacy Consult for Electrolyte Monitoring and Replacement   Recent Labs: Potassium (mmol/L)  Date Value  02/28/2022 3.6  04/21/2012 4.8   Magnesium (mg/dL)  Date Value  27/09/5007 2.1   Calcium (mg/dL)  Date Value  38/18/2993 8.3 (L)   Calcium, Total (mg/dL)  Date Value  71/69/6789 8.8   Albumin (g/dL)  Date Value  38/04/1750 4.1  04/21/2012 4.0   Phosphorus (mg/dL)  Date Value  02/58/5277 3.8   Sodium (mmol/L)  Date Value  02/28/2022 138  04/21/2012 139    Assessment: Pharmacy has been consulted to monitor electrolytes in 60yo male presenting to the ED complaining of worsening bilateral leg swelling and pain. He was self-treating with mucinex and took a COVID test which was negative. He is currently intubated and sedated in CCU. Pharmacy is asked to follow and replace electrolytes while in CCU  Diuretics: acetazolamide 500 mg po x 1, metolazone 5 mg po x 1 (3rd consecutive daily administration)  Goal of Therapy:  Electrolytes WNL  Plan:  40 mEq KCl per tube x 1 given potassium is borderline wnl and diuretics as above Will recheck electrolytes with AM labs   Lowella Bandy ,PharmD Clinical Pharmacist 02/28/2022 7:01 AM

## 2022-02-28 NOTE — Progress Notes (Signed)
NAME:  Bryan Mitchell, MRN:  [id number], DOB:  11-10-1960, LOS: 5 ADMISSION DATE:  02/23/2022, CONSULTATION DATE:  02/23/22 REFERRING MD:  Dr. Toy Cookey, CHIEF COMPLAINT:  SOB and fatigue   History of Present Illness:  61 yo M presenting to [facility] ED on 02/23/22 from home in a wheelchair with his sister for evaluation of worsening fatigue, poor appetite and SOB over the past 2 weeks. Per ED documentation, patient also complained of worsening bilateral leg swelling and pain.  Sister interviewed over the telephone providing supplemental history. She described that he lives alone, and began not feeling good for about 2 weeks with complaints of dyspnea and fatigue. He self-treated with Mucinex and took a COVID test which was negative. He started having increased congestion and poor PO intake. On the night of 8/18 the sister spoke with the patient on the phone and he told her, "I feel like I'm dying". Today on 8/19 she convinced the patient to go to the hospital to be evaluated. She reports he does not trust doctors and has difficulty following outpatient medication regimens. He was prescribed home O2, and has oxygen tanks at the house but has not used them in 2 years per his sister. He also injured his RIGHT knee a few weeks ago and had been getting around with a crutch. She reported he had been a little more confused on 8/19.  Per ED documentation, he endorsed a productive cough, but denied fever/chills. Patient admitted to not taking his Lasix when interviewed by [facility] service, per their documentation. And he has noticed increased pedal edema recently.  ED course: Upon arrival in triage the patient was profoundly hypoxic with his SpO2 was in the 60's on RA, this initially recovered to 99% on 3 L [place]. ABG revealed partially compensated hypercapnia and he was placed on BIPAP. He received Lasix for diuresis, BNP was mildly elevated. CT angio negative for pulmonary edema/effusions, negative for PE. [facility] service  consulted for admission. After a couple of hours on BIPAP, patient became combative and agitated, ripping off the BIPAP mask. Patient emergently intubated in ED. PCCM consulted. Medications given: IV contrast, Duo-nebs, lasix, fondaparinux, doxycycline, fentanyl, versed, ketamine, rocuronium & succinylcholine for intubation, solumedrol, propofol drip started Initial Vitals: 97.9, 20, 100, 117/68 & SpO2 62% on RA Significant labs: (Labs/ Imaging personally reviewed) I, Domingo Pulse [place], AGACNP-BC, personally viewed and interpreted this ECG. EKG Interpretation: Date: 02/23/22, EKG Time: 16:27, Rate: 98, Rhythm: NSR, QRS Axis:  borderline RAD, Intervals: normal, ST/T Wave abnormalities: inferior T wave inversions, Narrative Interpretation: NSR with borderline RAD and probable LAE with inferior T wave inversions Chemistry: Na+: 139, K+: 3.8, BUN/Cr.: 31/0.79, Serum CO2/ AG: 32/8 Hematology: WBC: 6.9, Hgb: 18.9, Hct: 63.3, plt: 178 BNP: 283.7, PCT: 0.22, COVID-19 & Influenza A/B: negative ABG: 7.25/ 99/ 59/ 43.4 CXR 02/23/22: pulmonary hypoinflation. Central pulmonary vascular engorgement without overt pulmonary edema. Stable tubes CT 02/23/22: no evidence of pulmonary embolism. Mild cardiomegaly, CAD, main pulmonary trunk is dilated measuring 3.9 cm suggesting pulmonary arterial hypertension. Mild bibasilar atelectasis. No evidence of pulmonary edema or pleural effusion. CT head wo contrast 02/23/22: pending  PCCM consulted due to worsening acute on chronic hypoxic/hypercapnic respiratory failure requiring emergent intubation and mechanical ventilatory support.  Pertinent  Medical History  T2DM COPD Morbid obesity Tobacco abuse (cigars, 40 pack year history) Secondary Polycythemia HFpEF  Micro Data:  8/19: SARS-CoV-2 & Influenza PCR>>negative 8/19: RVP>> Rhinovirus/Enterovirus 8/19: MRSA PCR>> negative 8/20: Tracheal aspirate>>negative   Antimicrobials:  Ceftriaxone  8/19>>8/23 Doxycycline 8/19>>8/23  Significant Hospital Events: Including procedures, antibiotic start and stop dates in addition to other pertinent events   02/23/22: Admit to ICU with worsening acute on chronic hypoxic/hypercapnic respiratory failure s/t suspected COPD exacerbation in the setting of HFpEF and possible pulmonary hypertension requiring emergent intubation and mechanical ventilatory support. 02/24/22: Pt remains mechanically intubated settings decreased: PEEP 5/FiO2 40%.   02/25/22: Increased vent requirements this morning due to diffuse pulmonary vascular congestion, received lasix. Tracheal aspirate with gram + cocci & rods, gram - rods.  Severe Delirium upon WUA. 02/26/22: Remains on vent, 60% fio2 & 8 PEEP.  Diurese with Diamox + Metolazone. 02/27/22: No acute events overnight; remains mechanically intubated current vent settings: 40%/8 PEEP  02/28/22: Pts O2 requirements increased overnight due to hypoxia vent settings: FiO2 50%/PEEP 8 unable to perform SBT   Interim History / Subjective:  As outlined above   Objective   Blood pressure (!) 96/58, pulse 80, temperature 98.2 F (36.8 C), temperature source Esophageal, resp. rate 16, height 5' 8"  (1.727 m), weight (!) 144.2 kg, SpO2 92 %.    Vent Mode: PRVC FiO2 (%):  [40 %-50 %] 50 % Set Rate:  [16 bmp] 16 bmp Vt Set:  [550 mL] 550 mL PEEP:  [8 cmH20] 8 cmH20 Plateau Pressure:  [12 JTT01-77 cmH20] 18 cmH20   Intake/Output Summary (Last 24 hours) at 02/28/2022 0810 Last data filed at 02/28/2022 0700 Gross per 24 hour  Intake 2781.08 ml  Output 1950 ml  Net 831.08 ml   Filed Weights   02/26/22 0359 02/27/22 0422 02/28/22 0431  Weight: (!) 141.6 kg (!) 142.9 kg (!) 144.2 kg    Examination: General: Acutely on chronically-ill appearing male resting in bed, NAD mechanically intubated  HEENT: Supple, no JVD  Neuro: RASS: -3, unable to follow commands, PERRL CV: NSR, rrr, no r/g, 2+ radial/1+ distal pulses, 1+ bilateral  lower extremity edema  Pulm: Faint rhonchi throughout, even, non labored  GI: +BS x4, obese, soft, non distended  GU: Foley in place with clear yellow urine Skin: Scattered papules on chest, axillae and groin- possible heat rash Extremities: Normal bulk and tone  Resolved Hospital Problem list   Hypotension secondary to possible sepsis vs sedation related   Assessment & Plan:   Acute on Chronic Hypoxic / Hypercapnic Respiratory Failure suspect multifocal in the setting of suspected COPD exacerbation and decompensated HFpEF and rhinovirus Tobacco Abuse PMHx: COPD, morbid obesity, tobacco abuse, HFpEF - Full vent support, implement lung protective strategies - Plateau pressures less than 30 cm H20 - Wean FiO2 & PEEP as tolerated to maintain O2 sats 88 to 92% - Follow intermittent Chest X-ray & ABG as needed - Spontaneous Breathing Trials when respiratory parameters met and mental status permits - VAP Bundle - Bronchodilators & Pulmicort nebs  Acute on Chronic HFpEF exacerbation Mildly elevated Troponin, suspect demand ischemia Query Pulmonary HTN PMHx: HFpEF  Echocardiogram 02/24/22: LVEF 60-65%, Grade II DD - Continuous  telemetry monitoring - Maintain MAP >65 - Vasopressors as needed to maintain MAP goal - Will recheck troponin to ensure trending down  - Diurese as BP and renal function permits to keep net negative: will give a dose of diamox and metolazone 08/24   Rhinovirus - Trend WBC and monitor fever curve  - Completed course of abx therapy due to suspected bacterial pneumonia, however tracheal aspirate negative for staph aureus and pseudomonas   Polycythemia suspected secondary to chronic hypoxia ~improving  Followed by oncology outpatient, last  seen 10/2019. He was offered a sleep study which he declined. At that time, he was deemed not a candidate for continued follow up as Hgb had stabilized at 17.0 & HCT was 51%.  Hgb: 18.9, Hct: 63.3 - Daily CBC - Spoke with on  call oncology NP on 08/20: no interventions required at this time due to CTA Chest negative for PE and pts noncompliance with outpatient oxygen has likely worsened polycythemia.  Once pts oxygenation improves this will likely improve polycythemia pt can follow-up with hematology in outpatient setting.  Official hematology consult canceled   Type 2 Diabetes Mellitus Hemoglobin A1C: 6.5 - Monitor CBG Q 4 hours - SSI resistant dosing - Target range while in ICU: 140-180 - Follow ICU hyper/hypo-glycemia protocol  Acute Metabolic Encephalopathy Sedation needs in the setting of mechanical ventilation  CT Head negative 8/19 - Treatment of metabolic derangements as outlined above - Maintain a RASS goal of 0 to -1 - Fentanyl and Propofol to maintain RASS goal - Daily wake up assessment  Best Practice (right click and "Reselect all SmartList Selections" daily)  Diet/type: NPO w/ meds via tube; tube feeds DVT prophylaxis: Arixtra GI prophylaxis: PPI Lines: N/A Foley:  Yes, and it is still needed Code Status:  full code Last date of multidisciplinary goals of care discussion [02/28/22]  Updated pts sister Anne Hahn regarding pts condition and plan of care.  Discussed goals of care and potential need for tracheostomy placement if unable to wean pt off the ventilator.  Mrs. Darral Dash was appreciative to receive an update, and stated she would discuss goals of care with additional family members because she is uncertain if the pt would want to proceed with tracheostomy placement if deemed necessary.  Labs   CBC: Recent Labs  Lab 02/24/22 1007 02/25/22 0609 02/26/22 0459 02/27/22 0449 02/28/22 0324  WBC 9.3 7.4 5.5 5.6 5.4  NEUTROABS 7.9* 5.6  --   --   --   HGB 18.6* 19.1* 17.8* 17.7* 17.6*  HCT 61.8* 63.8* 60.2* 59.2* 58.6*  MCV 94.5 93.7 93.3 92.1 91.8  PLT 186 163 142* 141* 782    Basic Metabolic Panel: Recent Labs  Lab 02/24/22 0531 02/25/22 0609 02/26/22 0459 02/27/22 0449  02/28/22 0324  NA 142 141 141 140 138  K 4.3 3.9 3.7 3.6 3.6  CL 102 100 101 102 101  CO2 26 32 34* 32 30  GLUCOSE 160* 120* 99 109* 108*  BUN 28* 24* 24* 24* 22*  CREATININE 0.84 0.80 0.59* 0.55* 0.53*  CALCIUM 8.6* 8.6* 8.1* 8.5* 8.3*  MG 2.3 2.0 2.0 2.2 2.1  PHOS 3.6 4.2 4.3 3.7 3.8   GFR: Estimated Creatinine Clearance: 138.8 mL/min (A) (by C-G formula based on SCr of 0.53 mg/dL (L)). Recent Labs  Lab 02/23/22 1741 02/24/22 0531 02/24/22 1007 02/25/22 0609 02/26/22 0459 02/27/22 0449 02/28/22 0324  PROCALCITON 0.22 0.18  --  <0.10  --   --   --   WBC 6.9 7.6   < > 7.4 5.5 5.6 5.4   < > = values in this interval not displayed.    Liver Function Tests: No results for input(s): "AST", "ALT", "ALKPHOS", "BILITOT", "PROT", "ALBUMIN" in the last 168 hours. No results for input(s): "LIPASE", "AMYLASE" in the last 168 hours. No results for input(s): "AMMONIA" in the last 168 hours.  ABG    Component Value Date/Time   PHART 7.37 02/28/2022 0516   PCO2ART 62 (H) 02/28/2022 0516   PO2ART 63 (L) 02/28/2022  0516   HCO3 35.8 (H) 02/28/2022 0516   TCO2 39 (H) 01/13/2018 0006   O2SAT 92.3 02/28/2022 0516     Coagulation Profile: No results for input(s): "INR", "PROTIME" in the last 168 hours.  Cardiac Enzymes: No results for input(s): "CKTOTAL", "CKMB", "CKMBINDEX", "TROPONINI" in the last 168 hours.  HbA1C: Hemoglobin A1C  Date/Time Value Ref Range Status  02/13/2018 01:52 PM 7.5 (A) 4.0 - 5.6 % Final   Hgb A1c MFr Bld  Date/Time Value Ref Range Status  02/23/2022 10:05 PM 6.5 (H) 4.8 - 5.6 % Final    Comment:    (NOTE) Pre diabetes:          5.7%-6.4%  Diabetes:              >6.4%  Glycemic control for   <7.0% adults with diabetes   01/13/2018 09:53 AM 7.3 (H) 4.8 - 5.6 % Final    Comment:    (NOTE) Pre diabetes:          5.7%-6.4% Diabetes:              >6.4% Glycemic control for   <7.0% adults with diabetes     CBG: Recent Labs  Lab  02/27/22 1656 02/27/22 1948 02/27/22 2359 02/28/22 0345 02/28/22 0744  GLUCAP 117* 125* 121* 127* 147*    Review of Systems:   UTA-patient intubated and sedated  Past Medical History:  He,  has a past medical history of (HFpEF) heart failure with preserved ejection fraction ([place]), Allergic reaction (12/15/2016), COPD (chronic obstructive pulmonary disease) ([place]), Hyperglycemia (12/2016), Hypertension, and Polycythemia.   Surgical History:   Past Surgical History:  Procedure Laterality Date   KNEE SURGERY Right      Social History:   reports that he has been smoking cigars. He has never used smokeless tobacco. He reports current alcohol use. He reports that he does not use drugs.   Family History:  His family history includes CAD in his brother and father.   Allergies Allergies  Allergen Reactions   Other Anaphylaxis    McCormick's Rub (Food)   Beef-Derived Products Itching    Alpha gal allergy   Pork-Derived Products Hives    Alpha gal     Home Medications  Prior to Admission medications   Medication Sig Start Date End Date Taking? Authorizing Provider  albuterol (PROVENTIL HFA;VENTOLIN HFA) 108 (90 Base) MCG/ACT inhaler Inhale 2 puffs into the lungs every 6 (six) hours as needed for wheezing or shortness of breath. 02/03/18   Azzie Glatter, FNP  aspirin 81 MG tablet Take 81 mg by mouth daily.    [provider]  blood glucose meter kit and supplies Dispense based on patient and insurance preference. Use up to four times daily as directed. (FOR ICD-10 E10.9, E11.9). 01/15/18   Eugenie Filler, MD  buPROPion (WELLBUTRIN SR) 150 MG 12 hr tablet Take 150 mg by mouth 2 (two) times daily.    [provider]  diphenhydrAMINE (BENADRYL) 25 MG tablet Take 1 tablet (25 mg total) by mouth every 6 (six) hours. Patient taking differently: Take 25 mg by mouth daily.  09/07/17   Jola Schmidt, MD  fluticasone (FLONASE) 50 MCG/ACT nasal spray Place 2 sprays into  both nostrils daily. 01/16/18   Eugenie Filler, MD  folic acid (FOLVITE) 1 MG tablet Take 1 tablet (1 mg total) by mouth daily. Patient not taking: Reported on 02/03/2018 01/16/18   Eugenie Filler, MD  furosemide (LASIX) 40  MG tablet Take 1 tablet (40 mg total) by mouth daily. 02/03/18   Azzie Glatter, FNP  meloxicam (MOBIC) 15 MG tablet Take 15 mg by mouth daily.    [provider]  phentermine 15 MG capsule Take 15 mg by mouth every morning.    [provider]  traMADol (ULTRAM) 50 MG tablet Take 1 tablet (50 mg total) by mouth every 8 (eight) hours as needed. 02/13/18   Azzie Glatter, FNP  umeclidinium bromide (INCRUSE ELLIPTA) 62.5 MCG/INH AEPB Inhale 1 puff into the lungs daily. Patient not taking: Reported on 07/28/2019 02/11/18   Azzie Glatter, FNP     Critical care time: 37 minutes    Donell Beers, [place] Pager [phone] (please enter 7 digits) PCCM Consult Pager [phone] (please enter 7 digits)

## 2022-02-28 NOTE — Progress Notes (Signed)
     Referral received for Posey Rea re: goals of care discussion. Chart reviewed and updates received from RN Casimiro Needle and NP Delton See. Patient assessed and is unable to engage appropriately in discussions (MV and sedated). Attempted to contact patient's sister Lupita Leash. Unable to reach and HIPAA appropriate voicemail left with PMT contact information given.   PMT will re-attempt to contact family at a later time/date. Detailed note and recommendations to follow once GOC has been completed.   Thank you for your referral and allowing PMT to assist in Mr. Bryan Mitchell care.   Georgiann Cocker, FNP-BC Palliative Medicine Team  Phone: [phone]  NO CHARGE

## 2022-03-01 ENCOUNTER — Inpatient Hospital Stay: Payer: Medicaid Other

## 2022-03-01 DIAGNOSIS — Z789 Other specified health status: Secondary | ICD-10-CM

## 2022-03-01 LAB — BASIC METABOLIC PANEL
Anion gap: 8 (ref 5–15)
BUN: 29 mg/dL — ABNORMAL HIGH (ref 6–20)
CO2: 31 mmol/L (ref 22–32)
Calcium: 8.4 mg/dL — ABNORMAL LOW (ref 8.9–10.3)
Chloride: 97 mmol/L — ABNORMAL LOW (ref 98–111)
Creatinine, Ser: 0.63 mg/dL (ref 0.61–1.24)
GFR, Estimated: >60 mL/min (ref >60)
Glucose, Bld: 134 mg/dL — ABNORMAL HIGH (ref 70–99)
Potassium: 3.7 mmol/L (ref 3.5–5.1)
Sodium: 136 mmol/L (ref 135–145)

## 2022-03-01 LAB — CBC
HCT: 57.8 % — ABNORMAL HIGH (ref 39.0–52.0)
Hemoglobin: 17.3 g/dL — ABNORMAL HIGH (ref 13.0–17.0)
MCH: 27.9 pg (ref 26.0–34.0)
MCHC: 29.9 g/dL — ABNORMAL LOW (ref 30.0–36.0)
MCV: 93.2 fL (ref 80.0–100.0)
Platelets: 180 10*3/uL (ref 150–400)
RBC: 6.2 MIL/uL — ABNORMAL HIGH (ref 4.22–5.81)
RDW: 17.6 % — ABNORMAL HIGH (ref 11.5–15.5)
WBC: 6.6 10*3/uL (ref 4.0–10.5)
nRBC: 0 % (ref 0.0–0.2)

## 2022-03-01 LAB — GLUCOSE, CAPILLARY
Glucose-Capillary: 125 mg/dL — ABNORMAL HIGH (ref 70–99)
Glucose-Capillary: 148 mg/dL — ABNORMAL HIGH (ref 70–99)
Glucose-Capillary: 155 mg/dL — ABNORMAL HIGH (ref 70–99)
Glucose-Capillary: 159 mg/dL — ABNORMAL HIGH (ref 70–99)
Glucose-Capillary: 162 mg/dL — ABNORMAL HIGH (ref 70–99)
Glucose-Capillary: 175 mg/dL — ABNORMAL HIGH (ref 70–99)

## 2022-03-01 LAB — MAGNESIUM: Magnesium: 2.4 mg/dL (ref 1.7–2.4)

## 2022-03-01 LAB — PHOSPHORUS: Phosphorus: 4.1 mg/dL (ref 2.5–4.6)

## 2022-03-01 MED ORDER — MAGNESIUM SULFATE 2 GM/50ML IV SOLN
2.0000 g | Freq: Once | INTRAVENOUS | Status: AC
  Administered 2022-03-01: 2 g via INTRAVENOUS
  Filled 2022-03-01: qty 50

## 2022-03-01 MED ORDER — FUROSEMIDE 10 MG/ML IJ SOLN
40.0000 mg | Freq: Once | INTRAMUSCULAR | Status: AC
  Administered 2022-03-01: 40 mg via INTRAVENOUS
  Filled 2022-03-01: qty 4

## 2022-03-01 MED ORDER — METHYLPREDNISOLONE SODIUM SUCC 125 MG IJ SOLR
INTRAMUSCULAR | Status: AC
  Administered 2022-03-01: 80 mg via INTRAVENOUS
  Filled 2022-03-01: qty 2

## 2022-03-01 MED ORDER — METHYLPREDNISOLONE SODIUM SUCC 125 MG IJ SOLR: 80.0000 mg | Freq: Once | INTRAMUSCULAR | Status: AC

## 2022-03-01 MED ORDER — POTASSIUM CHLORIDE 20 MEQ PO PACK
40.0000 meq | PACK | Freq: Once | ORAL | Status: AC
  Administered 2022-03-01: 40 meq
  Filled 2022-03-01: qty 2

## 2022-03-01 NOTE — TOC Progression Note (Signed)
Transition of Care [facility]) - Progression Note    Patient Details  Name: Bryan Mitchell MRN: [id number] Date of Birth: Aug 16, 1960  [facility] CM/SW Contact  Allayne Butcher, RN Phone Number: 03/01/2022, 2:35 PM  Clinical Narrative:    Patient remains intubated and sedated.  Palliative care team is following for GOC.   TOC will cont to follow.         Expected Discharge Plan and Services                                                 Social Determinants of Health (SDOH) Interventions    Readmission Risk Interventions     No data to display

## 2022-03-01 NOTE — Progress Notes (Addendum)
NAME:  Ansel Ferrall, MRN:  [id number], DOB:  1961-04-23, LOS: 6 ADMISSION DATE:  02/23/2022, CONSULTATION DATE:  02/23/22 REFERRING MD:  Dr. Toy Cookey, CHIEF COMPLAINT:  SOB and fatigue   History of Present Illness:  61 yo M presenting to [facility] ED on 02/23/22 from home in a wheelchair with his sister for evaluation of worsening fatigue, poor appetite and SOB over the past 2 weeks. Per ED documentation, patient also complained of worsening bilateral leg swelling and pain.  Sister interviewed over the telephone providing supplemental history. She described that he lives alone, and began not feeling good for about 2 weeks with complaints of dyspnea and fatigue. He self-treated with Mucinex and took a COVID test which was negative. He started having increased congestion and poor PO intake. On the night of 8/18 the sister spoke with the patient on the phone and he told her, "I feel like I'm dying". Today on 8/19 she convinced the patient to go to the hospital to be evaluated. She reports he does not trust doctors and has difficulty following outpatient medication regimens. He was prescribed home O2, and has oxygen tanks at the house but has not used them in 2 years per his sister. He also injured his RIGHT knee a few weeks ago and had been getting around with a crutch. She reported he had been a little more confused on 8/19.  Per ED documentation, he endorsed a productive cough, but denied fever/chills. Patient admitted to not taking his Lasix when interviewed by [facility] service, per their documentation. And he has noticed increased pedal edema recently.  ED course: Upon arrival in triage the patient was profoundly hypoxic with his SpO2 was in the 60's on RA, this initially recovered to 99% on 3 L [place]. ABG revealed partially compensated hypercapnia and he was placed on BIPAP. He received Lasix for diuresis, BNP was mildly elevated. CT angio negative for pulmonary edema/effusions, negative for PE. [facility] service  consulted for admission. After a couple of hours on BIPAP, patient became combative and agitated, ripping off the BIPAP mask. Patient emergently intubated in ED. PCCM consulted. Medications given: IV contrast, Duo-nebs, lasix, fondaparinux, doxycycline, fentanyl, versed, ketamine, rocuronium & succinylcholine for intubation, solumedrol, propofol drip started Initial Vitals: 97.9, 20, 100, 117/68 & SpO2 62% on RA Significant labs: (Labs/ Imaging personally reviewed) I, Domingo Pulse [place], AGACNP-BC, personally viewed and interpreted this ECG. EKG Interpretation: Date: 02/23/22, EKG Time: 16:27, Rate: 98, Rhythm: NSR, QRS Axis:  borderline RAD, Intervals: normal, ST/T Wave abnormalities: inferior T wave inversions, Narrative Interpretation: NSR with borderline RAD and probable LAE with inferior T wave inversions Chemistry: Na+: 139, K+: 3.8, BUN/Cr.: 31/0.79, Serum CO2/ AG: 32/8 Hematology: WBC: 6.9, Hgb: 18.9, Hct: 63.3, plt: 178 BNP: 283.7, PCT: 0.22, COVID-19 & Influenza A/B: negative ABG: 7.25/ 99/ 59/ 43.4 CXR 02/23/22: pulmonary hypoinflation. Central pulmonary vascular engorgement without overt pulmonary edema. Stable tubes CT 02/23/22: no evidence of pulmonary embolism. Mild cardiomegaly, CAD, main pulmonary trunk is dilated measuring 3.9 cm suggesting pulmonary arterial hypertension. Mild bibasilar atelectasis. No evidence of pulmonary edema or pleural effusion. CT head wo contrast 02/23/22: pending  PCCM consulted due to worsening acute on chronic hypoxic/hypercapnic respiratory failure requiring emergent intubation and mechanical ventilatory support.  Pertinent  Medical History  T2DM COPD Morbid obesity Tobacco abuse (cigars, 40 pack year history) Secondary Polycythemia HFpEF  Micro Data:  8/19: SARS-CoV-2 & Influenza PCR>>negative 8/19: RVP>> Rhinovirus/Enterovirus 8/19: MRSA PCR>> negative 8/20: Tracheal aspirate>>normal flora  Antimicrobials:  Ceftriaxone  8/19>>8/23 Doxycycline 8/19>>8/23  Significant Hospital Events: Including procedures, antibiotic start and stop dates in addition to other pertinent events   02/23/22: Admit to ICU with worsening acute on chronic hypoxic/hypercapnic respiratory failure s/t suspected COPD exacerbation in the setting of HFpEF and possible pulmonary hypertension requiring emergent intubation and mechanical ventilatory support. 02/24/22: Pt remains mechanically intubated settings decreased: PEEP 5/FiO2 40%.   02/25/22: Increased vent requirements this morning due to diffuse pulmonary vascular congestion, received lasix. Tracheal aspirate with gram + cocci & rods, gram - rods.  Severe Delirium upon WUA. 02/26/22: Remains on vent, 60% fio2 & 8 PEEP.  Diurese with Diamox + Metolazone. 02/27/22: No acute events overnight; remains mechanically intubated current vent settings: 40%/8 PEEP  02/28/22: Pts O2 requirements increased overnight due to hypoxia vent settings: FiO2 50%/PEEP 8 unable to perform SBT  03/01/22: Overnight with hypoxia, concern for ? Mucus plug.  Vent settings this am 100% FiO2 & 8 PEEP.  Plan for Diuresis, considering Bronchoscopy  Interim History / Subjective:  -Overnight with episode of hypoxia, required bagging (? Mucus plug) ~ this morning requiring vent settings: 100% FiO2 and 8 PEEP with O2 sats 92% -CXR with pulmonary edema and atelectasis ~ will diurese today -May need bronchoscopy ~ will discuss with Dr. Merrilee Jansky -Afebrile, no vasopressors -WBC normal, Creatinine normal, UOP1.7 L past 24 hrs (net+ 2L)  Objective   Blood pressure 109/69, pulse 80, temperature 97.9 F (36.6 C), resp. rate 16, height 5' 8"  (1.727 m), weight (!) 144.3 kg, SpO2 92 %.    Vent Mode: PRVC FiO2 (%):  [50 %-100 %] 100 % Set Rate:  [16 bmp] 16 bmp Vt Set:  [550 mL] 550 mL PEEP:  [8 cmH20] 8 cmH20 Plateau Pressure:  [20 cmH20] 20 cmH20   Intake/Output Summary (Last 24 hours) at 03/01/2022 7824 Last data filed at  03/01/2022 2353 Gross per 24 hour  Intake 2816.44 ml  Output 1735 ml  Net 1081.44 ml    Filed Weights   02/27/22 0422 02/28/22 0431 03/01/22 0430  Weight: (!) 142.9 kg (!) 144.2 kg (!) 144.3 kg    Examination: General: Acutely on chronically-ill appearing male resting in bed, NAD mechanically intubated  HEENT: Supple, no JVD  Neuro: RASS: -3, unable to follow commands, PERRL CV: NSR, rrr, no r/g, 2+ radial/1+ distal pulses, 1+ bilateral lower extremity edema  Pulm: Coarse throughout, even, non labored, synchronous with vent GI: +BS x4, obese, soft, non distended  GU: Foley in place with clear yellow urine Skin: Scattered papules on chest, axillae and groin- possible heat rash Extremities: Normal bulk and tone  Resolved Hospital Problem list   Hypotension secondary to possible sepsis vs sedation related   Assessment & Plan:   Acute on Chronic Hypoxic / Hypercapnic Respiratory Failure suspect multifocal in the setting of suspected COPD exacerbation and decompensated HFpEF and rhinovirus Tobacco Abuse PMHx: COPD, morbid obesity, tobacco abuse, HFpEF - Full vent support, implement lung protective strategies - Plateau pressures less than 30 cm H20 - Wean FiO2 & PEEP as tolerated to maintain O2 sats 88 to 92% - Follow intermittent Chest X-ray & ABG as needed - Spontaneous Breathing Trials when respiratory parameters met and mental status permits - VAP Bundle - Bronchodilators & Pulmicort nebs -Completed course of ABX as above -Diuresis as BP and renal function permits ~ will give 40 mg IV Lasix 8/25 -Discussed with Dr. Merrilee Jansky, considering bronchoscopy 8/25  Acute on Chronic HFpEF exacerbation Mildly elevated Troponin, suspect demand ischemia Query Pulmonary  HTN PMHx: HFpEF  Echocardiogram 02/24/22: LVEF 60-65%, Grade II DD - Continuous  telemetry monitoring - Maintain MAP >65 - Vasopressors as needed to maintain MAP goal ~ currently not requiring - Trend HS Troponin until  downtrending (59 ~ 71 ~ 143 ~ 221 ~) - Diurese as BP and renal function permits to keep net negative: will give a dose of Lasix 8/25  Rhinovirus - Trend WBC and monitor fever curve  - Completed course of abx therapy due to suspected bacterial pneumonia, however tracheal aspirate with normal respiratory flora  Polycythemia suspected secondary to chronic hypoxia ~improving  Followed by oncology outpatient, last seen 10/2019. He was offered a sleep study which he declined. At that time, he was deemed not a candidate for continued follow up as Hgb had stabilized at 17.0 & HCT was 51%.  Hgb: 18.9, Hct: 63.3 - Daily CBC - Spoke with on call oncology NP on 08/20: no interventions required at this time due to CTA Chest negative for PE and pts noncompliance with outpatient oxygen has likely worsened polycythemia.  Once pts oxygenation improves this will likely improve polycythemia pt can follow-up with hematology in outpatient setting.  Official hematology consult canceled   Type 2 Diabetes Mellitus Hemoglobin A1C: 6.5 - Monitor CBG Q 4 hours - SSI resistant dosing - Target range while in ICU: 140-180 - Follow ICU hyper/hypo-glycemia protocol  Acute Metabolic Encephalopathy Sedation needs in the setting of mechanical ventilation  CT Head negative 8/19 - Treatment of metabolic derangements as outlined above - Maintain a RASS goal of 0 to -1 - Fentanyl and Propofol to maintain RASS goal - Daily wake up assessment   Best Practice (right click and "Reselect all SmartList Selections" daily)  Diet/type: NPO w/ meds via tube; tube feeds DVT prophylaxis: Arixtra GI prophylaxis: PPI Lines: N/A Foley:  Yes, and it is still needed Code Status:  full code Last date of multidisciplinary goals of care discussion [03/01/22]   Labs   CBC: Recent Labs  Lab 02/24/22 1007 02/25/22 0609 02/26/22 0459 02/27/22 0449 02/28/22 0324 03/01/22 0556  WBC 9.3 7.4 5.5 5.6 5.4 6.6  NEUTROABS 7.9* 5.6  --    --   --   --   HGB 18.6* 19.1* 17.8* 17.7* 17.6* 17.3*  HCT 61.8* 63.8* 60.2* 59.2* 58.6* 57.8*  MCV 94.5 93.7 93.3 92.1 91.8 93.2  PLT 186 163 142* 141* 154 180     Basic Metabolic Panel: Recent Labs  Lab 02/25/22 0609 02/26/22 0459 02/27/22 0449 02/28/22 0324 03/01/22 0556  NA 141 141 140 138 136  K 3.9 3.7 3.6 3.6 3.7  CL 100 101 102 101 97*  CO2 32 34* 32 30 31  GLUCOSE 120* 99 109* 108* 134*  BUN 24* 24* 24* 22* 29*  CREATININE 0.80 0.59* 0.55* 0.53* 0.63  CALCIUM 8.6* 8.1* 8.5* 8.3* 8.4*  MG 2.0 2.0 2.2 2.1 2.4  PHOS 4.2 4.3 3.7 3.8 4.1    GFR: Estimated Creatinine Clearance: 138.9 mL/min (by C-G formula based on SCr of 0.63 mg/dL). Recent Labs  Lab 02/23/22 1741 02/24/22 0531 02/24/22 1007 02/25/22 0609 02/26/22 0459 02/27/22 0449 02/28/22 0324 03/01/22 0556  PROCALCITON 0.22 0.18  --  <0.10  --   --   --   --   WBC 6.9 7.6   < > 7.4 5.5 5.6 5.4 6.6   < > = values in this interval not displayed.     Liver Function Tests: No results for input(s): "AST", "ALT", "ALKPHOS", "  BILITOT", "PROT", "ALBUMIN" in the last 168 hours. No results for input(s): "LIPASE", "AMYLASE" in the last 168 hours. No results for input(s): "AMMONIA" in the last 168 hours.  ABG    Component Value Date/Time   PHART 7.37 02/28/2022 0516   PCO2ART 62 (H) 02/28/2022 0516   PO2ART 63 (L) 02/28/2022 0516   HCO3 35.8 (H) 02/28/2022 0516   TCO2 39 (H) 01/13/2018 0006   O2SAT 92.3 02/28/2022 0516     Coagulation Profile: No results for input(s): "INR", "PROTIME" in the last 168 hours.  Cardiac Enzymes: No results for input(s): "CKTOTAL", "CKMB", "CKMBINDEX", "TROPONINI" in the last 168 hours.  HbA1C: Hemoglobin A1C  Date/Time Value Ref Range Status  02/13/2018 01:52 PM 7.5 (A) 4.0 - 5.6 % Final   Hgb A1c MFr Bld  Date/Time Value Ref Range Status  02/23/2022 10:05 PM 6.5 (H) 4.8 - 5.6 % Final    Comment:    (NOTE) Pre diabetes:          5.7%-6.4%  Diabetes:               >6.4%  Glycemic control for   <7.0% adults with diabetes   01/13/2018 09:53 AM 7.3 (H) 4.8 - 5.6 % Final    Comment:    (NOTE) Pre diabetes:          5.7%-6.4% Diabetes:              >6.4% Glycemic control for   <7.0% adults with diabetes     CBG: Recent Labs  Lab 02/28/22 1539 02/28/22 1937 02/28/22 2330 03/01/22 0309 03/01/22 0731  GLUCAP 124* 129* 138* 125* 162*     Review of Systems:   UTA-patient intubated and sedated  Past Medical History:  He,  has a past medical history of (HFpEF) heart failure with preserved ejection fraction ([place]), Allergic reaction (12/15/2016), COPD (chronic obstructive pulmonary disease) ([place]), Hyperglycemia (12/2016), Hypertension, and Polycythemia.   Surgical History:   Past Surgical History:  Procedure Laterality Date   KNEE SURGERY Right      Social History:   reports that he has been smoking cigars. He has never used smokeless tobacco. He reports current alcohol use. He reports that he does not use drugs.   Family History:  His family history includes CAD in his brother and father.   Allergies Allergies  Allergen Reactions   Other Anaphylaxis    McCormick's Rub (Food)   Beef-Derived Products Itching    Alpha gal allergy   Pork-Derived Products Hives    Alpha gal     Home Medications  Prior to Admission medications   Medication Sig Start Date End Date Taking? Authorizing Provider  albuterol (PROVENTIL HFA;VENTOLIN HFA) 108 (90 Base) MCG/ACT inhaler Inhale 2 puffs into the lungs every 6 (six) hours as needed for wheezing or shortness of breath. 02/03/18   Azzie Glatter, FNP  aspirin 81 MG tablet Take 81 mg by mouth daily.    [provider]  blood glucose meter kit and supplies Dispense based on patient and insurance preference. Use up to four times daily as directed. (FOR ICD-10 E10.9, E11.9). 01/15/18   Eugenie Filler, MD  buPROPion (WELLBUTRIN SR) 150 MG 12 hr tablet Take 150 mg by mouth 2 (two) times  daily.    [provider]  diphenhydrAMINE (BENADRYL) 25 MG tablet Take 1 tablet (25 mg total) by mouth every 6 (six) hours. Patient taking differently: Take 25 mg by mouth daily.  09/07/17  Jola Schmidt, MD  fluticasone [facility]) 50 MCG/ACT nasal spray Place 2 sprays into both nostrils daily. 01/16/18   Eugenie Filler, MD  folic acid (FOLVITE) 1 MG tablet Take 1 tablet (1 mg total) by mouth daily. Patient not taking: Reported on 02/03/2018 01/16/18   Eugenie Filler, MD  furosemide (LASIX) 40 MG tablet Take 1 tablet (40 mg total) by mouth daily. 02/03/18   Azzie Glatter, FNP  meloxicam (MOBIC) 15 MG tablet Take 15 mg by mouth daily.    [provider]  phentermine 15 MG capsule Take 15 mg by mouth every morning.    [provider]  traMADol (ULTRAM) 50 MG tablet Take 1 tablet (50 mg total) by mouth every 8 (eight) hours as needed. 02/13/18   Azzie Glatter, FNP  umeclidinium bromide (INCRUSE ELLIPTA) 62.5 MCG/INH AEPB Inhale 1 puff into the lungs daily. Patient not taking: Reported on 07/28/2019 02/11/18   Azzie Glatter, FNP     Critical care time: 40 minutes    Darel Hong, AGACNP-BC [facility] Prefer epic messenger for cross cover needs If after hours, please call E-link

## 2022-03-01 NOTE — Consult Note (Addendum)
Consultation Note Date: 03/01/2022   Patient Name: Bryan Mitchell  DOB: 1961-02-25  MRN: [id number]  Age / Sex: 61 y.o., male  PCP: Pcp, No Referring Physician: Erin Fulling, MD  Reason for Consultation: Establishing goals of care  HPI/Patient Profile: 61 y.o. male  with past medical history of type 2 diabetes, COPD, tobacco abuse, polycythemia, HFpEF, and morbid obesity admitted on 02/23/2022 with worsening fatigue, poor appetite, and shortness of breath for 2 weeks.  Due to worsening hypoxic and hypercapnic respiratory failure due to suspected COPD exacerbation in setting of HFpEF, patient was emergently intubated and placed on mechanical ventilator support on 8/19.  Since then, patient has failed wake-up assessments and SBT T.  He is required increased ventilatory support.  On night of 8/25, patient had hypoxic event with concern for mucous plug.  CCM was considering bronchoscopy for today.  PMT was consulted to discuss goals of care.   Clinical Assessment and Goals of Care: I have reviewed medical records including EPIC notes, labs and imaging, assessed the patient, he was sedated and ventilated-unable to participate in complex medical discussions, and then spoke with patient's Sister Bryan Mitchell over the phone to discuss diagnosis prognosis, GOC, EOL wishes, disposition and options.  I introduced Palliative Medicine as specialized medical care for people living with serious illness. It focuses on providing relief from the symptoms and stress of a serious illness. The goal is to improve quality of life for both the patient and the family.  We discussed a brief life review of the patient.  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  Patient is 1 of 12 children, 8 brothers and 4 sisters.  4 sisters and 1 brother have passed.  Patient was never married and has no kids. Bryan Mitchell is patient's next of kin  and decision maker. She says she is only family nearby and one that can handle making medical decisions for Bryan Mitchell.    She shares his personality is blunted, to the point, and yet he had a good sense of humor.  She shares he was very proud of himself and would not ask others easily for help.  She shares that Bryan Mitchell disliked male doctors and had stopped going to doctors appointments altogether about a year and a half ago.  She believes he stopped taking his medications a few weeks ago  As far as functional and nutritional status patient was living in a mobile home by himself without a working refrigerator.  He was using food stamps and cooking on a wood stove. He does not have a driver's license and relies on others to take him places.   We discussed patient's current illness and what it means in the larger context of patient's on-going co-morbidities.  Education provided on COPD and heart failure.  Reviewed his chronic illnesses are progressive and irreversible.  Goal is to optimize patient's health with the understanding we cannot "cure" him from these illnesses.  I attempted to elicit values and goals of care important to the patient.  Bryan Mitchell shares she is hoping for a miracle.  In the same breath, she says she has lost 4 brothers and it is about to be 5.  Bryan Mitchell says she knows the patient is sick and that he might not pull through this.  However, she wants anything and everything done in order to save his life.  When asked if Bryan Mitchell would be in agreement with the quality of life he is currently experiencing, daughter says she believes he would "want none of it".  The difference between aggressive medical intervention and comfort care was considered.  Discussed potential of placing a trach versus allowing a natural passing with removal of machines/interventions/ventilator.  Bryan Mitchell asked appropriate questions in regards to tracheostomy.  Advance directives, concepts specific to code status, artificial feeding  and hydration, and rehospitalization were considered and discussed.  Bryan Mitchell is accepting of all offered, available, and appropriate interventions to sustain her brother's life.  I cautioned her to consider what Bryan Mitchell would want for himself versus what she wants for him.  She says she will continue to think about it and see how he improves over the next few days.  Education offered regarding concept specific to human mortality and the limitations of medical interventions to prolong life when the body begins to fail to thrive.  Family is facing treatment option decisions and anticipatory care needs.    Discussed with Bryan Mitchell the importance of continued conversation with family and the medical providers regarding overall plan of care and treatment options, ensuring decisions are within the context of the patient's values and goals of care.    Full code and full scope of treatment remains.  Questions and concerns were addressed. The family was encouraged to call with questions or concerns.   Primary Decision Maker NEXT OF KIN  Code Status/Advance Care Planning: Full code  Prognosis:   Unable to determine  Discharge Planning: To Be Determined  Primary Diagnoses: Present on Admission:  Acute hypoxemic respiratory failure (HCC)  COPD exacerbation (HCC)  Morbid obesity (HCC)  Type 2 diabetes mellitus with complication, without long-term current use of insulin (HCC)  Tobacco abuse  Acute respiratory failure (HCC)   Physical Exam Vitals reviewed.  Constitutional:      General: He is not in acute distress.    Appearance: He is obese. He is ill-appearing.  HENT:     Head: Normocephalic.  Pulmonary:     Comments: MV Abdominal:     Palpations: Abdomen is soft.  Musculoskeletal:     Comments: sedated  Skin:    General: Skin is warm and dry.     Palliative Assessment/Data: 30%     Thank you for this consult. Palliative medicine will continue to follow and assist holistically.    Time Total: 75 minutes Greater than 50%  of this time was spent counseling and coordinating care related to the above assessment and plan.  Signed by: Georgiann Cocker, DNP, FNP-BC Palliative Medicine  Please contact Palliative Medicine Team phone at [phone] for questions and concerns.  For individual provider: See Loretha Stapler

## 2022-03-01 NOTE — Consult Note (Signed)
PHARMACY CONSULT NOTE  Pharmacy Consult for Electrolyte Monitoring and Replacement   Recent Labs: Potassium (mmol/L)  Date Value  03/01/2022 3.7  04/21/2012 4.8   Magnesium (mg/dL)  Date Value  04/59/9774 2.4   Calcium (mg/dL)  Date Value  14/23/9532 8.4 (L)   Calcium, Total (mg/dL)  Date Value  02/33/4356 8.8   Albumin (g/dL)  Date Value  86/16/8372 4.1  04/21/2012 4.0   Phosphorus (mg/dL)  Date Value  90/21/1155 4.1   Sodium (mmol/L)  Date Value  03/01/2022 136  04/21/2012 139    Assessment: Pharmacy has been consulted to monitor electrolytes in 60yo male presenting to the ED complaining of worsening bilateral leg swelling and pain. He was self-treating with mucinex and took a COVID test which was negative. He is currently intubated and sedated in CCU. Pharmacy is asked to follow and replace electrolytes while in CCU  Diuretics: furosemide 40 mg IV x 1  Goal of Therapy:  Electrolytes WNL  Plan:  40 mEq KCl per tube x 1 given potassium is borderline wnl and diuretics as above Will recheck electrolytes with AM labs   Lowella Bandy ,PharmD Clinical Pharmacist 03/01/2022 7:14 AM

## 2022-03-01 NOTE — Progress Notes (Signed)
ETT advanced to 26 cm per NP based on CXR

## 2022-03-01 NOTE — Progress Notes (Signed)
Nutrition Follow Up Note   DOCUMENTATION CODES:   Morbid obesity  INTERVENTION:   Vital HP @55ml /hr continuous   Propofol: 26.1 ml/hr- provides 689kcal/day   Free water flushes 159m q4 hours   Regimen provides 2009kcal/day, 116g/day protein and 17069mday of free water.   Liquid MVI daily via tube   NUTRITION DIAGNOSIS:   Inadequate oral intake related to inability to eat (pt sedated and ventilated) as evidenced by NPO status.  GOAL:   Provide needs based on ASPEN/SCCM guidelines -met   MONITOR:   Vent status, Labs, Weight trends, TF tolerance, Skin, I & O's  ASSESSMENT:   [age]/o male with h/o COPD, DM, HTN, HLD, CHF, polycythemia and alpha gal who is admitted with rhinovirus, PNA and AMS.  Pt remains sedated and ventilated. OGT in place. Tube feeds held overnight for possible aspiration. NSF on chest x-ray; OGT in good position. Will plan to resume tube feeds today. No BM noted since admission; NP aware. Per chart, pt appears weight stable since admission and appears to be at his UBW. Pt +2.0L on his I & Os.   Medications reviewed and include: colace, insulin, MVI, nicotine, protonix, miralax, propofol   Labs reviewed: K 3.7 wnl, BUN 29(H), P 4.1 wnl, Mg 2.4 wnl Cbgs- 175, 162, 125 x 24 hrs  Patient is currently intubated on ventilator support MV: 8.6 L/min Temp (24hrs), Avg:97.9 F (36.6 C), Min:97.2 F (36.2 C), Max:98.8 F (37.1 C)  Propofol: 26.1 ml/hr- provides 689kcal/day   MAP- >6559m   UOP- 1970m110mNUTRITION - FOCUSED PHYSICAL EXAM:  Flowsheet Row Most Recent Value  Orbital Region No depletion  Upper Arm Region No depletion  Thoracic and Lumbar Region No depletion  Buccal Region No depletion  Temple Region No depletion  Clavicle Bone Region No depletion  Clavicle and Acromion Bone Region No depletion  Scapular Bone Region No depletion  Dorsal Hand No depletion  Patellar Region No depletion  Anterior Thigh Region No depletion  Posterior  Calf Region No depletion  Edema (RD Assessment) Moderate  Hair Reviewed  Eyes Reviewed  Mouth Reviewed  Skin Reviewed  Nails Reviewed   Diet Order:   Diet Order             Diet NPO time specified  Diet effective now                  EDUCATION NEEDS:   No education needs have been identified at this time  Skin:  Skin Assessment: Reviewed RN Assessment (ecchymosis)  Last BM:  pta  Height:   Ht Readings from Last 1 Encounters:  02/23/22 5' 8"  (1.727 m)    Weight:   Wt Readings from Last 1 Encounters:  03/01/22 (!) 144.3 kg    Ideal Body Weight:  70 kg  BMI:  Body mass index is 48.37 kg/m.  Estimated Nutritional Needs:   Kcal:  1536-1955kcal/day  Protein:  150-175g/day  Fluid:  2.1-2.4L/day  CaseKoleen Distance RD, LDN Please refer to [facility] and/or RD on-call/weekend/after hours pager

## 2022-03-02 ENCOUNTER — Inpatient Hospital Stay: Payer: Medicaid Other

## 2022-03-02 LAB — BLOOD GAS, ARTERIAL
Acid-Base Excess: 9.7 mmol/L — ABNORMAL HIGH (ref 0.0–2.0)
Bicarbonate: 37.6 mmol/L — ABNORMAL HIGH (ref 20.0–28.0)
FIO2: 100 %
MECHVT: 500 mL
Mechanical Rate: 16
O2 Saturation: 97.9 %
PEEP: 12 cmH2O
Patient temperature: 37
pCO2 arterial: 65 mmHg — ABNORMAL HIGH (ref 32–48)
pH, Arterial: 7.37 (ref 7.35–7.45)
pO2, Arterial: 95 mmHg (ref 83–108)

## 2022-03-02 LAB — GLUCOSE, CAPILLARY
Glucose-Capillary: 111 mg/dL — ABNORMAL HIGH (ref 70–99)
Glucose-Capillary: 128 mg/dL — ABNORMAL HIGH (ref 70–99)
Glucose-Capillary: 163 mg/dL — ABNORMAL HIGH (ref 70–99)
Glucose-Capillary: 167 mg/dL — ABNORMAL HIGH (ref 70–99)
Glucose-Capillary: 175 mg/dL — ABNORMAL HIGH (ref 70–99)
Glucose-Capillary: 189 mg/dL — ABNORMAL HIGH (ref 70–99)

## 2022-03-02 LAB — CBC
HCT: 55.6 % — ABNORMAL HIGH (ref 39.0–52.0)
Hemoglobin: 16.7 g/dL (ref 13.0–17.0)
MCH: 27.5 pg (ref 26.0–34.0)
MCHC: 30 g/dL (ref 30.0–36.0)
MCV: 91.6 fL (ref 80.0–100.0)
Platelets: 206 10*3/uL (ref 150–400)
RBC: 6.07 MIL/uL — ABNORMAL HIGH (ref 4.22–5.81)
RDW: 17.1 % — ABNORMAL HIGH (ref 11.5–15.5)
WBC: 6.4 10*3/uL (ref 4.0–10.5)
nRBC: 0 % (ref 0.0–0.2)

## 2022-03-02 LAB — BASIC METABOLIC PANEL
Anion gap: 5 (ref 5–15)
BUN: 35 mg/dL — ABNORMAL HIGH (ref 6–20)
CO2: 32 mmol/L (ref 22–32)
Calcium: 8.7 mg/dL — ABNORMAL LOW (ref 8.9–10.3)
Chloride: 99 mmol/L (ref 98–111)
Creatinine, Ser: 0.78 mg/dL (ref 0.61–1.24)
GFR, Estimated: >60 mL/min (ref >60)
Glucose, Bld: 147 mg/dL — ABNORMAL HIGH (ref 70–99)
Potassium: 3.7 mmol/L (ref 3.5–5.1)
Sodium: 136 mmol/L (ref 135–145)

## 2022-03-02 LAB — PHOSPHORUS: Phosphorus: 3.6 mg/dL (ref 2.5–4.6)

## 2022-03-02 LAB — TRIGLYCERIDES: Triglycerides: 262 mg/dL — ABNORMAL HIGH (ref <150)

## 2022-03-02 LAB — MAGNESIUM: Magnesium: 2.5 mg/dL — ABNORMAL HIGH (ref 1.7–2.4)

## 2022-03-02 MED ORDER — FUROSEMIDE 10 MG/ML IJ SOLN
40.0000 mg | Freq: Once | INTRAMUSCULAR | Status: AC
  Administered 2022-03-02: 40 mg via INTRAVENOUS
  Filled 2022-03-02: qty 4

## 2022-03-02 MED ORDER — DOCUSATE SODIUM 50 MG/5ML PO LIQD
100.0000 mg | Freq: Two times a day (BID) | ORAL | Status: DC | PRN
  Administered 2022-03-10: 100 mg

## 2022-03-02 MED ORDER — ACETAMINOPHEN 325 MG PO TABS
650.0000 mg | ORAL_TABLET | ORAL | Status: DC | PRN
  Administered 2022-03-05 – 2022-03-18 (×5): 650 mg
  Filled 2022-03-02 (×4): qty 2

## 2022-03-02 NOTE — Progress Notes (Signed)
Peep to 12 due to desats

## 2022-03-02 NOTE — Progress Notes (Signed)
NAME:  Bryan Mitchell, MRN:  [id number], DOB:  1961-07-07, LOS: 7 ADMISSION DATE:  02/23/2022, CONSULTATION DATE:  02/23/22 REFERRING MD:  Dr. Toy Cookey, CHIEF COMPLAINT:  SOB and fatigue   History of Present Illness:  61 yo M presenting to [facility] ED on 02/23/22 from home in a wheelchair with his sister for evaluation of worsening fatigue, poor appetite and SOB over the past 2 weeks. Per ED documentation, patient also complained of worsening bilateral leg swelling and pain.  Sister interviewed over the telephone providing supplemental history. She described that he lives alone, and began not feeling good for about 2 weeks with complaints of dyspnea and fatigue. He self-treated with Mucinex and took a COVID test which was negative. He started having increased congestion and poor PO intake. On the night of 8/18 the sister spoke with the patient on the phone and he told her, "I feel like I'm dying". Today on 8/19 she convinced the patient to go to the hospital to be evaluated. She reports he does not trust doctors and has difficulty following outpatient medication regimens. He was prescribed home O2, and has oxygen tanks at the house but has not used them in 2 years per his sister. He also injured his RIGHT knee a few weeks ago and had been getting around with a crutch. She reported he had been a little more confused on 8/19.  Per ED documentation, he endorsed a productive cough, but denied fever/chills. Patient admitted to not taking his Lasix when interviewed by [facility] service, per their documentation. And he has noticed increased pedal edema recently.  ED course: Upon arrival in triage the patient was profoundly hypoxic with his SpO2 was in the 60's on RA, this initially recovered to 99% on 3 L [place]. ABG revealed partially compensated hypercapnia and he was placed on BIPAP. He received Lasix for diuresis, BNP was mildly elevated. CT angio negative for pulmonary edema/effusions, negative for PE. [facility] service  consulted for admission. After a couple of hours on BIPAP, patient became combative and agitated, ripping off the BIPAP mask. Patient emergently intubated in ED. PCCM consulted. Medications given: IV contrast, Duo-nebs, lasix, fondaparinux, doxycycline, fentanyl, versed, ketamine, rocuronium & succinylcholine for intubation, solumedrol, propofol drip started Initial Vitals: 97.9, 20, 100, 117/68 & SpO2 62% on RA Significant labs: (Labs/ Imaging personally reviewed) I, Domingo Pulse [place], AGACNP-BC, personally viewed and interpreted this ECG. EKG Interpretation: Date: 02/23/22, EKG Time: 16:27, Rate: 98, Rhythm: NSR, QRS Axis:  borderline RAD, Intervals: normal, ST/T Wave abnormalities: inferior T wave inversions, Narrative Interpretation: NSR with borderline RAD and probable LAE with inferior T wave inversions Chemistry: Na+: 139, K+: 3.8, BUN/Cr.: 31/0.79, Serum CO2/ AG: 32/8 Hematology: WBC: 6.9, Hgb: 18.9, Hct: 63.3, plt: 178 BNP: 283.7, PCT: 0.22, COVID-19 & Influenza A/B: negative ABG: 7.25/ 99/ 59/ 43.4 CXR 02/23/22: pulmonary hypoinflation. Central pulmonary vascular engorgement without overt pulmonary edema. Stable tubes CT 02/23/22: no evidence of pulmonary embolism. Mild cardiomegaly, CAD, main pulmonary trunk is dilated measuring 3.9 cm suggesting pulmonary arterial hypertension. Mild bibasilar atelectasis. No evidence of pulmonary edema or pleural effusion. CT head wo contrast 02/23/22: pending  PCCM consulted due to worsening acute on chronic hypoxic/hypercapnic respiratory failure requiring emergent intubation and mechanical ventilatory support.  Pertinent  Medical History  T2DM COPD Morbid obesity Tobacco abuse (cigars, 40 pack year history) Secondary Polycythemia HFpEF  Micro Data:  8/19: SARS-CoV-2 & Influenza PCR>>negative 8/19: RVP>> Rhinovirus/Enterovirus 8/19: MRSA PCR>> negative 8/20: Tracheal aspirate>>normal flora  Antimicrobials:  Ceftriaxone  8/19>>8/23 Doxycycline 8/19>>8/23  Significant Hospital Events: Including procedures, antibiotic start and stop dates in addition to other pertinent events   02/23/22: Admit to ICU with worsening acute on chronic hypoxic/hypercapnic respiratory failure s/t suspected COPD exacerbation in the setting of HFpEF and possible pulmonary hypertension requiring emergent intubation and mechanical ventilatory support. 02/24/22: Pt remains mechanically intubated settings decreased: PEEP 5/FiO2 40%.   02/25/22: Increased vent requirements this morning due to diffuse pulmonary vascular congestion, received lasix. Tracheal aspirate with gram + cocci & rods, gram - rods.  Severe Delirium upon WUA. 02/26/22: Remains on vent, 60% fio2 & 8 PEEP.  Diurese with Diamox + Metolazone. 02/27/22: No acute events overnight; remains mechanically intubated current vent settings: 40%/8 PEEP  02/28/22: Pts O2 requirements increased overnight due to hypoxia vent settings: FiO2 50%/PEEP 8 unable to perform SBT  03/01/22: Overnight with hypoxia, concern for ? Mucus plug.  Vent settings this am 100% FiO2 & 8 PEEP.  Plan for Diuresis, considering Bronchoscopy 03/02/22: Continued high vent settings (100%, 12 PEEP), start recruitment maneuvers, considering bronch.  Diurese.  Interim History / Subjective:  -Overnight again with episode of hypoxia, required bagging ~ this morning requiring vent settings: 100% FiO2 and 12 PEEP with O2 sats 90-92% -CXR with improving pulmonary edema ~ will diurese again  -Will increase PEEP for FiO2 table, start recruitment maneuvers -May need bronchoscopy ~ will discuss with Dr. Merrilee Jansky -Afebrile, no vasopressors -WBC normal, Creatinine normal, UOP1.9 L past 24 hrs (net+ 1.8L)  Objective   Blood pressure 107/66, pulse 76, temperature (!) 97.5 F (36.4 C), resp. rate 16, height 5' 8"  (1.727 m), weight (!) 143.4 kg, SpO2 90 %.    Vent Mode: PRVC FiO2 (%):  [70 %-100 %] 100 % Set Rate:  [16 bmp] 16 bmp Vt  Set:  [550 mL] 550 mL PEEP:  [10 ZOX09-60 cmH20] 12 cmH20 Plateau Pressure:  [18 cmH20] 18 cmH20   Intake/Output Summary (Last 24 hours) at 03/02/2022 1013 Last data filed at 03/02/2022 4540 Gross per 24 hour  Intake 1811.07 ml  Output 1950 ml  Net -138.93 ml    Filed Weights   02/28/22 0431 03/01/22 0430 03/02/22 0423  Weight: (!) 144.2 kg (!) 144.3 kg (!) 143.4 kg    Examination: General: Acutely on chronically-ill appearing male resting in bed, NAD mechanically intubated  HEENT: Supple, no JVD  Neuro: RASS: -3 (requiring deeper sedation due to high vent requirements), unable to follow commands, PERRL CV: NSR, rrr, no r/g, 2+ radial/1+ distal pulses, 1+ bilateral lower extremity edema  Pulm: Coarse throughout, even, non labored, synchronous with vent GI: +BS x4, obese, soft, non distended  GU: Foley in place with clear yellow urine Skin: Scattered papules on chest, axillae and groin- possible heat rash Extremities: Normal bulk and tone  Resolved Hospital Problem list   Hypotension secondary to possible sepsis vs sedation related   Assessment & Plan:   Acute on Chronic Hypoxic / Hypercapnic Respiratory Failure suspect multifocal in the setting of suspected COPD exacerbation and decompensated HFpEF and rhinovirus Tobacco Abuse PMHx: COPD, morbid obesity, tobacco abuse, HFpEF - Full vent support, implement lung protective strategies - Plateau pressures less than 30 cm H20 - Wean FiO2 & PEEP as tolerated to maintain O2 sats 88 to 92% - Follow intermittent Chest X-ray & ABG as needed - Spontaneous Breathing Trials when respiratory parameters met and mental status permits -VAP Bundle -Bronchodilators & Pulmicort nebs -Completed course of ABX as above -Diuresis as BP and renal function  permits ~ will give 40 mg IV Lasix 8/26 -Discussed with Dr. Merrilee Jansky, considering bronchoscopy 8/26  Acute on Chronic HFpEF exacerbation Mildly elevated Troponin, suspect demand ischemia Query  Pulmonary HTN PMHx: HFpEF  Echocardiogram 02/24/22: LVEF 60-65%, Grade II DD - Continuous  telemetry monitoring - Maintain MAP >65 - Vasopressors as needed to maintain MAP goal ~ currently not requiring - Trend HS Troponin until downtrending (59 ~ 71 ~ 143 ~ 221 ~) - Diurese as BP and renal function permits to keep net negative: will give a dose of Lasix 8/26  Rhinovirus - Trend WBC and monitor fever curve  - Completed course of abx therapy due to suspected bacterial pneumonia, however tracheal aspirate with normal respiratory flora  Polycythemia suspected secondary to chronic hypoxia ~improving  Followed by oncology outpatient, last seen 10/2019. He was offered a sleep study which he declined. At that time, he was deemed not a candidate for continued follow up as Hgb had stabilized at 17.0 & HCT was 51%.  Hgb: 18.9, Hct: 63.3 - Daily CBC - Spoke with on call oncology NP on 08/20: no interventions required at this time due to CTA Chest negative for PE and pts noncompliance with outpatient oxygen has likely worsened polycythemia.  Once pts oxygenation improves this will likely improve polycythemia pt can follow-up with hematology in outpatient setting.  Official hematology consult canceled   Type 2 Diabetes Mellitus Hemoglobin A1C: 6.5 - Monitor CBG Q 4 hours - SSI resistant dosing - Target range while in ICU: 140-180 - Follow ICU hyper/hypo-glycemia protocol  Acute Metabolic Encephalopathy Sedation needs in the setting of mechanical ventilation  CT Head negative 8/19 - Treatment of metabolic derangements as outlined above - Maintain a RASS goal of 0 to -1 - Fentanyl and Propofol to maintain RASS goal - Daily wake up assessment   Best Practice (right click and "Reselect all SmartList Selections" daily)  Diet/type: NPO w/ meds via tube; tube feeds DVT prophylaxis: Arixtra GI prophylaxis: PPI Lines: N/A Foley:  Yes, and it is still needed Code Status:  full code Last date of  multidisciplinary goals of care discussion [03/01/22]   Labs   CBC: Recent Labs  Lab 02/24/22 1007 02/25/22 0609 02/26/22 0459 02/27/22 0449 02/28/22 0324 03/01/22 0556 03/02/22 0311  WBC 9.3 7.4 5.5 5.6 5.4 6.6 6.4  NEUTROABS 7.9* 5.6  --   --   --   --   --   HGB 18.6* 19.1* 17.8* 17.7* 17.6* 17.3* 16.7  HCT 61.8* 63.8* 60.2* 59.2* 58.6* 57.8* 55.6*  MCV 94.5 93.7 93.3 92.1 91.8 93.2 91.6  PLT 186 163 142* 141* 154 180 206     Basic Metabolic Panel: Recent Labs  Lab 02/26/22 0459 02/27/22 0449 02/28/22 0324 03/01/22 0556 03/02/22 0311  NA 141 140 138 136 136  K 3.7 3.6 3.6 3.7 3.7  CL 101 102 101 97* 99  CO2 34* 32 30 31 32  GLUCOSE 99 109* 108* 134* 147*  BUN 24* 24* 22* 29* 35*  CREATININE 0.59* 0.55* 0.53* 0.63 0.78  CALCIUM 8.1* 8.5* 8.3* 8.4* 8.7*  MG 2.0 2.2 2.1 2.4 2.5*  PHOS 4.3 3.7 3.8 4.1 3.6    GFR: Estimated Creatinine Clearance: 138.4 mL/min (by C-G formula based on SCr of 0.78 mg/dL). Recent Labs  Lab 02/23/22 1741 02/24/22 0531 02/24/22 1007 02/25/22 6415 02/26/22 0459 02/27/22 0449 02/28/22 0324 03/01/22 0556 03/02/22 0311  PROCALCITON 0.22 0.18  --  <0.10  --   --   --   --   --  WBC 6.9 7.6   < > 7.4   < > 5.6 5.4 6.6 6.4   < > = values in this interval not displayed.     Liver Function Tests: No results for input(s): "AST", "ALT", "ALKPHOS", "BILITOT", "PROT", "ALBUMIN" in the last 168 hours. No results for input(s): "LIPASE", "AMYLASE" in the last 168 hours. No results for input(s): "AMMONIA" in the last 168 hours.  ABG    Component Value Date/Time   PHART 7.37 03/02/2022 0311   PCO2ART 65 (H) 03/02/2022 0311   PO2ART 95 03/02/2022 0311   HCO3 37.6 (H) 03/02/2022 0311   TCO2 39 (H) 01/13/2018 0006   O2SAT 97.9 03/02/2022 0311     Coagulation Profile: No results for input(s): "INR", "PROTIME" in the last 168 hours.  Cardiac Enzymes: No results for input(s): "CKTOTAL", "CKMB", "CKMBINDEX", "TROPONINI" in the last  168 hours.  HbA1C: Hemoglobin A1C  Date/Time Value Ref Range Status  02/13/2018 01:52 PM 7.5 (A) 4.0 - 5.6 % Final   Hgb A1c MFr Bld  Date/Time Value Ref Range Status  02/23/2022 10:05 PM 6.5 (H) 4.8 - 5.6 % Final    Comment:    (NOTE) Pre diabetes:          5.7%-6.4%  Diabetes:              >6.4%  Glycemic control for   <7.0% adults with diabetes   01/13/2018 09:53 AM 7.3 (H) 4.8 - 5.6 % Final    Comment:    (NOTE) Pre diabetes:          5.7%-6.4% Diabetes:              >6.4% Glycemic control for   <7.0% adults with diabetes     CBG: Recent Labs  Lab 03/01/22 1600 03/01/22 2001 03/01/22 2353 03/02/22 0411 03/02/22 0817  GLUCAP 159* 148* 155* 189* 167*     Review of Systems:   UTA-patient intubated and sedated  Past Medical History:  He,  has a past medical history of (HFpEF) heart failure with preserved ejection fraction ([place]), Allergic reaction (12/15/2016), COPD (chronic obstructive pulmonary disease) ([place]), Hyperglycemia (12/2016), Hypertension, and Polycythemia.   Surgical History:   Past Surgical History:  Procedure Laterality Date   KNEE SURGERY Right      Social History:   reports that he has been smoking cigars. He has never used smokeless tobacco. He reports current alcohol use. He reports that he does not use drugs.   Family History:  His family history includes CAD in his brother and father.   Allergies Allergies  Allergen Reactions   Other Anaphylaxis    McCormick's Rub (Food)   Beef-Derived Products Itching    Alpha gal allergy   Pork-Derived Products Hives    Alpha gal     Home Medications  Prior to Admission medications   Medication Sig Start Date End Date Taking? Authorizing Provider  albuterol (PROVENTIL HFA;VENTOLIN HFA) 108 (90 Base) MCG/ACT inhaler Inhale 2 puffs into the lungs every 6 (six) hours as needed for wheezing or shortness of breath. 02/03/18   Azzie Glatter, FNP  aspirin 81 MG tablet Take 81 mg by mouth  daily.    [provider]  blood glucose meter kit and supplies Dispense based on patient and insurance preference. Use up to four times daily as directed. (FOR ICD-10 E10.9, E11.9). 01/15/18   Eugenie Filler, MD  buPROPion (WELLBUTRIN SR) 150 MG 12 hr tablet Take 150 mg by mouth 2 (  two) times daily.    [provider]  diphenhydrAMINE (BENADRYL) 25 MG tablet Take 1 tablet (25 mg total) by mouth every 6 (six) hours. Patient taking differently: Take 25 mg by mouth daily.  09/07/17   Jola Schmidt, MD  fluticasone (FLONASE) 50 MCG/ACT nasal spray Place 2 sprays into both nostrils daily. 01/16/18   Eugenie Filler, MD  folic acid (FOLVITE) 1 MG tablet Take 1 tablet (1 mg total) by mouth daily. Patient not taking: Reported on 02/03/2018 01/16/18   Eugenie Filler, MD  furosemide (LASIX) 40 MG tablet Take 1 tablet (40 mg total) by mouth daily. 02/03/18   Azzie Glatter, FNP  meloxicam (MOBIC) 15 MG tablet Take 15 mg by mouth daily.    [provider]  phentermine 15 MG capsule Take 15 mg by mouth every morning.    [provider]  traMADol (ULTRAM) 50 MG tablet Take 1 tablet (50 mg total) by mouth every 8 (eight) hours as needed. 02/13/18   Azzie Glatter, FNP  umeclidinium bromide (INCRUSE ELLIPTA) 62.5 MCG/INH AEPB Inhale 1 puff into the lungs daily. Patient not taking: Reported on 07/28/2019 02/11/18   Azzie Glatter, FNP     Critical care time: 40 minutes    Darel Hong, AGACNP-BC [facility] Prefer epic messenger for cross cover needs If after hours, please call E-link

## 2022-03-02 NOTE — Progress Notes (Signed)
Note regarding 0500 ABG Vent Vt was actually 550 not 500 as reported on ABG

## 2022-03-02 NOTE — Consult Note (Signed)
PHARMACY CONSULT NOTE  Pharmacy Consult for Electrolyte Monitoring and Replacement   Recent Labs: Potassium (mmol/L)  Date Value  03/02/2022 3.7  04/21/2012 4.8   Magnesium (mg/dL)  Date Value  05/09/1116 2.5 (H)   Calcium (mg/dL)  Date Value  35/67/0141 8.7 (L)   Calcium, Total (mg/dL)  Date Value  09/05/3141 8.8   Albumin (g/dL)  Date Value  88/87/5797 4.1  04/21/2012 4.0   Phosphorus (mg/dL)  Date Value  28/20/6015 3.6   Sodium (mmol/L)  Date Value  03/02/2022 136  04/21/2012 139    Assessment: Pharmacy has been consulted to monitor electrolytes in 60yo male presenting to the ED complaining of worsening bilateral leg swelling and pain. He was self-treating with mucinex and took a COVID test which was negative. He is currently intubated and sedated in CCU. Pharmacy is asked to follow and replace electrolytes while in CCU  Diuretics: furosemide 40 mg IV x 1  On free water 100 ml q4H  Goal of Therapy:  Electrolytes WNL  Plan:  No replacement needed at this time.  Will recheck electrolytes with AM labs   Ronnald Ramp ,PharmD Clinical Pharmacist 03/02/2022 10:02 AM

## 2022-03-03 ENCOUNTER — Inpatient Hospital Stay: Payer: Medicaid Other

## 2022-03-03 LAB — BASIC METABOLIC PANEL
Anion gap: 8 (ref 5–15)
BUN: 36 mg/dL — ABNORMAL HIGH (ref 6–20)
CO2: 35 mmol/L — ABNORMAL HIGH (ref 22–32)
Calcium: 9.1 mg/dL (ref 8.9–10.3)
Chloride: 96 mmol/L — ABNORMAL LOW (ref 98–111)
Creatinine, Ser: 0.66 mg/dL (ref 0.61–1.24)
GFR, Estimated: >60 mL/min (ref >60)
Glucose, Bld: 114 mg/dL — ABNORMAL HIGH (ref 70–99)
Potassium: 3.3 mmol/L — ABNORMAL LOW (ref 3.5–5.1)
Sodium: 139 mmol/L (ref 135–145)

## 2022-03-03 LAB — GLUCOSE, CAPILLARY
Glucose-Capillary: 126 mg/dL — ABNORMAL HIGH (ref 70–99)
Glucose-Capillary: 125 mg/dL — ABNORMAL HIGH (ref 70–99)
Glucose-Capillary: 175 mg/dL — ABNORMAL HIGH (ref 70–99)
Glucose-Capillary: 211 mg/dL — ABNORMAL HIGH (ref 70–99)
Glucose-Capillary: 199 mg/dL — ABNORMAL HIGH (ref 70–99)
Glucose-Capillary: 171 mg/dL — ABNORMAL HIGH (ref 70–99)

## 2022-03-03 LAB — CBC
HCT: 56.7 % — ABNORMAL HIGH (ref 39.0–52.0)
Hemoglobin: 17.3 g/dL — ABNORMAL HIGH (ref 13.0–17.0)
MCH: 28 pg (ref 26.0–34.0)
MCHC: 30.5 g/dL (ref 30.0–36.0)
MCV: 91.7 fL (ref 80.0–100.0)
Platelets: 235 10*3/uL (ref 150–400)
RBC: 6.18 MIL/uL — ABNORMAL HIGH (ref 4.22–5.81)
RDW: 17.6 % — ABNORMAL HIGH (ref 11.5–15.5)
WBC: 5.3 10*3/uL (ref 4.0–10.5)
nRBC: 0 % (ref 0.0–0.2)

## 2022-03-03 LAB — BLOOD GAS, ARTERIAL
Acid-Base Excess: 18.3 mmol/L — ABNORMAL HIGH (ref 0.0–2.0)
Acid-Base Excess: 15.5 mmol/L — ABNORMAL HIGH (ref 0.0–2.0)
Bicarbonate: 46 mmol/L — ABNORMAL HIGH (ref 20.0–28.0)
Bicarbonate: 42.8 mmol/L — ABNORMAL HIGH (ref 20.0–28.0)
FIO2: 100 %
FIO2: 80 %
Mechanical Rate: 16
O2 Saturation: 100 %
O2 Saturation: 100 %
PEEP: 15 cmH2O
PEEP: 15 cmH2O
Patient temperature: 37
Patient temperature: 37
Pressure control: 15 cmH2O
Pressure control: 15 cmH2O
RATE: 16 resp/min
RATE: 16 resp/min
pCO2 arterial: 59 mmHg — ABNORMAL HIGH (ref 32–48)
pCO2 arterial: 63 mmHg — ABNORMAL HIGH (ref 32–48)
pH, Arterial: 7.5 — ABNORMAL HIGH (ref 7.35–7.45)
pH, Arterial: 7.44 (ref 7.35–7.45)
pO2, Arterial: 150 mmHg — ABNORMAL HIGH (ref 83–108)
pO2, Arterial: 252 mmHg — ABNORMAL HIGH (ref 83–108)

## 2022-03-03 LAB — PHOSPHORUS: Phosphorus: 3 mg/dL (ref 2.5–4.6)

## 2022-03-03 LAB — PROCALCITONIN: Procalcitonin: 0.17 ng/mL

## 2022-03-03 LAB — MAGNESIUM: Magnesium: 1.9 mg/dL (ref 1.7–2.4)

## 2022-03-03 MED ORDER — VECURONIUM BROMIDE 10 MG IV SOLR
10.0000 mg | INTRAVENOUS | Status: DC | PRN
  Administered 2022-03-03 – 2022-03-04 (×2): 10 mg via INTRAVENOUS
  Filled 2022-03-03 (×2): qty 10

## 2022-03-03 MED ORDER — ADULT MULTIVITAMIN W/MINERALS CH
1.0000 | ORAL_TABLET | Freq: Every day | ORAL | Status: DC
  Administered 2022-03-03 – 2022-03-05 (×3): 1
  Filled 2022-03-03 (×3): qty 1

## 2022-03-03 MED ORDER — POTASSIUM CHLORIDE 20 MEQ PO PACK
40.0000 meq | PACK | Freq: Once | ORAL | Status: AC
  Administered 2022-03-03: 40 meq
  Filled 2022-03-03: qty 2

## 2022-03-03 MED ORDER — POTASSIUM CHLORIDE 10 MEQ/100ML IV SOLN
10.0000 meq | INTRAVENOUS | Status: DC
  Administered 2022-03-03 (×2): 10 meq via INTRAVENOUS
  Filled 2022-03-03 (×4): qty 100

## 2022-03-03 MED ORDER — METHYLPREDNISOLONE SODIUM SUCC 40 MG IJ SOLR
40.0000 mg | Freq: Every day | INTRAMUSCULAR | Status: DC
  Administered 2022-03-03 – 2022-03-09 (×7): 40 mg via INTRAVENOUS
  Filled 2022-03-03 (×7): qty 1

## 2022-03-03 MED ORDER — IOHEXOL 350 MG/ML SOLN
75.0000 mL | Freq: Once | INTRAVENOUS | Status: AC | PRN
  Administered 2022-03-03: 75 mL via INTRAVENOUS

## 2022-03-03 MED ORDER — FUROSEMIDE 10 MG/ML IJ SOLN
40.0000 mg | Freq: Once | INTRAMUSCULAR | Status: AC
  Administered 2022-03-03: 40 mg via INTRAVENOUS
  Filled 2022-03-03: qty 4

## 2022-03-03 MED ORDER — NOREPINEPHRINE 16 MG/250ML-% IV SOLN
0.0000 ug/min | INTRAVENOUS | Status: DC
  Filled 2022-03-03: qty 250

## 2022-03-03 NOTE — Progress Notes (Signed)
Recruitment maneuver performed.

## 2022-03-03 NOTE — Progress Notes (Signed)
Patient placed in prone position at approximately 1615 with RT, MD, NP and multiple support staff at bedside. Patient tolerated well with no complications.

## 2022-03-03 NOTE — Progress Notes (Signed)
Recruitment maneuver completed.

## 2022-03-03 NOTE — Procedures (Signed)
Arterial Catheter Insertion Procedure Note  Bryan Mitchell  [id number]  13-Nov-1960  Date:03/03/22  Time:2:48 PM    Provider Performing: Judithe Modest    Procedure: Insertion of Arterial Line (39030) with US guidance (09233)   Indication(s) Blood pressure monitoring and/or need for frequent ABGs  Consent Risks of the procedure as well as the alternatives and risks of each were explained to the patient and/or caregiver.  Consent for the procedure was obtained and is signed in the bedside chart  Anesthesia None   Time Out Verified patient identification, verified procedure, site/side was marked, verified correct patient position, special equipment/implants available, medications/allergies/relevant history reviewed, required imaging and test results available.   Sterile Technique Maximal sterile technique including full sterile barrier drape, hand hygiene, sterile gown, sterile gloves, mask, hair covering, sterile ultrasound probe cover (if used).   Procedure Description Area of catheter insertion was cleaned with chlorhexidine and draped in sterile fashion. With real-time ultrasound guidance an arterial catheter was placed into the right radial artery.  Appropriate arterial tracings confirmed on monitor.     Complications/Tolerance None; patient tolerated the procedure well.   EBL Minimal   Specimen(s) None    [name], AGACNP-BC [facility] Prefer epic messenger for cross cover needs If after hours, please call E-link

## 2022-03-03 NOTE — Procedures (Signed)
Central Venous Catheter Insertion Procedure Note  Bryan Mitchell  [id number]  02/15/1961  Date:03/03/22  Time:2:46 PM   Provider Performing:Janalee Grobe D Elvina Sidle   Procedure: Insertion of Non-tunneled Central Venous [id number]) with US guidance (59163)   Indication(s) Medication administration and Difficult access  Consent Risks of the procedure as well as the alternatives and risks of each were explained to the patient and/or caregiver.  Consent for the procedure was obtained and is signed in the bedside chart  Anesthesia Topical only with 1% lidocaine   Timeout Verified patient identification, verified procedure, site/side was marked, verified correct patient position, special equipment/implants available, medications/allergies/relevant history reviewed, required imaging and test results available.  Sterile Technique Maximal sterile technique including full sterile barrier drape, hand hygiene, sterile gown, sterile gloves, mask, hair covering, sterile ultrasound probe cover (if used).  Procedure Description Area of catheter insertion was cleaned with chlorhexidine and draped in sterile fashion.  With real-time ultrasound guidance a central venous catheter was placed into the left internal jugular vein. Nonpulsatile blood flow and easy flushing noted in all ports.  The catheter was sutured in place and sterile dressing applied.  Complications/Tolerance None; patient tolerated the procedure well. Chest X-ray is ordered to verify placement for internal jugular or subclavian cannulation.   Chest x-ray is not ordered for femoral cannulation.  EBL Minimal  Specimen(s) None   Line secured at the 20 cm mark. BIOPATCH applied to the insertion site.   [name], AGACNP-BC [facility] Prefer epic messenger for cross cover needs If after hours, please call E-link

## 2022-03-03 NOTE — Progress Notes (Addendum)
NAME:  Bryan Mitchell, MRN:  [id number], DOB:  [birth year], LOS: 8 ADMISSION DATE:  02/23/2022, CONSULTATION DATE:  02/23/22 REFERRING MD:  Dr. Toy Cookey, CHIEF COMPLAINT:  SOB and fatigue   History of Present Illness:  61 yo M presenting to [facility] ED on 02/23/22 from home in a wheelchair with his sister for evaluation of worsening fatigue, poor appetite and SOB over the past 2 weeks. Per ED documentation, patient also complained of worsening bilateral leg swelling and pain.  Sister interviewed over the telephone providing supplemental history. She described that he lives alone, and began not feeling good for about 2 weeks with complaints of dyspnea and fatigue. He self-treated with Mucinex and took a COVID test which was negative. He started having increased congestion and poor PO intake. On the night of 8/18 the sister spoke with the patient on the phone and he told her, "I feel like I'm dying". Today on 8/19 she convinced the patient to go to the hospital to be evaluated. She reports he does not trust doctors and has difficulty following outpatient medication regimens. He was prescribed home O2, and has oxygen tanks at the house but has not used them in 2 years per his sister. He also injured his RIGHT knee a few weeks ago and had been getting around with a crutch. She reported he had been a little more confused on 8/19.  Per ED documentation, he endorsed a productive cough, but denied fever/chills. Patient admitted to not taking his Lasix when interviewed by [facility] service, per their documentation. And he has noticed increased pedal edema recently.  ED course: Upon arrival in triage the patient was profoundly hypoxic with his SpO2 was in the 60's on RA, this initially recovered to 99% on 3 L [place]. ABG revealed partially compensated hypercapnia and he was placed on BIPAP. He received Lasix for diuresis, BNP was mildly elevated. CT angio negative for pulmonary edema/effusions, negative for PE. [facility] service  consulted for admission. After a couple of hours on BIPAP, patient became combative and agitated, ripping off the BIPAP mask. Patient emergently intubated in ED. PCCM consulted. Medications given: IV contrast, Duo-nebs, lasix, fondaparinux, doxycycline, fentanyl, versed, ketamine, rocuronium & succinylcholine for intubation, solumedrol, propofol drip started Initial Vitals: 97.9, 20, 100, 117/68 & SpO2 62% on RA Significant labs: (Labs/ Imaging personally reviewed) I, Domingo Pulse Rust[place]ster, AGACNP-BC, personally viewed and interpreted this ECG. EKG Interpretation: Date: 02/23/22, EKG Time: 16:27, Rate: 98, Rhythm: NSR, QRS Axis:  borderline RAD, Intervals: normal, ST/T Wave abnormalities: inferior T wave inversions, Narrative Interpretation: NSR with borderline RAD and probable LAE with inferior T wave inversions Chemistry: Na+: 139, K+: 3.8, BUN/Cr.: 31/0.79, Serum CO2/ AG: 32/8 Hematology: WBC: 6.9, Hgb: 18.9, Hct: 63.3, plt: 178 BNP: 283.7, PCT: 0.22, COVID-19 & Influenza A/B: negative ABG: 7.25/ 99/ 59/ 43.4 CXR 02/23/22: pulmonary hypoinflation. Central pulmonary vascular engorgement without overt pulmonary edema. Stable tubes CT 02/23/22: no evidence of pulmonary embolism. Mild cardiomegaly, CAD, main pulmonary trunk is dilated measuring 3.9 cm suggesting pulmonary arterial hypertension. Mild bibasilar atelectasis. No evidence of pulmonary edema or pleural effusion. CT head wo contrast 02/23/22: pending  PCCM consulted due to worsening acute on chronic hypoxic/hypercapnic respiratory failure requiring emergent intubation and mechanical ventilatory support.  Pertinent  Medical History  T2DM COPD Morbid obesity Tobacco abuse (cigars, 40 pack year history) Secondary Polycythemia HFpEF  Micro Data:  8/19: SARS-CoV-2 & Influenza PCR>>negative 8/19: RVP>> Rhinovirus/Enterovirus 8/19: MRSA PCR>> negative 8/20: Tracheal aspirate>>normal flora  Antimicrobials:  Ceftriaxone  8/19>>8/23 Doxycycline 8/19>>8/23  Significant Hospital Events: Including procedures, antibiotic start and stop dates in addition to other pertinent events   02/23/22: Admit to ICU with worsening acute on chronic hypoxic/hypercapnic respiratory failure s/t suspected COPD exacerbation in the setting of HFpEF and possible pulmonary hypertension requiring emergent intubation and mechanical ventilatory support. 02/24/22: Pt remains mechanically intubated settings decreased: PEEP 5/FiO2 40%.   02/25/22: Increased vent requirements this morning due to diffuse pulmonary vascular congestion, received lasix. Tracheal aspirate with gram + cocci & rods, gram - rods.  Severe Delirium upon WUA. 02/26/22: Remains on vent, 60% fio2 & 8 PEEP.  Diurese with Diamox + Metolazone. 02/27/22: No acute events overnight; remains mechanically intubated current vent settings: 40%/8 PEEP  02/28/22: Pts O2 requirements increased overnight due to hypoxia vent settings: FiO2 50%/PEEP 8 unable to perform SBT  03/01/22: Overnight with hypoxia, concern for ? Mucus plug.  Vent settings this am 100% FiO2 & 8 PEEP.  Plan for Diuresis, considering Bronchoscopy 03/02/22: Continued high vent settings (100%, 12 PEEP), start recruitment maneuvers, considering bronch.  Diurese. 03/03/22: Continued high vent settings (100% FiO2, 12 PEEP). Diuresing.  Obtain CT Angiogram Chest  Interim History / Subjective:  -No significant events noted overnight -Afebrile, hemodynamically stable, no vasopressors -Remains on high vent support: 100% fiO2, 12 PEEP ~ continue with recruitment maneuvers, increase PEEP table -NO reports of change of secretions from ETT -Wheezing today, will start steroids -Given lack of improvement in vent settings, will obtain CT Angiogram chest -WBC remains normal, Creatinine normal, UOP 2.7 L past 24 hrs (net+ 2.4L) `~diurese again today  Objective   Blood pressure (!) 139/39, pulse 83, temperature 98.6 F (37 C), resp. rate  20, height 5' 8"  (1.727 m), weight (!) 146.8 kg, SpO2 93 %.    Vent Mode: PRVC FiO2 (%):  [100 %] 100 % Set Rate:  [16 bmp] 16 bmp Vt Set:  [550 mL] 550 mL PEEP:  [12 cmH20] 12 cmH20 Plateau Pressure:  [20 cmH20-22 cmH20] 22 cmH20   Intake/Output Summary (Last 24 hours) at 03/03/2022 0842 Last data filed at 03/03/2022 0500 Gross per 24 hour  Intake 3348.6 ml  Output 2750 ml  Net 598.6 ml    Filed Weights   03/01/22 0430 03/02/22 0423 03/03/22 0358  Weight: (!) 144.3 kg (!) 143.4 kg (!) 146.8 kg    Examination: General: Acutely on chronically-ill appearing male resting in bed, NAD mechanically intubated  HEENT: Supple, no JVD  Neuro: RASS: -3 (requiring deeper sedation due to high vent requirements), unable to follow commands, PERRL CV: NSR, rrr, no r/g, 2+ radial/1+ distal pulses, 1+ bilateral lower extremity edema  Pulm: Coarse throughout, even, non labored, synchronous with vent GI: +BS x4, obese, soft, non distended  GU: Foley in place with yellow urine Skin: Scattered papules on chest, axillae and groin- possible heat rash Extremities: Normal bulk and tone  Resolved Hospital Problem list   Hypotension secondary to possible sepsis vs sedation related   Assessment & Plan:   Acute on Chronic Hypoxic / Hypercapnic Respiratory Failure suspect multifocal in the setting of suspected COPD exacerbation, decompensated HFpEF and rhinovirus Tobacco Abuse PMHx: COPD, morbid obesity, tobacco abuse, HFpEF - Full vent support, implement lung protective strategies - Plateau pressures less than 30 cm H20 - Wean FiO2 & PEEP as tolerated to maintain O2 sats 88 to 92% - Follow intermittent Chest X-ray & ABG as needed - Spontaneous Breathing Trials when respiratory parameters met and mental status permits -VAP Bundle -Recruitment  maneuvers -Bronchodilators & Pulmicort nebs -Start IV Steroids 8/27 -Completed course of ABX as above -Diuresis as BP and renal function permits ~ will give  40 mg IV Lasix 8/27 -Obtain CTA Chest 8/27  Acute on Chronic HFpEF exacerbation Mildly elevated Troponin, suspect demand ischemia Query Pulmonary HTN PMHx: HFpEF  Echocardiogram 02/24/22: LVEF 60-65%, Grade II DD - Continuous  telemetry monitoring - Maintain MAP >65 - Vasopressors as needed to maintain MAP goal ~ currently not requiring - Trend HS Troponin until downtrending (59 ~ 71 ~ 143 ~ 221 ~) - Diurese as BP and renal function permits to keep net negative: will give a dose of Lasix 8/27  Rhinovirus - Trend WBC and monitor fever curve  - Completed course of abx therapy due to suspected bacterial pneumonia, however tracheal aspirate with normal respiratory flora  Polycythemia suspected secondary to chronic hypoxia ~improving  Followed by oncology outpatient, last seen 10/2019. He was offered a sleep study which he declined. At that time, he was deemed not a candidate for continued follow up as Hgb had stabilized at 17.0 & HCT was 51%.  Hgb: 18.9, Hct: 63.3 - Daily CBC - Spoke with on call oncology NP on 08/20: no interventions required at this time due to CTA Chest negative for PE and pts noncompliance with outpatient oxygen has likely worsened polycythemia.  Once pts oxygenation improves this will likely improve polycythemia pt can follow-up with hematology in outpatient setting.  Official hematology consult canceled   Type 2 Diabetes Mellitus Hemoglobin A1C: 6.5 - Monitor CBG Q 4 hours - SSI resistant dosing - Target range while in ICU: 140-180 - Follow ICU hyper/hypo-glycemia protocol  Acute Metabolic Encephalopathy Sedation needs in the setting of mechanical ventilation  CT Head negative 8/19 - Treatment of metabolic derangements as outlined above - Maintain a RASS goal of 0 to -1 - Fentanyl and Propofol to maintain RASS goal - Daily wake up assessment   Pt is critically ill.  Prognosis is guarded.  High risk for cardiac arrest and death.  Palliative Care is following  for GOC.   Best Practice (right click and "Reselect all SmartList Selections" daily)  Diet/type: NPO w/ meds via tube; tube feeds DVT prophylaxis: Arixtra GI prophylaxis: PPI Lines: N/A Foley:  Yes, and it is still needed Code Status:  full code Last date of multidisciplinary goals of care discussion [03/01/22]  8/27: Updated pt's sister Butch Penny via conference call with sister-in-law at bedside.  Discussed that pt may require proning, and thus would need to insert central and arterial lines.  She gives consent for line placement if needed.   Labs   CBC: Recent Labs  Lab 02/24/22 1007 02/25/22 0609 02/26/22 0459 02/27/22 0449 02/28/22 0324 03/01/22 0556 03/02/22 0311 03/03/22 0447  WBC 9.3 7.4   < > 5.6 5.4 6.6 6.4 5.3  NEUTROABS 7.9* 5.6  --   --   --   --   --   --   HGB 18.6* 19.1*   < > 17.7* 17.6* 17.3* 16.7 17.3*  HCT 61.8* 63.8*   < > 59.2* 58.6* 57.8* 55.6* 56.7*  MCV 94.5 93.7   < > 92.1 91.8 93.2 91.6 91.7  PLT 186 163   < > 141* 154 180 206 235   < > = values in this interval not displayed.     Basic Metabolic Panel: Recent Labs  Lab 02/27/22 0449 02/28/22 0324 03/01/22 0556 03/02/22 0311 03/03/22 0447  NA 140 138 136 136 139  K 3.6 3.6 3.7 3.7 3.3*  CL 102 101 97* 99 96*  CO2 32 30 31 32 35*  GLUCOSE 109* 108* 134* 147* 114*  BUN 24* 22* 29* 35* 36*  CREATININE 0.55* 0.53* 0.63 0.78 0.66  CALCIUM 8.5* 8.3* 8.4* 8.7* 9.1  MG 2.2 2.1 2.4 2.5* 1.9  PHOS 3.7 3.8 4.1 3.6 3.0    GFR: Estimated Creatinine Clearance: 140.3 mL/min (by C-G formula based on SCr of 0.66 mg/dL). Recent Labs  Lab 02/25/22 0609 02/26/22 0459 02/28/22 0324 03/01/22 0556 03/02/22 0311 03/03/22 0447  PROCALCITON <0.10  --   --   --   --   --   WBC 7.4   < > 5.4 6.6 6.4 5.3   < > = values in this interval not displayed.     Liver Function Tests: No results for input(s): "AST", "ALT", "ALKPHOS", "BILITOT", "PROT", "ALBUMIN" in the last 168 hours. No results for input(s):  "LIPASE", "AMYLASE" in the last 168 hours. No results for input(s): "AMMONIA" in the last 168 hours.  ABG    Component Value Date/Time   PHART 7.37 03/02/2022 0311   PCO2ART 65 (H) 03/02/2022 0311   PO2ART 95 03/02/2022 0311   HCO3 37.6 (H) 03/02/2022 0311   TCO2 39 (H) 01/13/2018 0006   O2SAT 97.9 03/02/2022 0311     Coagulation Profile: No results for input(s): "INR", "PROTIME" in the last 168 hours.  Cardiac Enzymes: No results for input(s): "CKTOTAL", "CKMB", "CKMBINDEX", "TROPONINI" in the last 168 hours.  HbA1C: Hemoglobin A1C  Date/Time Value Ref Range Status  02/13/2018 01:52 PM 7.5 (A) 4.0 - 5.6 % Final   Hgb A1c MFr Bld  Date/Time Value Ref Range Status  02/23/2022 10:05 PM 6.5 (H) 4.8 - 5.6 % Final    Comment:    (NOTE) Pre diabetes:          5.7%-6.4%  Diabetes:              >6.4%  Glycemic control for   <7.0% adults with diabetes   01/13/2018 09:53 AM 7.3 (H) 4.8 - 5.6 % Final    Comment:    (NOTE) Pre diabetes:          5.7%-6.4% Diabetes:              >6.4% Glycemic control for   <7.0% adults with diabetes     CBG: Recent Labs  Lab 03/02/22 1612 03/02/22 2020 03/02/22 2346 03/03/22 0350 03/03/22 0727  GLUCAP 163* 111* 128* 126* 125*     Review of Systems:   UTA-patient intubated and sedated  Past Medical History:  He,  has a past medical history of (HFpEF) heart failure with preserved ejection fraction ([place]), Allergic reaction (12/15/2016), COPD (chronic obstructive pulmonary disease) ([place]), Hyperglycemia (12/2016), Hypertension, and Polycythemia.   Surgical History:   Past Surgical History:  Procedure Laterality Date   KNEE SURGERY Right      Social History:   reports that he has been smoking cigars. He has never used smokeless tobacco. He reports current alcohol use. He reports that he does not use drugs.   Family History:  His family history includes CAD in his brother and father.   Allergies Allergies  Allergen  Reactions   Other Anaphylaxis    McCormick's Rub (Food)   Beef-Derived Products Itching    Alpha gal allergy   Pork-Derived Products Hives    Alpha gal     Home Medications  Prior to Admission medications  Medication Sig Start Date End Date Taking? Authorizing Provider  albuterol (PROVENTIL HFA;VENTOLIN HFA) 108 (90 Base) MCG/ACT inhaler Inhale 2 puffs into the lungs every 6 (six) hours as needed for wheezing or shortness of breath. 02/03/18   Azzie Glatter, FNP  aspirin 81 MG tablet Take 81 mg by mouth daily.    [provider]  blood glucose meter kit and supplies Dispense based on patient and insurance preference. Use up to four times daily as directed. (FOR ICD-10 E10.9, E11.9). 01/15/18   Eugenie Filler, MD  buPROPion (WELLBUTRIN SR) 150 MG 12 hr tablet Take 150 mg by mouth 2 (two) times daily.    [provider]  diphenhydrAMINE (BENADRYL) 25 MG tablet Take 1 tablet (25 mg total) by mouth every 6 (six) hours. Patient taking differently: Take 25 mg by mouth daily.  09/07/17   Jola Schmidt, MD  fluticasone (FLONASE) 50 MCG/ACT nasal spray Place 2 sprays into both nostrils daily. 01/16/18   Eugenie Filler, MD  folic acid (FOLVITE) 1 MG tablet Take 1 tablet (1 mg total) by mouth daily. Patient not taking: Reported on 02/03/2018 01/16/18   Eugenie Filler, MD  furosemide (LASIX) 40 MG tablet Take 1 tablet (40 mg total) by mouth daily. 02/03/18   Azzie Glatter, FNP  meloxicam (MOBIC) 15 MG tablet Take 15 mg by mouth daily.    [provider]  phentermine 15 MG capsule Take 15 mg by mouth every morning.    [provider]  traMADol (ULTRAM) 50 MG tablet Take 1 tablet (50 mg total) by mouth every 8 (eight) hours as needed. 02/13/18   Azzie Glatter, FNP  umeclidinium bromide (INCRUSE ELLIPTA) 62.5 MCG/INH AEPB Inhale 1 puff into the lungs daily. Patient not taking: Reported on 07/28/2019 02/11/18   Azzie Glatter, FNP     Critical care  time: 40 minutes    Darel Hong, AGACNP-BC [facility] Prefer epic messenger for cross cover needs If after hours, please call E-link

## 2022-03-03 NOTE — Consult Note (Signed)
PHARMACY CONSULT NOTE  Pharmacy Consult for Electrolyte Monitoring and Replacement   Recent Labs: Potassium (mmol/L)  Date Value  03/03/2022 3.3 (L)  04/21/2012 4.8   Magnesium (mg/dL)  Date Value  36/14/4315 1.9   Calcium (mg/dL)  Date Value  40/02/6760 9.1   Calcium, Total (mg/dL)  Date Value  95/03/3266 8.8   Albumin (g/dL)  Date Value  12/45/8099 4.1  04/21/2012 4.0   Phosphorus (mg/dL)  Date Value  83/38/2505 3.0   Sodium (mmol/L)  Date Value  03/03/2022 139  04/21/2012 139   Assessment: Pharmacy has been consulted to monitor electrolytes in 61yo male presenting to the ED complaining of worsening bilateral leg swelling and pain. He was self-treating with mucinex and took a COVID test which was negative. He is currently intubated and sedated in CCU. Pharmacy is asked to follow and replace electrolytes while in CCU  Nutrition: Tube feeds  Goal of Therapy:  Electrolytes within normal limits  Plan:  --K 3.3, Kcl 40 mEq per tube x 1 and Kcl 10 mEq IV x 4 doses per PCCM --Follow-up electrolytes with AM labs tomorrow  Tressie Ellis 03/03/2022 7:09 AM

## 2022-03-03 NOTE — Progress Notes (Signed)
Recruitment Maneuver performed. Patient remains proned, head repositioned from left to right, tolerated well.

## 2022-03-04 ENCOUNTER — Inpatient Hospital Stay: Payer: Medicaid Other

## 2022-03-04 DIAGNOSIS — Z7189 Other specified counseling: Secondary | ICD-10-CM

## 2022-03-04 LAB — BLOOD GAS, ARTERIAL
Acid-Base Excess: 7.1 mmol/L — ABNORMAL HIGH (ref 0.0–2.0)
Bicarbonate: 30.1 mmol/L — ABNORMAL HIGH (ref 20.0–28.0)
FIO2: 75 %
Mechanical Rate: 16
O2 Saturation: 100 %
PEEP: 15 cmH2O
Patient temperature: 37
Pressure control: 15 cmH2O
pCO2 arterial: 36 mmHg (ref 32–48)
pH, Arterial: 7.53 — ABNORMAL HIGH (ref 7.35–7.45)
pO2, Arterial: 239 mmHg — ABNORMAL HIGH (ref 83–108)

## 2022-03-04 LAB — TROPONIN I (HIGH SENSITIVITY): Troponin I (High Sensitivity): 33 ng/L — ABNORMAL HIGH (ref <18)

## 2022-03-04 LAB — BASIC METABOLIC PANEL
Anion gap: 12 (ref 5–15)
BUN: 37 mg/dL — ABNORMAL HIGH (ref 6–20)
CO2: 34 mmol/L — ABNORMAL HIGH (ref 22–32)
Calcium: 9.1 mg/dL (ref 8.9–10.3)
Chloride: 95 mmol/L — ABNORMAL LOW (ref 98–111)
Creatinine, Ser: 0.66 mg/dL (ref 0.61–1.24)
GFR, Estimated: >60 mL/min (ref >60)
Glucose, Bld: 150 mg/dL — ABNORMAL HIGH (ref 70–99)
Potassium: 3.9 mmol/L (ref 3.5–5.1)
Sodium: 141 mmol/L (ref 135–145)

## 2022-03-04 LAB — CBC
HCT: 58.7 % — ABNORMAL HIGH (ref 39.0–52.0)
Hemoglobin: 17.5 g/dL — ABNORMAL HIGH (ref 13.0–17.0)
MCH: 27.3 pg (ref 26.0–34.0)
MCHC: 29.8 g/dL — ABNORMAL LOW (ref 30.0–36.0)
MCV: 91.7 fL (ref 80.0–100.0)
Platelets: 245 10*3/uL (ref 150–400)
RBC: 6.4 MIL/uL — ABNORMAL HIGH (ref 4.22–5.81)
RDW: 17.8 % — ABNORMAL HIGH (ref 11.5–15.5)
WBC: 5.9 10*3/uL (ref 4.0–10.5)
nRBC: 0 % (ref 0.0–0.2)

## 2022-03-04 LAB — GLUCOSE, CAPILLARY
Glucose-Capillary: 174 mg/dL — ABNORMAL HIGH (ref 70–99)
Glucose-Capillary: 172 mg/dL — ABNORMAL HIGH (ref 70–99)
Glucose-Capillary: 238 mg/dL — ABNORMAL HIGH (ref 70–99)
Glucose-Capillary: 274 mg/dL — ABNORMAL HIGH (ref 70–99)
Glucose-Capillary: 165 mg/dL — ABNORMAL HIGH (ref 70–99)
Glucose-Capillary: 151 mg/dL — ABNORMAL HIGH (ref 70–99)

## 2022-03-04 LAB — TRIGLYCERIDES: Triglycerides: 304 mg/dL — ABNORMAL HIGH (ref <150)

## 2022-03-04 LAB — PHOSPHORUS: Phosphorus: 3 mg/dL (ref 2.5–4.6)

## 2022-03-04 LAB — MAGNESIUM: Magnesium: 1.9 mg/dL (ref 1.7–2.4)

## 2022-03-04 MED ORDER — BISACODYL 10 MG RE SUPP
10.0000 mg | Freq: Every day | RECTAL | Status: DC | PRN
  Administered 2022-03-04: 10 mg via RECTAL
  Filled 2022-03-04: qty 1

## 2022-03-04 MED ORDER — ACETAZOLAMIDE SODIUM 500 MG IJ SOLR
500.0000 mg | Freq: Once | INTRAMUSCULAR | Status: AC
Start: 2022-03-04 — End: 2022-03-04
  Administered 2022-03-04: 500 mg via INTRAVENOUS
  Filled 2022-03-04: qty 500

## 2022-03-04 MED ORDER — INSULIN GLARGINE-YFGN 100 UNIT/ML ~~LOC~~ SOLN
10.0000 [IU] | Freq: Every day | SUBCUTANEOUS | Status: DC
  Administered 2022-03-04 – 2022-03-07 (×4): 10 [IU] via SUBCUTANEOUS
  Filled 2022-03-04 (×5): qty 0.1

## 2022-03-04 MED ORDER — INSULIN ASPART 100 UNIT/ML IJ SOLN
3.0000 [IU] | INTRAMUSCULAR | Status: DC
  Administered 2022-03-04 – 2022-03-08 (×21): 3 [IU] via SUBCUTANEOUS
  Filled 2022-03-04 (×20): qty 1

## 2022-03-04 MED ORDER — MIDAZOLAM-SODIUM CHLORIDE 100-0.9 MG/100ML-% IV SOLN
0.5000 mg/h | INTRAVENOUS | Status: DC
  Administered 2022-03-04: 0.5 mg/h via INTRAVENOUS
  Administered 2022-03-05: 2 mg/h via INTRAVENOUS
  Administered 2022-03-05: 5 mg/h via INTRAVENOUS
  Administered 2022-03-06 – 2022-03-10 (×4): 3 mg/h via INTRAVENOUS
  Filled 2022-03-04 (×7): qty 100

## 2022-03-04 MED ORDER — METOLAZONE 5 MG PO TABS
5.0000 mg | ORAL_TABLET | Freq: Once | ORAL | Status: AC
Start: 2022-03-04 — End: 2022-03-04
  Administered 2022-03-04: 5 mg
  Filled 2022-03-04: qty 1

## 2022-03-04 NOTE — Progress Notes (Signed)
Patient returned to Supine position from Prone position at approximately 11:15 am with no complications. Vital signs stable at this time.

## 2022-03-04 NOTE — Consult Note (Signed)
PHARMACY CONSULT NOTE  Pharmacy Consult for Electrolyte Monitoring and Replacement   Recent Labs: Potassium (mmol/L)  Date Value  03/04/2022 3.9  04/21/2012 4.8   Magnesium (mg/dL)  Date Value  49/70/2637 1.9   Calcium (mg/dL)  Date Value  85/88/5027 9.1   Calcium, Total (mg/dL)  Date Value  74/06/8785 8.8   Albumin (g/dL)  Date Value  76/72/0947 4.1  04/21/2012 4.0   Phosphorus (mg/dL)  Date Value  09/62/8366 3.0   Sodium (mmol/L)  Date Value  03/04/2022 141  04/21/2012 139   Assessment: Pharmacy has been consulted to monitor electrolytes in 60yo male presenting to the ED complaining of worsening bilateral leg swelling and pain. He was self-treating with mucinex and took a COVID test which was negative. He is currently intubated and sedated in CCU. Pharmacy is asked to follow and replace electrolytes while in CCU  Nutrition: Tube feeds @55ml /h (1.3L/d), FWF q4h (655mL/d)   Goal of Therapy:  Electrolytes within normal limits  Plan:  Net I/O +3.3L (UOP 0.8-0.50ml/k/h) K 3.3>3.9, after Kcl 40 mEq per tube x1 and Kcl 10 mEq IV x4 doses per PCCM. Currently WNL, no further repletion at this time. Other lytes remain WNL, will CTM with AM labs and adjust PRN. --Follow-up electrolytes with AM labs tomorrow  11m 03/04/2022 7:48 AM

## 2022-03-04 NOTE — Progress Notes (Signed)
RT note:  Patient rotated from prone to supine without complication.  ETT verified secured.  Recruitment maneuver performed after rotation.  SpO2 stable throughout.

## 2022-03-04 NOTE — Progress Notes (Addendum)
NAME:  Bryan Mitchell, MRN:  [id number], DOB:  Sep 20, 1960, LOS: 9 ADMISSION DATE:  02/23/2022, CONSULTATION DATE:  02/23/22 REFERRING MD:  Dr. Toy Cookey, CHIEF COMPLAINT:  SOB and fatigue   History of Present Illness:  61 yo M presenting to [facility] ED on 02/23/22 from home in a wheelchair with his sister for evaluation of worsening fatigue, poor appetite and SOB over the past 2 weeks. Per ED documentation, patient also complained of worsening bilateral leg swelling and pain.  Sister interviewed over the telephone providing supplemental history. She described that he lives alone, and began not feeling good for about 2 weeks with complaints of dyspnea and fatigue. He self-treated with Mucinex and took a COVID test which was negative. He started having increased congestion and poor PO intake. On the night of 8/18 the sister spoke with the patient on the phone and he told her, "I feel like I'm dying". Today on 8/19 she convinced the patient to go to the hospital to be evaluated. She reports he does not trust doctors and has difficulty following outpatient medication regimens. He was prescribed home O2, and has oxygen tanks at the house but has not used them in 2 years per his sister. He also injured his RIGHT knee a few weeks ago and had been getting around with a crutch. She reported he had been a little more confused on 8/19.  Per ED documentation, he endorsed a productive cough, but denied fever/chills. Patient admitted to not taking his Lasix when interviewed by [facility] service, per their documentation. And he has noticed increased pedal edema recently.  ED course: Upon arrival in triage the patient was profoundly hypoxic with his SpO2 was in the 60's on RA, this initially recovered to 99% on 3 L [place]. ABG revealed partially compensated hypercapnia and he was placed on BIPAP. He received Lasix for diuresis, BNP was mildly elevated. CT angio negative for pulmonary edema/effusions, negative for PE. [facility] service  consulted for admission. After a couple of hours on BIPAP, patient became combative and agitated, ripping off the BIPAP mask. Patient emergently intubated in ED. PCCM consulted. Medications given: IV contrast, Duo-nebs, lasix, fondaparinux, doxycycline, fentanyl, versed, ketamine, rocuronium & succinylcholine for intubation, solumedrol, propofol drip started Initial Vitals: 97.9, 20, 100, 117/68 & SpO2 62% on RA Significant labs: (Labs/ Imaging personally reviewed) I, Domingo Pulse Rust[place], AGACNP-BC, personally viewed and interpreted this ECG. EKG Interpretation: Date: 02/23/22, EKG Time: 16:27, Rate: 98, Rhythm: NSR, QRS Axis:  borderline RAD, Intervals: normal, ST/T Wave abnormalities: inferior T wave inversions, Narrative Interpretation: NSR with borderline RAD and probable LAE with inferior T wave inversions Chemistry: Na+: 139, K+: 3.8, BUN/Cr.: 31/0.79, Serum CO2/ AG: 32/8 Hematology: WBC: 6.9, Hgb: 18.9, Hct: 63.3, plt: 178 BNP: 283.7, PCT: 0.22, COVID-19 & Influenza A/B: negative ABG: 7.25/ 99/ 59/ 43.4 CXR 02/23/22: pulmonary hypoinflation. Central pulmonary vascular engorgement without overt pulmonary edema. Stable tubes CT 02/23/22: no evidence of pulmonary embolism. Mild cardiomegaly, CAD, main pulmonary trunk is dilated measuring 3.9 cm suggesting pulmonary arterial hypertension. Mild bibasilar atelectasis. No evidence of pulmonary edema or pleural effusion. CT head wo contrast 02/23/22: pending  PCCM consulted due to worsening acute on chronic hypoxic/hypercapnic respiratory failure requiring emergent intubation and mechanical ventilatory support.  Pertinent  Medical History  T2DM COPD Morbid obesity Tobacco abuse (cigars, 40 pack year history) Secondary Polycythemia HFpEF  Micro Data:  8/19: SARS-CoV-2 & Influenza PCR>>negative 8/19: RVP>> Rhinovirus/Enterovirus 8/19: MRSA PCR>> negative 8/20: Tracheal aspirate>>normal flora 8/28: Tracheal aspirate  Antimicrobials:   Ceftriaxone 8/19>>8/23 Doxycycline 8/19>>8/23  Significant Hospital Events: Including procedures, antibiotic start and stop dates in addition to other pertinent events   02/23/22: Admit to ICU with worsening acute on chronic hypoxic/hypercapnic respiratory failure s/t suspected COPD exacerbation in the setting of HFpEF and possible pulmonary hypertension requiring emergent intubation and mechanical ventilatory support. 02/24/22: Pt remains mechanically intubated settings decreased: PEEP 5/FiO2 40%.   02/25/22: Increased vent requirements this morning due to diffuse pulmonary vascular congestion, received lasix. Tracheal aspirate with gram + cocci & rods, gram - rods.  Severe Delirium upon WUA. 02/26/22: Remains on vent, 60% fio2 & 8 PEEP.  Diurese with Diamox + Metolazone. 02/27/22: No acute events overnight; remains mechanically intubated current vent settings: 40%/8 PEEP  02/28/22: Pts O2 requirements increased overnight due to hypoxia vent settings: FiO2 50%/PEEP 8 unable to perform SBT  03/01/22: Overnight with hypoxia, concern for ? Mucus plug.  Vent settings this am 100% FiO2 & 8 PEEP.  Plan for Diuresis, considering Bronchoscopy 03/02/22: Continued high vent settings (100%, 12 PEEP), start recruitment maneuvers, considering bronch.  Diurese. 03/03/22: Continued high vent settings (100% FiO2, 12 PEEP). Diuresing.  Obtain CT Angiogram Chest 03/03/22: CTA Chest revealed new bilateral lower lobe collapse or         consolidation since 02/23/2022. Very small superimposed          layering pleural effusions. No convincing acute pulmonary        edema. Suboptimal pulmonary artery contrast bolus timing. No          central pulmonary embolus. Satisfactory endotracheal and        enteric tubes. Aortic Atherosclerosis (ICD10-I70.0). 03/04/22: Pt currently proned and tolerating.  Remains on high ventilator settings: FiO2 80%/PEEP 15/PC O2 sats 99%  Interim History / Subjective:  As outlined above under  significant events   Objective   Blood pressure 138/77, pulse 81, temperature 97.7 F (36.5 C), temperature source Oral, resp. rate 16, height _0  (1.727 m), weight (!) 136.7 kg, SpO2 98 %.    Vent Mode: PCV FiO2 (%):  [80 %-100 %] 80 % Set Rate:  [16 bmp] 16 bmp Vt Set:  [550 mL] 550 mL PEEP:  [12 cmH20-15 cmH20] 15 cmH20 Plateau Pressure:  [29 cmH20] 29 cmH20   Intake/Output Summary (Last 24 hours) at 03/04/2022 0802 Last data filed at 03/04/2022 0600 Gross per 24 hour  Intake 2605.9 ml  Output 1900 ml  Net 705.9 ml   Filed Weights   03/02/22 0423 03/03/22 0358 03/04/22 0410  Weight: (!) 143.4 kg (!) 146.8 kg (!) 136.7 kg    Examination: General: Acutely on chronically-ill appearing male currently proned, NAD mechanically intubated  HEENT: Supple, unable to assess for JVD due to proned position  Neuro: RASS: -3 (requiring deeper sedation due to high vent requirements), unable to follow commands, PERRL CV: NSR, rrr, no r/g, 2+ radial/2+ distal pulses, trace bilateral lower extremity edema  Pulm: Diminished throughout, even, non labored, synchronous with vent GI: +BS x4, obese, soft, non distended  GU: Foley in place with yellow urine Skin: No rashes or lesions present on back  Extremities: Normal bulk and tone  Resolved Hospital Problem list   Hypotension secondary to possible sepsis vs sedation related   Assessment & Plan:   Acute on Chronic Hypoxic / Hypercapnic Respiratory Failure suspect multifocal in the setting of suspected COPD exacerbation, decompensated HFpEF and rhinovirus Tobacco Abuse PMHx: COPD, morbid obesity, tobacco abuse, HFpEF   CTA Chest 08/27:  new bilateral lower lobe collapse or consolidation; negative for central PE  - Full vent support, implement lung protective strategies - Plateau pressures less than 30 cm H20 - Wean FiO2 & PEEP as tolerated to maintain O2 sats 88 to 92% - Follow intermittent Chest X-ray & ABG as needed - Spontaneous  Breathing Trials when respiratory parameters met and mental status permits - VAP Bundle - Recruitment maneuvers - Bronchodilators & Pulmicort nebs - Continue iv steroids  - Continue proning as tolerated to improve VQ matching and recruit bases  - Completed course of ABX as above - Repeat tracheal aspirate   Acute on Chronic HFpEF exacerbation Mildly elevated Troponin, suspect demand ischemia Query Pulmonary HTN PMHx: HFpEF  Echocardiogram 02/24/22: LVEF 60-65%, Grade II DD - Continuous  telemetry monitoring - Maintain MAP >65 - Trend HS Troponin until downtrending (59 ~ 71 ~ 143 ~ 221 ~) - Diurese as BP and renal function permits to keep net negative: will give a dose of diamox and metolazone  Rhinovirus - Trend WBC and monitor fever curve  - Completed course of abx therapy due to suspected bacterial pneumonia, however tracheal aspirate with normal respiratory flora  Polycythemia suspected secondary to chronic hypoxia ~improving  Followed by oncology outpatient, last seen 10/2019. He was offered a sleep study which he declined. At that time, he was deemed not a candidate for continued follow up as Hgb had stabilized at 17.0 & HCT was 51%.  Hgb: 18.9, Hct: 63.3 - Daily CBC - Spoke with on call oncology NP on 08/20: no interventions required at this time due to CTA Chest negative for PE and pts noncompliance with outpatient oxygen has likely worsened polycythemia.  Once pts oxygenation improves this will likely improve polycythemia pt can follow-up with hematology in outpatient setting.  Official hematology consult canceled   Type 2 Diabetes Mellitus Hemoglobin A1C: 6.5 - Monitor CBG Q 4 hours - SSI resistant dosing - Target range while in ICU: 140-180 - Follow ICU hyper/hypo-glycemia protocol  Acute Metabolic Encephalopathy Sedation needs in the setting of mechanical ventilation  CT Head negative 8/19 - Treatment of metabolic derangements as outlined above - Maintain a RASS goal  of -3 while proned; when unproned RASS goal -1 - Will transition off propofol gtt to versed gtt due to elevated triglycerides to maintain RASS goal; continue valium per tube  - Daily wake up assessment   Pt is critically ill.  Prognosis is guarded.  High risk for cardiac arrest and death.  Palliative Care is following for GOC.  Best Practice (right click and "Reselect all SmartList Selections" daily)  Diet/type: NPO w/ meds via tube; tube feeds DVT prophylaxis: Arixtra GI prophylaxis: PPI Lines: Left radial arterial line; Left IJ central line  Foley:  Yes, and it is still needed Code Status:  full code Last date of multidisciplinary goals of care discussion [03/04/22]  Labs   CBC: Recent Labs  Lab 02/28/22 0324 03/01/22 0556 03/02/22 0311 03/03/22 0447 03/04/22 0500  WBC 5.4 6.6 6.4 5.3 5.9  HGB 17.6* 17.3* 16.7 17.3* 17.5*  HCT 58.6* 57.8* 55.6* 56.7* 58.7*  MCV 91.8 93.2 91.6 91.7 91.7  PLT 154 180 206 235 449    Basic Metabolic Panel: Recent Labs  Lab 02/28/22 0324 03/01/22 0556 03/02/22 0311 03/03/22 0447 03/04/22 0500  NA 138 136 136 139 141  K 3.6 3.7 3.7 3.3* 3.9  CL 101 97* 99 96* 95*  CO2 30 31 32 35* 34*  GLUCOSE 108* 134* 147*  114* 150*  BUN 22* 29* 35* 36* 37*  CREATININE 0.53* 0.63 0.78 0.66 0.66  CALCIUM 8.3* 8.4* 8.7* 9.1 9.1  MG 2.1 2.4 2.5* 1.9 1.9  PHOS 3.8 4.1 3.6 3.0 3.0   GFR: Estimated Creatinine Clearance: 134.6 mL/min (by C-G formula based on SCr of 0.66 mg/dL). Recent Labs  Lab 03/01/22 0556 03/02/22 0311 03/03/22 0447 03/04/22 0500  PROCALCITON  --   --  0.17  --   WBC 6.6 6.4 5.3 5.9    Liver Function Tests: No results for input(s): "AST", "ALT", "ALKPHOS", "BILITOT", "PROT", "ALBUMIN" in the last 168 hours. No results for input(s): "LIPASE", "AMYLASE" in the last 168 hours. No results for input(s): "AMMONIA" in the last 168 hours.  ABG    Component Value Date/Time   PHART 7.44 03/03/2022 2215   PCO2ART 63 (H)  03/03/2022 2215   PO2ART 252 (H) 03/03/2022 2215   HCO3 42.8 (H) 03/03/2022 2215   TCO2 39 (H) 01/13/2018 0006   O2SAT 100 03/03/2022 2215     Coagulation Profile: No results for input(s): "INR", "PROTIME" in the last 168 hours.  Cardiac Enzymes: No results for input(s): "CKTOTAL", "CKMB", "CKMBINDEX", "TROPONINI" in the last 168 hours.  HbA1C: Hemoglobin A1C  Date/Time Value Ref Range Status  02/13/2018 01:52 PM 7.5 (A) 4.0 - 5.6 % Final   Hgb A1c MFr Bld  Date/Time Value Ref Range Status  02/23/2022 10:05 PM 6.5 (H) 4.8 - 5.6 % Final    Comment:    (NOTE) Pre diabetes:          5.7%-6.4%  Diabetes:              >6.4%  Glycemic control for   <7.0% adults with diabetes   01/13/2018 09:53 AM 7.3 (H) 4.8 - 5.6 % Final    Comment:    (NOTE) Pre diabetes:          5.7%-6.4% Diabetes:              >6.4% Glycemic control for   <7.0% adults with diabetes     CBG: Recent Labs  Lab 03/03/22 1532 03/03/22 1946 03/03/22 2323 03/04/22 0346 03/04/22 0757  GLUCAP 211* 199* 171* 174* 172*    Review of Systems:   UTA-patient intubated and sedated  Past Medical History:  He,  has a past medical history of (HFpEF) heart failure with preserved ejection fraction ([place]), Allergic reaction (12/15/2016), COPD (chronic obstructive pulmonary disease) ([place]), Hyperglycemia (12/2016), Hypertension, and Polycythemia.   Surgical History:   Past Surgical History:  Procedure Laterality Date   KNEE SURGERY Right      Social History:   reports that he has been smoking cigars. He has never used smokeless tobacco. He reports current alcohol use. He reports that he does not use drugs.   Family History:  His family history includes CAD in his brother and father.   Allergies Allergies  Allergen Reactions   Other Anaphylaxis    McCormick's Rub (Food)   Beef-Derived Products Itching    Alpha gal allergy   Pork-Derived Products Hives    Alpha gal     Home Medications  Prior to  Admission medications   Medication Sig Start Date End Date Taking? Authorizing Provider  albuterol (PROVENTIL HFA;VENTOLIN HFA) 108 (90 Base) MCG/ACT inhaler Inhale 2 puffs into the lungs every 6 (six) hours as needed for wheezing or shortness of breath. 02/03/18   Azzie Glatter, FNP  aspirin 81 MG tablet Take 81 mg by  mouth daily.    [provider]  blood glucose meter kit and supplies Dispense based on patient and insurance preference. Use up to four times daily as directed. (FOR ICD-10 E10.9, E11.9). 01/15/18   Eugenie Filler, MD  buPROPion (WELLBUTRIN SR) 150 MG 12 hr tablet Take 150 mg by mouth 2 (two) times daily.    [provider]  diphenhydrAMINE (BENADRYL) 25 MG tablet Take 1 tablet (25 mg total) by mouth every 6 (six) hours. Patient taking differently: Take 25 mg by mouth daily.  09/07/17   Jola Schmidt, MD  fluticasone (FLONASE) 50 MCG/ACT nasal spray Place 2 sprays into both nostrils daily. 01/16/18   Eugenie Filler, MD  folic acid (FOLVITE) 1 MG tablet Take 1 tablet (1 mg total) by mouth daily. Patient not taking: Reported on 02/03/2018 01/16/18   Eugenie Filler, MD  furosemide (LASIX) 40 MG tablet Take 1 tablet (40 mg total) by mouth daily. 02/03/18   Azzie Glatter, FNP  meloxicam (MOBIC) 15 MG tablet Take 15 mg by mouth daily.    [provider]  phentermine 15 MG capsule Take 15 mg by mouth every morning.    [provider]  traMADol (ULTRAM) 50 MG tablet Take 1 tablet (50 mg total) by mouth every 8 (eight) hours as needed. 02/13/18   Azzie Glatter, FNP  umeclidinium bromide (INCRUSE ELLIPTA) 62.5 MCG/INH AEPB Inhale 1 puff into the lungs daily. Patient not taking: Reported on 07/28/2019 02/11/18   Azzie Glatter, FNP     Critical care time: 39 minutes    Donell Beers, [place] Pager [phone] (please enter 7 digits) PCCM Consult Pager [phone] (please enter 7 digits)

## 2022-03-04 NOTE — Progress Notes (Signed)
Palliative:     Mr. Sayvion, Vigen, is lying quietly in bed.  He appears acutely/chronically ill, morbidly obese.  He is intubated/ventilated.  He is clearly unable to make his basic needs known.  There is no family at bedside at this time.  Detail conference with bedside nursing staff and critical care NP.  Call to sister, Chalmers Cater.  Lupita Leash states that she is continuing to hope for some recovery.  She shares that she has not discussed hard choices with her brothers and sisters, as she has been hopeful for some improvements.  We talked about ventilation, proning, FiO2.  I attempted to bring Jeff's humanity, his goals and desires, into our conversation.  Lupita Leash states that she has held some paperwork about his insurance in the home and she would like transition of care team to assist.  Conference with attending, bedside nursing staff, and TOC team related to patient condition, needs, GOC.   Plan:    At this point continue full scope/full code.  Time for outcomes.  PMT to continue to follow  50 minutes  Lillia Carmel, NP Palliative medicine team Team phone [phone] Greater than 50% of this time was spent counseling and coordinating care related to the above assessment and plan.

## 2022-03-05 ENCOUNTER — Inpatient Hospital Stay: Payer: Medicaid Other

## 2022-03-05 LAB — BASIC METABOLIC PANEL
Anion gap: 10 (ref 5–15)
BUN: 40 mg/dL — ABNORMAL HIGH (ref 6–20)
CO2: 30 mmol/L (ref 22–32)
Calcium: 9.3 mg/dL (ref 8.9–10.3)
Chloride: 98 mmol/L (ref 98–111)
Creatinine, Ser: 0.67 mg/dL (ref 0.61–1.24)
GFR, Estimated: >60 mL/min (ref >60)
Glucose, Bld: 131 mg/dL — ABNORMAL HIGH (ref 70–99)
Potassium: 3.3 mmol/L — ABNORMAL LOW (ref 3.5–5.1)
Sodium: 138 mmol/L (ref 135–145)

## 2022-03-05 LAB — CBC WITH DIFFERENTIAL/PLATELET
Abs Immature Granulocytes: 0.02 10*3/uL (ref 0.00–0.07)
Basophils Absolute: 0 10*3/uL (ref 0.0–0.1)
Basophils Relative: 1 %
Eosinophils Absolute: 0.1 10*3/uL (ref 0.0–0.5)
Eosinophils Relative: 2 %
HCT: 56.5 % — ABNORMAL HIGH (ref 39.0–52.0)
Hemoglobin: 17.2 g/dL — ABNORMAL HIGH (ref 13.0–17.0)
Immature Granulocytes: 0 %
Lymphocytes Relative: 13 %
Lymphs Abs: 0.8 10*3/uL (ref 0.7–4.0)
MCH: 27.9 pg (ref 26.0–34.0)
MCHC: 30.4 g/dL (ref 30.0–36.0)
MCV: 91.7 fL (ref 80.0–100.0)
Monocytes Absolute: 0.8 10*3/uL (ref 0.1–1.0)
Monocytes Relative: 12 %
Neutro Abs: 4.5 10*3/uL (ref 1.7–7.7)
Neutrophils Relative %: 72 %
Platelets: 245 10*3/uL (ref 150–400)
RBC: 6.16 MIL/uL — ABNORMAL HIGH (ref 4.22–5.81)
RDW: 18.1 % — ABNORMAL HIGH (ref 11.5–15.5)
WBC: 6.3 10*3/uL (ref 4.0–10.5)
nRBC: 0 % (ref 0.0–0.2)

## 2022-03-05 LAB — GLUCOSE, CAPILLARY
Glucose-Capillary: 128 mg/dL — ABNORMAL HIGH (ref 70–99)
Glucose-Capillary: 152 mg/dL — ABNORMAL HIGH (ref 70–99)
Glucose-Capillary: 155 mg/dL — ABNORMAL HIGH (ref 70–99)
Glucose-Capillary: 167 mg/dL — ABNORMAL HIGH (ref 70–99)
Glucose-Capillary: 215 mg/dL — ABNORMAL HIGH (ref 70–99)
Glucose-Capillary: 243 mg/dL — ABNORMAL HIGH (ref 70–99)

## 2022-03-05 LAB — BLOOD GAS, ARTERIAL
Acid-Base Excess: 6.5 mmol/L — ABNORMAL HIGH (ref 0.0–2.0)
Bicarbonate: 32.8 mmol/L — ABNORMAL HIGH (ref 20.0–28.0)
FIO2: 55 %
Mechanical Rate: 16
O2 Saturation: 97.9 %
PEEP: 12 cmH2O
Patient temperature: 37
Pressure control: 14 cmH2O
pCO2 arterial: 53 mmHg — ABNORMAL HIGH (ref 32–48)
pH, Arterial: 7.4 (ref 7.35–7.45)
pO2, Arterial: 83 mmHg (ref 83–108)

## 2022-03-05 LAB — PROCALCITONIN: Procalcitonin: 0.2 ng/mL

## 2022-03-05 LAB — TRIGLYCERIDES: Triglycerides: 191 mg/dL — ABNORMAL HIGH (ref <150)

## 2022-03-05 LAB — MAGNESIUM: Magnesium: 1.9 mg/dL (ref 1.7–2.4)

## 2022-03-05 LAB — PHOSPHORUS: Phosphorus: 3.9 mg/dL (ref 2.5–4.6)

## 2022-03-05 MED ORDER — MAGNESIUM SULFATE IN D5W 1-5 GM/100ML-% IV SOLN
1.0000 g | Freq: Once | INTRAVENOUS | Status: AC
  Administered 2022-03-05: 1 g via INTRAVENOUS
  Filled 2022-03-05: qty 100

## 2022-03-05 MED ORDER — POTASSIUM CHLORIDE 10 MEQ/50ML IV SOLN
10.0000 meq | INTRAVENOUS | Status: AC
  Administered 2022-03-05 (×4): 10 meq via INTRAVENOUS
  Filled 2022-03-05 (×4): qty 50

## 2022-03-05 MED ORDER — PIVOT 1.5 CAL PO LIQD
1000.0000 mL | ORAL | Status: DC
  Administered 2022-03-05 – 2022-03-11 (×7): 1000 mL

## 2022-03-05 MED ORDER — ACETAZOLAMIDE 250 MG PO TABS
500.0000 mg | ORAL_TABLET | Freq: Once | ORAL | Status: AC
  Administered 2022-03-05: 500 mg
  Filled 2022-03-05: qty 2

## 2022-03-05 MED ORDER — POTASSIUM CHLORIDE 20 MEQ PO PACK
40.0000 meq | PACK | Freq: Once | ORAL | Status: AC
Start: 2022-03-05 — End: 2022-03-05
  Administered 2022-03-05: 40 meq
  Filled 2022-03-05: qty 2

## 2022-03-05 MED ORDER — METOLAZONE 5 MG PO TABS
5.0000 mg | ORAL_TABLET | Freq: Once | ORAL | Status: AC
  Administered 2022-03-05: 5 mg
  Filled 2022-03-05: qty 1

## 2022-03-05 NOTE — Progress Notes (Signed)
Nutrition Follow Up Note   DOCUMENTATION CODES:   Morbid obesity  INTERVENTION:   Change to Pivot 1.5@70ml/hr continuous   Free water flushes 100ml q4 hours   Regimen provides 2520kcal/day, 158g/day protein and 1875ml/day of free water.   NUTRITION DIAGNOSIS:   Inadequate oral intake related to inability to eat (pt sedated and ventilated) as evidenced by NPO status.  GOAL:   Provide needs based on ASPEN/SCCM guidelines -not met  MONITOR:   Vent status, Labs, Weight trends, TF tolerance, Skin, I & O's  ASSESSMENT:   61 y/o male with h/o COPD, DM, HTN, HLD, CHF, polycythemia and alpha gal who is admitted with rhinovirus, PNA and AMS.  Pt remains sedated and ventilated. OGT in place. Pt tolerating tube feeds at goal rate; will adjust as propofol discontinued. Pt unable to receive any protein modulars r/t his Alpha Gal allergy. Refeed labs stable. Pt had BM yesterday. Per chart, pt is down 17lbs since admission; pt +3.7L on his I & Os. Palliative care following. Plan is for possible tracheostomy vs one way extubation.   Medications reviewed and include: colace, insulin, solu-medrol, nicotine, protonix, miralax  Labs reviewed: K 3.3(L), BUN 40(H), P 3.9 wnl, Mg 1.9 wnl Cbgs- 215, 152, 128 x 24 hrs  Patient is currently intubated on ventilator support MV: 10.7 L/min Temp (24hrs), Avg:100.1 F (37.8 C), Min:98.7 F (37.1 C), Max:101.6 F (38.7 C)  Propofol: none   MAP- >65mmHg   UOP- 1700ml   Diet Order:    Diet Order             Diet NPO time specified  Diet effective now                  EDUCATION NEEDS:   No education needs have been identified at this time  Skin:  Skin Assessment: Reviewed RN Assessment (ecchymosis)  Last BM:  8/28  Height:   Ht Readings from Last 1 Encounters:  02/23/22 5' 8" (1.727 m)    Weight:   Wt Readings from Last 1 Encounters:  03/05/22 (!) 137.2 kg    Ideal Body Weight:  70 kg  BMI:  Body mass index is 45.99  kg/m.  Estimated Nutritional Needs:   Kcal:  2500-2800kcal/day  Protein:  150-175g/day  Fluid:  2.1-2.4L/day    MS, RD, LDN Please refer to AMION for RD and/or RD on-call/weekend/after hours pager  

## 2022-03-05 NOTE — Progress Notes (Signed)
Patient transported to MRI and back on transport vent with no complications.

## 2022-03-05 NOTE — Consult Note (Signed)
PHARMACY CONSULT NOTE  Pharmacy Consult for Electrolyte Monitoring and Replacement   Recent Labs: Potassium (mmol/L)  Date Value  03/05/2022 3.3 (L)  04/21/2012 4.8   Magnesium (mg/dL)  Date Value  73/53/2992 1.9   Calcium (mg/dL)  Date Value  42/68/3419 9.3   Calcium, Total (mg/dL)  Date Value  62/22/9798 8.8   Albumin (g/dL)  Date Value  92/05/9416 4.1  04/21/2012 4.0   Phosphorus (mg/dL)  Date Value  40/81/4481 3.9   Sodium (mmol/L)  Date Value  03/05/2022 138  04/21/2012 139   Assessment: Pharmacy has been consulted to monitor electrolytes in 60yo male presenting to the ED complaining of worsening bilateral leg swelling and pain. He was self-treating with mucinex and took a COVID test which was negative. He is currently intubated and sedated in CCU. Pharmacy is asked to follow and replace electrolytes while in CCU  Nutrition: Tube feeds @55ml /h (1.3L/d), FWF q4h (650mL/d)   Goal of Therapy:  Electrolytes within normal limits  Plan:  Net I/O +3.5L (UOP 0.6-0.5 ml/k/h);  -- Aggressive diuresis most recently: Lasix 40mg  x1 (8/27), metolazone 5mg  PT x1 8/28-29, acetazolamide 500mg  IV 8/28-29  K 3.9>3.3, Will repeat repletion with Kcl 40 mEq per tube x1 and Kcl 10 mEq IV x4 doses based on prior response per CCM. Mg 1.9, Will replete modestly with MgSO 1g IV x1 Other lytes remain WNL, will CTM with AM labs and adjust PRN. --Follow-up electrolytes with AM labs tomorrow  03/05/2022 7:53 AM

## 2022-03-05 NOTE — Progress Notes (Signed)
NAME:  Bryan Mitchell, MRN:  [id number], DOB:  1961/05/08, LOS: 10 ADMISSION DATE:  02/23/2022, CONSULTATION DATE:  02/23/22 REFERRING MD:  Dr. Toy Cookey, CHIEF COMPLAINT:  SOB and fatigue   History of Present Illness:  61 yo M presenting to [facility] ED on 02/23/22 from home in a wheelchair with his sister for evaluation of worsening fatigue, poor appetite and SOB over the past 2 weeks. Per ED documentation, patient also complained of worsening bilateral leg swelling and pain.  Sister interviewed over the telephone providing supplemental history. She described that he lives alone, and began not feeling good for about 2 weeks with complaints of dyspnea and fatigue. He self-treated with Mucinex and took a COVID test which was negative. He started having increased congestion and poor PO intake. On the night of 8/18 the sister spoke with the patient on the phone and he told her, "I feel like I'm dying". Today on 8/19 she convinced the patient to go to the hospital to be evaluated. She reports he does not trust doctors and has difficulty following outpatient medication regimens. He was prescribed home O2, and has oxygen tanks at the house but has not used them in 2 years per his sister. He also injured his RIGHT knee a few weeks ago and had been getting around with a crutch. She reported he had been a little more confused on 8/19.  Per ED documentation, he endorsed a productive cough, but denied fever/chills. Patient admitted to not taking his Lasix when interviewed by [facility] service, per their documentation. And he has noticed increased pedal edema recently.  ED course: Upon arrival in triage the patient was profoundly hypoxic with his SpO2 was in the 60's on RA, this initially recovered to 99% on 3 L [place]. ABG revealed partially compensated hypercapnia and he was placed on BIPAP. He received Lasix for diuresis, BNP was mildly elevated. CT angio negative for pulmonary edema/effusions, negative for PE. [facility] service  consulted for admission. After a couple of hours on BIPAP, patient became combative and agitated, ripping off the BIPAP mask. Patient emergently intubated in ED. PCCM consulted. Medications given: IV contrast, Duo-nebs, lasix, fondaparinux, doxycycline, fentanyl, versed, ketamine, rocuronium & succinylcholine for intubation, solumedrol, propofol drip started Initial Vitals: 97.9, 20, 100, 117/68 & SpO2 62% on RA Significant labs: (Labs/ Imaging personally reviewed) I, Domingo Pulse Rust[place], AGACNP-BC, personally viewed and interpreted this ECG. EKG Interpretation: Date: 02/23/22, EKG Time: 16:27, Rate: 98, Rhythm: NSR, QRS Axis:  borderline RAD, Intervals: normal, ST/T Wave abnormalities: inferior T wave inversions, Narrative Interpretation: NSR with borderline RAD and probable LAE with inferior T wave inversions Chemistry: Na+: 139, K+: 3.8, BUN/Cr.: 31/0.79, Serum CO2/ AG: 32/8 Hematology: WBC: 6.9, Hgb: 18.9, Hct: 63.3, plt: 178 BNP: 283.7, PCT: 0.22, COVID-19 & Influenza A/B: negative ABG: 7.25/ 99/ 59/ 43.4 CXR 02/23/22: pulmonary hypoinflation. Central pulmonary vascular engorgement without overt pulmonary edema. Stable tubes CT 02/23/22: no evidence of pulmonary embolism. Mild cardiomegaly, CAD, main pulmonary trunk is dilated measuring 3.9 cm suggesting pulmonary arterial hypertension. Mild bibasilar atelectasis. No evidence of pulmonary edema or pleural effusion. CT head wo contrast 02/23/22: pending  PCCM consulted due to worsening acute on chronic hypoxic/hypercapnic respiratory failure requiring emergent intubation and mechanical ventilatory support.  Pertinent  Medical History  T2DM COPD Morbid obesity Tobacco abuse (cigars, 40 pack year history) Secondary Polycythemia HFpEF  Micro Data:  8/19: SARS-CoV-2 & Influenza PCR>>negative 8/19: RVP>> Rhinovirus/Enterovirus 8/19: MRSA PCR>> negative 8/20: Tracheal aspirate>>normal flora 8/28: Tracheal aspirate  Antimicrobials:   Ceftriaxone 8/19>>8/23 Doxycycline 8/19>>8/23  Significant Hospital Events: Including procedures, antibiotic start and stop dates in addition to other pertinent events   02/23/22: Admit to ICU with worsening acute on chronic hypoxic/hypercapnic respiratory failure s/t suspected COPD exacerbation in the setting of HFpEF and possible pulmonary hypertension requiring emergent intubation and mechanical ventilatory support. 02/24/22: Pt remains mechanically intubated settings decreased: PEEP 5/FiO2 40%.   02/25/22: Increased vent requirements this morning due to diffuse pulmonary vascular congestion, received lasix. Tracheal aspirate with gram + cocci & rods, gram - rods.  Severe Delirium upon WUA. 02/26/22: Remains on vent, 60% fio2 & 8 PEEP.  Diurese with Diamox + Metolazone. 02/27/22: No acute events overnight; remains mechanically intubated current vent settings: 40%/8 PEEP  02/28/22: Pts O2 requirements increased overnight due to hypoxia vent settings: FiO2 50%/PEEP 8 unable to perform SBT  03/01/22: Overnight with hypoxia, concern for ? Mucus plug.  Vent settings this am 100% FiO2 & 8 PEEP.  Plan for Diuresis, considering Bronchoscopy 03/02/22: Continued high vent settings (100%, 12 PEEP), start recruitment maneuvers, considering bronch.  Diurese. 03/03/22: Continued high vent settings (100% FiO2, 12 PEEP). Diuresing.  Obtain CT Angiogram Chest 03/03/22: CTA Chest revealed new bilateral lower lobe collapse or         consolidation since 02/23/2022. Very small superimposed          layering pleural effusions. No convincing acute pulmonary        edema. Suboptimal pulmonary artery contrast bolus timing. No          central pulmonary embolus. Satisfactory endotracheal and        enteric tubes. Aortic Atherosclerosis (ICD10-I70.0). 03/04/22: Pt currently proned and tolerating.  Remains on high ventilator settings: FiO2 80%/PEEP 15/PC O2 sats 99% 03/05/22: Pt remains mechanically intubated vent settings  currently FiO2 55%/PEEP 12.  Palliative Care working with family regarding goals of care   Interim History / Subjective:  As outlined above under significant events   Objective   Blood pressure 133/86, pulse 100, temperature (!) 100.4 F (38 C), temperature source Oral, resp. rate 16, height 5' 8"  (1.727 m), weight (!) 137.2 kg, SpO2 94 %.    Vent Mode: PCV FiO2 (%):  [55 %-80 %] 55 % Set Rate:  [16 bmp] 16 bmp PEEP:  [12 cmH20-15 cmH20] 12 cmH20 Plateau Pressure:  [22 cmH20-29 cmH20] 23 cmH20   Intake/Output Summary (Last 24 hours) at 03/05/2022 0729 Last data filed at 03/05/2022 0600 Gross per 24 hour  Intake 1864.71 ml  Output 1700 ml  Net 164.71 ml   Filed Weights   03/03/22 0358 03/04/22 0410 03/05/22 0358  Weight: (!) 146.8 kg (!) 136.7 kg (!) 137.2 kg    Examination: General: Acutely on chronically-ill appearing male, NAD mechanically intubated  HEENT: Supple, no JVD present  Neuro: RASS: -3 (requiring deeper sedation due to high vent requirements), unable to follow commands, PERRL CV: Sinus tachycardia, rrr, no r/g, 2+ radial/2+ distal pulses, 1+ bilateral lower extremity edema  Pulm: Faint rhonchi throughout, even, non labored, synchronous with vent GI: +BS x4, obese, soft, non distended  GU: Foley in place with dark yellow urine Skin: No rashes or lesions present Extremities: Normal bulk and tone  Resolved Hospital Problem list   Hypotension secondary to possible sepsis vs sedation related   Assessment & Plan:   Acute on Chronic Hypoxic / Hypercapnic Respiratory Failure suspect multifocal in the setting of suspected COPD exacerbation, decompensated HFpEF and rhinovirus Tobacco Abuse PMHx: COPD, morbid  obesity, tobacco abuse, HFpEF   CTA Chest 08/27: new bilateral lower lobe collapse or consolidation; negative for central PE  - Full vent support, implement lung protective strategies - Plateau pressures less than 30 cm H20 - Wean FiO2 & PEEP as tolerated to  maintain O2 sats 88 to 92% - Follow intermittent Chest X-ray & ABG as needed - Spontaneous Breathing Trials when respiratory parameters met and mental status permits - VAP Bundle - Recruitment maneuvers - Bronchodilators & Pulmicort nebs - Continue iv steroids  - Prn proning to improve VQ matching and recruit bases  - Completed course of ABX as above - Repeat tracheal aspirate results pending   Acute on Chronic HFpEF exacerbation Mildly elevated Troponin, suspect demand ischemia Query Pulmonary HTN PMHx: HFpEF  Echocardiogram 02/24/22: LVEF 60-65%, Grade II DD - Continuous  telemetry monitoring - Maintain MAP >65 - Trend HS Troponin until downtrending (59 ~ 71 ~ 143 ~ 221 ~) - Diurese as BP and renal function permits to keep net negative: will give a dose of diamox and metolazone  Rhinovirus - Trend WBC and monitor fever curve  - Completed course of abx therapy due to suspected bacterial pneumonia, however tracheal aspirate with normal respiratory flora  Polycythemia suspected secondary to chronic hypoxia ~improving  Followed by oncology outpatient, last seen 10/2019. He was offered a sleep study which he declined. At that time, he was deemed not a candidate for continued follow up as Hgb had stabilized at 17.0 & HCT was 51%.  Hgb: 18.9, Hct: 63.3 - Daily CBC - Spoke with on call oncology NP on 08/20: no interventions required at this time due to CTA Chest negative for PE and pts noncompliance with outpatient oxygen has likely worsened polycythemia.  Once pts oxygenation improves this will likely improve polycythemia pt can follow-up with hematology in outpatient setting.  Official hematology consult canceled   Type 2 Diabetes Mellitus Hemoglobin A1C: 6.5 - Monitor CBG Q 4 hours - SSI resistant dosing - Target range while in ICU: 140-180 - Follow ICU hyper/hypo-glycemia protocol  Acute Metabolic Encephalopathy Sedation needs in the setting of mechanical ventilation  CT Head  negative 8/19 - Treatment of metabolic derangements as outlined above - Maintain a RASS goal -1 - Continue versed and fentanyl gtts to maintain RASS goal; continue valium per tube  - Daily wake up assessment   Pt is critically ill.  Prognosis is guarded.  High risk for cardiac arrest and death.  Palliative Care is following for GOC. If family decides to proceed with aggressive care pt will need tracheostomy   Best Practice (right click and "Reselect all SmartList Selections" daily)  Diet/type: NPO w/ meds via tube; tube feeds DVT prophylaxis: Arixtra GI prophylaxis: PPI Lines: Left radial arterial line; Left IJ central line  Foley:  Yes, and it is still needed Code Status:  full code Last date of multidisciplinary goals of care discussion [03/05/22]  Labs   CBC: Recent Labs  Lab 03/01/22 0556 03/02/22 0311 03/03/22 0447 03/04/22 0500 03/05/22 0517  WBC 6.6 6.4 5.3 5.9 6.3  NEUTROABS  --   --   --   --  4.5  HGB 17.3* 16.7 17.3* 17.5* 17.2*  HCT 57.8* 55.6* 56.7* 58.7* 56.5*  MCV 93.2 91.6 91.7 91.7 91.7  PLT 180 206 235 245 881    Basic Metabolic Panel: Recent Labs  Lab 03/01/22 0556 03/02/22 0311 03/03/22 0447 03/04/22 0500 03/05/22 0517  NA 136 136 139 141 138  K 3.7 3.7  3.3* 3.9 3.3*  CL 97* 99 96* 95* 98  CO2 31 32 35* 34* 30  GLUCOSE 134* 147* 114* 150* 131*  BUN 29* 35* 36* 37* 40*  CREATININE 0.63 0.78 0.66 0.66 0.67  CALCIUM 8.4* 8.7* 9.1 9.1 9.3  MG 2.4 2.5* 1.9 1.9 1.9  PHOS 4.1 3.6 3.0 3.0 3.9   GFR: Estimated Creatinine Clearance: 134.9 mL/min (by C-G formula based on SCr of 0.67 mg/dL). Recent Labs  Lab 03/02/22 0311 03/03/22 0447 03/04/22 0500 03/05/22 0517  PROCALCITON  --  0.17  --   --   WBC 6.4 5.3 5.9 6.3    Liver Function Tests: No results for input(s): "AST", "ALT", "ALKPHOS", "BILITOT", "PROT", "ALBUMIN" in the last 168 hours. No results for input(s): "LIPASE", "AMYLASE" in the last 168 hours. No results for input(s): "AMMONIA"  in the last 168 hours.  ABG    Component Value Date/Time   PHART 7.4 03/05/2022 0500   PCO2ART 53 (H) 03/05/2022 0500   PO2ART 83 03/05/2022 0500   HCO3 32.8 (H) 03/05/2022 0500   TCO2 39 (H) 01/13/2018 0006   O2SAT 97.9 03/05/2022 0500     Coagulation Profile: No results for input(s): "INR", "PROTIME" in the last 168 hours.  Cardiac Enzymes: No results for input(s): "CKTOTAL", "CKMB", "CKMBINDEX", "TROPONINI" in the last 168 hours.  HbA1C: Hemoglobin A1C  Date/Time Value Ref Range Status  02/13/2018 01:52 PM 7.5 (A) 4.0 - 5.6 % Final   Hgb A1c MFr Bld  Date/Time Value Ref Range Status  02/23/2022 10:05 PM 6.5 (H) 4.8 - 5.6 % Final    Comment:    (NOTE) Pre diabetes:          5.7%-6.4%  Diabetes:              >6.4%  Glycemic control for   <7.0% adults with diabetes   01/13/2018 09:53 AM 7.3 (H) 4.8 - 5.6 % Final    Comment:    (NOTE) Pre diabetes:          5.7%-6.4% Diabetes:              >6.4% Glycemic control for   <7.0% adults with diabetes     CBG: Recent Labs  Lab 03/04/22 1150 03/04/22 1516 03/04/22 1936 03/04/22 2318 03/05/22 0314  GLUCAP 238* 274* 165* 151* 128*    Review of Systems:   UTA-patient intubated and sedated  Past Medical History:  He,  has a past medical history of (HFpEF) heart failure with preserved ejection fraction ([place]), Allergic reaction (12/15/2016), COPD (chronic obstructive pulmonary disease) ([place]), Hyperglycemia (12/2016), Hypertension, and Polycythemia.   Surgical History:   Past Surgical History:  Procedure Laterality Date   KNEE SURGERY Right      Social History:   reports that he has been smoking cigars. He has never used smokeless tobacco. He reports current alcohol use. He reports that he does not use drugs.   Family History:  His family history includes CAD in his brother and father.   Allergies Allergies  Allergen Reactions   Other Anaphylaxis    McCormick's Rub (Food)   Beef-Derived Products  Itching    Alpha gal allergy   Pork-Derived Products Hives    Alpha gal     Home Medications  Prior to Admission medications   Medication Sig Start Date End Date Taking? Authorizing Provider  albuterol (PROVENTIL HFA;VENTOLIN HFA) 108 (90 Base) MCG/ACT inhaler Inhale 2 puffs into the lungs every 6 (six) hours as needed for  wheezing or shortness of breath. 02/03/18   Azzie Glatter, FNP  aspirin 81 MG tablet Take 81 mg by mouth daily.    [provider]  blood glucose meter kit and supplies Dispense based on patient and insurance preference. Use up to four times daily as directed. (FOR ICD-10 E10.9, E11.9). 01/15/18   Eugenie Filler, MD  buPROPion (WELLBUTRIN SR) 150 MG 12 hr tablet Take 150 mg by mouth 2 (two) times daily.    [provider]  diphenhydrAMINE (BENADRYL) 25 MG tablet Take 1 tablet (25 mg total) by mouth every 6 (six) hours. Patient taking differently: Take 25 mg by mouth daily.  09/07/17   Jola Schmidt, MD  fluticasone (FLONASE) 50 MCG/ACT nasal spray Place 2 sprays into both nostrils daily. 01/16/18   Eugenie Filler, MD  folic acid (FOLVITE) 1 MG tablet Take 1 tablet (1 mg total) by mouth daily. Patient not taking: Reported on 02/03/2018 01/16/18   Eugenie Filler, MD  furosemide (LASIX) 40 MG tablet Take 1 tablet (40 mg total) by mouth daily. 02/03/18   Azzie Glatter, FNP  meloxicam (MOBIC) 15 MG tablet Take 15 mg by mouth daily.    [provider]  phentermine 15 MG capsule Take 15 mg by mouth every morning.    [provider]  traMADol (ULTRAM) 50 MG tablet Take 1 tablet (50 mg total) by mouth every 8 (eight) hours as needed. 02/13/18   Azzie Glatter, FNP  umeclidinium bromide (INCRUSE ELLIPTA) 62.5 MCG/INH AEPB Inhale 1 puff into the lungs daily. Patient not taking: Reported on 07/28/2019 02/11/18   Azzie Glatter, FNP     Critical care time: 52 minutes    Donell Beers, [place] Pager [phone]  (please enter 7 digits) PCCM Consult Pager [phone] (please enter 7 digits)

## 2022-03-05 NOTE — Progress Notes (Signed)
Updated pts sister Chalmers Cater regarding pts condition, and also discussed goals of care.  Discussed HIGH possibility Mr Mccoin would require tracheostomy placement due to current ventilator settings if she wants to continue aggressive treatment. I encouraged her to think about if pt does require a tracheostomy is this something HE would want if he were able to make the decision for himself.  Mrs. Marca Ancona stated she would like to wait a couple of days prior to making this decision to see if the pt improves to the point he won't require tracheostomy placement.  All questions were answered and Mrs. Marca Ancona was appreciative to receive an update.  Zada Girt, AGNP  Pulmonary/Critical Care Pager [phone] (please enter 7 digits) PCCM Consult Pager [phone] (please enter 7 digits)

## 2022-03-06 ENCOUNTER — Inpatient Hospital Stay: Payer: Medicaid Other

## 2022-03-06 DIAGNOSIS — L8990 Pressure ulcer of unspecified site, unspecified stage: Secondary | ICD-10-CM | POA: Insufficient documentation

## 2022-03-06 LAB — BASIC METABOLIC PANEL
Anion gap: 8 (ref 5–15)
BUN: 39 mg/dL — ABNORMAL HIGH (ref 6–20)
CO2: 32 mmol/L (ref 22–32)
Calcium: 9.8 mg/dL (ref 8.9–10.3)
Chloride: 100 mmol/L (ref 98–111)
Creatinine, Ser: 0.62 mg/dL (ref 0.61–1.24)
GFR, Estimated: >60 mL/min (ref >60)
Glucose, Bld: 142 mg/dL — ABNORMAL HIGH (ref 70–99)
Potassium: 3 mmol/L — ABNORMAL LOW (ref 3.5–5.1)
Sodium: 140 mmol/L (ref 135–145)

## 2022-03-06 LAB — CBC WITH DIFFERENTIAL/PLATELET
Abs Immature Granulocytes: 0.02 10*3/uL (ref 0.00–0.07)
Basophils Absolute: 0 10*3/uL (ref 0.0–0.1)
Basophils Relative: 0 %
Eosinophils Absolute: 0.1 10*3/uL (ref 0.0–0.5)
Eosinophils Relative: 2 %
HCT: 57.5 % — ABNORMAL HIGH (ref 39.0–52.0)
Hemoglobin: 17.2 g/dL — ABNORMAL HIGH (ref 13.0–17.0)
Immature Granulocytes: 0 %
Lymphocytes Relative: 18 %
Lymphs Abs: 1.3 10*3/uL (ref 0.7–4.0)
MCH: 27.7 pg (ref 26.0–34.0)
MCHC: 29.9 g/dL — ABNORMAL LOW (ref 30.0–36.0)
MCV: 92.6 fL (ref 80.0–100.0)
Monocytes Absolute: 0.7 10*3/uL (ref 0.1–1.0)
Monocytes Relative: 10 %
Neutro Abs: 4.8 10*3/uL (ref 1.7–7.7)
Neutrophils Relative %: 70 %
Platelets: 245 10*3/uL (ref 150–400)
RBC: 6.21 MIL/uL — ABNORMAL HIGH (ref 4.22–5.81)
RDW: 17.3 % — ABNORMAL HIGH (ref 11.5–15.5)
WBC: 7 10*3/uL (ref 4.0–10.5)
nRBC: 0 % (ref 0.0–0.2)

## 2022-03-06 LAB — GLUCOSE, CAPILLARY
Glucose-Capillary: 155 mg/dL — ABNORMAL HIGH (ref 70–99)
Glucose-Capillary: 166 mg/dL — ABNORMAL HIGH (ref 70–99)
Glucose-Capillary: 171 mg/dL — ABNORMAL HIGH (ref 70–99)
Glucose-Capillary: 182 mg/dL — ABNORMAL HIGH (ref 70–99)
Glucose-Capillary: 198 mg/dL — ABNORMAL HIGH (ref 70–99)
Glucose-Capillary: 255 mg/dL — ABNORMAL HIGH (ref 70–99)

## 2022-03-06 LAB — PHOSPHORUS: Phosphorus: 2.2 mg/dL — ABNORMAL LOW (ref 2.5–4.6)

## 2022-03-06 LAB — MAGNESIUM: Magnesium: 1.8 mg/dL (ref 1.7–2.4)

## 2022-03-06 MED ORDER — POTASSIUM CHLORIDE 20 MEQ PO PACK
40.0000 meq | PACK | ORAL | Status: AC
  Administered 2022-03-06 (×2): 40 meq
  Filled 2022-03-06 (×2): qty 2

## 2022-03-06 MED ORDER — POTASSIUM CHLORIDE 20 MEQ PO PACK
40.0000 meq | PACK | Freq: Once | ORAL | Status: DC
Start: 2022-03-06 — End: 2022-03-06
  Filled 2022-03-06: qty 2

## 2022-03-06 MED ORDER — DIAZEPAM 5 MG PO TABS
5.0000 mg | ORAL_TABLET | Freq: Four times a day (QID) | ORAL | Status: DC | PRN
Start: 2022-03-06 — End: 2022-03-11

## 2022-03-06 MED ORDER — STERILE WATER FOR INJECTION IJ SOLN
INTRAMUSCULAR | Status: AC
  Filled 2022-03-06: qty 10

## 2022-03-06 MED ORDER — MAGNESIUM SULFATE 2 GM/50ML IV SOLN
2.0000 g | Freq: Once | INTRAVENOUS | Status: AC
  Administered 2022-03-06: 2 g via INTRAVENOUS
  Filled 2022-03-06: qty 50

## 2022-03-06 MED ORDER — POTASSIUM CHLORIDE 10 MEQ/100ML IV SOLN
10.0000 meq | INTRAVENOUS | Status: AC
  Administered 2022-03-06 (×2): 10 meq via INTRAVENOUS
  Filled 2022-03-06 (×2): qty 100

## 2022-03-06 NOTE — Progress Notes (Signed)
Chalmers Cater called for update on patient.  This nurse updated Chalmers Cater.  Chalmers Cater inquired about MRI results and other questions regarding patients sedation.  The questions asked were out of nursing scope to answer.  I scheduled meeting with Lupita Leash and Dr. Belia Heman at [facility] today at 6 pm.

## 2022-03-06 NOTE — Consult Note (Addendum)
PHARMACY CONSULT NOTE  Pharmacy Consult for Electrolyte Monitoring and Replacement   Recent Labs: Potassium (mmol/L)  Date Value  03/06/2022 3.0 (L)  04/21/2012 4.8   Magnesium (mg/dL)  Date Value  76/73/4193 1.8   Calcium (mg/dL)  Date Value  79/08/4095 9.8   Calcium, Total (mg/dL)  Date Value  35/32/9924 8.8   Albumin (g/dL)  Date Value  26/83/4196 4.1  04/21/2012 4.0   Phosphorus (mg/dL)  Date Value  22/29/7989 2.2 (L)   Sodium (mmol/L)  Date Value  03/06/2022 140  04/21/2012 139   Assessment: Pharmacy has been consulted to monitor electrolytes in 60yo male presenting to the ED complaining of worsening bilateral leg swelling and pain. He was self-treating with mucinex and took a COVID test which was negative. He is currently intubated and sedated in CCU. Pharmacy is asked to follow and replace electrolytes while in CCU  Nutrition: Tube feeds @55ml /h (1.3L/d), FWF q4h (656mL/d)   Goal of Therapy:  Electrolytes within normal limits  Nutrition:  FWF 80m q4h, Tube feeds 55>83mL/hr  Plan:  Net I/O +5.1L (UOP 0.5-0.6 ml/k/h); -- Aggressive intermittently dosed diuresis most recently: Lasix 40mg  x1 (8/27), metolazone 5mg  PT x1 8/28-29, acetazolamide 500mg  IV 8/28-29  K 3.9>3.3>3.0, Despite Kcl 40 mEq per tube x1 and Kcl 10 mEq IV x4 doses based on prior response. Replete w/ KCL PO q2h x2 dose in addition to 07-21-2003 IV q1h x2 doses Mg 1.9>1.7, Will replete with MgSO 2g IV x1 Other lytes remain WNL, will CTM with AM labs and adjust PRN. --Follow-up electrolytes with AM labs tomorrow  03/06/2022 8:05 AM

## 2022-03-06 NOTE — Progress Notes (Signed)
I have called and left message with Lupita Leash the sister, she did not answer and VM was left to call back

## 2022-03-06 NOTE — IPAL (Signed)
  Interdisciplinary Goals of Care Family Meeting   Date carried out: 03/06/2022  Location of the meeting: Bedside  Member's involved: Physician, Bedside Registered Nurse, and Family Member or next of kin       GOALS OF CARE DISCUSSION  The Clinical status was relayed to family in detail-Bryan Mitchell the Sister  Updated and notified of patients medical condition- Patient remains unresponsive and will not open eyes to command.   Patient is having a weak cough and struggling to remove secretions.   Patient with increased WOB and using accessory muscles to breathe Explained to family course of therapy and the modalities    PATIENT REMAINS FULL CODE  Family understands the situation. Continue Vent support Assess for trial of extubation when able Plan for [facility] if needed after 24-48 hrs of assessment of weaning fio2 and PEEP   Family are satisfied with Plan of action and management. All questions answered  Additional CC time 35 mins   Bryan Mitchell, M.D.  [facility]  Medical Director [facility] Medical Director [facility] Cardio-Pulmonary Department

## 2022-03-06 NOTE — Progress Notes (Signed)
Sister-in-law at bedside asking questions about patient and wanting update.  Sister-in-law not listed in patient contact list.  Patient does not have password in place.  Sister-in-law verbalized that Lupita Leash was the contact person and she was spoke person for family.

## 2022-03-06 NOTE — Progress Notes (Signed)
Sister-in-law asked again for update and verbalized she was not happy with increased oxygen.  Sister-in-law stated "he is running out of time".  Sister-in-law also stated "he is needing all this oxygen because he is on to much sedation".  Sister-in-law stated "I have been texting Lupita Leash and she is not happy that he not awake".  This nurse explained to sister-in-law that Dr. Belia Heman would be meeting with Lupita Leash and updating her about patient and his condition. Sister-in-law verbalized that was fine and she would be leaving.

## 2022-03-06 NOTE — Progress Notes (Signed)
NAME:  Bryan Mitchell, MRN:  [id number], DOB:  15-May-1961, LOS: 61 ADMISSION DATE:  02/23/2022, CONSULTATION DATE:  02/23/22 REFERRING MD:  Dr. Toy Cookey, CHIEF COMPLAINT:  SOB and fatigue   History of Present Illness:  61 yo M presenting to [facility] ED on 02/23/22 from home in a wheelchair with his sister for evaluation of worsening fatigue, poor appetite and SOB over the past 2 weeks. Per ED documentation, patient also complained of worsening bilateral leg swelling and pain.  Sister interviewed over the telephone providing supplemental history. She described that he lives alone, and began not feeling good for about 2 weeks with complaints of dyspnea and fatigue. He self-treated with Mucinex and took a COVID test which was negative. He started having increased congestion and poor PO intake. On the night of 8/18 the sister spoke with the patient on the phone and he told her, "I feel like I'm dying". Today on 8/19 she convinced the patient to go to the hospital to be evaluated. She reports he does not trust doctors and has difficulty following outpatient medication regimens. He was prescribed home O2, and has oxygen tanks at the house but has not used them in 2 years per his sister. He also injured his RIGHT knee a few weeks ago and had been getting around with a crutch. She reported he had been a little more confused on 8/19.  Per ED documentation, he endorsed a productive cough, but denied fever/chills. Patient admitted to not taking his Lasix when interviewed by [facility] service, per their documentation. And he has noticed increased pedal edema recently.  ED course: Upon arrival in triage the patient was profoundly hypoxic with his SpO2 was in the 60's on RA, this initially recovered to 99% on 3 L [place]. ABG revealed partially compensated hypercapnia and he was placed on BIPAP. He received Lasix for diuresis, BNP was mildly elevated. CT angio negative for pulmonary edema/effusions, negative for PE. [facility] service  consulted for admission. After a couple of hours on BIPAP, patient became combative and agitated, ripping off the BIPAP mask. Patient emergently intubated in ED. PCCM consulted. Medications given: IV contrast, Duo-nebs, lasix, fondaparinux, doxycycline, fentanyl, versed, ketamine, rocuronium & succinylcholine for intubation, solumedrol, propofol drip started Initial Vitals: 97.9, 20, 100, 117/68 & SpO2 62% on RA Significant labs: (Labs/ Imaging personally reviewed) I, Domingo Pulse Rust[place], AGACNP-BC, personally viewed and interpreted this ECG. EKG Interpretation: Date: 02/23/22, EKG Time: 16:27, Rate: 98, Rhythm: NSR, QRS Axis:  borderline RAD, Intervals: normal, ST/T Wave abnormalities: inferior T wave inversions, Narrative Interpretation: NSR with borderline RAD and probable LAE with inferior T wave inversions Chemistry: Na+: 139, K+: 3.8, BUN/Cr.: 31/0.79, Serum CO2/ AG: 32/8 Hematology: WBC: 6.9, Hgb: 18.9, Hct: 63.3, plt: 178 BNP: 283.7, PCT: 0.22, COVID-19 & Influenza A/B: negative ABG: 7.25/ 99/ 59/ 43.4 CXR 02/23/22: pulmonary hypoinflation. Central pulmonary vascular engorgement without overt pulmonary edema. Stable tubes CT 02/23/22: no evidence of pulmonary embolism. Mild cardiomegaly, CAD, main pulmonary trunk is dilated measuring 3.9 cm suggesting pulmonary arterial hypertension. Mild bibasilar atelectasis. No evidence of pulmonary edema or pleural effusion. CT head wo contrast 02/23/22: pending  PCCM consulted due to worsening acute on chronic hypoxic/hypercapnic respiratory failure requiring emergent intubation and mechanical ventilatory support.  Pertinent  Medical History  T2DM COPD Morbid obesity Tobacco abuse (cigars, 40 pack year history) Secondary Polycythemia HFpEF  Micro Data:  8/19: SARS-CoV-2 & Influenza PCR>>negative 8/19: RVP>> Rhinovirus/Enterovirus 8/19: MRSA PCR>> negative 8/20: Tracheal aspirate>>normal flora 8/28: Tracheal aspirate  Antimicrobials:   Ceftriaxone 8/19>>8/23 Doxycycline 8/19>>8/23  Significant Hospital Events: Including procedures, antibiotic start and stop dates in addition to other pertinent events   02/23/22: Admit to ICU with worsening acute on chronic hypoxic/hypercapnic respiratory failure s/t suspected COPD exacerbation in the setting of HFpEF and possible pulmonary hypertension requiring emergent intubation and mechanical ventilatory support. 02/24/22: Pt remains mechanically intubated settings decreased: PEEP 5/FiO2 40%.   02/25/22: Increased vent requirements this morning due to diffuse pulmonary vascular congestion, received lasix. Tracheal aspirate with gram + cocci & rods, gram - rods.  Severe Delirium upon WUA. 02/26/22: Remains on vent, 60% fio2 & 8 PEEP.  Diurese with Diamox + Metolazone. 02/27/22: No acute events overnight; remains mechanically intubated current vent settings: 40%/8 PEEP  02/28/22: Pts O2 requirements increased overnight due to hypoxia vent settings: FiO2 50%/PEEP 8 unable to perform SBT  03/01/22: Overnight with hypoxia, concern for ? Mucus plug.  Vent settings this am 100% FiO2 & 8 PEEP.  Plan for Diuresis, considering Bronchoscopy 03/02/22: Continued high vent settings (100%, 12 PEEP), start recruitment maneuvers, considering bronch.  Diurese. 03/03/22: Continued high vent settings (100% FiO2, 12 PEEP). Diuresing.  Obtain CT Angiogram Chest 03/03/22: CTA Chest revealed new bilateral lower lobe collapse or         consolidation since 02/23/2022. Very small superimposed          layering pleural effusions. No convincing acute pulmonary        edema. Suboptimal pulmonary artery contrast bolus timing. No          central pulmonary embolus. Satisfactory endotracheal and        enteric tubes. Aortic Atherosclerosis (ICD10-I70.0). 03/04/22: Pt currently proned and tolerating.  Remains on high ventilator settings: FiO2 80%/PEEP 15/PC O2 sats 99% 03/05/22: Pt remains mechanically intubated vent settings  currently FiO2 55%/PEEP 12.  Palliative Care working with family regarding goals of care  03/06/22: MRI Brain negative.   Interim History / Subjective:  As outlined above under significant events   Objective   Blood pressure (!) 163/85, pulse 90, temperature 99.2 F (37.3 C), temperature source Axillary, resp. rate 16, height 5' 8"  (1.727 m), weight (!) 137.2 kg, SpO2 94 %.    Vent Mode: PCV FiO2 (%):  [40 %-55 %] 50 % Set Rate:  [16 bmp] 16 bmp PEEP:  [10 OHY07-37 cmH20] 10 cmH20 Pressure Support:  [10 cmH20] 10 cmH20 Plateau Pressure:  [20 cmH20-24 cmH20] 21 cmH20   Intake/Output Summary (Last 24 hours) at 03/06/2022 0514 Last data filed at 03/06/2022 0438 Gross per 24 hour  Intake 3602.3 ml  Output 1625 ml  Net 1977.3 ml    Filed Weights   03/04/22 0410 03/05/22 0358 03/06/22 0419  Weight: (!) 136.7 kg (!) 137.2 kg (!) 137.2 kg    Examination: General: Acutely on chronically-ill appearing male, NAD mechanically intubated  HEENT: Supple, no JVD present  Neuro: RASS: -3 (requiring deeper sedation due to high vent requirements), unable to follow commands, PERRL CV: Sinus tachycardia, rrr, no r/g, 2+ radial/2+ distal pulses, 1+ bilateral lower extremity edema  Pulm: Faint rhonchi throughout, even, non labored, synchronous with vent GI: +BS x4, obese, soft, non distended  GU: Foley in place with dark yellow urine Skin: No rashes or lesions present Extremities: Normal bulk and tone  Resolved Hospital Problem list   Hypotension secondary to possible sepsis vs sedation related   Assessment & Plan:   Acute on Chronic Hypoxic / Hypercapnic Respiratory Failure suspect multifocal in the setting  of suspected COPD exacerbation, decompensated HFpEF and rhinovirus Tobacco Abuse PMHx: COPD, morbid obesity, tobacco abuse, HFpEF   CTA Chest 08/27: new bilateral lower lobe collapse or consolidation; negative for central PE  - Full vent support, implement lung protective strategies -  Plateau pressures less than 30 cm H20 - Wean FiO2 & PEEP as tolerated to maintain O2 sats 88 to 92% - Follow intermittent Chest X-ray & ABG as needed - Spontaneous Breathing Trials when respiratory parameters met and mental status permits - VAP Bundle - Recruitment maneuvers - Bronchodilators & Pulmicort nebs - Continue iv steroids  - Prn proning to improve VQ matching and recruit bases  - Completed course of ABX as above - Repeat tracheal aspirate results pending   Acute on Chronic HFpEF exacerbation Mildly elevated Troponin, suspect demand ischemia Query Pulmonary HTN PMHx: HFpEF  Echocardiogram 02/24/22: LVEF 60-65%, Grade II DD - Continuous  telemetry monitoring - Maintain MAP >65 - Trend HS Troponin until downtrending (59 ~ 71 ~ 143 ~ 221 ~) - Diurese as BP and renal function permits to keep net negative: will give a dose of diamox and metolazone  Rhinovirus - Trend WBC and monitor fever curve  - Completed course of abx therapy due to suspected bacterial pneumonia, however tracheal aspirate with normal respiratory flora  Polycythemia suspected secondary to chronic hypoxia ~improving  Followed by oncology outpatient, last seen 10/2019. He was offered a sleep study which he declined. At that time, he was deemed not a candidate for continued follow up as Hgb had stabilized at 17.0 & HCT was 51%.  Hgb: 18.9, Hct: 63.3 - Daily CBC - Spoke with on call oncology NP on 08/20: no interventions required at this time due to CTA Chest negative for PE and pts noncompliance with outpatient oxygen has likely worsened polycythemia.  Once pts oxygenation improves this will likely improve polycythemia pt can follow-up with hematology in outpatient setting.  Official hematology consult canceled   Type 2 Diabetes Mellitus Hemoglobin A1C: 6.5 - Monitor CBG Q 4 hours - SSI resistant dosing - Target range while in ICU: 140-180 - Follow ICU hyper/hypo-glycemia protocol  Acute Metabolic  Encephalopathy Sedation needs in the setting of mechanical ventilation  CT Head negative 8/19 Follow up MRI Brain negative 8/30 - Treatment of metabolic derangements as outlined above - Maintain a RASS goal -1 - Continue versed and fentanyl gtts to maintain RASS goal; continue valium per tube  - Daily wake up assessment   Pt is critically ill.  Prognosis is guarded.  High risk for cardiac arrest and death.  Palliative Care is following for GOC. If family decides to proceed with aggressive care pt will need tracheostomy   Best Practice (right click and "Reselect all SmartList Selections" daily)  Diet/type: NPO w/ meds via tube; tube feeds DVT prophylaxis: Arixtra GI prophylaxis: PPI Lines: Left radial arterial line; Left IJ central line  Foley:  Yes, and it is still needed Code Status:  full code Last date of multidisciplinary goals of care discussion [03/05/22]  Labs   CBC: Recent Labs  Lab 03/01/22 0556 03/02/22 0311 03/03/22 0447 03/04/22 0500 03/05/22 0517  WBC 6.6 6.4 5.3 5.9 6.3  NEUTROABS  --   --   --   --  4.5  HGB 17.3* 16.7 17.3* 17.5* 17.2*  HCT 57.8* 55.6* 56.7* 58.7* 56.5*  MCV 93.2 91.6 91.7 91.7 91.7  PLT 180 206 235 245 245     Basic Metabolic Panel: Recent Labs  Lab 03/01/22  5102 03/02/22 0311 03/03/22 0447 03/04/22 0500 03/05/22 0517  NA 136 136 139 141 138  K 3.7 3.7 3.3* 3.9 3.3*  CL 97* 99 96* 95* 98  CO2 31 32 35* 34* 30  GLUCOSE 134* 147* 114* 150* 131*  BUN 29* 35* 36* 37* 40*  CREATININE 0.63 0.78 0.66 0.66 0.67  CALCIUM 8.4* 8.7* 9.1 9.1 9.3  MG 2.4 2.5* 1.9 1.9 1.9  PHOS 4.1 3.6 3.0 3.0 3.9    GFR: Estimated Creatinine Clearance: 134.9 mL/min (by C-G formula based on SCr of 0.67 mg/dL). Recent Labs  Lab 03/02/22 0311 03/03/22 0447 03/04/22 0500 03/05/22 0517  PROCALCITON  --  0.17  --  0.20  WBC 6.4 5.3 5.9 6.3     Liver Function Tests: No results for input(s): "AST", "ALT", "ALKPHOS", "BILITOT", "PROT", "ALBUMIN" in  the last 168 hours. No results for input(s): "LIPASE", "AMYLASE" in the last 168 hours. No results for input(s): "AMMONIA" in the last 168 hours.  ABG    Component Value Date/Time   PHART 7.4 03/05/2022 0500   PCO2ART 53 (H) 03/05/2022 0500   PO2ART 83 03/05/2022 0500   HCO3 32.8 (H) 03/05/2022 0500   TCO2 39 (H) 01/13/2018 0006   O2SAT 97.9 03/05/2022 0500     Coagulation Profile: No results for input(s): "INR", "PROTIME" in the last 168 hours.  Cardiac Enzymes: No results for input(s): "CKTOTAL", "CKMB", "CKMBINDEX", "TROPONINI" in the last 168 hours.  HbA1C: Hemoglobin A1C  Date/Time Value Ref Range Status  02/13/2018 01:52 PM 7.5 (A) 4.0 - 5.6 % Final   Hgb A1c MFr Bld  Date/Time Value Ref Range Status  02/23/2022 10:05 PM 6.5 (H) 4.8 - 5.6 % Final    Comment:    (NOTE) Pre diabetes:          5.7%-6.4%  Diabetes:              >6.4%  Glycemic control for   <7.0% adults with diabetes   01/13/2018 09:53 AM 7.3 (H) 4.8 - 5.6 % Final    Comment:    (NOTE) Pre diabetes:          5.7%-6.4% Diabetes:              >6.4% Glycemic control for   <7.0% adults with diabetes     CBG: Recent Labs  Lab 03/05/22 1130 03/05/22 1620 03/05/22 1936 03/05/22 2332 03/06/22 0333  GLUCAP 215* 243* 167* 155* 166*     Review of Systems:   UTA-patient intubated and sedated  Past Medical History:  He,  has a past medical history of (HFpEF) heart failure with preserved ejection fraction ([place]), Allergic reaction (12/15/2016), COPD (chronic obstructive pulmonary disease) ([place]), Hyperglycemia (12/2016), Hypertension, and Polycythemia.   Surgical History:   Past Surgical History:  Procedure Laterality Date   KNEE SURGERY Right      Social History:   reports that he has been smoking cigars. He has never used smokeless tobacco. He reports current alcohol use. He reports that he does not use drugs.   Family History:  His family history includes CAD in his brother and  father.   Allergies Allergies  Allergen Reactions   Other Anaphylaxis    McCormick's Rub (Food)   Beef-Derived Products Itching    Alpha gal allergy   Pork-Derived Products Hives    Alpha gal     Home Medications  Prior to Admission medications   Medication Sig Start Date End Date Taking? Authorizing Provider  albuterol (  PROVENTIL HFA;VENTOLIN HFA) 108 (90 Base) MCG/ACT inhaler Inhale 2 puffs into the lungs every 6 (six) hours as needed for wheezing or shortness of breath. 02/03/18   Azzie Glatter, FNP  aspirin 81 MG tablet Take 81 mg by mouth daily.    [provider]  blood glucose meter kit and supplies Dispense based on patient and insurance preference. Use up to four times daily as directed. (FOR ICD-10 E10.9, E11.9). 01/15/18   Eugenie Filler, MD  buPROPion (WELLBUTRIN SR) 150 MG 12 hr tablet Take 150 mg by mouth 2 (two) times daily.    [provider]  diphenhydrAMINE (BENADRYL) 25 MG tablet Take 1 tablet (25 mg total) by mouth every 6 (six) hours. Patient taking differently: Take 25 mg by mouth daily.  09/07/17   Jola Schmidt, MD  fluticasone (FLONASE) 50 MCG/ACT nasal spray Place 2 sprays into both nostrils daily. 01/16/18   Eugenie Filler, MD  folic acid (FOLVITE) 1 MG tablet Take 1 tablet (1 mg total) by mouth daily. Patient not taking: Reported on 02/03/2018 01/16/18   Eugenie Filler, MD  furosemide (LASIX) 40 MG tablet Take 1 tablet (40 mg total) by mouth daily. 02/03/18   Azzie Glatter, FNP  meloxicam (MOBIC) 15 MG tablet Take 15 mg by mouth daily.    [provider]  phentermine 15 MG capsule Take 15 mg by mouth every morning.    [provider]  traMADol (ULTRAM) 50 MG tablet Take 1 tablet (50 mg total) by mouth every 8 (eight) hours as needed. 02/13/18   Azzie Glatter, FNP  umeclidinium bromide (INCRUSE ELLIPTA) 62.5 MCG/INH AEPB Inhale 1 puff into the lungs daily. Patient not taking: Reported on 07/28/2019 02/11/18    Azzie Glatter, FNP  Scheduled Meds:  budesonide (PULMICORT) nebulizer solution  0.25 mg Nebulization BID   Chlorhexidine Gluconate Cloth  6 each Topical Daily   diazepam  5 mg Per Tube Q6H   docusate  100 mg Per Tube BID   fondaparinux (ARIXTRA) injection  2.5 mg Subcutaneous Q24H   free water  100 mL Per Tube Q4H   insulin aspart  0-20 Units Subcutaneous Q4H   insulin aspart  3 Units Subcutaneous Q4H   insulin glargine-yfgn  10 Units Subcutaneous QHS   ipratropium-albuterol  3 mL Nebulization Q6H   methylPREDNISolone (SOLU-MEDROL) injection  40 mg Intravenous Daily   nicotine  21 mg Transdermal Daily   mouth rinse  15 mL Mouth Rinse Q2H   pantoprazole  40 mg Per Tube Daily   polyethylene glycol  17 g Per Tube Daily   sodium chloride flush  3 mL Intravenous Q12H   Continuous Infusions:  sodium chloride     sodium chloride Stopped (02/23/22 2350)   feeding supplement (PIVOT 1.5 CAL) 70 mL/hr at 03/06/22 0438   fentaNYL infusion INTRAVENOUS 100 mcg/hr (03/06/22 0438)   midazolam 3 mg/hr (03/06/22 0438)   norepinephrine (LEVOPHED) Adult infusion Stopped (03/03/22 1725)   PRN Meds:.sodium chloride, acetaminophen, acetaminophen, bisacodyl, docusate, fentaNYL, fentaNYL (SUBLIMAZE) injection, ipratropium-albuterol, midazolam, midazolam, ondansetron (ZOFRAN) IV, mouth rinse, sodium chloride flush, vecuronium   Critical care time: 35 minutes     Rufina Falco, DNP, CCRN, FNP-C, AGACNP-BC Acute Care & Family Nurse Practitioner  [facility]  See Amion for personal pager PCCM on call pager [phone] until 7 am

## 2022-03-06 NOTE — Progress Notes (Addendum)
Patient niece at bedside and asked for update.  This nurse explained that she was not on contact list and that Bryan Mitchell the sister has meeting scheduled with Dr. Belia Heman this afternoon. After finishing mouth care and administering scheduled insulin niece verbalized that Bryan Mitchell had text ed and said no one had updated her.  This nurse explained to niece that Bryan Mitchell had been updated and the questions asked were out of nursing scope to answer.  This nurse also informed niece again that Bryan Mitchell would be meeting with Dr. Belia Heman today at 6pm

## 2022-03-06 NOTE — Progress Notes (Signed)
Verbal order received from Dr. Belia Heman to place chest x-ray.

## 2022-03-06 NOTE — Progress Notes (Signed)
Chalmers Cater called for update on patient.  This nurse was in another patients room.  This nurse asked Lupita Leash to call back in 15 mintues for update.  Lupita Leash verbalized that she would.

## 2022-03-07 LAB — CBC WITH DIFFERENTIAL/PLATELET
Abs Immature Granulocytes: 0.02 10*3/uL (ref 0.00–0.07)
Basophils Absolute: 0 10*3/uL (ref 0.0–0.1)
Basophils Relative: 0 %
Eosinophils Absolute: 0.1 10*3/uL (ref 0.0–0.5)
Eosinophils Relative: 2 %
HCT: 56.1 % — ABNORMAL HIGH (ref 39.0–52.0)
Hemoglobin: 17 g/dL (ref 13.0–17.0)
Immature Granulocytes: 0 %
Lymphocytes Relative: 21 %
Lymphs Abs: 1.3 10*3/uL (ref 0.7–4.0)
MCH: 28.2 pg (ref 26.0–34.0)
MCHC: 30.3 g/dL (ref 30.0–36.0)
MCV: 93 fL (ref 80.0–100.0)
Monocytes Absolute: 0.6 10*3/uL (ref 0.1–1.0)
Monocytes Relative: 9 %
Neutro Abs: 4.3 10*3/uL (ref 1.7–7.7)
Neutrophils Relative %: 68 %
Platelets: 217 10*3/uL (ref 150–400)
RBC: 6.03 MIL/uL — ABNORMAL HIGH (ref 4.22–5.81)
RDW: 17.3 % — ABNORMAL HIGH (ref 11.5–15.5)
WBC: 6.4 10*3/uL (ref 4.0–10.5)
nRBC: 0 % (ref 0.0–0.2)

## 2022-03-07 LAB — BASIC METABOLIC PANEL
Anion gap: 6 (ref 5–15)
Anion gap: 8 (ref 5–15)
BUN: 49 mg/dL — ABNORMAL HIGH (ref 6–20)
BUN: 69 mg/dL — ABNORMAL HIGH (ref 6–20)
CO2: 34 mmol/L — ABNORMAL HIGH (ref 22–32)
CO2: 30 mmol/L (ref 22–32)
Calcium: 9.8 mg/dL (ref 8.9–10.3)
Calcium: 9.7 mg/dL (ref 8.9–10.3)
Chloride: 104 mmol/L (ref 98–111)
Chloride: 104 mmol/L (ref 98–111)
Creatinine, Ser: 0.82 mg/dL (ref 0.61–1.24)
Creatinine, Ser: 1.11 mg/dL (ref 0.61–1.24)
GFR, Estimated: >60 mL/min (ref >60)
GFR, Estimated: >60 mL/min (ref >60)
Glucose, Bld: 121 mg/dL — ABNORMAL HIGH (ref 70–99)
Glucose, Bld: 263 mg/dL — ABNORMAL HIGH (ref 70–99)
Potassium: 3.6 mmol/L (ref 3.5–5.1)
Potassium: 4.5 mmol/L (ref 3.5–5.1)
Sodium: 144 mmol/L (ref 135–145)
Sodium: 142 mmol/L (ref 135–145)

## 2022-03-07 LAB — GLUCOSE, CAPILLARY
Glucose-Capillary: 153 mg/dL — ABNORMAL HIGH (ref 70–99)
Glucose-Capillary: 162 mg/dL — ABNORMAL HIGH (ref 70–99)
Glucose-Capillary: 165 mg/dL — ABNORMAL HIGH (ref 70–99)
Glucose-Capillary: 234 mg/dL — ABNORMAL HIGH (ref 70–99)
Glucose-Capillary: 236 mg/dL — ABNORMAL HIGH (ref 70–99)
Glucose-Capillary: 294 mg/dL — ABNORMAL HIGH (ref 70–99)

## 2022-03-07 LAB — CULTURE, RESPIRATORY W GRAM STAIN: Culture: NORMAL

## 2022-03-07 LAB — PHOSPHORUS: Phosphorus: 3.5 mg/dL (ref 2.5–4.6)

## 2022-03-07 LAB — MAGNESIUM: Magnesium: 2.1 mg/dL (ref 1.7–2.4)

## 2022-03-07 LAB — PROCALCITONIN: Procalcitonin: 0.1 ng/mL

## 2022-03-07 MED ORDER — POTASSIUM CHLORIDE 20 MEQ PO PACK
40.0000 meq | PACK | Freq: Two times a day (BID) | ORAL | Status: DC
  Administered 2022-03-07 – 2022-03-08 (×3): 40 meq
  Filled 2022-03-07 (×3): qty 2

## 2022-03-07 MED ORDER — ACETAZOLAMIDE 250 MG PO TABS
500.0000 mg | ORAL_TABLET | Freq: Once | ORAL | Status: AC
Start: 2022-03-07 — End: 2022-03-07
  Administered 2022-03-07: 500 mg
  Filled 2022-03-07: qty 2

## 2022-03-07 MED ORDER — OXYCODONE HCL 5 MG/5ML PO SOLN
5.0000 mg | Freq: Four times a day (QID) | ORAL | Status: DC | PRN
  Administered 2022-03-10 (×2): 5 mg
  Filled 2022-03-07: qty 5

## 2022-03-07 MED ORDER — DIAZEPAM 1 MG/ML PO SOLN: 2.5000 mg | Freq: Four times a day (QID) | ORAL | Status: DC | PRN

## 2022-03-07 MED ORDER — METOLAZONE 5 MG PO TABS
5.0000 mg | ORAL_TABLET | Freq: Once | ORAL | Status: AC
  Administered 2022-03-07: 5 mg
  Filled 2022-03-07: qty 1

## 2022-03-07 MED ORDER — SODIUM CHLORIDE 0.9% FLUSH
10.0000 mL | Freq: Two times a day (BID) | INTRAVENOUS | Status: DC
  Administered 2022-03-07 – 2022-03-27 (×29): 10 mL

## 2022-03-07 MED ORDER — SODIUM CHLORIDE 0.9% FLUSH: 10.0000 mL | INTRAVENOUS | Status: DC | PRN

## 2022-03-07 NOTE — Consult Note (Signed)
Bryan Mitchell, Bryan Mitchell [id number] 06/22/1961 Erin Fulling, MD  Reason for Consult: Respiratory failure  HPI: 61 y.o. male presented to ER with respiratory failure requiring intubation.  History of untreated OSA, obesity, COPD, Tobacco use, and Alpha Gal.  He has continues to require ventilatory support and consulted for evaluation of possible tracheostomy tube placement.  Per patient's sister, patient has a distrust of doctors.  She reports he was doing fine until recent Rhinovirus cold that then began his breathing difficulty.    Allergies:  Allergies  Allergen Reactions   Other Anaphylaxis    McCormick's Rub (Food)   Beef-Derived Products Itching    Alpha gal allergy   Pork-Derived Products Hives    Alpha gal    ROS: Review of systems normal other than 12 systems except per HPI.  PMH:  Past Medical History:  Diagnosis Date   (HFpEF) heart failure with preserved ejection fraction (HCC)    Allergic reaction 12/15/2016   COPD (chronic obstructive pulmonary disease) (H[facility])    Hyperglycemia 12/2016   Hypertension    Polycythemia     FH:  Family History  Problem Relation Age of Onset   CAD Father    CAD Brother     SH:  Social History   Socioeconomic History   Marital status: Single    Spouse name: Not on file   Number of children: Not on file   Years of education: Not on file   Highest education level: Not on file  Occupational History   Not on file  Tobacco Use   Smoking status: Every Day    Types: Cigars   Smokeless tobacco: Never  Vaping Use   Vaping Use: Never used  Substance and Sexual Activity   Alcohol use: Yes   Drug use: No   Sexual activity: Not on file  Other Topics Concern   Not on file  Social History Narrative   Not on file   Social Determinants of Health   Financial Resource Strain: Not on file  Food Insecurity: Not on file  Transportation Needs: Not on file  Physical Activity: Not on file  Stress: Not on file  Social Connections: Not on  file  Intimate Partner Violence: Not on file    PSH:  Past Surgical History:  Procedure Laterality Date   KNEE SURGERY Right     Physical  Exam:  GEN- sedated and unresponsive NEURO-  unable to assess due to sedation EARS- external ears clear NOSE-  no mucopus or polyps OC/OP-  ETT in place and secure NECK-  short neck, normal palpable landmarks  A/P: Respiratory Failure with need for long term ventilatory support  Plan:  Discussed extensively plan with patient's sister Bryan Mitchell regarding tracheostomy tube placement and risks and benefits associated with placement.  Discussed that it may be temporary or permanent and that initially he would be at risk of bleeding as well as increased secretions and infection.  Discussed that it may be temporary or permanent and that it would also help his untreated OSA as well.  She understands and reports that another family member had a tracheostomy following thyroid cancer and she is aware of some of the needed care.  She was also asking about his overall prognosis, and I directed her to talk with his primary medical team regarding that.  She agrees to plan for next week but is hoping he will not need it.  Plan is to proceed with tracheostomy placement on 9/7 at 730.  Please hold anticoagulation and  tube feeds prior.   Bryan Mitchell 03/07/2022 12:19 PM

## 2022-03-07 NOTE — Progress Notes (Signed)
NAME:  Bryan Mitchell, MRN:  [id number], DOB:  1961-02-23, LOS: 12 ADMISSION DATE:  02/23/2022, CONSULTATION DATE:  02/23/22 REFERRING MD:  Dr. Toy Cookey, CHIEF COMPLAINT:  SOB and fatigue   History of Present Illness:  61 yo M presenting to [facility] ED on 02/23/22 from home in a wheelchair with his sister for evaluation of worsening fatigue, poor appetite and SOB over the past 2 weeks. Per ED documentation, patient also complained of worsening bilateral leg swelling and pain.  Sister interviewed over the telephone providing supplemental history. She described that he lives alone, and began not feeling good for about 2 weeks with complaints of dyspnea and fatigue. He self-treated with Mucinex and took a COVID test which was negative. He started having increased congestion and poor PO intake. On the night of 8/18 the sister spoke with the patient on the phone and he told her, "I feel like I'm dying". Today on 8/19 she convinced the patient to go to the hospital to be evaluated. She reports he does not trust doctors and has difficulty following outpatient medication regimens. He was prescribed home O2, and has oxygen tanks at the house but has not used them in 2 years per his sister. He also injured his RIGHT knee a few weeks ago and had been getting around with a crutch. She reported he had been a little more confused on 8/19.  Per ED documentation, he endorsed a productive cough, but denied fever/chills. Patient admitted to not taking his Lasix when interviewed by [facility] service, per their documentation. And he has noticed increased pedal edema recently.  ED course: Upon arrival in triage the patient was profoundly hypoxic with his SpO2 was in the 60's on RA, this initially recovered to 99% on 3 L [place]. ABG revealed partially compensated hypercapnia and he was placed on BIPAP. He received Lasix for diuresis, BNP was mildly elevated. CT angio negative for pulmonary edema/effusions, negative for PE. [facility] service  consulted for admission. After a couple of hours on BIPAP, patient became combative and agitated, ripping off the BIPAP mask. Patient emergently intubated in ED. PCCM consulted. Medications given: IV contrast, Duo-nebs, lasix, fondaparinux, doxycycline, fentanyl, versed, ketamine, rocuronium & succinylcholine for intubation, solumedrol, propofol drip started Initial Vitals: 97.9, 20, 100, 117/68 & SpO2 62% on RA Significant labs: (Labs/ Imaging personally reviewed) I, Domingo Pulse Rust[place], AGACNP-BC, personally viewed and interpreted this ECG. EKG Interpretation: Date: 02/23/22, EKG Time: 16:27, Rate: 98, Rhythm: NSR, QRS Axis:  borderline RAD, Intervals: normal, ST/T Wave abnormalities: inferior T wave inversions, Narrative Interpretation: NSR with borderline RAD and probable LAE with inferior T wave inversions Chemistry: Na+: 139, K+: 3.8, BUN/Cr.: 31/0.79, Serum CO2/ AG: 32/8 Hematology: WBC: 6.9, Hgb: 18.9, Hct: 63.3, plt: 178 BNP: 283.7, PCT: 0.22, COVID-19 & Influenza A/B: negative ABG: 7.25/ 99/ 59/ 43.4 CXR 02/23/22: pulmonary hypoinflation. Central pulmonary vascular engorgement without overt pulmonary edema. Stable tubes CT 02/23/22: no evidence of pulmonary embolism. Mild cardiomegaly, CAD, main pulmonary trunk is dilated measuring 3.9 cm suggesting pulmonary arterial hypertension. Mild bibasilar atelectasis. No evidence of pulmonary edema or pleural effusion. CT head wo contrast 02/23/22: pending  PCCM consulted due to worsening acute on chronic hypoxic/hypercapnic respiratory failure requiring emergent intubation and mechanical ventilatory support.  Pertinent  Medical History  T2DM COPD Morbid obesity Tobacco abuse (cigars, 40 pack year history) Secondary Polycythemia HFpEF  Micro Data:  8/19: SARS-CoV-2 & Influenza PCR>>negative 8/19: RVP>> Rhinovirus/Enterovirus 8/19: MRSA PCR>> negative 8/20: Tracheal aspirate>>normal flora 8/28: Tracheal aspirate >> 8/31:  Repeat  Tracheal aspirate  Antimicrobials:  Ceftriaxone 8/19>>8/23 Doxycycline 8/19>>8/23  Significant Hospital Events: Including procedures, antibiotic start and stop dates in addition to other pertinent events   02/23/22: Admit to ICU with worsening acute on chronic hypoxic/hypercapnic respiratory failure s/t suspected COPD exacerbation in the setting of HFpEF and possible pulmonary hypertension requiring emergent intubation and mechanical ventilatory support. 02/24/22: Pt remains mechanically intubated settings decreased: PEEP 5/FiO2 40%.   02/25/22: Increased vent requirements this morning due to diffuse pulmonary vascular congestion, received lasix. Tracheal aspirate with gram + cocci & rods, gram - rods.  Severe Delirium upon WUA. 02/26/22: Remains on vent, 60% fio2 & 8 PEEP.  Diurese with Diamox + Metolazone. 02/27/22: No acute events overnight; remains mechanically intubated current vent settings: 40%/8 PEEP  02/28/22: Pts O2 requirements increased overnight due to hypoxia vent settings: FiO2 50%/PEEP 8 unable to perform SBT  03/01/22: Overnight with hypoxia, concern for ? Mucus plug.  Vent settings this am 100% FiO2 & 8 PEEP.  Plan for Diuresis, considering Bronchoscopy 03/02/22: Continued high vent settings (100%, 12 PEEP), start recruitment maneuvers, considering bronch.  Diurese. 03/03/22: Continued high vent settings (100% FiO2, 12 PEEP). Diuresing.  Obtain CT Angiogram Chest 03/03/22: CTA Chest revealed new bilateral lower lobe collapse or         consolidation since 02/23/2022. Very small superimposed          layering pleural effusions. No convincing acute pulmonary        edema. Suboptimal pulmonary artery contrast bolus timing. No          central pulmonary embolus. Satisfactory endotracheal and        enteric tubes. Aortic Atherosclerosis (ICD10-I70.0). 03/04/22: Pt currently proned and tolerating.  Remains on high ventilator settings: FiO2 80%/PEEP 15/PC O2 sats 99% 03/05/22: Pt remains  mechanically intubated vent settings currently FiO2 55%/PEEP 12.  Palliative Care working with family regarding goals of care  03/06/22: MRI Brain negative.  03/07/22: Remains intubated, vent settings 60% FiO2, 10 PEEP.  Consult ENT for Tracheostomy.  Low grade temperature (T max 99.8), reculture tracheal aspirate.  Diurese with Diamox + Metolazone  Interim History / Subjective:  As outlined above under significant events   Objective   Blood pressure 105/61, pulse 92, temperature 99.6 F (37.6 C), temperature source Axillary, resp. rate 16, height 5' 8"  (1.727 m), weight (!) 138.4 kg, SpO2 94 %.    Vent Mode: PCV FiO2 (%):  [50 %-60 %] 60 % Set Rate:  [16 bmp] 16 bmp PEEP:  [10 cmH20] 10 cmH20 Plateau Pressure:  [21 cmH20] 21 cmH20   Intake/Output Summary (Last 24 hours) at 03/07/2022 0739 Last data filed at 03/07/2022 2951 Gross per 24 hour  Intake 2260.65 ml  Output 1525 ml  Net 735.65 ml    Filed Weights   03/05/22 0358 03/06/22 0419 03/07/22 0446  Weight: (!) 137.2 kg (!) 137.2 kg (!) 138.4 kg    Examination: General: Acutely on chronically-ill appearing male, NAD mechanically intubated  HEENT: Supple, no JVD present  Neuro: RASS: -3 (requiring deeper sedation due to high vent requirements), unable to follow commands, PERRL CV: Sinus tachycardia, rrr, no r/g, 2+ radial/2+ distal pulses, 1+ bilateral lower extremity edema  Pulm: coarse throughout, even, non labored, synchronous with vent GI: +BS x4, obese, soft, non distended  GU: External male catheter in place draining yellow urine Skin: No rashes or lesions present Extremities: Normal bulk and tone  Resolved Hospital Problem list   Hypotension secondary to possible  sepsis vs sedation related   Assessment & Plan:   Acute on Chronic Hypoxic / Hypercapnic Respiratory Failure suspect multifocal in the setting of suspected COPD exacerbation, decompensated HFpEF and rhinovirus Tobacco Abuse PMHx: COPD, morbid obesity,  tobacco abuse, HFpEF   CTA Chest 08/27: new bilateral lower lobe collapse or consolidation; negative for central PE  - Full vent support, implement lung protective strategies - Plateau pressures less than 30 cm H20 - Wean FiO2 & PEEP as tolerated to maintain O2 sats 88 to 92% - Follow intermittent Chest X-ray & ABG as needed - Spontaneous Breathing Trials when respiratory parameters met and mental status permits - VAP Bundle - Recruitment maneuvers - Bronchodilators & Pulmicort nebs - Continue iv steroids (40 mg Solumedrol daily) - Prn proning to improve VQ matching and recruit bases  - Completed course of ABX as above - Repeat tracheal aspirate results pending  - Diuresis as BP and renal function permits  Acute on Chronic HFpEF exacerbation Mildly elevated Troponin, suspect demand ischemia Query Pulmonary HTN PMHx: HFpEF  Echocardiogram 02/24/22: LVEF 60-65%, Grade II DD - Continuous  telemetry monitoring - Maintain MAP >65 - Trend HS Troponin until downtrending (59 ~ 71 ~ 143 ~ 221 ~) - Diurese as BP and renal function permits to keep net negative: will give a dose of diamox and metolazone 8/31  Rhinovirus - Trend WBC and monitor fever curve  - Completed course of abx therapy due to suspected bacterial pneumonia, however tracheal aspirate with normal respiratory flora  Polycythemia suspected secondary to chronic hypoxia ~improving  Followed by oncology outpatient, last seen 10/2019. He was offered a sleep study which he declined. At that time, he was deemed not a candidate for continued follow up as Hgb had stabilized at 17.0 & HCT was 51%.  Hgb: 18.9, Hct: 63.3 - Daily CBC - Spoke with on call oncology NP on 08/20: no interventions required at this time due to CTA Chest negative for PE and pts noncompliance with outpatient oxygen has likely worsened polycythemia.  Once pts oxygenation improves this will likely improve polycythemia pt can follow-up with hematology in outpatient  setting.  Official hematology consult canceled   Type 2 Diabetes Mellitus Hemoglobin A1C: 6.5 - Monitor CBG Q 4 hours - SSI resistant dosing - Target range while in ICU: 140-180 - Follow ICU hyper/hypo-glycemia protocol  Acute Metabolic Encephalopathy Sedation needs in the setting of mechanical ventilation  CT Head negative 8/19 Follow up MRI Brain negative 8/30 - Treatment of metabolic derangements as outlined above - Maintain a RASS goal -1 - Continue versed and fentanyl gtts to maintain RASS goal; continue valium per tube  - Daily wake up assessment   Pt is critically ill.  Prognosis is guarded.  High risk for cardiac arrest and death.  Palliative Care is following for GOC. If family decides to proceed with aggressive care pt will need tracheostomy   Best Practice (right click and "Reselect all SmartList Selections" daily)  Diet/type: NPO w/ meds via tube; tube feeds DVT prophylaxis: Arixtra GI prophylaxis: PPI Lines: Left IJ central line and is still needed Foley:  N/a (external catheter in place) Code Status:  full code Last date of multidisciplinary goals of care discussion [03/07/22]  Labs   CBC: Recent Labs  Lab 03/03/22 0447 03/04/22 0500 03/05/22 0517 03/06/22 0506 03/07/22 0442  WBC 5.3 5.9 6.3 7.0 6.4  NEUTROABS  --   --  4.5 4.8 4.3  HGB 17.3* 17.5* 17.2* 17.2* 17.0  HCT 56.7*  58.7* 56.5* 57.5* 56.1*  MCV 91.7 91.7 91.7 92.6 93.0  PLT 235 245 245 245 217     Basic Metabolic Panel: Recent Labs  Lab 03/03/22 0447 03/04/22 0500 03/05/22 0517 03/06/22 0506 03/07/22 0442  NA 139 141 138 140 144  K 3.3* 3.9 3.3* 3.0* 3.6  CL 96* 95* 98 100 104  CO2 35* 34* 30 32 34*  GLUCOSE 114* 150* 131* 142* 121*  BUN 36* 37* 40* 39* 49*  CREATININE 0.66 0.66 0.67 0.62 0.82  CALCIUM 9.1 9.1 9.3 9.8 9.8  MG 1.9 1.9 1.9 1.8 2.1  PHOS 3.0 3.0 3.9 2.2* 3.5    GFR: Estimated Creatinine Clearance: 132.3 mL/min (by C-G formula based on SCr of 0.82 mg/dL). Recent  Labs  Lab 03/03/22 0447 03/04/22 0500 03/05/22 0517 03/06/22 0506 03/07/22 0442  PROCALCITON 0.17  --  0.20  --   --   WBC 5.3 5.9 6.3 7.0 6.4     Liver Function Tests: No results for input(s): "AST", "ALT", "ALKPHOS", "BILITOT", "PROT", "ALBUMIN" in the last 168 hours. No results for input(s): "LIPASE", "AMYLASE" in the last 168 hours. No results for input(s): "AMMONIA" in the last 168 hours.  ABG    Component Value Date/Time   PHART 7.4 03/05/2022 0500   PCO2ART 53 (H) 03/05/2022 0500   PO2ART 83 03/05/2022 0500   HCO3 32.8 (H) 03/05/2022 0500   TCO2 39 (H) 01/13/2018 0006   O2SAT 97.9 03/05/2022 0500     Coagulation Profile: No results for input(s): "INR", "PROTIME" in the last 168 hours.  Cardiac Enzymes: No results for input(s): "CKTOTAL", "CKMB", "CKMBINDEX", "TROPONINI" in the last 168 hours.  HbA1C: Hemoglobin A1C  Date/Time Value Ref Range Status  02/13/2018 01:52 PM 7.5 (A) 4.0 - 5.6 % Final   Hgb A1c MFr Bld  Date/Time Value Ref Range Status  02/23/2022 10:05 PM 6.5 (H) 4.8 - 5.6 % Final    Comment:    (NOTE) Pre diabetes:          5.7%-6.4%  Diabetes:              >6.4%  Glycemic control for   <7.0% adults with diabetes   01/13/2018 09:53 AM 7.3 (H) 4.8 - 5.6 % Final    Comment:    (NOTE) Pre diabetes:          5.7%-6.4% Diabetes:              >6.4% Glycemic control for   <7.0% adults with diabetes     CBG: Recent Labs  Lab 03/06/22 1544 03/06/22 1935 03/06/22 2351 03/07/22 0336 03/07/22 0717  GLUCAP 255* 198* 171* 162* 153*     Review of Systems:   UTA-patient intubated and sedated  Past Medical History:  He,  has a past medical history of (HFpEF) heart failure with preserved ejection fraction ([place]), Allergic reaction (12/15/2016), COPD (chronic obstructive pulmonary disease) ([place]), Hyperglycemia (12/2016), Hypertension, and Polycythemia.   Surgical History:   Past Surgical History:  Procedure Laterality Date   KNEE  SURGERY Right      Social History:   reports that he has been smoking cigars. He has never used smokeless tobacco. He reports current alcohol use. He reports that he does not use drugs.   Family History:  His family history includes CAD in his brother and father.   Allergies Allergies  Allergen Reactions   Other Anaphylaxis    McCormick's Rub (Food)   Beef-Derived Products Itching    Alpha  gal allergy   Pork-Derived Products Hives    Alpha gal     Home Medications  Prior to Admission medications   Medication Sig Start Date End Date Taking? Authorizing Provider  albuterol (PROVENTIL HFA;VENTOLIN HFA) 108 (90 Base) MCG/ACT inhaler Inhale 2 puffs into the lungs every 6 (six) hours as needed for wheezing or shortness of breath. 02/03/18   Azzie Glatter, FNP  aspirin 81 MG tablet Take 81 mg by mouth daily.    [provider]  blood glucose meter kit and supplies Dispense based on patient and insurance preference. Use up to four times daily as directed. (FOR ICD-10 E10.9, E11.9). 01/15/18   Eugenie Filler, MD  buPROPion (WELLBUTRIN SR) 150 MG 12 hr tablet Take 150 mg by mouth 2 (two) times daily.    [provider]  diphenhydrAMINE (BENADRYL) 25 MG tablet Take 1 tablet (25 mg total) by mouth every 6 (six) hours. Patient taking differently: Take 25 mg by mouth daily.  09/07/17   Jola Schmidt, MD  fluticasone (FLONASE) 50 MCG/ACT nasal spray Place 2 sprays into both nostrils daily. 01/16/18   Eugenie Filler, MD  folic acid (FOLVITE) 1 MG tablet Take 1 tablet (1 mg total) by mouth daily. Patient not taking: Reported on 02/03/2018 01/16/18   Eugenie Filler, MD  furosemide (LASIX) 40 MG tablet Take 1 tablet (40 mg total) by mouth daily. 02/03/18   Azzie Glatter, FNP  meloxicam (MOBIC) 15 MG tablet Take 15 mg by mouth daily.    [provider]  phentermine 15 MG capsule Take 15 mg by mouth every morning.    [provider]  traMADol (ULTRAM) 50  MG tablet Take 1 tablet (50 mg total) by mouth every 8 (eight) hours as needed. 02/13/18   Azzie Glatter, FNP  umeclidinium bromide (INCRUSE ELLIPTA) 62.5 MCG/INH AEPB Inhale 1 puff into the lungs daily. Patient not taking: Reported on 07/28/2019 02/11/18   Azzie Glatter, FNP  Scheduled Meds:  budesonide (PULMICORT) nebulizer solution  0.25 mg Nebulization BID   Chlorhexidine Gluconate Cloth  6 each Topical Daily   docusate  100 mg Per Tube BID   fondaparinux (ARIXTRA) injection  2.5 mg Subcutaneous Q24H   free water  100 mL Per Tube Q4H   insulin aspart  0-20 Units Subcutaneous Q4H   insulin aspart  3 Units Subcutaneous Q4H   insulin glargine-yfgn  10 Units Subcutaneous QHS   ipratropium-albuterol  3 mL Nebulization Q6H   methylPREDNISolone (SOLU-MEDROL) injection  40 mg Intravenous Daily   mouth rinse  15 mL Mouth Rinse Q2H   pantoprazole  40 mg Per Tube Daily   polyethylene glycol  17 g Per Tube Daily   sodium chloride flush  3 mL Intravenous Q12H   Continuous Infusions:  sodium chloride     sodium chloride Stopped (02/23/22 2350)   feeding supplement (PIVOT 1.5 CAL) 70 mL/hr at 03/07/22 4497   fentaNYL infusion INTRAVENOUS 100 mcg/hr (03/07/22 0731)   midazolam 3 mg/hr (03/07/22 0632)   PRN Meds:.sodium chloride, acetaminophen, acetaminophen, bisacodyl, diazepam, docusate, fentaNYL, fentaNYL (SUBLIMAZE) injection, ipratropium-albuterol, midazolam, midazolam, ondansetron (ZOFRAN) IV, mouth rinse, sodium chloride flush, vecuronium   Critical care time: 40 minutes     Darel Hong, AGACNP-BC [facility] Prefer epic messenger for cross cover needs If after hours, please call E-link

## 2022-03-07 NOTE — Consult Note (Signed)
PHARMACY CONSULT NOTE  Pharmacy Consult for Electrolyte Monitoring and Replacement   Recent Labs: Potassium (mmol/L)  Date Value  03/07/2022 3.6  04/21/2012 4.8   Magnesium (mg/dL)  Date Value  95/63/8756 2.1   Calcium (mg/dL)  Date Value  43/32/9518 9.8   Calcium, Total (mg/dL)  Date Value  84/16/6063 8.8   Albumin (g/dL)  Date Value  01/60/1093 4.1  04/21/2012 4.0   Phosphorus (mg/dL)  Date Value  23/55/7322 3.5   Sodium (mmol/L)  Date Value  03/07/2022 144  04/21/2012 139   Assessment: Pharmacy has been consulted to monitor electrolytes in 60yo male presenting to the ED complaining of worsening bilateral leg swelling and pain. He was self-treating with mucinex and took a COVID test which was negative. He is currently intubated and sedated in CCU. Pharmacy is asked to follow and replace electrolytes while in CCU  Nutrition: Tube feeds @55ml /h (1.3L/d), FWF q4h (622mL/d)   Goal of Therapy:  Electrolytes within normal limits  Nutrition:  FWF 80m q4h, Tube feeds 55>65mL/hr  Plan:  Net I/O +5.9L (UOP 0.6-0.70ml/k/h); -- Aggressive intermittently dosed diuresis most recently: Lasix 40mg  x1 (8/27), metolazone 5mg  PT x1 8/28-29, acetazolamide 500mg  IV 8/28-29  K 3.0>3.6 - WNL today (had no diuretic yesterday). Planning diamox 500mg  PO & metolazone 5mg  PO x1 (8/31). Will give KCL 07-21-2003 PO x1. Mg 1.9>1.7>2.1 - WNL today, no further repletion required Other lytes remain WNL, will CTM with AM labs and adjust PRN. --Follow-up electrolytes with AM labs tomorrow  03/07/2022 8:38 AM

## 2022-03-07 NOTE — Progress Notes (Signed)
Sputum obtained and taken to lab 

## 2022-03-07 NOTE — TOC Initial Note (Signed)
Transition of Care [facility]) - Initial/Assessment Note    Patient Details  Name: Bryan Mitchell MRN: [id number] Date of Birth: [birth year]  [facility]) CM/SW Contact:    Allayne Butcher, RN Phone Number: 03/07/2022, 11:31 AM  Clinical Narrative:                 Patient remains in the ICU intubated and sedated.  Patient is from home where he lives alone.  Patient's sister, Bryan Mitchell is his closest relative and has been making decisions.  Per MD Bryan Mitchell agreed with ENT consult for tracheostomy, with care team will cont to work on trying to wean patient off the vent.     Expected Discharge Plan:  (TBD) Barriers to Discharge: Continued Medical Work up   Patient Goals and CMS Choice Patient states their goals for this hospitalization and ongoing recovery are:: Patient unable to state, remains on the vent in the ICU      Expected Discharge Plan and Services Expected Discharge Plan:  (TBD)   Discharge Planning Services: CM Consult   Living arrangements for the past 2 months: Single Family Home                                      Prior Living Arrangements/Services Living arrangements for the past 2 months: Single Family Home Lives with:: Self Patient language and need for interpreter reviewed:: Yes        Need for Family Participation in Patient Care: Yes (Comment) Care giver support system in place?: Yes (comment)   Criminal Activity/Legal Involvement Pertinent to Current Situation/Hospitalization: No - Comment as needed  Activities of Daily Living      Permission Sought/Granted      Share Information with NAME: Bryan Mitchell     Permission granted to share info w Relationship: sister  Permission granted to share info w Contact Information: [phone]  Emotional Assessment Appearance:: Appears stated age Attitude/Demeanor/Rapport: Intubated (Following Commands or Not Following Commands) Affect (typically observed): Unable to Assess   Alcohol /  Substance Use: Not Applicable Psych Involvement: No (comment)  Admission diagnosis:  Acute respiratory failure (HCC) [J96.00] Acute respiratory failure with hypoxia and hypercapnia (HCC) [J96.01, J96.02] Acute hypoxemic respiratory failure (HCC) [J96.01] Patient Active Problem List   Diagnosis Date Noted   Pressure injury of skin 03/06/2022   Acute hypoxemic respiratory failure (HCC) 02/23/2022   Acute respiratory failure (HCC) 02/23/2022   COPD exacerbation (HCC)    Type 2 diabetes mellitus with complication, without long-term current use of insulin (HCC)    Edema of lower extremity    Acute respiratory failure with hypoxia (HCC) 01/13/2018   Morbid obesity (HCC) 01/13/2018   Hypoxia 12/16/2016   Cellulitis and abscess of trunk 12/16/2016   Tobacco abuse 12/16/2016   Allergic reaction 12/16/2016   Polycythemia 12/16/2016   PCP:  Pcp, No Pharmacy:   [id number] [place], [place] - [address] [place] [address] Phone: [phone] Fax: [phone]  [facility] [address], [address] [place] [address] Phone: [phone] Fax: [phone]     Social Determinants of Health (SDOH) Interventions    Readmission Risk Interventions     No data to display

## 2022-03-08 ENCOUNTER — Inpatient Hospital Stay: Payer: Medicaid Other

## 2022-03-08 LAB — CBC
HCT: 58.5 % — ABNORMAL HIGH (ref 39.0–52.0)
Hemoglobin: 17.3 g/dL — ABNORMAL HIGH (ref 13.0–17.0)
MCH: 27.9 pg (ref 26.0–34.0)
MCHC: 29.6 g/dL — ABNORMAL LOW (ref 30.0–36.0)
MCV: 94.2 fL (ref 80.0–100.0)
Platelets: 222 10*3/uL (ref 150–400)
RBC: 6.21 MIL/uL — ABNORMAL HIGH (ref 4.22–5.81)
RDW: 18 % — ABNORMAL HIGH (ref 11.5–15.5)
WBC: 6.3 10*3/uL (ref 4.0–10.5)
nRBC: 0 % (ref 0.0–0.2)

## 2022-03-08 LAB — GLUCOSE, CAPILLARY
Glucose-Capillary: 160 mg/dL — ABNORMAL HIGH (ref 70–99)
Glucose-Capillary: 179 mg/dL — ABNORMAL HIGH (ref 70–99)
Glucose-Capillary: 192 mg/dL — ABNORMAL HIGH (ref 70–99)
Glucose-Capillary: 203 mg/dL — ABNORMAL HIGH (ref 70–99)
Glucose-Capillary: 221 mg/dL — ABNORMAL HIGH (ref 70–99)
Glucose-Capillary: 262 mg/dL — ABNORMAL HIGH (ref 70–99)

## 2022-03-08 LAB — BLOOD GAS, ARTERIAL
Acid-Base Excess: 10.4 mmol/L — ABNORMAL HIGH (ref 0.0–2.0)
Bicarbonate: 35.7 mmol/L — ABNORMAL HIGH (ref 20.0–28.0)
FIO2: 40 %
Mechanical Rate: 16
O2 Saturation: 97.6 %
PEEP: 10 cmH2O
Patient temperature: 37
Pressure control: 14 cmH2O
pCO2 arterial: 49 mmHg — ABNORMAL HIGH (ref 32–48)
pH, Arterial: 7.47 — ABNORMAL HIGH (ref 7.35–7.45)
pO2, Arterial: 80 mmHg — ABNORMAL LOW (ref 83–108)

## 2022-03-08 LAB — BASIC METABOLIC PANEL
Anion gap: 5 (ref 5–15)
BUN: 76 mg/dL — ABNORMAL HIGH (ref 6–20)
CO2: 32 mmol/L (ref 22–32)
Calcium: 9.7 mg/dL (ref 8.9–10.3)
Chloride: 108 mmol/L (ref 98–111)
Creatinine, Ser: 1.07 mg/dL (ref 0.61–1.24)
GFR, Estimated: >60 mL/min (ref >60)
Glucose, Bld: 181 mg/dL — ABNORMAL HIGH (ref 70–99)
Potassium: 4.2 mmol/L (ref 3.5–5.1)
Sodium: 145 mmol/L (ref 135–145)

## 2022-03-08 LAB — PROCALCITONIN: Procalcitonin: 0.14 ng/mL

## 2022-03-08 LAB — MAGNESIUM: Magnesium: 2.4 mg/dL (ref 1.7–2.4)

## 2022-03-08 LAB — TRIGLYCERIDES: Triglycerides: 145 mg/dL (ref <150)

## 2022-03-08 LAB — URINALYSIS, COMPLETE (UACMP) WITH MICROSCOPIC
Bilirubin Urine: NEGATIVE
Glucose, UA: NEGATIVE mg/dL
Hgb urine dipstick: NEGATIVE
Ketones, ur: NEGATIVE mg/dL
Leukocytes,Ua: NEGATIVE
Nitrite: NEGATIVE
Protein, ur: NEGATIVE mg/dL
Specific Gravity, Urine: 1.015 (ref 1.005–1.030)
Squamous Epithelial / HPF: NONE SEEN (ref 0–5)
pH: 6 (ref 5.0–8.0)

## 2022-03-08 LAB — PHOSPHORUS: Phosphorus: 4.3 mg/dL (ref 2.5–4.6)

## 2022-03-08 MED ORDER — DIPHENHYDRAMINE HCL 50 MG/ML IJ SOLN
50.0000 mg | Freq: Once | INTRAMUSCULAR | Status: AC
  Administered 2022-03-08: 50 mg via INTRAVENOUS
  Filled 2022-03-08: qty 1

## 2022-03-08 MED ORDER — INSULIN GLARGINE-YFGN 100 UNIT/ML ~~LOC~~ SOLN
15.0000 [IU] | Freq: Every day | SUBCUTANEOUS | Status: DC
  Administered 2022-03-08 – 2022-03-09 (×2): 15 [IU] via SUBCUTANEOUS
  Filled 2022-03-08 (×2): qty 0.15

## 2022-03-08 MED ORDER — OXYCODONE HCL 5 MG PO TABS
5.0000 mg | ORAL_TABLET | Freq: Four times a day (QID) | ORAL | Status: DC
  Administered 2022-03-08 – 2022-03-11 (×13): 5 mg
  Filled 2022-03-08 (×12): qty 1

## 2022-03-08 MED ORDER — METOLAZONE 5 MG PO TABS
5.0000 mg | ORAL_TABLET | Freq: Once | ORAL | Status: AC
Start: 2022-03-08 — End: 2022-03-08
  Administered 2022-03-08: 5 mg
  Filled 2022-03-08: qty 1

## 2022-03-08 MED ORDER — INSULIN ASPART 100 UNIT/ML IJ SOLN
5.0000 [IU] | INTRAMUSCULAR | Status: DC
Start: 2022-03-08 — End: 2022-03-10
  Administered 2022-03-08 – 2022-03-10 (×14): 5 [IU] via SUBCUTANEOUS
  Filled 2022-03-08 (×14): qty 1

## 2022-03-08 MED ORDER — IBUPROFEN 400 MG PO TABS
800.0000 mg | ORAL_TABLET | Freq: Three times a day (TID) | ORAL | Status: DC | PRN
  Administered 2022-03-09 (×2): 800 mg
  Filled 2022-03-08 (×2): qty 2

## 2022-03-08 MED ORDER — ACETAZOLAMIDE 250 MG PO TABS
500.0000 mg | ORAL_TABLET | Freq: Once | ORAL | Status: AC
  Administered 2022-03-08: 500 mg
  Filled 2022-03-08: qty 2

## 2022-03-08 MED ORDER — DIAZEPAM 5 MG PO TABS
5.0000 mg | ORAL_TABLET | Freq: Four times a day (QID) | ORAL | Status: DC
  Administered 2022-03-08 – 2022-03-11 (×13): 5 mg
  Filled 2022-03-08 (×13): qty 1

## 2022-03-08 NOTE — Progress Notes (Signed)
Daily Progress Note   Patient Name: Bryan Mitchell       Date: 03/08/2022 DOB: 01/23/1961  Age: 61 y.o. MRN#: [id number] Attending Physician: Erin Fulling, MD Primary Care Physician: Pcp, No Admit Date: 02/23/2022  Reason for Consultation/Follow-up: Establishing goals of care  Subjective: Notes and labs reviewed. In to see patient, no family at bedside. Per staff he is improving on ventilator settings.  Called to speak with patient's sister.  She discusses that to others from my team have already spoken with her previously.  She discusses concerns about insurance and needing to speak to a [employer] regarding his Medicare and Medicaid.    She discusses plans for placing tracheostomy on Thursday.  She tells me she is unsure if he would or would not want the tracheostomy.  Discussed with patient autonomy.  She states she feels like that he would take help if he can get it.  She also states that he is proud and does not really want to accept help but will accept it from her.  She discusses that he is a person that historically has not wanted to take medication or go see doctors.  She discusses that he is unable to drive and discusses his dyslexia.  She states she is hopeful for miracle.  I will follow-up on Monday.   Length of Stay: 13  Current Medications: Scheduled Meds:   budesonide (PULMICORT) nebulizer solution  0.25 mg Nebulization BID   Chlorhexidine Gluconate Cloth  6 each Topical Daily   diazepam  5 mg Per Tube Q6H   docusate  100 mg Per Tube BID   fondaparinux (ARIXTRA) injection  2.5 mg Subcutaneous Q24H   free water  100 mL Per Tube Q4H   insulin aspart  0-20 Units Subcutaneous Q4H   insulin aspart  5 Units Subcutaneous Q4H   insulin glargine-yfgn  10 Units Subcutaneous QHS    ipratropium-albuterol  3 mL Nebulization Q6H   methylPREDNISolone (SOLU-MEDROL) injection  40 mg Intravenous Daily   mouth rinse  15 mL Mouth Rinse Q2H   oxyCODONE  5 mg Per Tube Q6H   pantoprazole  40 mg Per Tube Daily   polyethylene glycol  17 g Per Tube Daily   sodium chloride flush  10-40 mL Intracatheter Q12H    Continuous Infusions:  sodium chloride Stopped (  02/23/22 2350)   feeding supplement (PIVOT 1.5 CAL) 1,000 mL (03/08/22 1445)   fentaNYL infusion INTRAVENOUS 100 mcg/hr (03/08/22 1052)   midazolam 3 mg/hr (03/08/22 0736)    PRN Meds: acetaminophen, acetaminophen, bisacodyl, diazepam, docusate, fentaNYL, fentaNYL (SUBLIMAZE) injection, ibuprofen, ipratropium-albuterol, midazolam, midazolam, ondansetron (ZOFRAN) IV, mouth rinse, oxyCODONE, sodium chloride flush, vecuronium  Physical Exam Constitutional:      Comments: Eyes closed.  On ventilator.             Vital Signs: BP 119/69   Pulse (!) 101   Temp (!) 100.7 F (38.2 C) (Oral)   Resp 16   Ht 5\' 8"  (1.727 m)   Wt (!) 138.9 kg   SpO2 91%   BMI 46.56 kg/m  SpO2: SpO2: 91 % O2 Device: O2 Device: Ventilator O2 Flow Rate: O2 Flow Rate (L/min): 6 L/min  Intake/output summary:  Intake/Output Summary (Last 24 hours) at 03/08/2022 1534 Last data filed at 03/08/2022 1430 Gross per 24 hour  Intake 1227.27 ml  Output 3250 ml  Net -2022.73 ml   LBM: Last BM Date : 03/06/22 Baseline Weight: Weight: (!) 145.2 kg Most recent weight: Weight: (!) 138.9 kg    Patient Active Problem List   Diagnosis Date Noted   Pressure injury of skin 03/06/2022   Acute hypoxemic respiratory failure (HCC) 02/23/2022   Acute respiratory failure (HCC) 02/23/2022   COPD exacerbation (HCC)    Type 2 diabetes mellitus with complication, without long-term current use of insulin (HCC)    Edema of lower extremity    Acute respiratory failure with hypoxia (HCC) 01/13/2018   Morbid obesity (HCC) 01/13/2018   Hypoxia 12/16/2016   Cellulitis  and abscess of trunk 12/16/2016   Tobacco abuse 12/16/2016   Allergic reaction 12/16/2016   Polycythemia 12/16/2016    Palliative Care Assessment & Plan    Recommendations/Plan: Sister discusses plan tracheostomy on Thursday.   She discusses wanting to speak to a Friday" regarding his insurance. She discusses his resistance to health care prior to this admission.  She states that she is unsure if he would want a tracheostomy or not.  Discussed importance of patient autonomy.  Code Status:    Code Status Orders  (From admission, onward)           Start     Ordered   02/23/22 2142  Full code  Continuous        02/23/22 2141           Code Status History     Date Active Date Inactive Code Status Order ID Comments User Context   02/23/2022 1902 02/23/2022 2141 Full Code 2142  [id number], MD ED   01/13/2018 0354 01/15/2018 1908 Full Code 03/18/2018  [id number], MD Inpatient   12/16/2016 0533 12/16/2016 1853 Full Code 02/15/2017  [id number], MD ED      Care plan was discussed with RN  Thank you for allowing the Palliative Medicine Team to assist in the care of this patient.   Clydie Braun, NP  Please contact Palliative Medicine Team phone at [phone] for questions and concerns.

## 2022-03-08 NOTE — Progress Notes (Signed)
Updated pt's sister Anne Hahn via telephone. Discussed slowly weaning vent support, hopeful PEEP will continue to decrease so that will be able to attempt SBT in next few days.  Discussed low grade fevers with re-culturing and obtaining venous US to rule out DVT.  She is hopeful he will recovery quickly.  Did discuss that given his severe COPD and CHF, likely recovery will be more prolonged, and likely he will need trach to continue recovery process, but will attempt SBT prior to trach next week (planned for 9/7) if respiratory parameters met.  She is appreciate of update, all questions answered.    Darel Hong, AGACNP-BC [facility] Prefer epic messenger for cross cover needs If after hours, please call E-link

## 2022-03-08 NOTE — Consult Note (Addendum)
PHARMACY CONSULT NOTE  Pharmacy Consult for Electrolyte Monitoring and Replacement   Recent Labs: Potassium (mmol/L)  Date Value  03/08/2022 4.2  04/21/2012 4.8   Magnesium (mg/dL)  Date Value  35/32/9924 2.4   Calcium (mg/dL)  Date Value  26/83/4196 9.7   Calcium, Total (mg/dL)  Date Value  22/29/7989 8.8   Albumin (g/dL)  Date Value  21/19/4174 4.1  04/21/2012 4.0   Phosphorus (mg/dL)  Date Value  02/18/4817 4.3   Sodium (mmol/L)  Date Value  03/08/2022 145  04/21/2012 139   Assessment: Pharmacy has been consulted to monitor electrolytes in 61yo male presenting to the ED complaining of worsening bilateral leg swelling and pain. He was self-treating with mucinex and took a COVID test which was negative. He is currently intubated and sedated in CCU. Pharmacy is asked to follow and replace electrolytes while in CCU  Nutrition: Tube feeds @70  ml/h, FWF q4h  Diuretics acetazolamide 500 mg per tube x 1, metolazone 5 mg per tube x 1  Goal of Therapy:  Electrolytes within normal limits   Plan:  electrolytes remain WNL, will CTM with AM labs and adjust PRN. Follow-up electrolytes with AM labs tomorrow  03/08/2022 7:06 AM

## 2022-03-08 NOTE — Inpatient Diabetes Management (Signed)
Inpatient Diabetes Program Recommendations  AACE/ADA: New Consensus Statement on Inpatient Glycemic Control (2015)  Target Ranges:  Prepandial:   less than 140 mg/dL      Peak postprandial:   less than 180 mg/dL (1-2 hours)      Critically ill patients:  140 - 180 mg/dL    Latest Reference Range & Units 03/06/22 23:51 03/07/22 03:36 03/07/22 07:17 03/07/22 11:28 03/07/22 16:00 03/07/22 19:54  Glucose-Capillary 70 - 99 mg/dL 505 (H)  7 units Novolog 162 (H)  7 units Novolog  153 (H)  7 units Novolog 234 (H)  10 units Novolog @1451  294 (H)  14 units Novolog @1818  236 (H)  10 units Novolog  10 units Semglee @2222    (H): Data is abnormally high  Latest Reference Range & Units 03/07/22 23:46 03/08/22 03:37 03/08/22 07:28  Glucose-Capillary 70 - 99 mg/dL 03/09/22 (H)  7 units Novolog 179 (H)  7 units Novolog 203 (H)  (H): Data is abnormally high    Home DM Meds: None  Current Orders: Semglee 10 units QHS      Novolog Resistant Correction Scale/ SSI (0-20 units) Q4 hours      Novolog 3 units Q4 hours    MD- Note pt getting Solumedrol 40 mg Daily + Tube Feeds running 70cc/hr  Note CBG elevations later in the day yesterday  May consider increasing the Novolog Tube Feed Coverage slightly to 5 units Q4 hours    --Will follow patient during hospitalization--  05/08/22 RN, MSN, CDCES Diabetes Coordinator Inpatient Glycemic Control Team Team Pager: [phone] (8a-5p)

## 2022-03-08 NOTE — Progress Notes (Addendum)
NAME:  Bryan Mitchell, MRN:  [id number], DOB:  02/18/1961, LOS: 87 ADMISSION DATE:  02/23/2022, CONSULTATION DATE:  02/23/22 REFERRING MD:  Dr. Toy Cookey, CHIEF COMPLAINT:  SOB and fatigue   History of Present Illness:  61 yo M presenting to [facility] ED on 02/23/22 from home in a wheelchair with his sister for evaluation of worsening fatigue, poor appetite and SOB over the past 2 weeks. Per ED documentation, patient also complained of worsening bilateral leg swelling and pain.  Sister interviewed over the telephone providing supplemental history. She described that he lives alone, and began not feeling good for about 2 weeks with complaints of dyspnea and fatigue. He self-treated with Mucinex and took a COVID test which was negative. He started having increased congestion and poor PO intake. On the night of 8/18 the sister spoke with the patient on the phone and he told her, "I feel like I'm dying". Today on 8/19 she convinced the patient to go to the hospital to be evaluated. She reports he does not trust doctors and has difficulty following outpatient medication regimens. He was prescribed home O2, and has oxygen tanks at the house but has not used them in 2 years per his sister. He also injured his RIGHT knee a few weeks ago and had been getting around with a crutch. She reported he had been a little more confused on 8/19.  Per ED documentation, he endorsed a productive cough, but denied fever/chills. Patient admitted to not taking his Lasix when interviewed by [facility] service, per their documentation. And he has noticed increased pedal edema recently.  ED course: Upon arrival in triage the patient was profoundly hypoxic with his SpO2 was in the 60's on RA, this initially recovered to 99% on 3 L [place]. ABG revealed partially compensated hypercapnia and he was placed on BIPAP. He received Lasix for diuresis, BNP was mildly elevated. CT angio negative for pulmonary edema/effusions, negative for PE. [facility] service  consulted for admission. After a couple of hours on BIPAP, patient became combative and agitated, ripping off the BIPAP mask. Patient emergently intubated in ED. PCCM consulted. Medications given: IV contrast, Duo-nebs, lasix, fondaparinux, doxycycline, fentanyl, versed, ketamine, rocuronium & succinylcholine for intubation, solumedrol, propofol drip started Initial Vitals: 97.9, 20, 100, 117/68 & SpO2 62% on RA Significant labs: (Labs/ Imaging personally reviewed) I, Domingo Pulse Rust[place], AGACNP-BC, personally viewed and interpreted this ECG. EKG Interpretation: Date: 02/23/22, EKG Time: 16:27, Rate: 98, Rhythm: NSR, QRS Axis:  borderline RAD, Intervals: normal, ST/T Wave abnormalities: inferior T wave inversions, Narrative Interpretation: NSR with borderline RAD and probable LAE with inferior T wave inversions Chemistry: Na+: 139, K+: 3.8, BUN/Cr.: 31/0.79, Serum CO2/ AG: 32/8 Hematology: WBC: 6.9, Hgb: 18.9, Hct: 63.3, plt: 178 BNP: 283.7, PCT: 0.22, COVID-19 & Influenza A/B: negative ABG: 7.25/ 99/ 59/ 43.4 CXR 02/23/22: pulmonary hypoinflation. Central pulmonary vascular engorgement without overt pulmonary edema. Stable tubes CT 02/23/22: no evidence of pulmonary embolism. Mild cardiomegaly, CAD, main pulmonary trunk is dilated measuring 3.9 cm suggesting pulmonary arterial hypertension. Mild bibasilar atelectasis. No evidence of pulmonary edema or pleural effusion. CT head wo contrast 02/23/22: pending  PCCM consulted due to worsening acute on chronic hypoxic/hypercapnic respiratory failure requiring emergent intubation and mechanical ventilatory support.  Pertinent  Medical History  T2DM COPD Morbid obesity Tobacco abuse (cigars, 40 pack year history) Secondary Polycythemia HFpEF  Micro Data:  8/19: SARS-CoV-2 & Influenza PCR>>negative 8/19: RVP>> Rhinovirus/Enterovirus 8/19: MRSA PCR>> negative 8/20: Tracheal aspirate>>normal flora 8/28: Tracheal aspirate >>normal flora  8/31:  Repeat Tracheal aspirate>> rare gram + rods  Antimicrobials:  Ceftriaxone 8/19>>8/23 Doxycycline 8/19>>8/23  Significant Hospital Events: Including procedures, antibiotic start and stop dates in addition to other pertinent events   02/23/22: Admit to ICU with worsening acute on chronic hypoxic/hypercapnic respiratory failure s/t suspected COPD exacerbation in the setting of HFpEF and possible pulmonary hypertension requiring emergent intubation and mechanical ventilatory support. 02/24/22: Pt remains mechanically intubated settings decreased: PEEP 5/FiO2 40%.   02/25/22: Increased vent requirements this morning due to diffuse pulmonary vascular congestion, received lasix. Tracheal aspirate with gram + cocci & rods, gram - rods.  Severe Delirium upon WUA. 02/26/22: Remains on vent, 60% fio2 & 8 PEEP.  Diurese with Diamox + Metolazone. 02/27/22: No acute events overnight; remains mechanically intubated current vent settings: 40%/8 PEEP  02/28/22: Pts O2 requirements increased overnight due to hypoxia vent settings: FiO2 50%/PEEP 8 unable to perform SBT  03/01/22: Overnight with hypoxia, concern for ? Mucus plug.  Vent settings this am 100% FiO2 & 8 PEEP.  Plan for Diuresis, considering Bronchoscopy 03/02/22: Continued high vent settings (100%, 12 PEEP), start recruitment maneuvers, considering bronch.  Diurese. 03/03/22: Continued high vent settings (100% FiO2, 12 PEEP). Diuresing.  Obtain CT Angiogram Chest 03/03/22: CTA Chest revealed new bilateral lower lobe collapse or         consolidation since 02/23/2022. Very small superimposed          layering pleural effusions. No convincing acute pulmonary        edema. Suboptimal pulmonary artery contrast bolus timing. No          central pulmonary embolus. Satisfactory endotracheal and        enteric tubes. Aortic Atherosclerosis (ICD10-I70.0). 03/04/22: Pt currently proned and tolerating.  Remains on high ventilator settings: FiO2 80%/PEEP 15/PC O2 sats  99% 03/05/22: Pt remains mechanically intubated vent settings currently FiO2 55%/PEEP 12.  Palliative Care working with family regarding goals of care  03/06/22: MRI Brain negative.  03/07/22: Remains intubated, vent settings 60% FiO2, 10 PEEP.  Consult ENT for Tracheostomy.  Low grade temperature (T max 99.8), reculture tracheal aspirate.  Diurese with Diamox + Metolazone 03/08/22: Low grade fever (T max 100.7), repeat TA with rare gram + rods, but PCT normal and no Leukocytosis.  Check Venous US to r/o DVT as potential cause of fever, along with UA.  Low threshold to start ABX.  Vent requirements decreased to 40% FiO2 & 10 PEEP. Creatinine remains stable, diurese with Diamox + Metolazone.  Interim History / Subjective:  As outlined above under significant events   Objective   Blood pressure 120/74, pulse 100, temperature (!) 100.7 F (38.2 C), temperature source Oral, resp. rate 16, height 5' 8"  (1.727 m), weight (!) 138.9 kg, SpO2 91 %.    Vent Mode: PCV FiO2 (%):  [40 %] 40 % Set Rate:  [16 bmp] 16 bmp PEEP:  [10 cmH20] 10 cmH20 Plateau Pressure:  [21 cmH20-23 cmH20] 23 cmH20   Intake/Output Summary (Last 24 hours) at 03/08/2022 0818 Last data filed at 03/08/2022 0736 Gross per 24 hour  Intake 1227.27 ml  Output 2150 ml  Net -922.73 ml    Filed Weights   03/06/22 0419 03/07/22 0446 03/08/22 0425  Weight: (!) 137.2 kg (!) 138.4 kg (!) 138.9 kg    Examination: General: Acutely on chronically-ill appearing male, NAD mechanically intubated  HEENT: Supple, no JVD present  Neuro: RASS: -3 (requiring deeper sedation due to high vent requirements), unable to follow commands,  PERRL CV: Sinus tachycardia, rrr, no r/g, 2+ radial/2+ distal pulses, 1+ bilateral lower extremity edema  Pulm: coarse throughout, even, non labored, synchronous with vent GI: +BS x4, obese, soft, non distended  GU: External male catheter in place draining yellow urine Skin: No rashes or lesions present Extremities:  Normal bulk and tone  Resolved Hospital Problem list   Hypotension secondary to possible sepsis vs sedation related   Assessment & Plan:   Acute on Chronic Hypoxic / Hypercapnic Respiratory Failure suspect multifocal in the setting of suspected COPD exacerbation, decompensated HFpEF and rhinovirus Tobacco Abuse PMHx: COPD, morbid obesity, tobacco abuse, HFpEF   CTA Chest 08/27: new bilateral lower lobe collapse or consolidation; negative for central PE  - Full vent support, implement lung protective strategies - Plateau pressures less than 30 cm H20 - Wean FiO2 & PEEP as tolerated to maintain O2 sats 88 to 92% - Follow intermittent Chest X-ray & ABG as needed - Spontaneous Breathing Trials when respiratory parameters met and mental status permits - VAP Bundle - Recruitment maneuvers - Bronchodilators & Pulmicort nebs - Continue iv steroids (40 mg Solumedrol daily) - Prn proning to improve VQ matching and recruit bases  - Completed course of ABX as above - Repeat tracheal aspirate results pending - Diuresis as BP and renal function permits  Acute on Chronic HFpEF exacerbation Mildly elevated Troponin, suspect demand ischemia Query Pulmonary HTN PMHx: HFpEF  Echocardiogram 02/24/22: LVEF 60-65%, Grade II DD - Continuous  telemetry monitoring - Maintain MAP >65 - Trend HS Troponin until downtrending (59 ~ 71 ~ 143 ~ 221 ~) - Diurese as BP and renal function permits to keep net negative: will give a dose of diamox and metolazone 9/1  Rhinovirus New Low Grade Fever 9/1 - Trend WBC and monitor fever curve  - Completed course of abx therapy due to suspected bacterial pneumonia, however tracheal aspirate with normal respiratory flora -Preliminary results from repeat tracheal aspirate with rare gram + cocci, however procalcitonin remains negative and remains without leukocytosis ~ low threshold to start ABX -No foley catheter in place -Will obtain Venous US to r/o potential DVT as  cause of fever, obtain UA  Polycythemia suspected secondary to chronic hypoxia ~improving  Followed by oncology outpatient, last seen 10/2019. He was offered a sleep study which he declined. At that time, he was deemed not a candidate for continued follow up as Hgb had stabilized at 17.0 & HCT was 51%.  Hgb: 18.9, Hct: 63.3 - Daily CBC - Spoke with on call oncology NP on 08/20: no interventions required at this time due to CTA Chest negative for PE and pts noncompliance with outpatient oxygen has likely worsened polycythemia.  Once pts oxygenation improves this will likely improve polycythemia pt can follow-up with hematology in outpatient setting.  Official hematology consult canceled   Type 2 Diabetes Mellitus Hemoglobin A1C: 6.5 - Monitor CBG Q 4 hours - SSI resistant dosing + Tube feeding coverage + basal - Target range while in ICU: 140-180 - Follow ICU hyper/hypo-glycemia protocol  Acute Metabolic Encephalopathy Sedation needs in the setting of mechanical ventilation  CT Head negative 8/19 Follow up MRI Brain negative 8/30 - Treatment of metabolic derangements as outlined above - Maintain a RASS goal -1 - Continue versed and fentanyl gtts to maintain RASS goal; continue valium per tube  - Daily wake up assessment   Pt is critically ill.  Prognosis is guarded.  High risk for cardiac arrest and death.  Palliative Care is  following for [place]. If family decides to proceed with aggressive care pt will need tracheostomy   Best Practice (right click and "Reselect all SmartList Selections" daily)  Diet/type: NPO w/ meds via tube; tube feeds DVT prophylaxis: Arixtra GI prophylaxis: PPI Lines: Left IJ central line and is still needed Foley:  N/a (external catheter in place) Code Status:  full code Last date of multidisciplinary goals of care discussion [03/08/22]  Labs   CBC: Recent Labs  Lab 03/04/22 0500 03/05/22 0517 03/06/22 0506 03/07/22 0442 03/08/22 0412  WBC 5.9 6.3 7.0  6.4 6.3  NEUTROABS  --  4.5 4.8 4.3  --   HGB 17.5* 17.2* 17.2* 17.0 17.3*  HCT 58.7* 56.5* 57.5* 56.1* 58.5*  MCV 91.7 91.7 92.6 93.0 94.2  PLT 245 245 245 217 222     Basic Metabolic Panel: Recent Labs  Lab 03/04/22 0500 03/05/22 0517 03/06/22 0506 03/07/22 0442 03/07/22 1835 03/08/22 0412  NA 141 138 140 144 142 145  K 3.9 3.3* 3.0* 3.6 4.5 4.2  CL 95* 98 100 104 104 108  CO2 34* 30 32 34* 30 32  GLUCOSE 150* 131* 142* 121* 263* 181*  BUN 37* 40* 39* 49* 69* 76*  CREATININE 0.66 0.67 0.62 0.82 1.11 1.07  CALCIUM 9.1 9.3 9.8 9.8 9.7 9.7  MG 1.9 1.9 1.8 2.1  --  2.4  PHOS 3.0 3.9 2.2* 3.5  --  4.3    GFR: Estimated Creatinine Clearance: 101.6 mL/min (by C-G formula based on SCr of 1.07 mg/dL). Recent Labs  Lab 03/03/22 0447 03/04/22 0500 03/05/22 0517 03/06/22 0506 03/07/22 0442 03/08/22 0412  PROCALCITON 0.17  --  0.20  --  0.10  --   WBC 5.3   < > 6.3 7.0 6.4 6.3   < > = values in this interval not displayed.     Liver Function Tests: No results for input(s): "AST", "ALT", "ALKPHOS", "BILITOT", "PROT", "ALBUMIN" in the last 168 hours. No results for input(s): "LIPASE", "AMYLASE" in the last 168 hours. No results for input(s): "AMMONIA" in the last 168 hours.  ABG    Component Value Date/Time   PHART 7.47 (H) 03/08/2022 0522   PCO2ART 49 (H) 03/08/2022 0522   PO2ART 80 (L) 03/08/2022 0522   HCO3 35.7 (H) 03/08/2022 0522   TCO2 39 (H) 01/13/2018 0006   O2SAT 97.6 03/08/2022 0522     Coagulation Profile: No results for input(s): "INR", "PROTIME" in the last 168 hours.  Cardiac Enzymes: No results for input(s): "CKTOTAL", "CKMB", "CKMBINDEX", "TROPONINI" in the last 168 hours.  HbA1C: Hemoglobin A1C  Date/Time Value Ref Range Status  02/13/2018 01:52 PM 7.5 (A) 4.0 - 5.6 % Final   Hgb A1c MFr Bld  Date/Time Value Ref Range Status  02/23/2022 10:05 PM 6.5 (H) 4.8 - 5.6 % Final    Comment:    (NOTE) Pre diabetes:           5.7%-6.4%  Diabetes:              >6.4%  Glycemic control for   <7.0% adults with diabetes   01/13/2018 09:53 AM 7.3 (H) 4.8 - 5.6 % Final    Comment:    (NOTE) Pre diabetes:          5.7%-6.4% Diabetes:              >6.4% Glycemic control for   <7.0% adults with diabetes     CBG: Recent Labs  Lab 03/07/22 1600 03/07/22 1954  03/07/22 2346 03/08/22 0337 03/08/22 0728  GLUCAP 294* 236* 165* 179* 203*     Review of Systems:   UTA-patient intubated and sedated  Past Medical History:  He,  has a past medical history of (HFpEF) heart failure with preserved ejection fraction ([place]), Allergic reaction (12/15/2016), COPD (chronic obstructive pulmonary disease) ([place]), Hyperglycemia (12/2016), Hypertension, and Polycythemia.   Surgical History:   Past Surgical History:  Procedure Laterality Date   KNEE SURGERY Right      Social History:   reports that he has been smoking cigars. He has never used smokeless tobacco. He reports current alcohol use. He reports that he does not use drugs.   Family History:  His family history includes CAD in his brother and father.   Allergies Allergies  Allergen Reactions   Other Anaphylaxis    McCormick's Rub (Food)   Beef-Derived Products Itching    Alpha gal allergy   Pork-Derived Products Hives    Alpha gal     Home Medications  Prior to Admission medications   Medication Sig Start Date End Date Taking? Authorizing Provider  albuterol (PROVENTIL HFA;VENTOLIN HFA) 108 (90 Base) MCG/ACT inhaler Inhale 2 puffs into the lungs every 6 (six) hours as needed for wheezing or shortness of breath. 02/03/18   Azzie Glatter, FNP  aspirin 81 MG tablet Take 81 mg by mouth daily.    [provider]  blood glucose meter kit and supplies Dispense based on patient and insurance preference. Use up to four times daily as directed. (FOR ICD-10 E10.9, E11.9). 01/15/18   Eugenie Filler, MD  buPROPion (WELLBUTRIN SR) 150 MG 12 hr tablet  Take 150 mg by mouth 2 (two) times daily.    [provider]  diphenhydrAMINE (BENADRYL) 25 MG tablet Take 1 tablet (25 mg total) by mouth every 6 (six) hours. Patient taking differently: Take 25 mg by mouth daily.  09/07/17   Jola Schmidt, MD  fluticasone (FLONASE) 50 MCG/ACT nasal spray Place 2 sprays into both nostrils daily. 01/16/18   Eugenie Filler, MD  folic acid (FOLVITE) 1 MG tablet Take 1 tablet (1 mg total) by mouth daily. Patient not taking: Reported on 02/03/2018 01/16/18   Eugenie Filler, MD  furosemide (LASIX) 40 MG tablet Take 1 tablet (40 mg total) by mouth daily. 02/03/18   Azzie Glatter, FNP  meloxicam (MOBIC) 15 MG tablet Take 15 mg by mouth daily.    [provider]  phentermine 15 MG capsule Take 15 mg by mouth every morning.    [provider]  traMADol (ULTRAM) 50 MG tablet Take 1 tablet (50 mg total) by mouth every 8 (eight) hours as needed. 02/13/18   Azzie Glatter, FNP  umeclidinium bromide (INCRUSE ELLIPTA) 62.5 MCG/INH AEPB Inhale 1 puff into the lungs daily. Patient not taking: Reported on 07/28/2019 02/11/18   Azzie Glatter, FNP  Scheduled Meds:  acetaZOLAMIDE  500 mg Per Tube Once   budesonide (PULMICORT) nebulizer solution  0.25 mg Nebulization BID   Chlorhexidine Gluconate Cloth  6 each Topical Daily   docusate  100 mg Per Tube BID   fondaparinux (ARIXTRA) injection  2.5 mg Subcutaneous Q24H   free water  100 mL Per Tube Q4H   insulin aspart  0-20 Units Subcutaneous Q4H   insulin aspart  3 Units Subcutaneous Q4H   insulin glargine-yfgn  10 Units Subcutaneous QHS   ipratropium-albuterol  3 mL Nebulization Q6H   methylPREDNISolone (SOLU-MEDROL) injection  40 mg  Intravenous Daily   metolazone  5 mg Per Tube Once   mouth rinse  15 mL Mouth Rinse Q2H   pantoprazole  40 mg Per Tube Daily   polyethylene glycol  17 g Per Tube Daily   potassium chloride  40 mEq Per Tube BID   sodium chloride flush  10-40 mL Intracatheter Q12H    Continuous Infusions:  sodium chloride Stopped (02/23/22 2350)   feeding supplement (PIVOT 1.5 CAL) 70 mL/hr at 03/08/22 0736   fentaNYL infusion INTRAVENOUS 100 mcg/hr (03/08/22 0736)   midazolam 3 mg/hr (03/08/22 0736)   PRN Meds:.acetaminophen, acetaminophen, bisacodyl, diazepam, diazepam, docusate, fentaNYL, fentaNYL (SUBLIMAZE) injection, ipratropium-albuterol, midazolam, midazolam, ondansetron (ZOFRAN) IV, mouth rinse, oxyCODONE, sodium chloride flush, vecuronium   Critical care time: 40 minutes     Darel Hong, AGACNP-BC [facility] Prefer epic messenger for cross cover needs If after hours, please call E-link

## 2022-03-09 ENCOUNTER — Inpatient Hospital Stay: Payer: Medicaid Other

## 2022-03-09 LAB — BASIC METABOLIC PANEL
Anion gap: 7 (ref 5–15)
BUN: 90 mg/dL — ABNORMAL HIGH (ref 6–20)
CO2: 27 mmol/L (ref 22–32)
Calcium: 9.1 mg/dL (ref 8.9–10.3)
Chloride: 113 mmol/L — ABNORMAL HIGH (ref 98–111)
Creatinine, Ser: 1.49 mg/dL — ABNORMAL HIGH (ref 0.61–1.24)
GFR, Estimated: 54 mL/min — ABNORMAL LOW (ref >60)
Glucose, Bld: 163 mg/dL — ABNORMAL HIGH (ref 70–99)
Potassium: 4.6 mmol/L (ref 3.5–5.1)
Sodium: 147 mmol/L — ABNORMAL HIGH (ref 135–145)

## 2022-03-09 LAB — CBC
HCT: 54.9 % — ABNORMAL HIGH (ref 39.0–52.0)
Hemoglobin: 16 g/dL (ref 13.0–17.0)
MCH: 28.1 pg (ref 26.0–34.0)
MCHC: 29.1 g/dL — ABNORMAL LOW (ref 30.0–36.0)
MCV: 96.3 fL (ref 80.0–100.0)
Platelets: 204 10*3/uL (ref 150–400)
RBC: 5.7 MIL/uL (ref 4.22–5.81)
RDW: 17.9 % — ABNORMAL HIGH (ref 11.5–15.5)
WBC: 6.4 10*3/uL (ref 4.0–10.5)
nRBC: 0 % (ref 0.0–0.2)

## 2022-03-09 LAB — GLUCOSE, CAPILLARY
Glucose-Capillary: 126 mg/dL — ABNORMAL HIGH (ref 70–99)
Glucose-Capillary: 197 mg/dL — ABNORMAL HIGH (ref 70–99)
Glucose-Capillary: 197 mg/dL — ABNORMAL HIGH (ref 70–99)
Glucose-Capillary: 218 mg/dL — ABNORMAL HIGH (ref 70–99)
Glucose-Capillary: 236 mg/dL — ABNORMAL HIGH (ref 70–99)
Glucose-Capillary: 262 mg/dL — ABNORMAL HIGH (ref 70–99)

## 2022-03-09 LAB — CULTURE, RESPIRATORY W GRAM STAIN

## 2022-03-09 LAB — MAGNESIUM: Magnesium: 2.8 mg/dL — ABNORMAL HIGH (ref 1.7–2.4)

## 2022-03-09 LAB — PHOSPHORUS: Phosphorus: 5.5 mg/dL — ABNORMAL HIGH (ref 2.5–4.6)

## 2022-03-09 MED ORDER — FREE WATER
200.0000 mL | Status: DC
  Administered 2022-03-09 – 2022-03-10 (×5): 200 mL

## 2022-03-09 NOTE — Progress Notes (Signed)
NAME:  Bryan Mitchell, MRN:  [id number], DOB:  03-Jun-1961, LOS: 57 ADMISSION DATE:  02/23/2022, CONSULTATION DATE:  02/23/22 REFERRING MD:  Dr. Toy Cookey, CHIEF COMPLAINT:  SOB and fatigue   History of Present Illness:  61 yo M presenting to [facility] ED on 02/23/22 from home in a wheelchair with his sister for evaluation of worsening fatigue, poor appetite and SOB over the past 2 weeks. Per ED documentation, patient also complained of worsening bilateral leg swelling and pain.  Sister interviewed over the telephone providing supplemental history. She described that he lives alone, and began not feeling good for about 2 weeks with complaints of dyspnea and fatigue. He self-treated with Mucinex and took a COVID test which was negative. He started having increased congestion and poor PO intake. On the night of 8/18 the sister spoke with the patient on the phone and he told her, "I feel like I'm dying". Today on 8/19 she convinced the patient to go to the hospital to be evaluated. She reports he does not trust doctors and has difficulty following outpatient medication regimens. He was prescribed home O2, and has oxygen tanks at the house but has not used them in 2 years per his sister. He also injured his RIGHT knee a few weeks ago and had been getting around with a crutch. She reported he had been a little more confused on 8/19.  Per ED documentation, he endorsed a productive cough, but denied fever/chills. Patient admitted to not taking his Lasix when interviewed by [facility] service, per their documentation. And he has noticed increased pedal edema recently.  ED course: Upon arrival in triage the patient was profoundly hypoxic with his SpO2 was in the 60's on RA, this initially recovered to 99% on 3 L [place]. ABG revealed partially compensated hypercapnia and he was placed on BIPAP. He received Lasix for diuresis, BNP was mildly elevated. CT angio negative for pulmonary edema/effusions, negative for PE. [facility] service  consulted for admission. After a couple of hours on BIPAP, patient became combative and agitated, ripping off the BIPAP mask. Patient emergently intubated in ED. PCCM consulted. Medications given: IV contrast, Duo-nebs, lasix, fondaparinux, doxycycline, fentanyl, versed, ketamine, rocuronium & succinylcholine for intubation, solumedrol, propofol drip started Initial Vitals: 97.9, 20, 100, 117/68 & SpO2 62% on RA Significant labs: (Labs/ Imaging personally reviewed) I, Domingo Pulse [place], AGACNP-BC, personally viewed and interpreted this ECG. EKG Interpretation: Date: 02/23/22, EKG Time: 16:27, Rate: 98, Rhythm: NSR, QRS Axis:  borderline RAD, Intervals: normal, ST/T Wave abnormalities: inferior T wave inversions, Narrative Interpretation: NSR with borderline RAD and probable LAE with inferior T wave inversions Chemistry: Na+: 139, K+: 3.8, BUN/Cr.: 31/0.79, Serum CO2/ AG: 32/8 Hematology: WBC: 6.9, Hgb: 18.9, Hct: 63.3, plt: 178 BNP: 283.7, PCT: 0.22, COVID-19 & Influenza A/B: negative ABG: 7.25/ 99/ 59/ 43.4 CXR 02/23/22: pulmonary hypoinflation. Central pulmonary vascular engorgement without overt pulmonary edema. Stable tubes CT 02/23/22: no evidence of pulmonary embolism. Mild cardiomegaly, CAD, main pulmonary trunk is dilated measuring 3.9 cm suggesting pulmonary arterial hypertension. Mild bibasilar atelectasis. No evidence of pulmonary edema or pleural effusion. CT head wo contrast 02/23/22: No acute intracranial abnormality.  PCCM consulted due to worsening acute on chronic hypoxic/hypercapnic respiratory failure requiring emergent intubation and mechanical ventilatory support.  Pertinent  Medical History  T2DM COPD Morbid obesity Tobacco abuse (cigars, 40 pack year history) Secondary Polycythemia HFpEF  Micro Data:  8/19: SARS-CoV-2 & Influenza PCR>>negative 8/19: RVP>> Rhinovirus/Enterovirus 8/19: MRSA PCR>> negative 8/20: Tracheal aspirate>>normal flora 8/28: Tracheal  aspirate >>normal flora 8/31: Repeat Tracheal aspirate>> rare gram + rods  Antimicrobials:  Ceftriaxone 8/19>>8/23 Doxycycline 8/19>>8/23  Significant Hospital Events: Including procedures, antibiotic start and stop dates in addition to other pertinent events   02/23/22: Admit to ICU with worsening acute on chronic hypoxic/hypercapnic respiratory failure s/t suspected COPD exacerbation in the setting of HFpEF and possible pulmonary hypertension requiring emergent intubation and mechanical ventilatory support. 02/24/22: Pt remains mechanically intubated settings decreased: PEEP 5/FiO2 40%.   02/25/22: Increased vent requirements this morning due to diffuse pulmonary vascular congestion, received lasix. Tracheal aspirate with gram + cocci & rods, gram - rods.  Severe Delirium upon WUA. 02/26/22: Remains on vent, 60% fio2 & 8 PEEP.  Diurese with Diamox + Metolazone. 02/27/22: No acute events overnight; remains mechanically intubated current vent settings: 40%/8 PEEP  02/28/22: Pts O2 requirements increased overnight due to hypoxia vent settings: FiO2 50%/PEEP 8 unable to perform SBT  03/01/22: Overnight with hypoxia, concern for ? Mucus plug.  Vent settings this am 100% FiO2 & 8 PEEP.  Plan for Diuresis, considering Bronchoscopy 03/02/22: Continued high vent settings (100%, 12 PEEP), start recruitment maneuvers, considering bronch.  Diurese. 03/03/22: Continued high vent settings (100% FiO2, 12 PEEP). Diuresing.  Obtain CT Angiogram Chest 03/03/22: CTA Chest revealed new bilateral lower lobe collapse or         consolidation since 02/23/2022. Very small superimposed          layering pleural effusions. No convincing acute pulmonary        edema. Suboptimal pulmonary artery contrast bolus timing. No          central pulmonary embolus. Satisfactory endotracheal and        enteric tubes. Aortic Atherosclerosis (ICD10-I70.0). 03/04/22: Pt currently proned and tolerating.  Remains on high ventilator settings:  FiO2 80%/PEEP 15/PC O2 sats 99% 03/05/22: Pt remains mechanically intubated vent settings currently FiO2 55%/PEEP 12.  Palliative Care working with family regarding goals of care  03/06/22: MRI Brain negative.  03/07/22: Remains intubated, vent settings 60% FiO2, 10 PEEP.  Consult ENT for Tracheostomy.  Low grade temperature (T max 99.8), reculture tracheal aspirate.  Diurese with Diamox + Metolazone 03/08/22: Low grade fever (T max 100.7), repeat TA with rare gram + rods, but PCT normal and no Leukocytosis.  Check Venous US to r/o DVT as potential cause of fever, along with UA.  Low threshold to start ABX.  Vent requirements decreased to 40% FiO2 & 10 PEEP. Creatinine remains stable, diurese with Diamox + Metolazone. 03/09/2022: Remains on the ventilator, FiO2 at 40%.  Continues to require 10 of PEEP.  Interim History / Subjective:  As outlined above under significant events.  No significant issues overnight.  Objective   Blood pressure 108/70, pulse 93, temperature (!) 100.5 F (38.1 C), temperature source Oral, resp. rate 16, height $RemoveBe'5\' 8"'dTsylBAMA$  (1.727 m), weight (!) 140 kg, SpO2 94 %.    Vent Mode: PCV FiO2 (%):  [40 %] 40 % Set Rate:  [16 bmp] 16 bmp PEEP:  [10 cmH20] 10 cmH20 Plateau Pressure:  [20 cmH20] 20 cmH20   Intake/Output Summary (Last 24 hours) at 03/09/2022 1043 Last data filed at 03/09/2022 0600 Gross per 24 hour  Intake 1701.33 ml  Output 2250 ml  Net -548.67 ml    Filed Weights   03/07/22 0446 03/08/22 0425 03/09/22 0500  Weight: (!) 138.4 kg (!) 138.9 kg (!) 140 kg    Examination: General: Acutely on chronically-ill appearing male, intubated, synchronous with the ventilator.  HEENT: Supple, thick, unable to assess JVD Neuro: RASS: -3 (requiring deeper sedation due to high vent requirements), unable to follow commands, PERRL CV: Sinus tachycardia, rrr, no r/g, 2+ radial/2+ distal pulses, 1+ bilateral lower extremity edema  Pulm: coarse throughout, even, non labored, synchronous  with vent GI: +BS x4, obese, soft, non distended  GU: External male catheter in place draining yellow urine Skin: No rashes or lesions present Extremities: Normal bulk and tone  Resolved Hospital Problem list   Hypotension secondary to possible sepsis vs sedation related   Assessment & Plan:   Acute on Chronic Hypoxic / Hypercapnic Respiratory Failure suspect multifocal in the setting of suspected COPD exacerbation, decompensated HFpEF and rhinovirus Tobacco Abuse PMHx: COPD, morbid obesity, tobacco abuse, HFpEF   CTA Chest 08/27: new bilateral lower lobe collapse or consolidation; negative for central PE  - Full vent support, implement lung protective strategies - Plateau pressures less than 30 cm H20 - Wean FiO2 & PEEP as tolerated to maintain O2 sats 88 to 92% - Follow intermittent Chest X-ray & ABG as needed - Spontaneous Breathing Trials when respiratory parameters met and mental status permits - VAP Bundle - Recruitment maneuvers - Bronchodilators & Pulmicort nebs - Continue iv steroids (40 mg Solumedrol daily) - PRN proning to improve VQ matching and recruit bases  - Recruitment maneuvers - Completed course of ABX as above - Repeat tracheal aspirate results pending - Diuresis as BP and renal function permits  Acute on Chronic HFpEF exacerbation Mildly elevated Troponin, due to demand ischemia - RESOLVED Query Pulmonary HTN PMHx: HFpEF  Echocardiogram 02/24/22: LVEF 60-65%, Grade II DD, Pulm HTN - Continuous  telemetry monitoring - Maintain MAP >65 - Troponins have stabilized - No diuretics due to acute kidney injury  Acute kidney injury Suspect due to overzealous diuresis Hypernatremia Increase free water Hold diuretics Foley catheter monitor I&O Concern for potential urinary retention  Rhinovirus New Low Grade Fever 9/1 - Trend WBC and monitor fever curve  - Completed course of abx therapy due to suspected bacterial pneumonia, however tracheal aspirate with  normal respiratory flora -Preliminary results from repeat tracheal aspirate with rare gram + cocci, however procalcitonin remains negative and remains without leukocytosis ~ low threshold to start ABX -No foley catheter in place, will need foley due to increased BUN/Cr. ? obstruction -Will obtain Venous US to r/o potential DVT as cause of fever, obtain UA  Polycythemia suspected secondary to chronic hypoxia ~improving  Followed by oncology outpatient, last seen 10/2019. He was offered a sleep study which he declined. At that time, he was deemed not a candidate for continued follow up as Hgb had stabilized at 17.0 & HCT was 51%.  Hgb: 18.9, Hct: 63.3 - Daily CBC - Spoke with on call oncology NP on 08/20: no interventions required at this time due to CTA Chest negative for PE and pts noncompliance with outpatient oxygen has likely worsened polycythemia.  Once pts oxygenation improves this will likely improve polycythemia pt can follow-up with hematology in outpatient setting.  Official hematology consult canceled   Type 2 Diabetes Mellitus Hemoglobin A1C: 6.5 - Monitor CBG Q 4 hours - SSI resistant dosing + Tube feeding coverage + basal - Target range while in ICU: 140-180 - Follow ICU hyper/hypo-glycemia protocol  Acute Metabolic Encephalopathy Sedation needs in the setting of mechanical ventilation  CT Head negative 8/19 Follow up MRI Brain negative 8/30 - Treatment of metabolic derangements as outlined above - Maintain a RASS goal -1 - Continue  versed and fentanyl gtts to maintain RASS goal; continue valium per tube  - Daily wake up assessment   Pt is critically ill.  Prognosis is guarded.  High risk for cardiac arrest and death.  Palliative Care is following for GOC. If family decides to proceed with aggressive care pt will need tracheostomy   Best Practice (right click and "Reselect all SmartList Selections" daily)  Diet/type: NPO w/ meds via tube; tube feeds DVT prophylaxis:  Arixtra GI prophylaxis: PPI Lines: Left IJ central line and is still needed Foley:  N/a (external catheter in place) Code Status:  full code Last date of multidisciplinary goals of care discussion [03/08/22]  Labs   CBC: Recent Labs  Lab 03/05/22 0517 03/06/22 0506 03/07/22 0442 03/08/22 0412 03/09/22 0500  WBC 6.3 7.0 6.4 6.3 6.4  NEUTROABS 4.5 4.8 4.3  --   --   HGB 17.2* 17.2* 17.0 17.3* 16.0  HCT 56.5* 57.5* 56.1* 58.5* 54.9*  MCV 91.7 92.6 93.0 94.2 96.3  PLT 245 245 217 222 204     Basic Metabolic Panel: Recent Labs  Lab 03/05/22 0517 03/06/22 0506 03/07/22 0442 03/07/22 1835 03/08/22 0412 03/09/22 0500  NA 138 140 144 142 145 147*  K 3.3* 3.0* 3.6 4.5 4.2 4.6  CL 98 100 104 104 108 113*  CO2 30 32 34* 30 32 27  GLUCOSE 131* 142* 121* 263* 181* 163*  BUN 40* 39* 49* 69* 76* 90*  CREATININE 0.67 0.62 0.82 1.11 1.07 1.49*  CALCIUM 9.3 9.8 9.8 9.7 9.7 9.1  MG 1.9 1.8 2.1  --  2.4 2.8*  PHOS 3.9 2.2* 3.5  --  4.3 5.5*    GFR: Estimated Creatinine Clearance: 73.2 mL/min (A) (by C-G formula based on SCr of 1.49 mg/dL (H)). Recent Labs  Lab 03/03/22 0447 03/04/22 0500 03/05/22 0517 03/06/22 0506 03/07/22 0442 03/08/22 0412 03/09/22 0500  PROCALCITON 0.17  --  0.20  --  0.10 0.14  --   WBC 5.3   < > 6.3 7.0 6.4 6.3 6.4   < > = values in this interval not displayed.     Liver Function Tests: No results for input(s): "AST", "ALT", "ALKPHOS", "BILITOT", "PROT", "ALBUMIN" in the last 168 hours. No results for input(s): "LIPASE", "AMYLASE" in the last 168 hours. No results for input(s): "AMMONIA" in the last 168 hours.  ABG    Component Value Date/Time   PHART 7.47 (H) 03/08/2022 0522   PCO2ART 49 (H) 03/08/2022 0522   PO2ART 80 (L) 03/08/2022 0522   HCO3 35.7 (H) 03/08/2022 0522   TCO2 39 (H) 01/13/2018 0006   O2SAT 97.6 03/08/2022 0522     Coagulation Profile: No results for input(s): "INR", "PROTIME" in the last 168 hours.  Cardiac  Enzymes: No results for input(s): "CKTOTAL", "CKMB", "CKMBINDEX", "TROPONINI" in the last 168 hours.  HbA1C: Hemoglobin A1C  Date/Time Value Ref Range Status  02/13/2018 01:52 PM 7.5 (A) 4.0 - 5.6 % Final   Hgb A1c MFr Bld  Date/Time Value Ref Range Status  02/23/2022 10:05 PM 6.5 (H) 4.8 - 5.6 % Final    Comment:    (NOTE) Pre diabetes:          5.7%-6.4%  Diabetes:              >6.4%  Glycemic control for   <7.0% adults with diabetes   01/13/2018 09:53 AM 7.3 (H) 4.8 - 5.6 % Final    Comment:    (NOTE) Pre diabetes:  5.7%-6.4% Diabetes:              >6.4% Glycemic control for   <7.0% adults with diabetes     CBG: Recent Labs  Lab 03/08/22 1610 03/08/22 1924 03/08/22 2330 03/09/22 0430 03/09/22 0754  GLUCAP 262* 221* 192* 126* 197*     Review of Systems:   UTA-patient intubated and sedated  Allergies Allergies  Allergen Reactions   Other Anaphylaxis    McCormick's Rub (Food)   Beef-Derived Products Itching    Alpha gal allergy   Pork-Derived Products Hives    Alpha gal     Home Medications  Prior to Admission medications   Medication Sig Start Date End Date Taking? Authorizing Provider  albuterol (PROVENTIL HFA;VENTOLIN HFA) 108 (90 Base) MCG/ACT inhaler Inhale 2 puffs into the lungs every 6 (six) hours as needed for wheezing or shortness of breath. 02/03/18   Azzie Glatter, FNP  aspirin 81 MG tablet Take 81 mg by mouth daily.    [provider]  blood glucose meter kit and supplies Dispense based on patient and insurance preference. Use up to four times daily as directed. (FOR ICD-10 E10.9, E11.9). 01/15/18   Eugenie Filler, MD  buPROPion (WELLBUTRIN SR) 150 MG 12 hr tablet Take 150 mg by mouth 2 (two) times daily.    [provider]  diphenhydrAMINE (BENADRYL) 25 MG tablet Take 1 tablet (25 mg total) by mouth every 6 (six) hours. Patient taking differently: Take 25 mg by mouth daily.  09/07/17   Jola Schmidt, MD   fluticasone (FLONASE) 50 MCG/ACT nasal spray Place 2 sprays into both nostrils daily. 01/16/18   Eugenie Filler, MD  folic acid (FOLVITE) 1 MG tablet Take 1 tablet (1 mg total) by mouth daily. Patient not taking: Reported on 02/03/2018 01/16/18   Eugenie Filler, MD  furosemide (LASIX) 40 MG tablet Take 1 tablet (40 mg total) by mouth daily. 02/03/18   Azzie Glatter, FNP  meloxicam (MOBIC) 15 MG tablet Take 15 mg by mouth daily.    [provider]  phentermine 15 MG capsule Take 15 mg by mouth every morning.    [provider]  traMADol (ULTRAM) 50 MG tablet Take 1 tablet (50 mg total) by mouth every 8 (eight) hours as needed. 02/13/18   Azzie Glatter, FNP  umeclidinium bromide (INCRUSE ELLIPTA) 62.5 MCG/INH AEPB Inhale 1 puff into the lungs daily. Patient not taking: Reported on 07/28/2019 02/11/18   Azzie Glatter, FNP  Scheduled Meds:  budesonide (PULMICORT) nebulizer solution  0.25 mg Nebulization BID   Chlorhexidine Gluconate Cloth  6 each Topical Daily   diazepam  5 mg Per Tube Q6H   docusate  100 mg Per Tube BID   fondaparinux (ARIXTRA) injection  2.5 mg Subcutaneous Q24H   free water  200 mL Per Tube Q4H   insulin aspart  0-20 Units Subcutaneous Q4H   insulin aspart  5 Units Subcutaneous Q4H   insulin glargine-yfgn  15 Units Subcutaneous QHS   ipratropium-albuterol  3 mL Nebulization Q6H   methylPREDNISolone (SOLU-MEDROL) injection  40 mg Intravenous Daily   mouth rinse  15 mL Mouth Rinse Q2H   oxyCODONE  5 mg Per Tube Q6H   pantoprazole  40 mg Per Tube Daily   polyethylene glycol  17 g Per Tube Daily   sodium chloride flush  10-40 mL Intracatheter Q12H   Continuous Infusions:  sodium chloride Stopped (02/23/22 2350)   feeding supplement (PIVOT 1.5  CAL) 70 mL/hr at 03/09/22 0600   fentaNYL infusion INTRAVENOUS 100 mcg/hr (03/09/22 0600)   midazolam 3 mg/hr (03/09/22 0600)   PRN Meds:.acetaminophen, acetaminophen, bisacodyl, diazepam, docusate,  fentaNYL, fentaNYL (SUBLIMAZE) injection, ibuprofen, ipratropium-albuterol, midazolam, midazolam, ondansetron (ZOFRAN) IV, mouth rinse, oxyCODONE, sodium chloride flush, vecuronium   Critical care time: 40 minutes    The patient is critically ill with multiple organ systems failure and requires high complexity decision making for assessment and support, frequent evaluation and titration of therapies, application of advanced monitoring technologies and extensive interpretation of multiple databases.    Renold Don, MD Advanced Bronchoscopy PCCM [facility]    *This note was dictated using voice recognition software/Dragon.  Despite best efforts to proofread, errors can occur which can change the meaning. Any transcriptional errors that result from this process are unintentional and may not be fully corrected at the time of dictation.

## 2022-03-09 NOTE — Consult Note (Signed)
PHARMACY CONSULT NOTE  Pharmacy Consult for Electrolyte Monitoring and Replacement   Recent Labs: Potassium (mmol/L)  Date Value  03/09/2022 4.6  04/21/2012 4.8   Magnesium (mg/dL)  Date Value  83/41/9622 2.8 (H)   Calcium (mg/dL)  Date Value  29/79/8921 9.1   Calcium, Total (mg/dL)  Date Value  19/41/7408 8.8   Albumin (g/dL)  Date Value  14/48/1856 4.1  04/21/2012 4.0   Phosphorus (mg/dL)  Date Value  31/49/7026 5.5 (H)   Sodium (mmol/L)  Date Value  03/09/2022 147 (H)  04/21/2012 139   Assessment: Pharmacy has been consulted to monitor electrolytes in 60yo male presenting to the ED complaining of worsening bilateral leg swelling and pain. He was self-treating with mucinex and took a COVID test which was negative. He is currently intubated and sedated in CCU. Pharmacy is asked to follow and replace electrolytes while in CCU  Nutrition: Tube feeds @70  ml/h, FWF q4h   Goal of Therapy:  Electrolytes within normal limits   Plan:  Free water flushes increased to 200 mL every 4 hours d/t increasing sodium Follow-up electrolytes with AM labs tomorrow  03/09/2022 8:13 AM

## 2022-03-10 LAB — CBC
HCT: 59.1 % — ABNORMAL HIGH (ref 39.0–52.0)
Hemoglobin: 17.3 g/dL — ABNORMAL HIGH (ref 13.0–17.0)
MCH: 28.3 pg (ref 26.0–34.0)
MCHC: 29.3 g/dL — ABNORMAL LOW (ref 30.0–36.0)
MCV: 96.6 fL (ref 80.0–100.0)
Platelets: 222 10*3/uL (ref 150–400)
RBC: 6.12 MIL/uL — ABNORMAL HIGH (ref 4.22–5.81)
RDW: 18.2 % — ABNORMAL HIGH (ref 11.5–15.5)
WBC: 7.3 10*3/uL (ref 4.0–10.5)
nRBC: 0 % (ref 0.0–0.2)

## 2022-03-10 LAB — BASIC METABOLIC PANEL
Anion gap: 5 (ref 5–15)
BUN: 88 mg/dL — ABNORMAL HIGH (ref 6–20)
CO2: 33 mmol/L — ABNORMAL HIGH (ref 22–32)
Calcium: 9.9 mg/dL (ref 8.9–10.3)
Chloride: 114 mmol/L — ABNORMAL HIGH (ref 98–111)
Creatinine, Ser: 1.52 mg/dL — ABNORMAL HIGH (ref 0.61–1.24)
GFR, Estimated: 52 mL/min — ABNORMAL LOW (ref >60)
Glucose, Bld: 174 mg/dL — ABNORMAL HIGH (ref 70–99)
Potassium: 3.8 mmol/L (ref 3.5–5.1)
Sodium: 152 mmol/L — ABNORMAL HIGH (ref 135–145)

## 2022-03-10 LAB — GLUCOSE, CAPILLARY
Glucose-Capillary: 159 mg/dL — ABNORMAL HIGH (ref 70–99)
Glucose-Capillary: 160 mg/dL — ABNORMAL HIGH (ref 70–99)
Glucose-Capillary: 207 mg/dL — ABNORMAL HIGH (ref 70–99)
Glucose-Capillary: 212 mg/dL — ABNORMAL HIGH (ref 70–99)
Glucose-Capillary: 213 mg/dL — ABNORMAL HIGH (ref 70–99)
Glucose-Capillary: 286 mg/dL — ABNORMAL HIGH (ref 70–99)

## 2022-03-10 LAB — MAGNESIUM: Magnesium: 2.6 mg/dL — ABNORMAL HIGH (ref 1.7–2.4)

## 2022-03-10 LAB — PHOSPHORUS: Phosphorus: 4.5 mg/dL (ref 2.5–4.6)

## 2022-03-10 MED ORDER — DEXMEDETOMIDINE HCL IN NACL 400 MCG/100ML IV SOLN
0.4000 ug/kg/h | INTRAVENOUS | Status: DC
  Administered 2022-03-10: 0.8 ug/kg/h via INTRAVENOUS
  Administered 2022-03-10: 0.1 ug/kg/h via INTRAVENOUS
  Administered 2022-03-10 – 2022-03-11 (×4): 0.8 ug/kg/h via INTRAVENOUS
  Filled 2022-03-10 (×6): qty 100

## 2022-03-10 MED ORDER — DEXTROSE 5 % IV SOLN
INTRAVENOUS | Status: DC
Start: 2022-03-10 — End: 2022-03-11

## 2022-03-10 MED ORDER — FREE WATER
200.0000 mL | Status: DC
  Administered 2022-03-10 – 2022-03-11 (×15): 200 mL

## 2022-03-10 MED ORDER — INSULIN ASPART 100 UNIT/ML IJ SOLN
8.0000 [IU] | INTRAMUSCULAR | Status: DC
Start: 2022-03-10 — End: 2022-03-16
  Administered 2022-03-10 – 2022-03-14 (×15): 8 [IU] via SUBCUTANEOUS
  Filled 2022-03-10 (×14): qty 1

## 2022-03-10 MED ORDER — INSULIN GLARGINE-YFGN 100 UNIT/ML ~~LOC~~ SOLN
20.0000 [IU] | Freq: Every day | SUBCUTANEOUS | Status: DC
Start: 2022-03-10 — End: 2022-03-28
  Administered 2022-03-10 – 2022-03-27 (×17): 20 [IU] via SUBCUTANEOUS
  Filled 2022-03-10 (×18): qty 0.2

## 2022-03-10 NOTE — Progress Notes (Addendum)
Busy day. Moved arms and legs appropriately even though he did not follow commands. Foley placed per order and 2000 mls of urine retrieved. Family in to visit. Mitts removed. PEEP decreased to 5 per RT.

## 2022-03-10 NOTE — Progress Notes (Signed)
NAME:  Bryan Mitchell, MRN:  [id number], DOB:  1960-12-21, LOS: 10 ADMISSION DATE:  02/23/2022, INITIAL CONSULTATION DATE:  02/23/22 REFERRING MD:  Dr. Toy Cookey, CHIEF COMPLAINT:  SOB and fatigue   History of Present Illness:  61 yo M presenting to [facility] ED on 02/23/22 from home in a wheelchair with his sister for evaluation of worsening fatigue, poor appetite and SOB over the past 2 weeks. Per ED documentation, patient also complained of worsening bilateral leg swelling and pain.  Sister interviewed over the telephone providing supplemental history. She described that he lives alone, and began not feeling good for about 2 weeks with complaints of dyspnea and fatigue. He self-treated with Mucinex and took a COVID test which was negative. He started having increased congestion and poor PO intake. On the night of 8/18 the sister spoke with the patient on the phone and he told her, "I feel like I'm dying". Today on 8/19 she convinced the patient to go to the hospital to be evaluated. She reports he does not trust doctors and has difficulty following outpatient medication regimens. He was prescribed home O2, and has oxygen tanks at the house but has not used them in 2 years per his sister. He also injured his RIGHT knee a few weeks ago and had been getting around with a crutch. She reported he had been a little more confused on 8/19.  Per ED documentation, he endorsed a productive cough, but denied fever/chills. Patient admitted to not taking his Lasix when interviewed by [facility] service, per their documentation. And he has noticed increased pedal edema recently.  ED course: Upon arrival in triage the patient was profoundly hypoxic with his SpO2 was in the 60's on RA, this initially recovered to 99% on 3 L [place]. ABG revealed partially compensated hypercapnia and he was placed on BIPAP. He received Lasix for diuresis, BNP was mildly elevated. CT angio negative for pulmonary edema/effusions, negative for PE. [facility]  service consulted for admission. After a couple of hours on BIPAP, patient became combative and agitated, ripping off the BIPAP mask. Patient emergently intubated in ED. PCCM consulted. Medications given: IV contrast, Duo-nebs, lasix, fondaparinux, doxycycline, fentanyl, versed, ketamine, rocuronium & succinylcholine for intubation, solumedrol, propofol drip started Initial Vitals: 97.9, 20, 100, 117/68 & SpO2 62% on RA Significant labs: (Labs/ Imaging personally reviewed) I, Domingo Pulse Rust[place], AGACNP-BC, personally viewed and interpreted this ECG. EKG Interpretation: Date: 02/23/22, EKG Time: 16:27, Rate: 98, Rhythm: NSR, QRS Axis:  borderline RAD, Intervals: normal, ST/T Wave abnormalities: inferior T wave inversions, Narrative Interpretation: NSR with borderline RAD and probable LAE with inferior T wave inversions Chemistry: Na+: 139, K+: 3.8, BUN/Cr.: 31/0.79, Serum CO2/ AG: 32/8 Hematology: WBC: 6.9, Hgb: 18.9, Hct: 63.3, plt: 178 BNP: 283.7, PCT: 0.22, COVID-19 & Influenza A/B: negative ABG: 7.25/ 99/ 59/ 43.4 CXR 02/23/22: pulmonary hypoinflation. Central pulmonary vascular engorgement without overt pulmonary edema. Stable tubes CT 02/23/22: no evidence of pulmonary embolism. Mild cardiomegaly, CAD, main pulmonary trunk is dilated measuring 3.9 cm suggesting pulmonary arterial hypertension. Mild bibasilar atelectasis. No evidence of pulmonary edema or pleural effusion. CT head wo contrast 02/23/22: No acute intracranial abnormality.  PCCM consulted due to worsening acute on chronic hypoxic/hypercapnic respiratory failure requiring emergent intubation and mechanical ventilatory support.  Pertinent  Medical History  T2DM COPD Morbid obesity Tobacco abuse (cigars, 40 pack year history) Secondary Polycythemia HFpEF  Micro Data:  8/19: SARS-CoV-2 & Influenza PCR>>negative 8/19: RVP>> Rhinovirus/Enterovirus 8/19: MRSA PCR>> negative 8/20: Tracheal aspirate>>normal flora 8/28:  Tracheal aspirate >>normal flora 8/31: Repeat Tracheal aspirate>> rare gram + rods  Antimicrobials:  Ceftriaxone 8/19>>8/23 Doxycycline 8/19>>8/23  Significant Hospital Events: Including procedures, antibiotic start and stop dates in addition to other pertinent events   02/23/22: Admit to ICU with worsening acute on chronic hypoxic/hypercapnic respiratory failure s/t suspected COPD exacerbation in the setting of HFpEF and possible pulmonary hypertension requiring emergent intubation and mechanical ventilatory support. 02/24/22: Pt remains mechanically intubated settings decreased: PEEP 5/FiO2 40%.   02/25/22: Increased vent requirements this morning due to diffuse pulmonary vascular congestion, received lasix. Tracheal aspirate with gram + cocci & rods, gram - rods.  Severe Delirium upon WUA. 02/26/22: Remains on vent, 60% fio2 & 8 PEEP.  Diurese with Diamox + Metolazone. 02/27/22: No acute events overnight; remains mechanically intubated current vent settings: 40%/8 PEEP  02/28/22: Pts O2 requirements increased overnight due to hypoxia vent settings: FiO2 50%/PEEP 8 unable to perform SBT  03/01/22: Overnight with hypoxia, concern for ? Mucus plug.  Vent settings this am 100% FiO2 & 8 PEEP.  Plan for Diuresis, considering Bronchoscopy 03/02/22: Continued high vent settings (100%, 12 PEEP), start recruitment maneuvers, considering bronch.  Diurese. 03/03/22: Continued high vent settings (100% FiO2, 12 PEEP). Diuresing.  Obtain CT Angiogram Chest 03/03/22: CTA Chest revealed new bilateral lower lobe collapse or         consolidation since 02/23/2022. Very small superimposed          layering pleural effusions. No convincing acute pulmonary        edema. Suboptimal pulmonary artery contrast bolus timing. No          central pulmonary embolus. Satisfactory endotracheal and        enteric tubes. Aortic Atherosclerosis (ICD10-I70.0). 03/04/22: Pt currently proned and tolerating.  Remains on high ventilator  settings: FiO2 80%/PEEP 15/PC O2 sats 99% 03/05/22: Pt remains mechanically intubated vent settings currently FiO2 55%/PEEP 12.  Palliative Care working with family regarding goals of care  03/06/22: MRI Brain negative.  03/07/22: Remains intubated, vent settings 60% FiO2, 10 PEEP.  Consult ENT for Tracheostomy.  Low grade temperature (T max 99.8), reculture tracheal aspirate.  Diurese with Diamox + Metolazone 03/08/22: Low grade fever (T max 100.7), repeat TA with rare gram + rods, but PCT normal and no Leukocytosis.  Check Venous US to r/o DVT as potential cause of fever, along with UA.  Low threshold to start ABX.  Vent requirements decreased to 40% FiO2 & 10 PEEP. Creatinine remains stable, diurese with Diamox + Metolazone. 03/09/2022: Remains on the ventilator, FiO2 at 40%.  Continues to require 10 of PEEP.  Interim History / Subjective:  As outlined above under significant events.  No significant issues overnight.  Objective   Blood pressure (!) 152/93, pulse (!) 112, temperature 98.9 F (37.2 C), temperature source Oral, resp. rate 18, height 5' 8"  (1.727 m), weight 135.4 kg, SpO2 92 %.    Vent Mode: PCV FiO2 (%):  [40 %] 40 % Set Rate:  [16 bmp] 16 bmp PEEP:  [5 cmH20-8 cmH20] 5 cmH20 Plateau Pressure:  [16 cmH20] 16 cmH20   Intake/Output Summary (Last 24 hours) at 03/10/2022 0831 Last data filed at 03/10/2022 0600 Gross per 24 hour  Intake 1889.15 ml  Output 3450 ml  Net -1560.85 ml    Filed Weights   03/08/22 0425 03/09/22 0500 03/10/22 0346  Weight: (!) 138.9 kg (!) 140 kg 135.4 kg    Examination: General: Acutely on chronically-ill appearing male, intubated, synchronous with the  ventilator.  Opens eyes but does not track. HEENT: Supple, thick, unable to assess JVD Neuro: RASS: -3 (requiring deeper sedation due to high vent requirements), unable to follow commands, PERRL CV: Sinus tachycardia, rrr, no r/g, 2+ radial/2+ distal pulses, 1+ bilateral lower extremity edema  Pulm:  coarse throughout, even, non labored, synchronous with vent GI: +BS x4, obese, soft, non distended  GU: External male catheter in place draining yellow urine Skin: No rashes or lesions present Extremities: Normal bulk and tone  Resolved Hospital Problem list   Hypotension secondary to possible sepsis vs sedation related   Assessment & Plan:   Acute on Chronic Hypoxic / Hypercapnic Respiratory Failure suspect multifocal in the setting of suspected COPD exacerbation, decompensated HFpEF and rhinovirus Tobacco Abuse PMHx: COPD, morbid obesity, tobacco abuse, HFpEF   CTA Chest 08/27: new bilateral lower lobe collapse or consolidation; negative for central PE  - Full vent support, implement lung protective strategies - Plateau pressures less than 30 cm H20 - Wean FiO2 & PEEP as tolerated to maintain O2 sats 88 to 92% - Follow intermittent Chest X-ray & ABG as needed - Spontaneous Breathing Trials when respiratory parameters met and mental status permits - VAP Bundle - Recruitment maneuvers - Bronchodilators & Pulmicort nebs - Continue iv steroids (40 mg Solumedrol daily) - PRN proning to improve VQ matching and recruit bases  - Recruitment maneuvers - Completed course of ABX as above - Repeat tracheal aspirate results pending - Diuresis as BP and renal function permits  Acute on Chronic HFpEF exacerbation Mildly elevated Troponin, due to demand ischemia - RESOLVED Query Pulmonary HTN PMHx: HFpEF  Echocardiogram 02/24/22: LVEF 60-65%, Grade II DD, Pulm HTN - Continuous  telemetry monitoring - Maintain MAP >65 - Troponins have stabilized - No diuretics due to acute kidney injury  Acute kidney injury Suspect due to overzealous diuresis Hypernatremia Increase free water Hold diuretics Foley catheter monitor I&O Concern for potential urinary retention  Rhinovirus New Low Grade Fever 9/1 - Trend WBC and monitor fever curve  - Completed course of abx therapy due to suspected  bacterial pneumonia,tracheal aspirate with normal respiratory flora - Repeat tracheal aspirate "normal flora", procalcitonin remains negative and remains without leukocytosis ~ low threshold to start ABX - No foley catheter in place, will need foley due to increased BUN/Cr. ? obstruction -Will obtain Venous US to r/o potential DVT as cause of fever, obtain UA  Polycythemia suspected secondary to chronic hypoxia ~improving  Followed by oncology outpatient, last seen 10/2019. He was offered a sleep study which he declined. At that time, he was deemed not a candidate for continued follow up as Hgb had stabilized at 17.0 & HCT was 51%.  Hgb: 18.9, Hct: 63.3 - Daily CBC - Spoke with on call oncology NP on 08/20: no interventions required at this time due to CTA Chest negative for PE and pts noncompliance with outpatient oxygen has likely worsened polycythemia.  Once pts oxygenation improves this will likely improve polycythemia pt can follow-up with hematology in outpatient setting.  Official hematology consult canceled   Type 2 Diabetes Mellitus Hemoglobin A1C: 6.5 - Monitor CBG Q 4 hours - SSI resistant dosing + Tube feeding coverage + basal - Decrease/taper steroids - Target range while in ICU: 140-180 - Follow ICU hyper/hypo-glycemia protocol  Acute Metabolic Encephalopathy Sedation needs in the setting of mechanical ventilation  CT Head negative 8/19 Follow up MRI Brain negative 8/30 - Treatment of metabolic derangements as outlined above - Maintain a RASS  goal -1 - Continue versed and fentanyl gtts to maintain RASS goal; continue valium per tube  - Daily wake up assessment   Pt is critically ill.  Prognosis is guarded.  High risk for cardiac arrest and death.  Palliative Care is following for GOC. If family decides to proceed with aggressive care pt will need tracheostomy   Best Practice (right click and "Reselect all SmartList Selections" daily)  Diet/type: NPO w/ meds via tube;  tube feeds DVT prophylaxis: Arixtra GI prophylaxis: PPI Lines: Left IJ central line and is still needed Foley: Foley catheter placed due to urinary retention Code Status:  full code Last date of multidisciplinary goals of care discussion [03/08/22]  Labs   CBC: Recent Labs  Lab 03/05/22 0517 03/06/22 0506 03/07/22 0442 03/08/22 0412 03/09/22 0500 03/10/22 0422  WBC 6.3 7.0 6.4 6.3 6.4 7.3  NEUTROABS 4.5 4.8 4.3  --   --   --   HGB 17.2* 17.2* 17.0 17.3* 16.0 17.3*  HCT 56.5* 57.5* 56.1* 58.5* 54.9* 59.1*  MCV 91.7 92.6 93.0 94.2 96.3 96.6  PLT 245 245 217 222 204 222     Basic Metabolic Panel: Recent Labs  Lab 03/06/22 0506 03/07/22 0442 03/07/22 1835 03/08/22 0412 03/09/22 0500 03/10/22 0422  NA 140 144 142 145 147* 152*  K 3.0* 3.6 4.5 4.2 4.6 3.8  CL 100 104 104 108 113* 114*  CO2 32 34* 30 32 27 33*  GLUCOSE 142* 121* 263* 181* 163* 174*  BUN 39* 49* 69* 76* 90* 88*  CREATININE 0.62 0.82 1.11 1.07 1.49* 1.52*  CALCIUM 9.8 9.8 9.7 9.7 9.1 9.9  MG 1.8 2.1  --  2.4 2.8* 2.6*  PHOS 2.2* 3.5  --  4.3 5.5* 4.5    GFR: Estimated Creatinine Clearance: 70.5 mL/min (A) (by C-G formula based on SCr of 1.52 mg/dL (H)). Recent Labs  Lab 03/05/22 0517 03/06/22 0506 03/07/22 0442 03/08/22 0412 03/09/22 0500 03/10/22 0422  PROCALCITON 0.20  --  0.10 0.14  --   --   WBC 6.3   < > 6.4 6.3 6.4 7.3   < > = values in this interval not displayed.     Liver Function Tests: No results for input(s): "AST", "ALT", "ALKPHOS", "BILITOT", "PROT", "ALBUMIN" in the last 168 hours. No results for input(s): "LIPASE", "AMYLASE" in the last 168 hours. No results for input(s): "AMMONIA" in the last 168 hours.  ABG    Component Value Date/Time   PHART 7.47 (H) 03/08/2022 0522   PCO2ART 49 (H) 03/08/2022 0522   PO2ART 80 (L) 03/08/2022 0522   HCO3 35.7 (H) 03/08/2022 0522   TCO2 39 (H) 01/13/2018 0006   O2SAT 97.6 03/08/2022 0522     Coagulation Profile: No results for  input(s): "INR", "PROTIME" in the last 168 hours.  Cardiac Enzymes: No results for input(s): "CKTOTAL", "CKMB", "CKMBINDEX", "TROPONINI" in the last 168 hours.  HbA1C: Hemoglobin A1C  Date/Time Value Ref Range Status  02/13/2018 01:52 PM 7.5 (A) 4.0 - 5.6 % Final   Hgb A1c MFr Bld  Date/Time Value Ref Range Status  02/23/2022 10:05 PM 6.5 (H) 4.8 - 5.6 % Final    Comment:    (NOTE) Pre diabetes:          5.7%-6.4%  Diabetes:              >6.4%  Glycemic control for   <7.0% adults with diabetes   01/13/2018 09:53 AM 7.3 (H) 4.8 - 5.6 % Final  Comment:    (NOTE) Pre diabetes:          5.7%-6.4% Diabetes:              >6.4% Glycemic control for   <7.0% adults with diabetes     CBG: Recent Labs  Lab 03/09/22 1719 03/09/22 1952 03/09/22 2347 03/10/22 0311 03/10/22 0751  GLUCAP 262* 218* 197* 159* 160*     Review of Systems:   UTA-patient intubated and sedated  Allergies Allergies  Allergen Reactions   Other Anaphylaxis    McCormick's Rub (Food)   Beef-Derived Products Itching    Alpha gal allergy   Pork-Derived Products Hives    Alpha gal     Home Medications  Prior to Admission medications   Medication Sig Start Date End Date Taking? Authorizing Provider  albuterol (PROVENTIL HFA;VENTOLIN HFA) 108 (90 Base) MCG/ACT inhaler Inhale 2 puffs into the lungs every 6 (six) hours as needed for wheezing or shortness of breath. 02/03/18   Azzie Glatter, FNP  aspirin 81 MG tablet Take 81 mg by mouth daily.    [provider]  blood glucose meter kit and supplies Dispense based on patient and insurance preference. Use up to four times daily as directed. (FOR ICD-10 E10.9, E11.9). 01/15/18   Eugenie Filler, MD  buPROPion (WELLBUTRIN SR) 150 MG 12 hr tablet Take 150 mg by mouth 2 (two) times daily.    [provider]  diphenhydrAMINE (BENADRYL) 25 MG tablet Take 1 tablet (25 mg total) by mouth every 6 (six) hours. Patient taking differently:  Take 25 mg by mouth daily.  09/07/17   Jola Schmidt, MD  fluticasone (FLONASE) 50 MCG/ACT nasal spray Place 2 sprays into both nostrils daily. 01/16/18   Eugenie Filler, MD  folic acid (FOLVITE) 1 MG tablet Take 1 tablet (1 mg total) by mouth daily. Patient not taking: Reported on 02/03/2018 01/16/18   Eugenie Filler, MD  furosemide (LASIX) 40 MG tablet Take 1 tablet (40 mg total) by mouth daily. 02/03/18   Azzie Glatter, FNP  meloxicam (MOBIC) 15 MG tablet Take 15 mg by mouth daily.    [provider]  phentermine 15 MG capsule Take 15 mg by mouth every morning.    [provider]  traMADol (ULTRAM) 50 MG tablet Take 1 tablet (50 mg total) by mouth every 8 (eight) hours as needed. 02/13/18   Azzie Glatter, FNP  umeclidinium bromide (INCRUSE ELLIPTA) 62.5 MCG/INH AEPB Inhale 1 puff into the lungs daily. Patient not taking: Reported on 07/28/2019 02/11/18   Azzie Glatter, FNP   Scheduled Meds:  budesonide (PULMICORT) nebulizer solution  0.25 mg Nebulization BID   Chlorhexidine Gluconate Cloth  6 each Topical Daily   diazepam  5 mg Per Tube Q6H   docusate  100 mg Per Tube BID   fondaparinux (ARIXTRA) injection  2.5 mg Subcutaneous Q24H   free water  200 mL Per Tube Q2H   insulin aspart  0-20 Units Subcutaneous Q4H   insulin aspart  5 Units Subcutaneous Q4H   insulin glargine-yfgn  15 Units Subcutaneous QHS   ipratropium-albuterol  3 mL Nebulization Q6H   methylPREDNISolone (SOLU-MEDROL) injection  40 mg Intravenous Daily   mouth rinse  15 mL Mouth Rinse Q2H   oxyCODONE  5 mg Per Tube Q6H   pantoprazole  40 mg Per Tube Daily   polyethylene glycol  17 g Per Tube Daily   sodium chloride flush  10-40 mL Intracatheter  Q12H   Continuous Infusions:  sodium chloride Stopped (02/23/22 2350)   feeding supplement (PIVOT 1.5 CAL) 70 mL/hr at 03/10/22 0600   fentaNYL infusion INTRAVENOUS 100 mcg/hr (03/10/22 0600)   midazolam 3 mg/hr (03/10/22 0600)   PRN  Meds:.acetaminophen, acetaminophen, bisacodyl, diazepam, docusate, fentaNYL, fentaNYL (SUBLIMAZE) injection, ibuprofen, ipratropium-albuterol, midazolam, midazolam, ondansetron (ZOFRAN) IV, mouth rinse, oxyCODONE, sodium chloride flush, vecuronium   Critical care time: 40 minutes    The patient is critically ill with multiple organ systems failure and requires high complexity decision making for assessment and support, frequent evaluation and titration of therapies, application of advanced monitoring technologies and extensive interpretation of multiple databases.    Renold Don, MD Advanced Bronchoscopy PCCM [facility]-    *This note was dictated using voice recognition software/Dragon.  Despite best efforts to proofread, errors can occur which can change the meaning. Any transcriptional errors that result from this process are unintentional and may not be fully corrected at the time of dictation.

## 2022-03-10 NOTE — Consult Note (Signed)
PHARMACY CONSULT NOTE  Pharmacy Consult for Electrolyte Monitoring and Replacement   Recent Labs: Potassium (mmol/L)  Date Value  03/10/2022 3.8  04/21/2012 4.8   Magnesium (mg/dL)  Date Value  37/85/8850 2.6 (H)   Calcium (mg/dL)  Date Value  27/74/1287 9.9   Calcium, Total (mg/dL)  Date Value  86/76/7209 8.8   Albumin (g/dL)  Date Value  47/03/6282 4.1  04/21/2012 4.0   Phosphorus (mg/dL)  Date Value  66/29/4765 4.5   Sodium (mmol/L)  Date Value  03/10/2022 152 (H)  04/21/2012 139   Assessment: Pharmacy has been consulted to monitor electrolytes in 61yo male presenting to the ED complaining of worsening bilateral leg swelling and pain. He was self-treating with mucinex and took a COVID test which was negative. He is currently intubated and sedated in CCU. Pharmacy is asked to follow and replace electrolytes while in CCU  Nutrition: Tube feeds @70  ml/h, FWF q4h   Goal of Therapy:  Electrolytes within normal limits   Plan:  Free water flushes continued at 200 mL every 4 hours d/t increasing sodium Starting dextrose 5 % at 50 mL/hr (d/w NP) Follow-up electrolytes with AM labs tomorrow  03/10/2022 9:39 AM

## 2022-03-11 LAB — CBC
HCT: 58.5 % — ABNORMAL HIGH (ref 39.0–52.0)
Hemoglobin: 17 g/dL (ref 13.0–17.0)
MCH: 28.4 pg (ref 26.0–34.0)
MCHC: 29.1 g/dL — ABNORMAL LOW (ref 30.0–36.0)
MCV: 97.7 fL (ref 80.0–100.0)
Platelets: 200 10*3/uL (ref 150–400)
RBC: 5.99 MIL/uL — ABNORMAL HIGH (ref 4.22–5.81)
RDW: 17.7 % — ABNORMAL HIGH (ref 11.5–15.5)
WBC: 7.2 10*3/uL (ref 4.0–10.5)
nRBC: 0 % (ref 0.0–0.2)

## 2022-03-11 LAB — URINALYSIS, ROUTINE W REFLEX MICROSCOPIC
Bilirubin Urine: NEGATIVE
Glucose, UA: NEGATIVE mg/dL
Ketones, ur: NEGATIVE mg/dL
Leukocytes,Ua: NEGATIVE
Nitrite: NEGATIVE
Protein, ur: NEGATIVE mg/dL
RBC / HPF: >50 RBC/hpf — ABNORMAL HIGH (ref 0–5)
Specific Gravity, Urine: 1.014 (ref 1.005–1.030)
pH: 5 (ref 5.0–8.0)

## 2022-03-11 LAB — GLUCOSE, CAPILLARY
Glucose-Capillary: 100 mg/dL — ABNORMAL HIGH (ref 70–99)
Glucose-Capillary: 150 mg/dL — ABNORMAL HIGH (ref 70–99)
Glucose-Capillary: 189 mg/dL — ABNORMAL HIGH (ref 70–99)
Glucose-Capillary: 190 mg/dL — ABNORMAL HIGH (ref 70–99)
Glucose-Capillary: 196 mg/dL — ABNORMAL HIGH (ref 70–99)
Glucose-Capillary: 236 mg/dL — ABNORMAL HIGH (ref 70–99)

## 2022-03-11 LAB — MAGNESIUM: Magnesium: 2.1 mg/dL (ref 1.7–2.4)

## 2022-03-11 LAB — BASIC METABOLIC PANEL
Anion gap: 9 (ref 5–15)
BUN: 85 mg/dL — ABNORMAL HIGH (ref 6–20)
CO2: 33 mmol/L — ABNORMAL HIGH (ref 22–32)
Calcium: 10 mg/dL (ref 8.9–10.3)
Chloride: 110 mmol/L (ref 98–111)
Creatinine, Ser: 1.1 mg/dL (ref 0.61–1.24)
GFR, Estimated: >60 mL/min (ref >60)
Glucose, Bld: 207 mg/dL — ABNORMAL HIGH (ref 70–99)
Potassium: 3.6 mmol/L (ref 3.5–5.1)
Sodium: 152 mmol/L — ABNORMAL HIGH (ref 135–145)

## 2022-03-11 LAB — BRAIN NATRIURETIC PEPTIDE: B Natriuretic Peptide: 123.4 pg/mL — ABNORMAL HIGH (ref 0.0–100.0)

## 2022-03-11 LAB — PHOSPHORUS: Phosphorus: 3.9 mg/dL (ref 2.5–4.6)

## 2022-03-11 LAB — OSMOLALITY, URINE: Osmolality, Ur: 559 mOsm/kg (ref 300–900)

## 2022-03-11 LAB — CREATININE, URINE, RANDOM: Creatinine, Urine: 38 mg/dL

## 2022-03-11 LAB — SODIUM, URINE, RANDOM: Sodium, Ur: 80 mmol/L

## 2022-03-11 LAB — OSMOLALITY: Osmolality: 348 mOsm/kg (ref 275–295)

## 2022-03-11 MED ORDER — METOPROLOL TARTRATE 5 MG/5ML IV SOLN
5.0000 mg | Freq: Three times a day (TID) | INTRAVENOUS | Status: DC
  Administered 2022-03-11 – 2022-03-16 (×15): 5 mg via INTRAVENOUS
  Filled 2022-03-11 (×15): qty 5

## 2022-03-11 MED ORDER — DEXTROSE 5 % IV SOLN
INTRAVENOUS | Status: DC
  Administered 2022-03-12: 75 mL/h via INTRAVENOUS

## 2022-03-11 MED ORDER — ORAL CARE MOUTH RINSE: 15.0000 mL | OROMUCOSAL | Status: DC | PRN

## 2022-03-11 MED ORDER — ORAL CARE MOUTH RINSE
15.0000 mL | OROMUCOSAL | Status: DC
  Administered 2022-03-11 – 2022-03-27 (×50): 15 mL via OROMUCOSAL

## 2022-03-11 MED ORDER — OFLOXACIN 0.3 % OP SOLN
2.0000 [drp] | Freq: Four times a day (QID) | OPHTHALMIC | Status: DC
  Administered 2022-03-11 – 2022-03-17 (×24): 2 [drp] via OPHTHALMIC
  Filled 2022-03-11: qty 5

## 2022-03-11 NOTE — Progress Notes (Addendum)
1430 Extubated to BiPAP at 50%. Patient was trialed on 4 L nasal canula before BiPAP started. Sister and cousin in room. Patient has had no sedation since 0800. Responds to family. SPO2 95%,heart rate 126 and respirations 24. Attempted to use nasal canula but patient unable to tolerate. Placed on BiPAP per orders.1800 Tolerated BiPAP all afternoon. Oxygen saturations remained above 88%. Pooped x 4  today as well.

## 2022-03-11 NOTE — Progress Notes (Addendum)
Daily Progress Note   Patient Name: Bryan Mitchell       Date: 03/11/2022 DOB: 07/21/1960  Age: 61 y.o. MRN#: [id number] Attending Physician: Salena Saner, MD Primary Care Physician: Pcp, No Admit Date: 02/23/2022  Reason for Consultation/Follow-up: Establishing goals of care  Subjective: Notes and labs reviewed. CCM and RN at bedside and concluding conversation with sister Lupita Leash who is at bedside. In to see patient and SBT is in progress. Patient is restless with mittens in place. She discusses how having the ET tube is not good for him and that he has a lot of secretions. Discussed the purpose of the tube and his need to breathe, protect his airway, and move secretions. She feels certain he will be okay, and does not discuss what if's or other scenarios with redirecting conversations. Per nursing and sister, he is able to answer questions. Attempted to ask him yes or no questions to determine cognition, and then hopefully determine his wishes, and sister works with him to get him to provide the correct answers, and he provides no responses. Hopeful patient will be able to discuss wishes on re-intubation and tracheostomy if the interventions are needed in the future.    Length of Stay: 16  Current Medications: Scheduled Meds:   budesonide (PULMICORT) nebulizer solution  0.25 mg Nebulization BID   Chlorhexidine Gluconate Cloth  6 each Topical Daily   diazepam  5 mg Per Tube Q6H   docusate  100 mg Per Tube BID   fondaparinux (ARIXTRA) injection  2.5 mg Subcutaneous Q24H   free water  200 mL Per Tube Q2H   insulin aspart  0-20 Units Subcutaneous Q4H   insulin aspart  8 Units Subcutaneous Q4H   insulin glargine-yfgn  20 Units Subcutaneous QHS   ipratropium-albuterol  3 mL Nebulization Q6H    ofloxacin  2 drop Both Eyes QID   mouth rinse  15 mL Mouth Rinse Q2H   oxyCODONE  5 mg Per Tube Q6H   pantoprazole  40 mg Per Tube Daily   polyethylene glycol  17 g Per Tube Daily   sodium chloride flush  10-40 mL Intracatheter Q12H    Continuous Infusions:  sodium chloride Stopped (02/23/22 2350)   dexmedetomidine (PRECEDEX) IV infusion 0.4 mcg/kg/hr (03/11/22 0754)   dextrose 50 mL/hr at 03/11/22 0600  feeding supplement (PIVOT 1.5 CAL) 1,000 mL (03/11/22 0606)   fentaNYL infusion INTRAVENOUS Stopped (03/11/22 0753)   midazolam Stopped (03/10/22 1119)    PRN Meds: acetaminophen, acetaminophen, bisacodyl, diazepam, docusate, fentaNYL, fentaNYL (SUBLIMAZE) injection, ipratropium-albuterol, ondansetron (ZOFRAN) IV, mouth rinse, oxyCODONE, sodium chloride flush  Physical Exam Constitutional:      Comments: Opens eyes intermittently.   Pulmonary:     Comments: On ventilator.             Vital Signs: BP 116/75   Pulse (!) 107   Temp 99.2 F (37.3 C)   Resp 18   Ht 5\' 8"  (1.727 m)   Wt 134.7 kg   SpO2 (!) 89%   BMI 45.15 kg/m  SpO2: SpO2: (!) 89 % O2 Device: O2 Device: Ventilator O2 Flow Rate: O2 Flow Rate (L/min): 6 L/min  Intake/output summary:  Intake/Output Summary (Last 24 hours) at 03/11/2022 1122 Last data filed at 03/11/2022 1022 Gross per 24 hour  Intake 4650.03 ml  Output 8250 ml  Net -3599.97 ml   LBM: Last BM Date : 03/10/22 Baseline Weight: Weight: (!) 145.2 kg Most recent weight: Weight: 134.7 kg    Patient Active Problem List   Diagnosis Date Noted   Pressure injury of skin 03/06/2022   Acute hypoxemic respiratory failure (HCC) 02/23/2022   Acute respiratory failure (HCC) 02/23/2022   COPD exacerbation (HCC)    Type 2 diabetes mellitus with complication, without long-term current use of insulin (HCC)    Edema of lower extremity    Acute respiratory failure with hypoxia (HCC) 01/13/2018   Morbid obesity (HCC) 01/13/2018   Hypoxia 12/16/2016    Cellulitis and abscess of trunk 12/16/2016   Tobacco abuse 12/16/2016   Allergic reaction 12/16/2016   Polycythemia 12/16/2016    Palliative Care Assessment & Plan    Recommendations/Plan: Continue full code/full scope. Trach planned for Thursday  Code Status:    Code Status Orders  (From admission, onward)           Start     Ordered   02/23/22 2142  Full code  Continuous        02/23/22 2141           Code Status History     Date Active Date Inactive Code Status Order ID Comments User Context   02/23/2022 1902 02/23/2022 2141 Full Code 2142  [id number], MD ED   01/13/2018 0354 01/15/2018 1908 Full Code 03/18/2018  [id number], MD Inpatient   12/16/2016 0533 12/16/2016 1853 Full Code 02/15/2017  [id number], MD ED       Thank you for allowing the Palliative Medicine Team to assist in the care of this patient.    Clydie Braun, NP  Please contact Palliative Medicine Team phone at [phone] for questions and concerns.

## 2022-03-11 NOTE — Consult Note (Signed)
PHARMACY CONSULT NOTE  Pharmacy Consult for Electrolyte Monitoring and Replacement   Recent Labs: Potassium (mmol/L)  Date Value  03/11/2022 3.6  04/21/2012 4.8   Magnesium (mg/dL)  Date Value  40/97/3532 2.1   Calcium (mg/dL)  Date Value  99/24/2683 10.0   Calcium, Total (mg/dL)  Date Value  41/96/2229 8.8   Albumin (g/dL)  Date Value  79/89/2119 4.1  04/21/2012 4.0   Phosphorus (mg/dL)  Date Value  41/74/0814 3.9   Sodium (mmol/L)  Date Value  03/11/2022 152 (H)  04/21/2012 139   Assessment: Pharmacy has been consulted to monitor electrolytes in 60yo male presenting to the ED complaining of worsening bilateral leg swelling and pain. He was self-treating with mucinex and took a COVID test which was negative. He is currently intubated and sedated in CCU. Pharmacy is asked to follow and replace electrolytes while in CCU  Nutrition: Tube feeds @70  ml/h, FWF q4h   Goal of Therapy:  Electrolytes within normal limits   Plan:  Free water flushes continued at 200 mL every 2 hours d/t increasing sodium Continue dextrose 5 % at 50 mL/hr (d/w NP) Follow-up electrolytes with AM labs tomorrow  03/11/2022 8:29 AM

## 2022-03-11 NOTE — Progress Notes (Signed)
NAME:  Bryan Mitchell, MRN:  [id number], DOB:  1961/01/23, LOS: 71 ADMISSION DATE:  02/23/2022, INITIAL CONSULTATION DATE:  02/23/22 REFERRING MD:  Dr. Toy Cookey, CHIEF COMPLAINT:  SOB and fatigue   History of Present Illness:  61 yo M presenting to [facility] ED on 02/23/22 from home in a wheelchair with his sister for evaluation of worsening fatigue, poor appetite and SOB over the past 2 weeks. Per ED documentation, patient also complained of worsening bilateral leg swelling and pain.  Sister interviewed over the telephone providing supplemental history. She described that he lives alone, and began not feeling good for about 2 weeks with complaints of dyspnea and fatigue. He self-treated with Mucinex and took a COVID test which was negative. He started having increased congestion and poor PO intake. On the night of 8/18 the sister spoke with the patient on the phone and he told her, "I feel like I'm dying". Today on 8/19 she convinced the patient to go to the hospital to be evaluated. She reports he does not trust doctors and has difficulty following outpatient medication regimens. He was prescribed home O2, and has oxygen tanks at the house but has not used them in 2 years per his sister. He also injured his RIGHT knee a few weeks ago and had been getting around with a crutch. She reported he had been a little more confused on 8/19.  Per ED documentation, he endorsed a productive cough, but denied fever/chills. Patient admitted to not taking his Lasix when interviewed by [facility] service, per their documentation. And he has noticed increased pedal edema recently.  ED course: Upon arrival in triage the patient was profoundly hypoxic with his SpO2 was in the 60's on RA, this initially recovered to 99% on 3 L [place]. ABG revealed partially compensated hypercapnia and he was placed on BIPAP. He received Lasix for diuresis, BNP was mildly elevated. CT angio negative for pulmonary edema/effusions, negative for PE. [facility]  service consulted for admission. After a couple of hours on BIPAP, patient became combative and agitated, ripping off the BIPAP mask. Patient emergently intubated in ED. PCCM consulted. Medications given: IV contrast, Duo-nebs, lasix, fondaparinux, doxycycline, fentanyl, versed, ketamine, rocuronium & succinylcholine for intubation, solumedrol, propofol drip started Initial Vitals: 97.9, 20, 100, 117/68 & SpO2 62% on RA Significant labs: (Labs/ Imaging personally reviewed) I, Domingo Pulse Rust[place], AGACNP-BC, personally viewed and interpreted this ECG. EKG Interpretation: Date: 02/23/22, EKG Time: 16:27, Rate: 98, Rhythm: NSR, QRS Axis:  borderline RAD, Intervals: normal, ST/T Wave abnormalities: inferior T wave inversions, Narrative Interpretation: NSR with borderline RAD and probable LAE with inferior T wave inversions Chemistry: Na+: 139, K+: 3.8, BUN/Cr.: 31/0.79, Serum CO2/ AG: 32/8 Hematology: WBC: 6.9, Hgb: 18.9, Hct: 63.3, plt: 178 BNP: 283.7, PCT: 0.22, COVID-19 & Influenza A/B: negative ABG: 7.25/ 99/ 59/ 43.4 CXR 02/23/22: pulmonary hypoinflation. Central pulmonary vascular engorgement without overt pulmonary edema. Stable tubes CT 02/23/22: no evidence of pulmonary embolism. Mild cardiomegaly, CAD, main pulmonary trunk is dilated measuring 3.9 cm suggesting pulmonary arterial hypertension. Mild bibasilar atelectasis. No evidence of pulmonary edema or pleural effusion. CT head wo contrast 02/23/22: No acute intracranial abnormality.  PCCM consulted due to worsening acute on chronic hypoxic/hypercapnic respiratory failure requiring emergent intubation and mechanical ventilatory support.  Pertinent  Medical History  T2DM COPD Morbid obesity Tobacco abuse (cigars, 40 pack year history) Secondary Polycythemia HFpEF  Micro Data:  8/19: SARS-CoV-2 & Influenza PCR>>negative 8/19: RVP>> Rhinovirus/Enterovirus 8/19: MRSA PCR>> negative 8/20: Tracheal aspirate>>normal flora 8/28:  Tracheal aspirate >>normal flora 8/31: Repeat Tracheal aspirate>> rare gram + rods  Antimicrobials:  Ceftriaxone 8/19>>8/23 Doxycycline 8/19>>8/23  Significant Hospital Events: Including procedures, antibiotic start and stop dates in addition to other pertinent events   02/23/22: Admit to ICU with worsening acute on chronic hypoxic/hypercapnic respiratory failure s/t suspected COPD exacerbation in the setting of HFpEF and possible pulmonary hypertension requiring emergent intubation and mechanical ventilatory support. 02/24/22: Pt remains mechanically intubated settings decreased: PEEP 5/FiO2 40%.   02/25/22: Increased vent requirements this morning due to diffuse pulmonary vascular congestion, received lasix. Tracheal aspirate with gram + cocci & rods, gram - rods.  Severe Delirium upon WUA. 02/26/22: Remains on vent, 60% fio2 & 8 PEEP.  Diurese with Diamox + Metolazone. 02/27/22: No acute events overnight; remains mechanically intubated current vent settings: 40%/8 PEEP  02/28/22: Pts O2 requirements increased overnight due to hypoxia vent settings: FiO2 50%/PEEP 8 unable to perform SBT  03/01/22: Overnight with hypoxia, concern for ? Mucus plug.  Vent settings this am 100% FiO2 & 8 PEEP.  Plan for Diuresis, considering Bronchoscopy 03/02/22: Continued high vent settings (100%, 12 PEEP), start recruitment maneuvers, considering bronch.  Diurese. 03/03/22: Continued high vent settings (100% FiO2, 12 PEEP). Diuresing.  Obtain CT Angiogram Chest 03/03/22: CTA Chest revealed new bilateral lower lobe collapse or         consolidation since 02/23/2022. Very small superimposed          layering pleural effusions. No convincing acute pulmonary        edema. Suboptimal pulmonary artery contrast bolus timing. No          central pulmonary embolus. Satisfactory endotracheal and        enteric tubes. Aortic Atherosclerosis (ICD10-I70.0). 03/04/22: Pt currently proned and tolerating.  Remains on high ventilator  settings: FiO2 80%/PEEP 15/PC O2 sats 99% 03/05/22: Pt remains mechanically intubated vent settings currently FiO2 55%/PEEP 12.  Palliative Care working with family regarding goals of care  03/06/22: MRI Brain negative.  03/07/22: Remains intubated, vent settings 60% FiO2, 10 PEEP.  Consult ENT for Tracheostomy.  Low grade temperature (T max 99.8), reculture tracheal aspirate.  Diurese with Diamox + Metolazone 03/08/22: Low grade fever (T max 100.7), repeat TA with rare gram + rods, but PCT normal and no Leukocytosis.  Check Venous US to r/o DVT as potential cause of fever, along with UA.  Low threshold to start ABX.  Vent requirements decreased to 40% FiO2 & 10 PEEP. Creatinine remains stable, diurese with Diamox + Metolazone. 03/09/2022: Remains on the ventilator, FiO2 at 40%.  Continues to require 10 of PEEP. Discontinued diuretics. 03/10/2022: Foley placed due to urinary retention 9/4  Interim History / Subjective:  As outlined above under significant events.  No significant issues overnight.  Objective   Blood pressure 137/86, pulse 81, temperature 98.5 F (36.9 C), temperature source Oral, resp. rate (!) 23, height '5\' 8"'  (1.727 m), weight 134.7 kg, SpO2 (!) 88 %.    Vent Mode: PSV FiO2 (%):  [35 %-40 %] 35 % Set Rate:  [16 bmp] 16 bmp PEEP:  [5 cmH20] 5 cmH20 Pressure Support:  [12 cmH20] 12 cmH20 Plateau Pressure:  [15 cmH20] 15 cmH20   Intake/Output Summary (Last 24 hours) at 03/11/2022 0854 Last data filed at 03/11/2022 0800 Gross per 24 hour  Intake 4640.03 ml  Output 8250 ml  Net -3609.97 ml    Filed Weights   03/09/22 0500 03/10/22 0346 03/11/22 0500  Weight: (!) 140 kg 135.4 kg  134.7 kg    Examination: General: Acutely on chronically-ill appearing male, intubated, synchronous with the ventilator.  On SAT: opens eyes, tracks, follows commands HEENT: Supple, thick, unable to assess JVD Neuro: RASS: -1/0, follows commands PERRL CV: Sinus tachycardia, rrr, no grade 2/6 systolic  ejection murmur, 2+ radial/2+ distal pulses, 1+ bilateral lower extremity edema  Pulm: coarse throughout, even, non labored, synchronous with vent GI: +BS x4, obese, soft, non distended  GU: External male catheter in place draining yellow urine Skin: No rashes or lesions present Extremities: Normal bulk and tone  Resolved Hospital Problem list   Hypotension secondary to possible sepsis vs sedation related   Assessment & Plan:   Acute on Chronic Hypoxic / Hypercapnic Respiratory Failure suspect multifocal in the setting of suspected COPD exacerbation, decompensated HFpEF and rhinovirus Tobacco Abuse PMHx: COPD, morbid obesity, tobacco abuse, HFpEF   CTA Chest 08/27: new bilateral lower lobe collapse or consolidation; negative for central PE  - Wean FiO2 & PEEP as tolerated to maintain O2 sats 88 to 92% - Follow intermittent Chest X-ray & ABG as needed - Spontaneous Breathing Trials - tolerating today - VAP Bundle - Bronchodilators & Pulmicort nebs - IV steroids discontinued, course completed - Completed course of ABx - Repeat tracheal aspirate results: noncontributory - Diuresis as BP and renal function permits  Acute on Chronic HFpEF exacerbation Mildly elevated Troponin, due to demand ischemia - RESOLVED Pulmonary HTN PMHx: HFpEF  Echocardiogram 02/24/22: LVEF 60-65%, Grade II DD, Pulm HTN - Continuous  telemetry monitoring - Maintain MAP >65 - Troponins have stabilized - No diuretics due to acute kidney injury  Acute kidney injury Suspect due to overzealous diuresis Urinary retention Hypernatremia Increase free water Hold diuretics Foley catheter monitor I&O  Rhinovirus New Low Grade Fever 9/1 - Trend WBC and monitor fever curve  - Completed course of abx therapy due to suspected bacterial pneumonia,tracheal aspirate with normal respiratory flora - Repeat tracheal aspirate noncontrib, procalcitonin remains negative and remains without leukocytosis ~ low threshold to  start ABX - No foley catheter in place, will need foley due to increased BUN/Cr. ? obstruction -Will obtain Venous US to r/o potential DVT as cause of fever, obtain UA  Polycythemia suspected secondary to chronic hypoxia ~improving  Followed by oncology outpatient, last seen 10/2019. He was offered a sleep study which he declined. At that time, he was deemed not a candidate for continued follow up as Hgb had stabilized at 17.0 & HCT was 51%.  Hgb: 18.9, Hct: 63.3 - Daily CBC    Type 2 Diabetes Mellitus Hemoglobin A1C: 6.5 - Monitor CBG Q 4 hours - SSI resistant dosing + Tube feeding coverage + basal - Steroids D/C d - Target range while in ICU: 140-180 - Follow ICU hyper/hypo-glycemia protocol  Acute Metabolic Encephalopathy Sedation needs in the setting of mechanical ventilation  CT Head negative 8/19 Follow up MRI Brain negative 8/30 - Treatment of metabolic derangements as outlined above - Maintain a RASS goal -1 - Wean versed and fentanyl gtts - trial of extub - SAT: follows commands  Hypernatremia - Increase free water - Urine studies given increased UO ( ? Post obstructive diuresis vs DI)   Pt is critically ill.  Prognosis is guarded.  High risk for cardiac arrest and death.  Palliative Care is following for GOC. If family decides to proceed with aggressive care pt will need tracheostomy .  Best Practice (right click and "Reselect all SmartList Selections" daily)  Diet/type: NPO w/ meds  via tube; tube feeds DVT prophylaxis: Arixtra GI prophylaxis: PPI Lines: Left IJ central line and is still needed Foley: Foley catheter placed due to urinary retention Code Status:  full code Last date of multidisciplinary goals of care discussion [03/08/22]  Labs   CBC: Recent Labs  Lab 03/05/22 0517 03/06/22 0506 03/07/22 0442 03/08/22 0412 03/09/22 0500 03/10/22 0422 03/11/22 0542  WBC 6.3 7.0 6.4 6.3 6.4 7.3 7.2  NEUTROABS 4.5 4.8 4.3  --   --   --   --   HGB 17.2* 17.2*  17.0 17.3* 16.0 17.3* 17.0  HCT 56.5* 57.5* 56.1* 58.5* 54.9* 59.1* 58.5*  MCV 91.7 92.6 93.0 94.2 96.3 96.6 97.7  PLT 245 245 217 222 204 222 200     Basic Metabolic Panel: Recent Labs  Lab 03/07/22 0442 03/07/22 1835 03/08/22 0412 03/09/22 0500 03/10/22 0422 03/11/22 0542  NA 144 142 145 147* 152* 152*  K 3.6 4.5 4.2 4.6 3.8 3.6  CL 104 104 108 113* 114* 110  CO2 34* 30 32 27 33* 33*  GLUCOSE 121* 263* 181* 163* 174* 207*  BUN 49* 69* 76* 90* 88* 85*  CREATININE 0.82 1.11 1.07 1.49* 1.52* 1.10  CALCIUM 9.8 9.7 9.7 9.1 9.9 10.0  MG 2.1  --  2.4 2.8* 2.6* 2.1  PHOS 3.5  --  4.3 5.5* 4.5 3.9    GFR: Estimated Creatinine Clearance: 97.1 mL/min (by C-G formula based on SCr of 1.1 mg/dL). Recent Labs  Lab 03/05/22 0517 03/06/22 0506 03/07/22 0442 03/08/22 0412 03/09/22 0500 03/10/22 0422 03/11/22 0542  PROCALCITON 0.20  --  0.10 0.14  --   --   --   WBC 6.3   < > 6.4 6.3 6.4 7.3 7.2   < > = values in this interval not displayed.     Liver Function Tests: No results for input(s): "AST", "ALT", "ALKPHOS", "BILITOT", "PROT", "ALBUMIN" in the last 168 hours. No results for input(s): "LIPASE", "AMYLASE" in the last 168 hours. No results for input(s): "AMMONIA" in the last 168 hours.  ABG    Component Value Date/Time   PHART 7.47 (H) 03/08/2022 0522   PCO2ART 49 (H) 03/08/2022 0522   PO2ART 80 (L) 03/08/2022 0522   HCO3 35.7 (H) 03/08/2022 0522   TCO2 39 (H) 01/13/2018 0006   O2SAT 97.6 03/08/2022 0522     Coagulation Profile: No results for input(s): "INR", "PROTIME" in the last 168 hours.  Cardiac Enzymes: No results for input(s): "CKTOTAL", "CKMB", "CKMBINDEX", "TROPONINI" in the last 168 hours.  HbA1C: Hemoglobin A1C  Date/Time Value Ref Range Status  02/13/2018 01:52 PM 7.5 (A) 4.0 - 5.6 % Final   Hgb A1c MFr Bld  Date/Time Value Ref Range Status  02/23/2022 10:05 PM 6.5 (H) 4.8 - 5.6 % Final    Comment:    (NOTE) Pre diabetes:           5.7%-6.4%  Diabetes:              >6.4%  Glycemic control for   <7.0% adults with diabetes   01/13/2018 09:53 AM 7.3 (H) 4.8 - 5.6 % Final    Comment:    (NOTE) Pre diabetes:          5.7%-6.4% Diabetes:              >6.4% Glycemic control for   <7.0% adults with diabetes     CBG: Recent Labs  Lab 03/10/22 1555 03/10/22 1935 03/10/22 2343 03/11/22 0323 03/11/22  0730  GLUCAP 212* 286* 207* 189* 236*     Review of Systems:   UTA-patient intubated and sedated  Allergies Allergies  Allergen Reactions   Other Anaphylaxis    McCormick's Rub (Food)   Beef-Derived Products Itching    Alpha gal allergy   Pork-Derived Products Hives    Alpha gal     Home Medications  Prior to Admission medications   Medication Sig Start Date End Date Taking? Authorizing Provider  albuterol (PROVENTIL HFA;VENTOLIN HFA) 108 (90 Base) MCG/ACT inhaler Inhale 2 puffs into the lungs every 6 (six) hours as needed for wheezing or shortness of breath. 02/03/18   Azzie Glatter, FNP  aspirin 81 MG tablet Take 81 mg by mouth daily.    [provider]  blood glucose meter kit and supplies Dispense based on patient and insurance preference. Use up to four times daily as directed. (FOR ICD-10 E10.9, E11.9). 01/15/18   Eugenie Filler, MD  buPROPion (WELLBUTRIN SR) 150 MG 12 hr tablet Take 150 mg by mouth 2 (two) times daily.    [provider]  diphenhydrAMINE (BENADRYL) 25 MG tablet Take 1 tablet (25 mg total) by mouth every 6 (six) hours. Patient taking differently: Take 25 mg by mouth daily.  09/07/17   Jola Schmidt, MD  fluticasone (FLONASE) 50 MCG/ACT nasal spray Place 2 sprays into both nostrils daily. 01/16/18   Eugenie Filler, MD  folic acid (FOLVITE) 1 MG tablet Take 1 tablet (1 mg total) by mouth daily. Patient not taking: Reported on 02/03/2018 01/16/18   Eugenie Filler, MD  furosemide (LASIX) 40 MG tablet Take 1 tablet (40 mg total) by mouth daily. 02/03/18    Azzie Glatter, FNP  meloxicam (MOBIC) 15 MG tablet Take 15 mg by mouth daily.    [provider]  phentermine 15 MG capsule Take 15 mg by mouth every morning.    [provider]  traMADol (ULTRAM) 50 MG tablet Take 1 tablet (50 mg total) by mouth every 8 (eight) hours as needed. 02/13/18   Azzie Glatter, FNP  umeclidinium bromide (INCRUSE ELLIPTA) 62.5 MCG/INH AEPB Inhale 1 puff into the lungs daily. Patient not taking: Reported on 07/28/2019 02/11/18   Azzie Glatter, FNP   Scheduled Meds:  budesonide (PULMICORT) nebulizer solution  0.25 mg Nebulization BID   Chlorhexidine Gluconate Cloth  6 each Topical Daily   diazepam  5 mg Per Tube Q6H   docusate  100 mg Per Tube BID   fondaparinux (ARIXTRA) injection  2.5 mg Subcutaneous Q24H   free water  200 mL Per Tube Q2H   insulin aspart  0-20 Units Subcutaneous Q4H   insulin aspart  8 Units Subcutaneous Q4H   insulin glargine-yfgn  20 Units Subcutaneous QHS   ipratropium-albuterol  3 mL Nebulization Q6H   ofloxacin  2 drop Both Eyes QID   mouth rinse  15 mL Mouth Rinse Q2H   oxyCODONE  5 mg Per Tube Q6H   pantoprazole  40 mg Per Tube Daily   polyethylene glycol  17 g Per Tube Daily   sodium chloride flush  10-40 mL Intracatheter Q12H   Continuous Infusions:  sodium chloride Stopped (02/23/22 2350)   dexmedetomidine (PRECEDEX) IV infusion 0.4 mcg/kg/hr (03/11/22 0754)   dextrose 50 mL/hr at 03/11/22 0600   feeding supplement (PIVOT 1.5 CAL) 1,000 mL (03/11/22 0606)   fentaNYL infusion INTRAVENOUS Stopped (03/11/22 0753)   midazolam Stopped (03/10/22 1119)   PRN Meds:.acetaminophen, acetaminophen, bisacodyl,  diazepam, docusate, fentaNYL, fentaNYL (SUBLIMAZE) injection, ipratropium-albuterol, ondansetron (ZOFRAN) IV, mouth rinse, oxyCODONE, sodium chloride flush   Critical care time: 40 minutes    The patient is critically ill with multiple organ systems failure and requires high complexity decision making for  assessment and support, frequent evaluation and titration of therapies, application of advanced monitoring technologies and extensive interpretation of multiple databases.   Patient has been able to follow commands today on spontaneous awakening trial.  Discussed with family, will give trial of extubation as he is meeting criteria for the same.  This with the understanding that if he fails and requires reintubation he will definitely need tracheostomy.  We will also likely need extubation to BiPAP due to being a high suspect for obstructive sleep apnea.    Renold Don, MD Advanced Bronchoscopy PCCM [facility]    *This note was dictated using voice recognition software/Dragon.  Despite best efforts to proofread, errors can occur which can change the meaning. Any transcriptional errors that result from this process are unintentional and may not be fully corrected at the time of dictation.

## 2022-03-12 ENCOUNTER — Inpatient Hospital Stay: Payer: Medicaid Other

## 2022-03-12 LAB — CBC
HCT: 61.8 % — ABNORMAL HIGH (ref 39.0–52.0)
Hemoglobin: 17.7 g/dL — ABNORMAL HIGH (ref 13.0–17.0)
MCH: 27.6 pg (ref 26.0–34.0)
MCHC: 28.6 g/dL — ABNORMAL LOW (ref 30.0–36.0)
MCV: 96.3 fL (ref 80.0–100.0)
Platelets: 246 10*3/uL (ref 150–400)
RBC: 6.42 MIL/uL — ABNORMAL HIGH (ref 4.22–5.81)
RDW: 17.9 % — ABNORMAL HIGH (ref 11.5–15.5)
WBC: 8.4 10*3/uL (ref 4.0–10.5)
nRBC: 0 % (ref 0.0–0.2)

## 2022-03-12 LAB — PHOSPHORUS: Phosphorus: 2.4 mg/dL — ABNORMAL LOW (ref 2.5–4.6)

## 2022-03-12 LAB — BASIC METABOLIC PANEL
Anion gap: 7 (ref 5–15)
BUN: 50 mg/dL — ABNORMAL HIGH (ref 6–20)
CO2: 33 mmol/L — ABNORMAL HIGH (ref 22–32)
Calcium: 9.9 mg/dL (ref 8.9–10.3)
Chloride: 110 mmol/L (ref 98–111)
Creatinine, Ser: 0.84 mg/dL (ref 0.61–1.24)
GFR, Estimated: >60 mL/min (ref >60)
Glucose, Bld: 149 mg/dL — ABNORMAL HIGH (ref 70–99)
Potassium: 3.1 mmol/L — ABNORMAL LOW (ref 3.5–5.1)
Sodium: 150 mmol/L — ABNORMAL HIGH (ref 135–145)

## 2022-03-12 LAB — GLUCOSE, CAPILLARY
Glucose-Capillary: 160 mg/dL — ABNORMAL HIGH (ref 70–99)
Glucose-Capillary: 165 mg/dL — ABNORMAL HIGH (ref 70–99)
Glucose-Capillary: 155 mg/dL — ABNORMAL HIGH (ref 70–99)
Glucose-Capillary: 162 mg/dL — ABNORMAL HIGH (ref 70–99)
Glucose-Capillary: 132 mg/dL — ABNORMAL HIGH (ref 70–99)
Glucose-Capillary: 134 mg/dL — ABNORMAL HIGH (ref 70–99)

## 2022-03-12 LAB — MAGNESIUM: Magnesium: 1.5 mg/dL — ABNORMAL LOW (ref 1.7–2.4)

## 2022-03-12 MED ORDER — POTASSIUM CHLORIDE 10 MEQ/50ML IV SOLN
10.0000 meq | INTRAVENOUS | Status: AC
  Administered 2022-03-12 (×4): 10 meq via INTRAVENOUS
  Filled 2022-03-12 (×4): qty 50

## 2022-03-12 MED ORDER — FUROSEMIDE 10 MG/ML IJ SOLN
40.0000 mg | Freq: Every day | INTRAMUSCULAR | Status: DC
  Administered 2022-03-13 – 2022-03-16 (×4): 40 mg via INTRAVENOUS
  Filled 2022-03-12 (×4): qty 4

## 2022-03-12 MED ORDER — MAGNESIUM SULFATE 4 GM/100ML IV SOLN
4.0000 g | Freq: Once | INTRAVENOUS | Status: AC
  Administered 2022-03-12: 4 g via INTRAVENOUS
  Filled 2022-03-12: qty 100

## 2022-03-12 MED ORDER — POTASSIUM PHOSPHATES 15 MMOLE/5ML IV SOLN
15.0000 mmol | Freq: Once | INTRAVENOUS | Status: AC
  Administered 2022-03-12: 15 mmol via INTRAVENOUS
  Filled 2022-03-12: qty 5

## 2022-03-12 MED ORDER — POTASSIUM CHLORIDE 10 MEQ/50ML IV SOLN
10.0000 meq | INTRAVENOUS | Status: DC
  Administered 2022-03-12: 10 meq via INTRAVENOUS
  Filled 2022-03-12 (×6): qty 50

## 2022-03-12 NOTE — Progress Notes (Signed)
PT Cancellation Note  Patient Details Name: Bryan Mitchell MRN: [id number] DOB: 1961-06-27   Cancelled Treatment:    Reason Eval/Treat Not Completed:  (Consult received.  Per OT/SLP, patient recently completed co-evaluation with their disciplines; generally fatigued and unable to tolerate additional activity at this time.  Will continue to follow and initiate next date as appropriate.)  Rosana Farnell H. Manson Passey, PT, DPT, NCS 03/12/22, 3:36 PM [phone]

## 2022-03-12 NOTE — Evaluation (Signed)
Occupational Therapy Evaluation Patient Details Name: Bryan Mitchell MRN: [id number] DOB: [birth year] Today's Date: 03/12/2022   History of Present Illness 60 yo M presenting to [facility] ED on 02/23/22 from home for evaluation of worsening fatigue, poor appetite and SOB over the past 2 weeks. Per ED documentation, patient also complained of worsening bilateral leg swelling and pain. 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 He was prescribed home O2, and has oxygen tanks at the house but has not used them in 2 years per his sister.   Clinical Impression   Mr Fuston was seen for OT evaluation this date. Pt is poor historian, per chart PTA pt was mobile and lives alone. Pt presents to acute OT demonstrating impaired ADL performance and functional mobility 2/2 decreased safety awareness, decreased activity tolerance, and functional strength/ROM/balance deficits. Difficulty tracking and R gaze preference noted. RUE stronger than LUE.   Pt currently requires TOTAL A x2 sup<>long sitting, pt grips rail with RUE and assists minimally with trunk activation. MAX A hand over hand self-feeding at bed level. TOTAL A don/doff B socks at bed level. Pt would benefit from skilled OT to address noted impairments and functional limitations (see below for any additional details). Upon hospital discharge, recommend STR pending completion of OT trial.    Recommendations for follow up therapy are one component of a multi-disciplinary discharge planning process, led by the attending physician.  Recommendations may be updated based on patient status, additional functional criteria and insurance authorization.   Follow Up Recommendations  Skilled nursing-short term rehab (<3 hours/day) (pending trial of OT)    Assistance Recommended at Discharge Frequent or constant Supervision/Assistance  Patient can return home with the following Two people to help with walking and/or  transfers;Two people to help with bathing/dressing/bathroom    Functional Status Assessment  Patient has had a recent decline in their functional status and demonstrates the ability to make significant improvements in function in a reasonable and predictable amount of time.  Equipment Recommendations  Hospital bed    Recommendations for Other Services       Precautions / Restrictions Precautions Precautions: Fall Restrictions Weight Bearing Restrictions: No      Mobility Bed Mobility Overal bed mobility: Needs Assistance             General bed mobility comments: sup<>long sit TOTAL A x2, pt grips rail with RUE and assists minimally with trunk activation                          ADL either performed or assessed with clinical judgement   ADL Overall ADL's : Needs assistance/impaired                                       General ADL Comments: MAX A hand over hand self-feeding at bed level. TOTAL A don/doff B socks at bed level.     Vision   Vision Assessment?: Vision impaired- to be further tested in functional context Additional Comments: difficulty tracking, R gaze preference noted            Pertinent Vitals/Pain Pain Assessment Pain Assessment: Faces Faces Pain Scale: Hurts even more Pain Location: back Pain Descriptors / Indicators: Discomfort, Grimacing Pain Intervention(s): Limited activity within patient's tolerance, Repositioned     Hand Dominance     Extremity/Trunk Assessment Upper  Extremity Assessment Upper Extremity Assessment: RUE deficits/detail;LUE deficits/detail RUE Deficits / Details: 3/5 grip LUE Deficits / Details: 2-/5 grip   Lower Extremity Assessment Lower Extremity Assessment: Defer to PT evaluation       Communication Communication Communication: Expressive difficulties (garbled speech and low tone, difficult to hear pt)   Cognition Arousal/Alertness: Awake/alert Behavior During Therapy: WFL  for tasks assessed/performed Overall Cognitive Status: No family/caregiver present to determine baseline cognitive functioning Area of Impairment: Following commands, Safety/judgement, Attention                   Current Attention Level: Focused   Following Commands: Follows one step commands with increased time Safety/Judgement: Decreased awareness of safety, Decreased awareness of deficits                      Home Living Family/patient expects to be discharged to:: Unsure                                        Prior Functioning/Environment Prior Level of Function : Patient poor historian/Family not available                        OT Problem List: Decreased strength;Decreased range of motion;Decreased activity tolerance;Impaired balance (sitting and/or standing);Impaired vision/perception;Decreased coordination;Decreased safety awareness;Impaired UE functional use      OT Treatment/Interventions: Self-care/ADL training;Therapeutic exercise;Energy conservation;DME and/or AE instruction;Therapeutic activities;Patient/family education;Balance training;Cognitive remediation/compensation;Neuromuscular education    OT Goals(Current goals can be found in the care plan section) Acute Rehab OT Goals Patient Stated Goal: to eat OT Goal Formulation: With patient Time For Goal Achievement: 03/26/22 Potential to Achieve Goals: Fair ADL Goals Pt Will Perform Grooming: with set-up;with supervision;bed level Pt Will Perform Upper Body Dressing: with mod assist Pt Will Transfer to Toilet: with min assist;with +2 assist (rolling bed level) Pt/caregiver will Perform Home Exercise Program: Increased ROM;Increased strength;Both right and left upper extremity  OT Frequency: Min 2X/week    Co-evaluation PT/OT/SLP Co-Evaluation/Treatment: Yes Reason for Co-Treatment: Complexity of the patient's impairments (multi-system involvement);Necessary to address  cognition/behavior during functional activity   OT goals addressed during session: ADL's and self-care SLP goals addressed during session: Swallowing    AM-PAC OT "6 Clicks" Daily Activity     Outcome Measure Help from another person eating meals?: A Lot Help from another person taking care of personal grooming?: A Lot Help from another person toileting, which includes using toliet, bedpan, or urinal?: Total Help from another person bathing (including washing, rinsing, drying)?: Total Help from another person to put on and taking off regular upper body clothing?: A Lot Help from another person to put on and taking off regular lower body clothing?: Total 6 Click Score: 9   End of Session    Activity Tolerance: Patient tolerated treatment well Patient left: in bed;with call bell/phone within reach;with nursing/sitter in room  OT Visit Diagnosis: Other abnormalities of gait and mobility (R26.89);Muscle weakness (generalized) (M62.81)                Time: 9604-5409 OT Time Calculation (min): 29 min Charges:  OT General Charges $OT Visit: 1 Visit OT Evaluation $OT Eval Moderate Complexity: 1 Mod  Kathie Dike, M.S. OTR/L  03/12/22, 4:12 PM  ascom [phone]

## 2022-03-12 NOTE — Evaluation (Signed)
Clinical/Bedside Swallow Evaluation Patient Details  Name: Bryan Mitchell MRN: [id number] Date of Birth: [birth year]  Today's Date: 03/12/2022 Time: SLP Start Time (ACUTE ONLY): 73 SLP Stop Time (ACUTE ONLY): 1450 SLP Time Calculation (min) (ACUTE ONLY): 40 min  Past Medical History:  Past Medical History:  Diagnosis Date   (HFpEF) heart failure with preserved ejection fraction (HCC)    Allergic reaction 12/15/2016   COPD (chronic obstructive pulmonary disease) ([place])    Hyperglycemia 12/2016   Hypertension    Polycythemia    Past Surgical History:  Past Surgical History:  Procedure Laterality Date   KNEE SURGERY Right    HPI:  Pt is a 61 yo M presenting to [facility] ED on 02/23/22 from home for evaluation of worsening fatigue, poor appetite and SOB over the past 2 weeks. Per ED documentation, patient also complained of worsening bilateral leg swelling and pain. He started having increased congestion and poor PO intake also. On the night of 8/18 the sister spoke with the patient on the phone. Pt  told her, "I feel like I'm dying". On 8/19, she convinced the patient to go to the hospital to be evaluated. She reports he does not trust doctors and has difficulty following outpatient medication regimens. He was prescribed home O2, and has oxygen tanks at the house but has not used them in 2 years per his sister. He also injured his RIGHT knee a few weeks ago and had been getting around with a crutch. Sister reported he had been a little more confused on 8/19.  At admit to ICU, pt had worsening acute on chronic hypoxic/hypercapnic respiratory failure s/t suspected COPD exacerbation in the setting of HFpEF and possible pulmonary hypertension requiring emergent intubation and mechanical ventilatory support.  Pt extubated on 03/11/2022; placed on BiPAP then now on [place] O2 support.   CXR: Cardiomegaly with unchanged diffuse bilateral interstitial  pulmonary opacity and small layering pleural effusions, findings   likely edema.  MRI: No acute intracranial abnormality.  2. Chronic microvascular ischemic disease for age.    Assessment / Plan / Recommendation  Clinical Impression   Pt seen for BSE today; OT present during session also. Pt awake, inconsistent alertness and awareness w/ reduced intentional engagement w/ Therapists and po trials. Mumbled/muttered speech. Poor follow through w/ tasks including OT tasks; extremely weak Bilat UEs. MOD-MAX cues needed for follow through w/ all tasks. Somewhat fidgity in bed. Pt extubated yesterday post a lengthy oral intubation. On [place] O2, afebrile.  Pt appears to present w/ oropharyngeal phase dysphagia in setting of declined Cognitive status; declined Mental Status. Suspect impact from illness and lengthy intubation/sedation. Family reported pt having Confusion on day prior to admit. ANY Cognitive/Mental status decline can impact overall awareness/timing of swallow and safety during po tasks which increases risk for aspiration, choking.  Pt's risk for aspiration is deemed too HIGH at this time for any oral intake, including meds.         Pt consumed trials of ice chips, purees, and Nectar liquids via TSP w/ No immediate, overt clinical s/s of aspiration noted: no consistent decline in vocal quality(wet x1?); no immediate cough but throat clearing x1, and no decline in respiratory status during/post trials. O2 sats remained at his baseline of 92-94%.  HOWEVER, oral phase was c/b MOD+ deficits in poor bolus management(lacked cohesion and timely A-p transfer) and slow oral clearing w/ intermittent bolus residue (puree) noted in R anterior oral region of teeth/tongue. No Left oral residue appreciated. Pt exhibited  a MUNCHING pattern w/ bolus trials -- NOT effective for mastication of ice chips. Oral phase lacked awareness and follow through w/ po trials. He required Mod-MAX support, guidance, and verbal/tactile/visual cues d/t the Cognitive decline. He was not able to feed  himself which improves safety of swallowing.  OM Exam appeared to present w/ generalized oral weakness; slight Left labial decreased tone, no posterior lingual weakness, anterior lingual weakness appreciated. Confusion of OM tasks and oral care noted. Hand over hand guidance and visual cue were helpful to initiate oral tasks.         In setting of Cognitive decline, illness, reduced awareness and follow through, and his risk for aspiration, recommend continued NPO status w/ therapeutic po trials w/ ST services. Recommend frequent oral care for hygiene and stimulation of swallowing. NSG made aware of concern for THRUSH to address w/ MD. MD/NSG updated.  ST services will f/u daily w/ pt's status, po trials. Recommend follow w/ Palliative Care for ongoing GOC and education re: impact of Cognitive decline on swallowing and oral intake in general. Precautions posted in room. SLP Visit Diagnosis: Dysphagia, oropharyngeal phase (R13.12) (declined Cognitive status/functioning)    Aspiration Risk  Moderate aspiration risk;Risk for inadequate nutrition/hydration    Diet Recommendation   NPO w/ frequent oral care for hygiene and stimulation of swallowing  Medication Administration: Via alternative means    Other  Recommendations Recommended Consults:  (Palliative Care ongoing; Dietician f/u) Oral Care Recommendations: Oral care QID;Staff/trained caregiver to provide oral care Other Recommendations:  (TBD)    Recommendations for follow up therapy are one component of a multi-disciplinary discharge planning process, led by the attending physician.  Recommendations may be updated based on patient status, additional functional criteria and insurance authorization.  Follow up Recommendations Skilled nursing-short term rehab (<3 hours/day) (TBD)      Assistance Recommended at Discharge Frequent or constant Supervision/Assistance  Functional Status Assessment Patient has had a recent decline in their  functional status and/or demonstrates limited ability to make significant improvements in function in a reasonable and predictable amount of time  Frequency and Duration min 2x/week  2 weeks       Prognosis Prognosis for Safe Diet Advancement: Guarded Barriers to Reach Goals: Cognitive deficits;Language deficits;Time post onset;Severity of deficits;Behavior Barriers/Prognosis Comment: significant Cognitive decline currently w/ poor awareness; oropharyngeal phase dysphagia      Swallow Study   General Date of Onset: 02/23/22 HPI: Pt is a 61 yo M presenting to [facility] ED on 02/23/22 from home for evaluation of worsening fatigue, poor appetite and SOB over the past 2 weeks. Per ED documentation, patient also complained of worsening bilateral leg swelling and pain. He started having increased congestion and poor PO intake also. On the night of 8/18 the sister spoke with the patient on the phone. Pt  told her, "I feel like I'm dying". On 8/19, she convinced the patient to go to the hospital to be evaluated. She reports he does not trust doctors and has difficulty following outpatient medication regimens. He was prescribed home O2, and has oxygen tanks at the house but has not used them in 2 years per his sister. He also injured his RIGHT knee a few weeks ago and had been getting around with a crutch. Sister reported he had been a little more confused on 8/19.  At admit to ICU, pt had worsening acute on chronic hypoxic/hypercapnic respiratory failure s/t suspected COPD exacerbation in the setting of HFpEF and possible pulmonary hypertension requiring emergent  intubation and mechanical ventilatory support.  Pt extubated on 03/11/2022; placed on BiPAP then now on [place] O2 support.   CXR: Cardiomegaly with unchanged diffuse bilateral interstitial  pulmonary opacity and small layering pleural effusions, findings  likely edema.  MRI: No acute intracranial abnormality.  2. Chronic microvascular ischemic disease for  age. Type of Study: Bedside Swallow Evaluation Previous Swallow Assessment: none Diet Prior to this Study: NPO (no NGT) Temperature Spikes Noted: No (wbc 8.4) Respiratory Status: Nasal cannula (4L) History of Recent Intubation: Yes Length of Intubations (days): 16 days Date extubated: 03/11/22 Behavior/Cognition: Alert;Cooperative;Pleasant mood;Confused;Distractible;Requires cueing;Doesn't follow directions Oral Cavity Assessment: Dry;Dried secretions (patchy debris on tongue - thrush?) Oral Care Completed by SLP: Yes Oral Cavity - Dentition: Poor condition;Missing dentition Vision:  (n/a) Self-Feeding Abilities: Total assist Patient Positioning: Upright in bed (needed full assistance w/ OT and pillows to prop/sit upright in bed) Baseline Vocal Quality: Low vocal intensity (muttered/mumbled speech) Volitional Cough: Cognitively unable to elicit Volitional Swallow: Unable to elicit    Oral/Motor/Sensory Function Overall Oral Motor/Sensory Function: Moderate impairment (bilateral weakness-- generalized oral weakness.  adequate posterior lingual bunching strenght -- no gag reflex elicited w/ probing w/ tongue blade) Facial ROM: Reduced left (slight in Left corner of mouth) Facial Symmetry: Abnormal symmetry left (slight/same) Facial Strength: Reduced left (slight/same) Lingual ROM:  (reduced effort) Lingual Symmetry: Within Functional Limits Lingual Strength: Reduced;Suspected CN XII (hypoglossal) dysfunction Velum:  (CNT)   Ice Chips Ice chips: Impaired Presentation: Spoon (fed; 9 trials) Oral Phase Impairments: Poor awareness of bolus;Impaired mastication;Reduced lingual movement/coordination;Reduced labial seal Oral Phase Functional Implications: Prolonged oral transit;Oral holding Pharyngeal Phase Impairments: Suspected delayed Swallow;Throat Clearing - Delayed (x1)   Thin Liquid Thin Liquid: Not tested    Nectar Thick Nectar Thick Liquid: Impaired Presentation: Spoon (3  trials) Oral Phase Impairments: Poor awareness of bolus;Reduced lingual movement/coordination;Reduced labial seal Oral phase functional implications: Prolonged oral transit;Oral residue (min) Pharyngeal Phase Impairments: Suspected delayed Swallow   Honey Thick Honey Thick Liquid: Not tested   Puree Puree: Impaired Presentation: Spoon (fed; 5) Oral Phase Impairments: Reduced labial seal;Reduced lingual movement/coordination;Impaired mastication;Poor awareness of bolus Oral Phase Functional Implications: Prolonged oral transit;Oral residue (Right side, anteriorly) Pharyngeal Phase Impairments: Suspected delayed Swallow   Solid     Solid: Not tested        Jerilynn Som, MS, CCC-SLP Speech Language Pathologist Rehab Services; [facility] [place] [phone] (ascom) Analena Gama 03/12/2022,5:44 PM

## 2022-03-12 NOTE — Progress Notes (Addendum)
NAME:  Bryan Mitchell, MRN:  [id number], DOB:  [birth year], LOS: 71 ADMISSION DATE:  02/23/2022, INITIAL CONSULTATION DATE:  02/23/22 REFERRING MD:  Dr. Toy Cookey, CHIEF COMPLAINT:  SOB and fatigue   History of Present Illness:  61 yo M presenting to [facility] ED on 02/23/22 from home in a wheelchair with his sister for evaluation of worsening fatigue, poor appetite and SOB over the past 2 weeks. Per ED documentation, patient also complained of worsening bilateral leg swelling and pain.  Sister interviewed over the telephone providing supplemental history. She described that he lives alone, and began not feeling good for about 2 weeks with complaints of dyspnea and fatigue. He self-treated with Mucinex and took a COVID test which was negative. He started having increased congestion and poor PO intake. On the night of 8/18 the sister spoke with the patient on the phone and he told her, "I feel like I'm dying". Today on 8/19 she convinced the patient to go to the hospital to be evaluated. She reports he does not trust doctors and has difficulty following outpatient medication regimens. He was prescribed home O2, and has oxygen tanks at the house but has not used them in 2 years per his sister. He also injured his RIGHT knee a few weeks ago and had been getting around with a crutch. She reported he had been a little more confused on 8/19.  Per ED documentation, he endorsed a productive cough, but denied fever/chills. Patient admitted to not taking his Lasix when interviewed by [facility] service, per their documentation. And he has noticed increased pedal edema recently.  ED course: Upon arrival in triage the patient was profoundly hypoxic with his SpO2 was in the 60's on RA, this initially recovered to 99% on 3 L [place]. ABG revealed partially compensated hypercapnia and he was placed on BIPAP. He received Lasix for diuresis, BNP was mildly elevated. CT angio negative for pulmonary edema/effusions, negative for PE. [facility]  service consulted for admission. After a couple of hours on BIPAP, patient became combative and agitated, ripping off the BIPAP mask. Patient emergently intubated in ED. PCCM consulted. Medications given: IV contrast, Duo-nebs, lasix, fondaparinux, doxycycline, fentanyl, versed, ketamine, rocuronium & succinylcholine for intubation, solumedrol, propofol drip started Initial Vitals: 97.9, 20, 100, 117/68 & SpO2 62% on RA Significant labs: (Labs/ Imaging personally reviewed) I, Domingo Pulse Rust[place], AGACNP-BC, personally viewed and interpreted this ECG. EKG Interpretation: Date: 02/23/22, EKG Time: 16:27, Rate: 98, Rhythm: NSR, QRS Axis:  borderline RAD, Intervals: normal, ST/T Wave abnormalities: inferior T wave inversions, Narrative Interpretation: NSR with borderline RAD and probable LAE with inferior T wave inversions Chemistry: Na+: 139, K+: 3.8, BUN/Cr.: 31/0.79, Serum CO2/ AG: 32/8 Hematology: WBC: 6.9, Hgb: 18.9, Hct: 63.3, plt: 178 BNP: 283.7, PCT: 0.22, COVID-19 & Influenza A/B: negative ABG: 7.25/ 99/ 59/ 43.4 CXR 02/23/22: pulmonary hypoinflation. Central pulmonary vascular engorgement without overt pulmonary edema. Stable tubes CT 02/23/22: no evidence of pulmonary embolism. Mild cardiomegaly, CAD, main pulmonary trunk is dilated measuring 3.9 cm suggesting pulmonary arterial hypertension. Mild bibasilar atelectasis. No evidence of pulmonary edema or pleural effusion. CT head wo contrast 02/23/22: No acute intracranial abnormality.  PCCM consulted due to worsening acute on chronic hypoxic/hypercapnic respiratory failure requiring emergent intubation and mechanical ventilatory support.  Pertinent  Medical History  T2DM COPD Morbid obesity Tobacco abuse (cigars, 40 pack year history) Secondary Polycythemia HFpEF  Micro Data:  8/19: SARS-CoV-2 & Influenza PCR>>negative 8/19: RVP>> Rhinovirus/Enterovirus 8/19: MRSA PCR>> negative 8/20: Tracheal aspirate>>normal flora 8/28:  Tracheal aspirate >>normal flora 8/31: Repeat Tracheal aspirate>> rare gram + rods  Antimicrobials:  Ceftriaxone 8/19>>8/23 Doxycycline 8/19>>8/23  Significant Hospital Events: Including procedures, antibiotic start and stop dates in addition to other pertinent events   02/23/22: Admit to ICU with worsening acute on chronic hypoxic/hypercapnic respiratory failure s/t suspected COPD exacerbation in the setting of HFpEF and possible pulmonary hypertension requiring emergent intubation and mechanical ventilatory support. 02/24/22: Pt remains mechanically intubated settings decreased: PEEP 5/FiO2 40%.   02/25/22: Increased vent requirements this morning due to diffuse pulmonary vascular congestion, received lasix. Tracheal aspirate with gram + cocci & rods, gram - rods.  Severe Delirium upon WUA. 02/26/22: Remains on vent, 60% fio2 & 8 PEEP.  Diurese with Diamox + Metolazone. 02/27/22: No acute events overnight; remains mechanically intubated current vent settings: 40%/8 PEEP  02/28/22: Pts O2 requirements increased overnight due to hypoxia vent settings: FiO2 50%/PEEP 8 unable to perform SBT  03/01/22: Overnight with hypoxia, concern for ? Mucus plug.  Vent settings this am 100% FiO2 & 8 PEEP.  Plan for Diuresis, considering Bronchoscopy 03/02/22: Continued high vent settings (100%, 12 PEEP), start recruitment maneuvers, considering bronch.  Diurese. 03/03/22: Continued high vent settings (100% FiO2, 12 PEEP). Diuresing.  Obtain CT Angiogram Chest 03/03/22: CTA Chest revealed new bilateral lower lobe collapse or         consolidation since 02/23/2022. Very small superimposed          layering pleural effusions. No convincing acute pulmonary        edema. Suboptimal pulmonary artery contrast bolus timing. No          central pulmonary embolus. Satisfactory endotracheal and        enteric tubes. Aortic Atherosclerosis (ICD10-I70.0). 03/04/22: Pt currently proned and tolerating.  Remains on high ventilator  settings: FiO2 80%/PEEP 15/PC O2 sats 99% 03/05/22: Pt remains mechanically intubated vent settings currently FiO2 55%/PEEP 12.  Palliative Care working with family regarding goals of care  03/06/22: MRI Brain negative.  03/07/22: Remains intubated, vent settings 60% FiO2, 10 PEEP.  Consult ENT for Tracheostomy.  Low grade temperature (T max 99.8), reculture tracheal aspirate.  Diurese with Diamox + Metolazone 03/08/22: Low grade fever (T max 100.7), repeat TA with rare gram + rods, but PCT normal and no Leukocytosis.  Check Venous US to r/o DVT as potential cause of fever, along with UA.  Low threshold to start ABX.  Vent requirements decreased to 40% FiO2 & 10 PEEP. Creatinine remains stable, diurese with Diamox + Metolazone. 03/09/2022: Remains on the ventilator, FiO2 at 40%.  Continues to require 10 of PEEP. Discontinued diuretics. 03/10/2022: Foley placed due to urinary retention 03/12/2022: extubated, follows commands. Weak cough  Interim History / Subjective:  As outlined above under significant events.  No significant issues overnight.  Objective   Blood pressure (!) 141/83, pulse (!) 103, temperature 98.7 F (37.1 C), temperature source Axillary, resp. rate (!) 23, height 5' 8"  (1.727 m), weight 128.8 kg, SpO2 90 %.    Vent Mode: PSV FiO2 (%):  [35 %-50 %] 50 % PEEP:  [5 cmH20] 5 cmH20 Pressure Support:  [5 cmH20-12 cmH20] 5 cmH20   Intake/Output Summary (Last 24 hours) at 03/12/2022 0833 Last data filed at 03/12/2022 0600 Gross per 24 hour  Intake 2639.18 ml  Output 3702 ml  Net -1062.82 ml    Filed Weights   03/10/22 0346 03/11/22 0500 03/12/22 0408  Weight: 135.4 kg 134.7 kg 128.8 kg    Examination:  Physical  Exam Constitutional:      General: He is not in acute distress.    Appearance: He is obese. He is ill-appearing and toxic-appearing.  HENT:     Head: Normocephalic.  Eyes:     Pupils: Pupils are equal, round, and reactive to light.  Cardiovascular:     Rate and Rhythm:  Normal rate and regular rhythm.     Heart sounds: Normal heart sounds.  Pulmonary:     Breath sounds: Rhonchi and rales present. No wheezing.  Abdominal:     General: There is distension.     Palpations: Abdomen is soft.  Skin:    Findings: No rash.  Neurological:     Mental Status: He is disoriented.      Resolved Hospital Problem list   Hypotension secondary to possible sepsis vs sedation related   Assessment & Plan:   Acute on Chronic Hypoxic and Hypercapnic Respiratory Failure in the setting of suspected COPD exacerbation, decompensated HFpEF and rhinovirus Tobacco Abuse PMHx: COPD, morbid obesity, tobacco abuse, HFpEF   CTA Chest 08/27: new bilateral lower lobe collapse or consolidation; negative for central PE  - Extubated 9/4, tolerated BiPAP overnight, on nasal cannula this morning - Will benefit from nocturnal BiPAP. Will initiate in the ICU, would benefit from it on discharge. - Follow intermittent Chest X-ray & ABG as needed - Bronchodilators - IV steroids discontinued, course completed - Completed course of ABx - Repeat tracheal aspirate results: noncontributory - Diuresis as BP and renal function permits  Acute on Chronic HFpEF exacerbation Mildly elevated Troponin, due to demand ischemia - RESOLVED Pulmonary HTN PMHx: HFpEF  Echocardiogram 02/24/22: LVEF 60-65%, Grade II DD, Pulm HTN - Continuous  telemetry monitoring - Maintain MAP >65 - Troponins have stabilized - held diuretics due to acute kidney injury - restart diuretics tomorrow, will initiate furosemide daily  Acute kidney injury - resolved Suspect due to overzealous diuresis Urinary retention Hypernatremia Increase free water Hoeld diuretics Foley catheter monitor I&O  Rhinovirus New Low Grade Fever 9/1 - Trend WBC and monitor fever curve  - Completed course of abx therapy due to suspected bacterial pneumonia, tracheal aspirate with normal respiratory flora - Repeat tracheal aspirate  noncontrib, procalcitonin remains negative and remains without leukocytosis ~ low threshold to start ABX - No foley catheter in place, will need foley due to increased BUN/Cr. ? obstruction - DVT study negative  Polycythemia suspected secondary to chronic hypoxia ~improving  Followed by oncology outpatient, last seen 10/2019. He was offered a sleep study which he declined. At that time, he was deemed not a candidate for continued follow up as Hgb had stabilized at 17.0 & HCT was 51%.  Hgb: 18.9, Hct: 63.3 - Daily CBC    Type 2 Diabetes Mellitus Hemoglobin A1C: 6.5 - Monitor CBG Q 4 hours - SSI resistant dosing + Tube feeding coverage + basal - Steroids D/C d - Target range while in ICU: 140-180 - Follow ICU hyper/hypo-glycemia protocol  Acute Metabolic Encephalopathy Sedation needs in the setting of mechanical ventilation  CT Head negative 8/19 Follow up MRI Brain negative 8/30 - Treatment of metabolic derangements as outlined above - Resolving following discontinuation of sedation  Hypernatremia - Increase free water - Urine studies given increased UO (Post ATN diuresis)   Pt is critically ill.  Prognosis is guarded.  High risk for cardiac arrest and death.   Best Practice (right click and "Reselect all SmartList Selections" daily)  Diet/type: NPO; SLP evaluation DVT prophylaxis: Arixtra GI prophylaxis:  PPI Lines: Left IJ central line and is still needed Foley: Foley catheter placed due to urinary retention Code Status:  full code Last date of multidisciplinary goals of care discussion [03/08/22]  Labs   CBC: Recent Labs  Lab 03/06/22 0506 03/07/22 0442 03/08/22 0412 03/09/22 0500 03/10/22 0422 03/11/22 0542 03/12/22 0433  WBC 7.0 6.4 6.3 6.4 7.3 7.2 8.4  NEUTROABS 4.8 4.3  --   --   --   --   --   HGB 17.2* 17.0 17.3* 16.0 17.3* 17.0 17.7*  HCT 57.5* 56.1* 58.5* 54.9* 59.1* 58.5* 61.8*  MCV 92.6 93.0 94.2 96.3 96.6 97.7 96.3  PLT 245 217 222 204 222 200 246      Basic Metabolic Panel: Recent Labs  Lab 03/08/22 0412 03/09/22 0500 03/10/22 0422 03/11/22 0542 03/12/22 0433  NA 145 147* 152* 152* 150*  K 4.2 4.6 3.8 3.6 3.1*  CL 108 113* 114* 110 110  CO2 32 27 33* 33* 33*  GLUCOSE 181* 163* 174* 207* 149*  BUN 76* 90* 88* 85* 50*  CREATININE 1.07 1.49* 1.52* 1.10 0.84  CALCIUM 9.7 9.1 9.9 10.0 9.9  MG 2.4 2.8* 2.6* 2.1 1.5*  PHOS 4.3 5.5* 4.5 3.9 2.4*    GFR: Estimated Creatinine Clearance: 124 mL/min (by C-G formula based on SCr of 0.84 mg/dL). Recent Labs  Lab 03/07/22 0442 03/08/22 0412 03/09/22 0500 03/10/22 0422 03/11/22 0542 03/12/22 0433  PROCALCITON 0.10 0.14  --   --   --   --   WBC 6.4 6.3 6.4 7.3 7.2 8.4     Liver Function Tests: No results for input(s): "AST", "ALT", "ALKPHOS", "BILITOT", "PROT", "ALBUMIN" in the last 168 hours. No results for input(s): "LIPASE", "AMYLASE" in the last 168 hours. No results for input(s): "AMMONIA" in the last 168 hours.  ABG    Component Value Date/Time   PHART 7.47 (H) 03/08/2022 0522   PCO2ART 49 (H) 03/08/2022 0522   PO2ART 80 (L) 03/08/2022 0522   HCO3 35.7 (H) 03/08/2022 0522   TCO2 39 (H) 01/13/2018 0006   O2SAT 97.6 03/08/2022 0522     Coagulation Profile: No results for input(s): "INR", "PROTIME" in the last 168 hours.  Cardiac Enzymes: No results for input(s): "CKTOTAL", "CKMB", "CKMBINDEX", "TROPONINI" in the last 168 hours.  HbA1C: Hemoglobin A1C  Date/Time Value Ref Range Status  02/13/2018 01:52 PM 7.5 (A) 4.0 - 5.6 % Final   Hgb A1c MFr Bld  Date/Time Value Ref Range Status  02/23/2022 10:05 PM 6.5 (H) 4.8 - 5.6 % Final    Comment:    (NOTE) Pre diabetes:          5.7%-6.4%  Diabetes:              >6.4%  Glycemic control for   <7.0% adults with diabetes   01/13/2018 09:53 AM 7.3 (H) 4.8 - 5.6 % Final    Comment:    (NOTE) Pre diabetes:          5.7%-6.4% Diabetes:              >6.4% Glycemic control for   <7.0% adults with  diabetes     CBG: Recent Labs  Lab 03/11/22 1628 03/11/22 1941 03/11/22 2353 03/12/22 0412 03/12/22 0750  GLUCAP 190* 196* 150* 160* 165*     Review of Systems:   UTA-patient intubated and sedated  Allergies Allergies  Allergen Reactions   Other Anaphylaxis    McCormick's Rub (Food)   Beef-Derived Products  Itching    Alpha gal allergy   Pork-Derived Products Hives    Alpha gal     Home Medications  Prior to Admission medications   Medication Sig Start Date End Date Taking? Authorizing Provider  albuterol (PROVENTIL HFA;VENTOLIN HFA) 108 (90 Base) MCG/ACT inhaler Inhale 2 puffs into the lungs every 6 (six) hours as needed for wheezing or shortness of breath. 02/03/18   Azzie Glatter, FNP  aspirin 81 MG tablet Take 81 mg by mouth daily.    [provider]  blood glucose meter kit and supplies Dispense based on patient and insurance preference. Use up to four times daily as directed. (FOR ICD-10 E10.9, E11.9). 01/15/18   Eugenie Filler, MD  buPROPion (WELLBUTRIN SR) 150 MG 12 hr tablet Take 150 mg by mouth 2 (two) times daily.    [provider]  diphenhydrAMINE (BENADRYL) 25 MG tablet Take 1 tablet (25 mg total) by mouth every 6 (six) hours. Patient taking differently: Take 25 mg by mouth daily.  09/07/17   Jola Schmidt, MD  fluticasone (FLONASE) 50 MCG/ACT nasal spray Place 2 sprays into both nostrils daily. 01/16/18   Eugenie Filler, MD  folic acid (FOLVITE) 1 MG tablet Take 1 tablet (1 mg total) by mouth daily. Patient not taking: Reported on 02/03/2018 01/16/18   Eugenie Filler, MD  furosemide (LASIX) 40 MG tablet Take 1 tablet (40 mg total) by mouth daily. 02/03/18   Azzie Glatter, FNP  meloxicam (MOBIC) 15 MG tablet Take 15 mg by mouth daily.    [provider]  phentermine 15 MG capsule Take 15 mg by mouth every morning.    [provider]  traMADol (ULTRAM) 50 MG tablet Take 1 tablet (50 mg total) by mouth every 8  (eight) hours as needed. 02/13/18   Azzie Glatter, FNP  umeclidinium bromide (INCRUSE ELLIPTA) 62.5 MCG/INH AEPB Inhale 1 puff into the lungs daily. Patient not taking: Reported on 07/28/2019 02/11/18   Azzie Glatter, FNP   Scheduled Meds:  budesonide (PULMICORT) nebulizer solution  0.25 mg Nebulization BID   Chlorhexidine Gluconate Cloth  6 each Topical Daily   docusate  100 mg Per Tube BID   fondaparinux (ARIXTRA) injection  2.5 mg Subcutaneous Q24H   insulin aspart  0-20 Units Subcutaneous Q4H   insulin aspart  8 Units Subcutaneous Q4H   insulin glargine-yfgn  20 Units Subcutaneous QHS   ipratropium-albuterol  3 mL Nebulization Q6H   metoprolol tartrate  5 mg Intravenous Q8H   ofloxacin  2 drop Both Eyes QID   mouth rinse  15 mL Mouth Rinse 4 times per day   pantoprazole  40 mg Per Tube Daily   polyethylene glycol  17 g Per Tube Daily   sodium chloride flush  10-40 mL Intracatheter Q12H   Continuous Infusions:  sodium chloride Stopped (02/23/22 2350)   dextrose 75 mL/hr at 03/12/22 0600   potassium chloride     potassium PHOSPHATE IVPB (in mmol)     PRN Meds:.acetaminophen, acetaminophen, bisacodyl, docusate, ipratropium-albuterol, ondansetron (ZOFRAN) IV, mouth rinse, sodium chloride flush   Critical care time: 33 minutes    The patient is critically ill with multiple organ systems failure and requires high complexity decision making for assessment and support, frequent evaluation and titration of therapies, application of advanced monitoring technologies and extensive interpretation of multiple databases.

## 2022-03-12 NOTE — Progress Notes (Signed)
Daily Progress Note   Patient Name: Bryan Mitchell       Date: 03/12/2022 DOB: 1961-01-28  Age: 61 y.o. MRN#: [id number] Attending Physician: Raechel Chute, MD Primary Care Physician: Pcp, No Admit Date: 02/23/2022  Reason for Consultation/Follow-up: Establishing goals of care  Subjective: Notes and labs reviewed.  Into see patient who has been extubated.  He is currently sitting in a bedside chair with nasal cannula in place.  No family at bedside.  With a great amount of prompting, and asking the same question in multiple different ways, he will answer some questions, either with an inaudible vocal level, or he simply say yes or no.  He indicates that he would not want to be put back on a machine that breathes for him if he is unable to breathe himself.  He also indicates that he would not want chest compressions, shocks or breathing tube if his heart or breathing stop.  He seems to be aware that if these interventions are needed in the future but withheld, he could die, or if in arrest, remain dead.  Given his lack of responses and the amount of prompting needed for the responses I did get, these issues will need to be readdressed again at a later time.  I did indicate to him that he needs to discuss his wishes with his Sister Lupita Leash as well.  Length of Stay: 17  Current Medications: Scheduled Meds:   budesonide (PULMICORT) nebulizer solution  0.25 mg Nebulization BID   Chlorhexidine Gluconate Cloth  6 each Topical Daily   docusate  100 mg Per Tube BID   fondaparinux (ARIXTRA) injection  2.5 mg Subcutaneous Q24H   insulin aspart  0-20 Units Subcutaneous Q4H   insulin aspart  8 Units Subcutaneous Q4H   insulin glargine-yfgn  20 Units Subcutaneous QHS   ipratropium-albuterol  3 mL Nebulization  Q6H   metoprolol tartrate  5 mg Intravenous Q8H   ofloxacin  2 drop Both Eyes QID   mouth rinse  15 mL Mouth Rinse 4 times per day   pantoprazole  40 mg Per Tube Daily   polyethylene glycol  17 g Per Tube Daily   sodium chloride flush  10-40 mL Intracatheter Q12H    Continuous Infusions:  sodium chloride Stopped (02/23/22 2350)   dextrose 75 mL/hr at 03/12/22  0600   potassium chloride     potassium PHOSPHATE IVPB (in mmol)      PRN Meds: acetaminophen, acetaminophen, bisacodyl, docusate, ipratropium-albuterol, ondansetron (ZOFRAN) IV, mouth rinse, sodium chloride flush  Physical Exam Pulmonary:     Effort: Pulmonary effort is normal.  Neurological:     Mental Status: He is alert.             Vital Signs: BP (!) 148/90   Pulse 99   Temp 98.7 F (37.1 C) (Axillary)   Resp (!) 29   Ht 5\' 8"  (1.727 m)   Wt 128.8 kg   SpO2 93%   BMI 43.17 kg/m  SpO2: SpO2: 93 % O2 Device: O2 Device: Nasal Cannula O2 Flow Rate: O2 Flow Rate (L/min): 4 L/min  Intake/output summary:  Intake/Output Summary (Last 24 hours) at 03/12/2022 1248 Last data filed at 03/12/2022 0800 Gross per 24 hour  Intake 2629.18 ml  Output 3550 ml  Net -920.82 ml   LBM: Last BM Date : 03/11/22 Baseline Weight: Weight: (!) 145.2 kg Most recent weight: Weight: 128.8 kg   Patient Active Problem List   Diagnosis Date Noted   Pressure injury of skin 03/06/2022   Acute hypoxemic respiratory failure (HCC) 02/23/2022   Acute respiratory failure (HCC) 02/23/2022   COPD exacerbation (HCC)    Type 2 diabetes mellitus with complication, without long-term current use of insulin (HCC)    Edema of lower extremity    Acute respiratory failure with hypoxia (HCC) 01/13/2018   Morbid obesity (HCC) 01/13/2018   Hypoxia 12/16/2016   Cellulitis and abscess of trunk 12/16/2016   Tobacco abuse 12/16/2016   Allergic reaction 12/16/2016   Polycythemia 12/16/2016    Palliative Care Assessment & Plan    Recommendations/Plan: Patient is currently extubated. It seems that he would not want to be placed back on the ventilator or to have resuscitative efforts, but given his responses and the prompting required, this will need to be addressed again at a later time.  Code Status:    Code Status Orders  (From admission, onward)           Start     Ordered   02/23/22 2142  Full code  Continuous        02/23/22 2141           Code Status History     Date Active Date Inactive Code Status Order ID Comments User Context   02/23/2022 1902 02/23/2022 2141 Full Code 2142  [id number], MD ED   01/13/2018 0354 01/15/2018 1908 Full Code 03/18/2018  [id number], MD Inpatient   12/16/2016 0533 12/16/2016 1853 Full Code 02/15/2017  [id number], MD ED       Care plan was discussed with RN  Thank you for allowing the Palliative Medicine Team to assist in the care of this patient.   Clydie Braun, NP  Please contact Palliative Medicine Team phone at [phone] for questions and concerns.

## 2022-03-12 NOTE — Consult Note (Signed)
PHARMACY CONSULT NOTE  Pharmacy Consult for Electrolyte Monitoring and Replacement   Recent Labs: Potassium (mmol/L)  Date Value  03/12/2022 3.1 (L)  04/21/2012 4.8   Magnesium (mg/dL)  Date Value  78/67/6720 1.5 (L)   Calcium (mg/dL)  Date Value  94/70/9628 9.9   Calcium, Total (mg/dL)  Date Value  36/62/9476 8.8   Albumin (g/dL)  Date Value  54/65/0354 4.1  04/21/2012 4.0   Phosphorus (mg/dL)  Date Value  65/68/1275 2.4 (L)   Sodium (mmol/L)  Date Value  03/12/2022 150 (H)  04/21/2012 139   Assessment: Pharmacy has been consulted to monitor electrolytes in 60yo male presenting to the ED complaining of worsening bilateral leg swelling and pain. He was self-treating with mucinex and took a COVID test which was negative. He is currently intubated and sedated in CCU. Pharmacy is asked to follow and replace electrolytes while in CCU  Nutrition: Tube feeds @70  ml/h>OFF, FWF q4h>OFF MIVF: D5W at 100>82mL/hr   Goal of Therapy:  Electrolytes within normal limits   Plan:  Na 145>147>152>150: D5W adjusted to 43ml/h K 3.6>3.1: replete with Kcl 72m q1h x4 doses ( from Kphos below) Mg2.1>1.5: replete with MgSO 4g IV x1 dose per CCM team. Phos 3.9>2.4: replete with of IV Kphos  Follow-up electrolytes with AM labs tomorrow  03/12/2022 7:54 AM

## 2022-03-12 NOTE — Progress Notes (Signed)
Nutrition Follow Up Note   DOCUMENTATION CODES:   Morbid obesity  INTERVENTION:   RD will add supplements once pt's diet is advanced  Pt at high refeed risk; recommend monitor potassium, magnesium and phosphorus labs daily until stable  NUTRITION DIAGNOSIS:   Inadequate oral intake related to inability to eat (pt sedated and ventilated) as evidenced by NPO status.  GOAL:   Patient will meet greater than or equal to 90% of their needs -previously met with tube feeds   MONITOR:   Diet advancement, Labs, Weight trends, Skin, I & O's  ASSESSMENT:   61 y/o male with h/o COPD, DM, HTN, HLD, CHF, polycythemia and alpha gal who is admitted with rhinovirus, PNA and AMS.  Pt extubated yesterday. Pt placed on bipap overnight but is on nasal canula today. Pt sitting up in chair at time of RD visit. SLP evaluation pending. RD will add supplements once pt's diet is advanced. Pt with hyperglycemia and is receiving IVF. Pt is refeeding; electrolytes being replaced. Per chart, pt is down ~36lbs from his UBW. Pt -1.2L on his I & Os. Palliative care following for GOC.   Medications reviewed and include: colace, lasix, insulin, protonix, miralax, 5% dextrose _0 /hr, KPhos  Labs reviewed: Na 150(H), K 3.1(L), BUN 50(H), P 2.4(L), Mg 1.5(L) BNP- 123.4(H)- 9/4 Cbgs- 155, 165, 160 x 24 hrs  Diet Order:    Diet Order             Diet NPO time specified  Diet effective now                  EDUCATION NEEDS:   No education needs have been identified at this time  Skin:  Skin Assessment: Reviewed RN Assessment (ecchymosis)  Last BM:  9/5- type 7  Height:   Ht Readings from Last 1 Encounters:  02/23/22 _1  (1.727 m)    Weight:   Wt Readings from Last 1 Encounters:  03/12/22 128.8 kg    Ideal Body Weight:  70 kg  BMI:  Body mass index is 43.17 kg/m.  Estimated Nutritional Needs:   Kcal:  2600-2900kcal/day  Protein:  >130g/day  Fluid:  2.1-2.4L/day  Koleen Distance MS, RD, LDN Please refer to [facility] for RD and/or RD on-call/weekend/after hours pager

## 2022-03-12 NOTE — TOC Progression Note (Signed)
Transition of Care [facility]) - Progression Note    Patient Details  Name: Bryan Mitchell MRN: [id number] Date of Birth: Nov 05, 1960  Transition of Care [facility]) CM/SW Contact  Allayne Butcher, RN Phone Number: 03/12/2022, 10:52 AM  Clinical Narrative:    Patient extubated yesterday to Bipap.  Patient is currently tolerating [place] at 4L.  Palliative has been in speaking with patient for GOC. TOC will follow.    Expected Discharge Plan:  (TBD) Barriers to Discharge: Continued Medical Work up  Expected Discharge Plan and Services Expected Discharge Plan:  (TBD)   Discharge Planning Services: CM Consult   Living arrangements for the past 2 months: Single Family Home                                       Social Determinants of Health (SDOH) Interventions    Readmission Risk Interventions     No data to display

## 2022-03-13 ENCOUNTER — Inpatient Hospital Stay: Payer: Medicaid Other

## 2022-03-13 DIAGNOSIS — H5702 Anisocoria: Secondary | ICD-10-CM

## 2022-03-13 DIAGNOSIS — G928 Other toxic encephalopathy: Secondary | ICD-10-CM

## 2022-03-13 LAB — BASIC METABOLIC PANEL
Anion gap: 6 (ref 5–15)
BUN: 38 mg/dL — ABNORMAL HIGH (ref 6–20)
CO2: 34 mmol/L — ABNORMAL HIGH (ref 22–32)
Calcium: 9.7 mg/dL (ref 8.9–10.3)
Chloride: 109 mmol/L (ref 98–111)
Creatinine, Ser: 0.67 mg/dL (ref 0.61–1.24)
GFR, Estimated: >60 mL/min (ref >60)
Glucose, Bld: 129 mg/dL — ABNORMAL HIGH (ref 70–99)
Potassium: 3.3 mmol/L — ABNORMAL LOW (ref 3.5–5.1)
Sodium: 149 mmol/L — ABNORMAL HIGH (ref 135–145)

## 2022-03-13 LAB — GLUCOSE, CAPILLARY
Glucose-Capillary: 118 mg/dL — ABNORMAL HIGH (ref 70–99)
Glucose-Capillary: 141 mg/dL — ABNORMAL HIGH (ref 70–99)
Glucose-Capillary: 128 mg/dL — ABNORMAL HIGH (ref 70–99)
Glucose-Capillary: 122 mg/dL — ABNORMAL HIGH (ref 70–99)
Glucose-Capillary: 114 mg/dL — ABNORMAL HIGH (ref 70–99)
Glucose-Capillary: 140 mg/dL — ABNORMAL HIGH (ref 70–99)

## 2022-03-13 LAB — CBC
HCT: 59.7 % — ABNORMAL HIGH (ref 39.0–52.0)
Hemoglobin: 17.3 g/dL — ABNORMAL HIGH (ref 13.0–17.0)
MCH: 27.7 pg (ref 26.0–34.0)
MCHC: 29 g/dL — ABNORMAL LOW (ref 30.0–36.0)
MCV: 95.7 fL (ref 80.0–100.0)
Platelets: 278 10*3/uL (ref 150–400)
RBC: 6.24 MIL/uL — ABNORMAL HIGH (ref 4.22–5.81)
RDW: 16.9 % — ABNORMAL HIGH (ref 11.5–15.5)
WBC: 8.7 10*3/uL (ref 4.0–10.5)
nRBC: 0 % (ref 0.0–0.2)

## 2022-03-13 LAB — PHOSPHORUS: Phosphorus: 3.6 mg/dL (ref 2.5–4.6)

## 2022-03-13 LAB — MAGNESIUM: Magnesium: 1.6 mg/dL — ABNORMAL LOW (ref 1.7–2.4)

## 2022-03-13 MED ORDER — POTASSIUM CHLORIDE 10 MEQ/50ML IV SOLN
10.0000 meq | INTRAVENOUS | Status: AC
  Administered 2022-03-13 (×6): 10 meq via INTRAVENOUS
  Filled 2022-03-13 (×6): qty 50

## 2022-03-13 MED ORDER — MAGNESIUM SULFATE 4 GM/100ML IV SOLN
4.0000 g | Freq: Once | INTRAVENOUS | Status: AC
  Administered 2022-03-13: 4 g via INTRAVENOUS
  Filled 2022-03-13: qty 100

## 2022-03-13 NOTE — Progress Notes (Signed)
OT Cancellation Note  Patient Details Name: Rogers Ditter MRN: [id number] DOB: 23-Feb-1961   Cancelled Treatment:    Reason Eval/Treat Not Completed: Patient at procedure or test/ unavailable. Chart reviewed, pt currently off the unit for head CT, will re-attempt as pt appropriate and available.   Kathie Dike, M.S. OTR/L  03/13/22, 11:07 AM  ascom [phone]

## 2022-03-13 NOTE — Progress Notes (Signed)
Daily Progress Note   Patient Name: Bryan Mitchell       Date: 03/13/2022 DOB: Nov 22, 1960  Age: 61 y.o. MRN#: [id number] Attending Physician: Raechel Chute, MD Primary Care Physician: Pcp, No Admit Date: 02/23/2022  Reason for Consultation/Follow-up: Establishing goals of care  Subjective: Notes and labs reviewed. In to see patient. No family at bedside. IV RN at bedside and patient not wanting an IV placed. Attempted to discuss his wishes and discuss his care moving forward. He does not answer questions in regards to care he would or would not want, or who he would want as his surrogate decision maker. He is able to tell me he is in ICU. He states he would love to be able to leave out the window and he laughed when told he is on the second floor, and it sounded as though he said "oh I don't want to do that then."  Patient ultimately allowed IV to be placed. His cognitive status has improved from yesterday. Will re-attempt tomorrow.    Length of Stay: 18  Current Medications: Scheduled Meds:   budesonide (PULMICORT) nebulizer solution  0.25 mg Nebulization BID   Chlorhexidine Gluconate Cloth  6 each Topical Daily   docusate  100 mg Per Tube BID   fondaparinux (ARIXTRA) injection  2.5 mg Subcutaneous Q24H   furosemide  40 mg Intravenous Daily   insulin aspart  0-20 Units Subcutaneous Q4H   insulin aspart  8 Units Subcutaneous Q4H   insulin glargine-yfgn  20 Units Subcutaneous QHS   ipratropium-albuterol  3 mL Nebulization Q6H   metoprolol tartrate  5 mg Intravenous Q8H   ofloxacin  2 drop Both Eyes QID   mouth rinse  15 mL Mouth Rinse 4 times per day   polyethylene glycol  17 g Per Tube Daily   sodium chloride flush  10-40 mL Intracatheter Q12H    Continuous Infusions:  sodium chloride  Stopped (02/23/22 2350)   dextrose 75 mL/hr at 03/13/22 1243    PRN Meds: acetaminophen, acetaminophen, bisacodyl, docusate, ipratropium-albuterol, mouth rinse, sodium chloride flush  Physical Exam Pulmonary:     Effort: Pulmonary effort is normal.  Neurological:     Mental Status: He is alert.             Vital Signs: BP 120/72   Pulse 93  Temp 99.2 F (37.3 C) (Axillary)   Resp (!) 27   Ht 5\' 8"  (1.727 m)   Wt 128.8 kg   SpO2 94%   BMI 43.17 kg/m  SpO2: SpO2: 94 % O2 Device: O2 Device: (S) High Flow Nasal Cannula O2 Flow Rate: O2 Flow Rate (L/min): 45 L/min  Intake/output summary:  Intake/Output Summary (Last 24 hours) at 03/13/2022 1419 Last data filed at 03/13/2022 1243 Gross per 24 hour  Intake 2838.33 ml  Output 3565 ml  Net -726.67 ml   LBM: Last BM Date : 03/12/22 Baseline Weight: Weight: (!) 145.2 kg Most recent weight: Weight: 128.8 kg    Patient Active Problem List   Diagnosis Date Noted   Toxic metabolic encephalopathy    Anisocoria    Pressure injury of skin 03/06/2022   Acute hypoxemic respiratory failure (HCC) 02/23/2022   Acute respiratory failure (HCC) 02/23/2022   COPD exacerbation (HCC)    Type 2 diabetes mellitus with complication, without long-term current use of insulin (HCC)    Edema of lower extremity    Acute respiratory failure with hypoxia (HCC) 01/13/2018   Morbid obesity (HCC) 01/13/2018   Hypoxia 12/16/2016   Cellulitis and abscess of trunk 12/16/2016   Tobacco abuse 12/16/2016   Allergic reaction 12/16/2016   Polycythemia 12/16/2016    Palliative Care Assessment & Plan   Recommendations/Plan: Mental status is improving but still not able to have a GOC conversation. Will try again tomorrow.   Code Status:    Code Status Orders  (From admission, onward)           Start     Ordered   02/23/22 2142  Full code  Continuous        02/23/22 2141           Code Status History     Date Active Date Inactive Code  Status Order ID Comments User Context   02/23/2022 1902 02/23/2022 2141 Full Code 2142  [id number], MD ED   01/13/2018 0354 01/15/2018 1908 Full Code 03/18/2018  [id number], MD Inpatient   12/16/2016 0533 12/16/2016 1853 Full Code 02/15/2017  [id number], MD ED      Thank you for allowing the Palliative Medicine Team to assist in the care of this patient.  Clydie Braun, NP  Please contact Palliative Medicine Team phone at [phone] for questions and concerns.

## 2022-03-13 NOTE — Progress Notes (Signed)
Patient presented to Radiology for NG tube placement. NG tube was inserted into the right nostril and patient immediately became combative/agitated. Multiple staff members attempted to restrain arms/legs. Patient repeatedly stated he did not want NG tube placed. Procedure cancelled. Patient's nurse was in the procedure room and she will notify the team of the unsuccessful attempt.  Alwyn Ren, [place] [phone] 03/13/2022, 4:03 PM

## 2022-03-13 NOTE — Progress Notes (Signed)
PT Cancellation Note  Patient Details Name: Bryan Mitchell MRN: [id number] DOB: [birth year]   Cancelled Treatment:    Reason Eval/Treat Not Completed: Medical issues which prohibited therapy;Patient at procedure or test/unavailable (Patient out of the room for a head CT due to concern for anisocoria. PT to follow up as appropriate)  Donna Bernard, PT, MPT  Ina Homes 03/13/2022, 11:09 AM

## 2022-03-13 NOTE — Progress Notes (Addendum)
Nutrition Follow Up Note   DOCUMENTATION CODES:   Morbid obesity  INTERVENTION:   RD will add supplements once pt's diet is advanced  If NGT placed, recommend:  Glucerna 1.5@70ml /hr- Initiate at 56m/hr and increase by 132mhr q 8 hours until goal rate is reached.   Free water flushes 10065m4 hours   Regimen provides 2520kcal/day, 139g/day protein and 1875m44my of free water  Pt at high refeed risk; recommend monitor potassium, magnesium and phosphorus labs daily until stable  NUTRITION DIAGNOSIS:   Inadequate oral intake related to inability to eat (pt sedated and ventilated) as evidenced by NPO status.  GOAL:   Patient will meet greater than or equal to 90% of their needs -previously met with tube feeds   MONITOR:   Diet advancement, Labs, Weight trends, Skin, I & O's  ASSESSMENT:   60 y68 male with h/o COPD, DM, HTN, HLD, CHF, polycythemia and alpha gal who is admitted with rhinovirus, PNA and AMS.  Pt continues to have AMS. CT head negative for acute changes. Pt seen by SLP yesterday and today and remains NPO. NGT unable to be placed by radiology today related to agitation. Per RN, will attempt again tomorrow. Pt remains at refeed risk. Hypernatremia improved. Pt remains on IVF. Per chart, pt is down 36lbs since admission. Pt -3.6L on his I & Os.   Medications reviewed and include: colace, lasix, insulin, protonix, miralax, 5% dextrose @75ml /hr, KPhos  Labs reviewed: Na 150(H), K 3.1(L), BUN 50(H), P 2.4(L), Mg 1.5(L) BNP- 123.4(H)- 9/4 Cbgs- 155, 165, 160 x 24 hrs  Diet Order:    Diet Order             Diet NPO time specified  Diet effective now                  EDUCATION NEEDS:   No education needs have been identified at this time  Skin:  Skin Assessment: Reviewed RN Assessment (ecchymosis)  Last BM:  9/5- type 7  Height:   Ht Readings from Last 1 Encounters:  02/23/22 5' 8"  (1.727 m)    Weight:   Wt Readings from Last 1 Encounters:   03/13/22 128.8 kg    Ideal Body Weight:  70 kg  BMI:  Body mass index is 43.17 kg/m.  Estimated Nutritional Needs:   Kcal:  2600-2900kcal/day  Protein:  >130g/day  Fluid:  2.1-2.4L/day  CaseKoleen Distance RD, LDN Please [facility] to [facility] and/or RD on-call/weekend/after hours pager

## 2022-03-13 NOTE — Progress Notes (Signed)
Speech Language Pathology Treatment: Dysphagia  Patient Details Name: Bryan Mitchell MRN: [id number] DOB: 1960-09-30 Today's Date: 03/13/2022 Time: 7253-6644 SLP Time Calculation (min) (ACUTE ONLY): 45 min  Assessment / Plan / Recommendation Clinical Impression  Pt seen for ongoing therapy today; trials to assess readiness to initiate an oral diet(safely). Pt awake, but inconsistent alertness and awareness w/ reduced intentional engagement w/ Therapist and during po trials. Mumbled/muttered speech. Poor follow through w/ tasks including po tasks; extremely weak Bilat UEs. MAX cues needed for follow through w/ all tasks. Somewhat fidgity in bed w/ R head turn/gaze preference noted. W/ tilted head to R it was difficult to determine if any R oral weakness but R oral cavity residue was noted consistently w/ puree trials as noted yesterday at BSE. Pt on HFNC O2 4-6L; 45%. Afebrile; WBC WNL.   Pt continues to present w/ oropharyngeal phase dysphagia in setting of declined Cognitive status; declined Mental Status. NO CHANGE OR IMPROVEMENT NOTED IN MENTAL STATUS FROM BSE YESTERDAY. Suspect impact from illness and lengthy intubation/sedation? Family reported pt having Confusion on day prior to admit. ANY Cognitive/Mental status decline can impact overall awareness/timing of swallow and safety during po tasks which increases risk for aspiration, choking.  Pt's risk for aspiration is deemed too HIGH at this time for any oral intake, including meds.         Pt consumed trials of ice chips, and purees w/ No immediate, overt clinical s/s of aspiration noted: no consistent decline in vocal quality(wet x1 again?); no immediate cough but throat clearing x1 again w/ ice chips during oral holding, and no decline in respiratory status during/post trials. O2 sats remained at his baseline of 98%.  HOWEVER, oral phase was c/b MOD-SEVERE deficits w/ poor bolus management(lacked cohesion and timely A-P transfer) and slow oral  clearing w/ intermittent bolus residue (puree) noted in R anterior oral region of teeth/tongue. No Left oral residue appreciated. Pt exhibited a MUNCHING pattern w/ bolus trials -- NOT effective for mastication of ice chips. Oral Holding noted x3-4 -- when pt looked around the room w/ mouth opened and bolus material laying orally. Tactile/verbal cues given to redirect attention to clearing mouth. Oral phase lacked awareness and follow through w/ po the trials. He required MAX support, guidance, and verbal/tactile/visual cues d/t the Cognitive decline. He was not able to feed himself which improves safety of swallowing.  OM revealed overall decreased oral tone.       In setting of continued Cognitive decline, illness, reduced awareness and follow through, and his risk for aspiration, recommend continued NPO status w/ therapeutic po trials w/ ST services. Recommend frequent oral care for hygiene and stimulation of swallowing. NSG made aware of concern for THRUSH to address w/ MD. MD/NSG updated. Agreed w/ MD and Dietician for need of NGT TFs to support nutrition at this time -- pt is not moving in the direction of being able to safely take an oral diet.   ST services will f/u daily w/ pt's status, po trials. Recommend follow w/ Palliative Care for ongoing GOC and education re: impact of Cognitive decline on swallowing and oral intake in general. Precautions posted in room.    HPI HPI: Pt is a 61 yo M presenting to [facility] ED on 02/23/22 from home for evaluation of worsening fatigue, poor appetite and SOB over the past 2 weeks. Per ED documentation, patient also complained of worsening bilateral leg swelling and pain. He started having increased congestion and poor PO intake  also. On the night of 8/18 the sister spoke with the patient on the phone. Pt  told her, "I feel like I'm dying". On 8/19, she convinced the patient to go to the hospital to be evaluated. She reports he does not trust doctors and has difficulty  following outpatient medication regimens. He was prescribed home O2, and has oxygen tanks at the house but has not used them in 2 years per his sister. He also injured his RIGHT knee a few weeks ago and had been getting around with a crutch. Sister reported he had been a little more confused on 8/19.  At admit to ICU, pt had worsening acute on chronic hypoxic/hypercapnic respiratory failure s/t suspected COPD exacerbation in the setting of HFpEF and possible pulmonary hypertension requiring emergent intubation and mechanical ventilatory support.  Pt extubated on 03/11/2022; placed on BiPAP then now on [place] O2 support.   CXR: Cardiomegaly with unchanged diffuse bilateral interstitial  pulmonary opacity and small layering pleural effusions, findings  likely edema.  MRI: No acute intracranial abnormality.  2. Chronic microvascular ischemic disease for age.      SLP Plan  Continue with current plan of care      Recommendations for follow up therapy are one component of a multi-disciplinary discharge planning process, led by the attending physician.  Recommendations may be updated based on patient status, additional functional criteria and insurance authorization.    Recommendations  Diet recommendations: NPO (therapeutic ice chips; NGT to be placed) Medication Administration: Via alternative means Supervision: Full supervision/cueing for compensatory strategies (w/ ice chips) Compensations: Minimize environmental distractions;Slow rate;Small sips/bites Postural Changes and/or Swallow Maneuvers: Seated upright 90 degrees;Upright 30-60 min after meal                General recommendations:  (Dietician f/u; Palliative Care f/u) Oral Care Recommendations: Oral care BID;Oral care prior to ice chip/H20;Staff/trained caregiver to provide oral care Follow Up Recommendations: Skilled nursing-short term rehab (<3 hours/day) (TBD) Assistance recommended at discharge: Frequent or constant  Supervision/Assistance SLP Visit Diagnosis: Dysphagia, oropharyngeal phase (R13.12) (Cognitive decline) Plan: Continue with current plan of care             Jerilynn Som, MS, CCC-SLP Speech Language Pathologist Rehab Services; [facility] [place] [phone] (ascom) Bryan Mitchell  03/13/2022, 1:12 PM

## 2022-03-13 NOTE — Progress Notes (Signed)
NAME:  Bryan Mitchell, MRN:  [id number], DOB:  [birth year], LOS: 22 ADMISSION DATE:  02/23/2022  History of Present Illness:  61 yo M presenting to [facility] ED on 02/23/22 from home in a wheelchair with his sister for evaluation of worsening fatigue, poor appetite and SOB over the past 2 weeks. Per ED documentation, patient also complained of worsening bilateral leg swelling and pain.  Sister interviewed over the telephone providing supplemental history. She described that he lives alone, and began not feeling good for about 2 weeks with complaints of dyspnea and fatigue. He self-treated with Mucinex and took a COVID test which was negative. He started having increased congestion and poor PO intake. On the night of 8/18 the sister spoke with the patient on the phone and he told her, "I feel like I'm dying". Today on 8/19 she convinced the patient to go to the hospital to be evaluated. She reports he does not trust doctors and has difficulty following outpatient medication regimens. He was prescribed home O2, and has oxygen tanks at the house but has not used them in 2 years per his sister. He also injured his RIGHT knee a few weeks ago and had been getting around with a crutch. She reported he had been a little more confused on 8/19.   Per ED documentation, he endorsed a productive cough, but denied fever/chills. Patient admitted to not taking his Lasix when interviewed by [facility] service, per their documentation. And he has noticed increased pedal edema recently.   ED course: Upon arrival in triage the patient was profoundly hypoxic with his SpO2 was in the 60's on RA, this initially recovered to 99% on 3 L [place]. ABG revealed partially compensated hypercapnia and he was placed on BIPAP. He received Lasix for diuresis, BNP was mildly elevated. CT angio negative for pulmonary edema/effusions, negative for PE. [facility] service consulted for admission. After a couple of hours on BIPAP, patient became combative and agitated,  ripping off the BIPAP mask. Patient emergently intubated in ED. PCCM consulted. Medications given: IV contrast, Duo-nebs, lasix, fondaparinux, doxycycline, fentanyl, versed, ketamine, rocuronium & succinylcholine for intubation, solumedrol, propofol drip started  PCCM consulted due to worsening acute on chronic hypoxic/hypercapnic respiratory failure requiring emergent intubation and mechanical ventilatory support.  Pertinent  Medical History  T2DM COPD Morbid obesity Tobacco abuse (cigars, 40 pack year history) Secondary Polycythemia HFpEF  Significant Hospital Events: Including procedures, antibiotic start and stop dates in addition to other pertinent events   02/23/22: Admit to ICU with worsening acute on chronic hypoxic/hypercapnic respiratory failure s/t suspected COPD exacerbation in the setting of HFpEF and possible pulmonary hypertension requiring emergent intubation and mechanical ventilatory support. 02/24/22: Pt remains mechanically intubated settings decreased: PEEP 5/FiO2 40%.   02/25/22: Increased vent requirements this morning due to diffuse pulmonary vascular congestion, received lasix. Tracheal aspirate with gram + cocci & rods, gram - rods.  Severe Delirium upon WUA. 02/26/22: Remains on vent, 60% fio2 & 8 PEEP.  Diurese with Diamox + Metolazone. 02/27/22: No acute events overnight; remains mechanically intubated current vent settings: 40%/8 PEEP  02/28/22: Pts O2 requirements increased overnight due to hypoxia vent settings: FiO2 50%/PEEP 8 unable to perform SBT  03/01/22: Overnight with hypoxia, concern for ? Mucus plug.  Vent settings this am 100% FiO2 & 8 PEEP.  Plan for Diuresis, considering Bronchoscopy 03/02/22: Continued high vent settings (100%, 12 PEEP), start recruitment maneuvers, considering bronch.  Diurese. 03/03/22: Continued high vent settings (100% FiO2, 12 PEEP). Diuresing.  Obtain  CT Angiogram Chest 03/04/22: Pt currently proned and tolerating.  Remains on high  ventilator settings: FiO2 80%/PEEP 15/PC O2 sats 99% 03/05/22: Pt remains mechanically intubated vent settings currently FiO2 55%/PEEP 12.  Palliative Care working with family regarding goals of care  03/06/22: MRI Brain negative.  03/07/22: Remains intubated, vent settings 60% FiO2, 10 PEEP.  Consult ENT for Tracheostomy.  Low grade temperature (T max 99.8), reculture tracheal aspirate.  Diurese with Diamox + Metolazone 03/08/22: Low grade fever (T max 100.7), repeat TA with rare gram + rods, but PCT normal and no Leukocytosis.  Check Venous US to r/o DVT as potential cause of fever, along with UA.  Low threshold to start ABX.  Vent requirements decreased to 40% FiO2 & 10 PEEP. Creatinine remains stable, diurese with Diamox + Metolazone. 03/09/2022: Remains on the ventilator, FiO2 at 40%.  Continues to require 10 of PEEP. Discontinued diuretics. 03/10/2022: Foley placed due to urinary retention 03/11/2022: extubated, follows commands. Weak cough. 03/13/2022: tolerating nocturnal BiPAP  Interim History / Subjective:  Extubated to BiPAP, tolerating it nocturnally and alternating with nasal cannula.  Objective   Blood pressure (!) 150/86, pulse 87, temperature 98 F (36.7 C), temperature source Axillary, resp. rate (!) 26, height _0  (1.727 m), weight 128.8 kg, SpO2 94 %.    FiO2 (%):  [36 %-70 %] 70 %   Intake/Output Summary (Last 24 hours) at 03/13/2022 0800 Last data filed at 03/13/2022 0400 Gross per 24 hour  Intake 1589.2 ml  Output 1915 ml  Net -325.8 ml   Filed Weights   03/12/22 0408 03/12/22 1700 03/13/22 0330  Weight: 128.8 kg 135.3 kg 128.8 kg    Examination: Physical Exam Constitutional:      General: He is not in acute distress.    Appearance: He is obese. He is ill-appearing and toxic-appearing.  HENT:     Head: Normocephalic.  Eyes:     Comments: Anisocoria, left pupil dilated > right. Both reactive to light  Cardiovascular:     Rate and Rhythm: Normal rate and regular rhythm.      Heart sounds: Normal heart sounds.  Pulmonary:     Breath sounds: Rhonchi and rales present. No wheezing.  Abdominal:     General: There is distension.     Palpations: Abdomen is soft.  Skin:    Findings: No rash.  Neurological:     Mental Status: He is disoriented.      Resolved Hospital Problem list     Assessment & Plan:  61 year old male presented to the hospital with AMS and respiratory failure requiring mechanical support due to COPD exacerbation and HFpEF decompensation in the setting of rhinovirus infection.  #Acute on Chronic Hypoxic and Hypercapnic Respiratory Failure #COPD exacerbation #decompensated HFpEF #Rhinovirus infection - resovled #Tobacco Abuse PMHx: COPD, morbid obesity, tobacco abuse, HFpEF   CTA Chest 08/27: new bilateral lower lobe collapse or consolidation; negative for central PE  - Extubated 9/4, tolerating nocturnal BiPAP - Blood gases show compensated respiratory acidosis. Started nocturnal BiPAP that will need to be continued on discharge. Patient will also benefit from sleep study, was offered this in the past. - Follow intermittent Chest X-ray & ABG as needed - Bronchodilators - IV steroids discontinued, course completed - Completed course of ABx - Repeat tracheal aspirate results: noncontributory - Diuresis as BP and renal function permits   #Acute on Chronic HFpEF exacerbation Mildly elevated Troponin, due to demand ischemia - RESOLVED Pulmonary HTN Echocardiogram 02/24/22: LVEF 60-65%,  Grade II DD, Pulm HTN - Continuous  telemetry monitoring - Maintain MAP >65 - Troponins have stabilized - held diuretics due to acute kidney injury - restart diuretics today, furosemide 40 mg daily > goal -1 liter   Acute kidney injury - resolved Hypernatremia  Suspect due to overzealous diuresis. Now on D5W. Will continue to monitor but he is in need of spot diuresis.  Foley catheter monitor I&O    Polycythemia suspected secondary to chronic  hypoxia ~improving  Followed by oncology outpatient, last seen 10/2019. He was offered a sleep study which he declined. At that time, he was deemed not a candidate for continued follow up as Hgb had stabilized.  - Daily CBC   Type 2 Diabetes Mellitus Hemoglobin A1C: 6.5 - Monitor CBG Q 4 hours - basal bolus regimen - Target range while in ICU: 213-086   Acute Metabolic Encephalopathy Anisocoria CT Head negative 8/19 Follow up MRI Brain negative 8/30 - Treatment of metabolic derangements as above - Repeat CT head today   Best Practice (right click and "Reselect all SmartList Selections" daily)   Diet/type: NPO DVT prophylaxis: LMWH GI prophylaxis: N/A Lines: yes and it is still needed Foley:  Yes, and it is no longer needed Code Status:  full code Last date of multidisciplinary goals of care discussion [03/13/2022]  Labs   CBC: Recent Labs  Lab 03/07/22 0442 03/08/22 0412 03/09/22 0500 03/10/22 0422 03/11/22 0542 03/12/22 0433 03/13/22 0411  WBC 6.4   < > 6.4 7.3 7.2 8.4 8.7  NEUTROABS 4.3  --   --   --   --   --   --   HGB 17.0   < > 16.0 17.3* 17.0 17.7* 17.3*  HCT 56.1*   < > 54.9* 59.1* 58.5* 61.8* 59.7*  MCV 93.0   < > 96.3 96.6 97.7 96.3 95.7  PLT 217   < > 204 222 200 246 278   < > = values in this interval not displayed.    Basic Metabolic Panel: Recent Labs  Lab 03/09/22 0500 03/10/22 0422 03/11/22 0542 03/12/22 0433 03/13/22 0411  NA 147* 152* 152* 150* 149*  K 4.6 3.8 3.6 3.1* 3.3*  CL 113* 114* 110 110 109  CO2 27 33* 33* 33* 34*  GLUCOSE 163* 174* 207* 149* 129*  BUN 90* 88* 85* 50* 38*  CREATININE 1.49* 1.52* 1.10 0.84 0.67  CALCIUM 9.1 9.9 10.0 9.9 9.7  MG 2.8* 2.6* 2.1 1.5* 1.6*  PHOS 5.5* 4.5 3.9 2.4* 3.6   GFR: Estimated Creatinine Clearance: 130.2 mL/min (by C-G formula based on SCr of 0.67 mg/dL). Recent Labs  Lab 03/07/22 0442 03/08/22 0412 03/09/22 0500 03/10/22 0422 03/11/22 0542 03/12/22 0433 03/13/22 0411   PROCALCITON 0.10 0.14  --   --   --   --   --   WBC 6.4 6.3   < > 7.3 7.2 8.4 8.7   < > = values in this interval not displayed.    Liver Function Tests: No results for input(s): "AST", "ALT", "ALKPHOS", "BILITOT", "PROT", "ALBUMIN" in the last 168 hours. No results for input(s): "LIPASE", "AMYLASE" in the last 168 hours. No results for input(s): "AMMONIA" in the last 168 hours.  ABG    Component Value Date/Time   PHART 7.47 (H) 03/08/2022 0522   PCO2ART 49 (H) 03/08/2022 0522   PO2ART 80 (L) 03/08/2022 0522   HCO3 35.7 (H) 03/08/2022 0522   TCO2 39 (H) 01/13/2018 0006   O2SAT 97.6 03/08/2022  0522     Coagulation Profile: No results for input(s): "INR", "PROTIME" in the last 168 hours.  Cardiac Enzymes: No results for input(s): "CKTOTAL", "CKMB", "CKMBINDEX", "TROPONINI" in the last 168 hours.  HbA1C: Hemoglobin A1C  Date/Time Value Ref Range Status  02/13/2018 01:52 PM 7.5 (A) 4.0 - 5.6 % Final   Hgb A1c MFr Bld  Date/Time Value Ref Range Status  02/23/2022 10:05 PM 6.5 (H) 4.8 - 5.6 % Final    Comment:    (NOTE) Pre diabetes:          5.7%-6.4%  Diabetes:              >6.4%  Glycemic control for   <7.0% adults with diabetes   01/13/2018 09:53 AM 7.3 (H) 4.8 - 5.6 % Final    Comment:    (NOTE) Pre diabetes:          5.7%-6.4% Diabetes:              >6.4% Glycemic control for   <7.0% adults with diabetes     CBG: Recent Labs  Lab 03/12/22 1651 03/12/22 1918 03/12/22 2305 03/13/22 0330 03/13/22 0751  GLUCAP 162* 132* 134* 118* 141*    Review of Systems:   Unable to obtain  Past Medical History:  He,  has a past medical history of (HFpEF) heart failure with preserved ejection fraction ([place]), Allergic reaction (12/15/2016), COPD (chronic obstructive pulmonary disease) ([place]), Hyperglycemia (12/2016), Hypertension, and Polycythemia.   Surgical History:   Past Surgical History:  Procedure Laterality Date   KNEE SURGERY Right      Social  History:   reports that he has been smoking cigars. He has never used smokeless tobacco. He reports current alcohol use. He reports that he does not use drugs.   Family History:  His family history includes CAD in his brother and father.   Allergies Allergies  Allergen Reactions   Other Anaphylaxis    McCormick's Rub (Food)   Beef-Derived Products Itching    Alpha gal allergy   Pork-Derived Products Hives    Alpha gal     Home Medications  Prior to Admission medications   Medication Sig Start Date End Date Taking? Authorizing Provider  albuterol (PROVENTIL HFA;VENTOLIN HFA) 108 (90 Base) MCG/ACT inhaler Inhale 2 puffs into the lungs every 6 (six) hours as needed for wheezing or shortness of breath. Patient not taking: Reported on 02/23/2022 02/03/18   Azzie Glatter, FNP  aspirin 81 MG tablet Take 81 mg by mouth daily.    [provider]  blood glucose meter kit and supplies Dispense based on patient and insurance preference. Use up to four times daily as directed. (FOR ICD-10 E10.9, E11.9). 01/15/18   Eugenie Filler, MD  buPROPion (WELLBUTRIN SR) 150 MG 12 hr tablet Take 150 mg by mouth 2 (two) times daily. Patient not taking: Reported on 02/23/2022    [provider]  diphenhydrAMINE (BENADRYL) 25 MG tablet Take 1 tablet (25 mg total) by mouth every 6 (six) hours. Patient not taking: Reported on 02/23/2022 09/07/17   Jola Schmidt, MD  fluticasone [employer]) 50 MCG/ACT nasal spray Place 2 sprays into both nostrils daily. Patient not taking: Reported on 02/23/2022 01/16/18   Eugenie Filler, MD  folic acid (FOLVITE) 1 MG tablet Take 1 tablet (1 mg total) by mouth daily. Patient not taking: Reported on 02/03/2018 01/16/18   Eugenie Filler, MD  furosemide (LASIX) 40 MG tablet Take 1 tablet (40 mg total)  by mouth daily. Patient not taking: Reported on 02/23/2022 02/03/18   Azzie Glatter, FNP  meloxicam (MOBIC) 15 MG tablet Take 15 mg by mouth daily. Patient not  taking: Reported on 02/23/2022    [provider]  phentermine 15 MG capsule Take 15 mg by mouth every morning. Patient not taking: Reported on 02/23/2022    [provider]  traMADol (ULTRAM) 50 MG tablet Take 1 tablet (50 mg total) by mouth every 8 (eight) hours as needed. Patient not taking: Reported on 02/23/2022 02/13/18   Azzie Glatter, FNP  umeclidinium bromide (INCRUSE ELLIPTA) 62.5 MCG/INH AEPB Inhale 1 puff into the lungs daily. Patient not taking: Reported on 07/28/2019 02/11/18   Azzie Glatter, FNP     Critical care time: 42 minutes

## 2022-03-13 NOTE — Consult Note (Signed)
PHARMACY CONSULT NOTE  Pharmacy Consult for Electrolyte Monitoring and Replacement   Recent Labs: Potassium (mmol/L)  Date Value  03/13/2022 3.3 (L)  04/21/2012 4.8   Magnesium (mg/dL)  Date Value  27/78/2423 1.6 (L)   Calcium (mg/dL)  Date Value  53/61/4431 9.7   Calcium, Total (mg/dL)  Date Value  54/00/8676 8.8   Albumin (g/dL)  Date Value  19/50/9326 4.1  04/21/2012 4.0   Phosphorus (mg/dL)  Date Value  71/24/5809 3.6   Sodium (mmol/L)  Date Value  03/13/2022 149 (H)  04/21/2012 139   Assessment: Pharmacy has been consulted to monitor electrolytes in 60yo male presenting to the ED complaining of worsening bilateral leg swelling and pain. He was self-treating with mucinex and took a COVID test which was negative. He is currently intubated and sedated in CCU. Pharmacy is asked to follow and replace electrolytes while in CCU  Nutrition: Tube feeds @70  ml/h>OFF; NPO  MIVF: D5W at 53mL/hr Medications: +lasix 40mg  IV QD (9/06>>  Goal of Therapy:  Electrolytes within normal limits   Plan:  UOP 2.3>1.5>0.8> (added on lasix 40mg  IV QD)>__; Scr 1.1>0.84>0.67  Na 150>149: D5W continues at 65ml/h K 3.1>3.3: Replete with Kcl q1h x6 doses per CCM team. Mg 1.5>1.6: Replete with MgSO 4g IV x1 dose per CCM team. Phos 2.4>3.6: WNL, no further repletion today. Follow-up electrolytes with AM labs tomorrow  03/13/2022 12:32 PM

## 2022-03-14 ENCOUNTER — Encounter
Admission: EM | Disposition: A | Discharge status: Skilled Nursing Facility | Payer: Self-pay | Source: Home / Self Care | Attending: Internal Medicine

## 2022-03-14 LAB — GLUCOSE, CAPILLARY
Glucose-Capillary: 121 mg/dL — ABNORMAL HIGH (ref 70–99)
Glucose-Capillary: 98 mg/dL (ref 70–99)
Glucose-Capillary: 140 mg/dL — ABNORMAL HIGH (ref 70–99)
Glucose-Capillary: 152 mg/dL — ABNORMAL HIGH (ref 70–99)
Glucose-Capillary: 205 mg/dL — ABNORMAL HIGH (ref 70–99)
Glucose-Capillary: 190 mg/dL — ABNORMAL HIGH (ref 70–99)

## 2022-03-14 LAB — PHOSPHORUS: Phosphorus: 3.5 mg/dL (ref 2.5–4.6)

## 2022-03-14 LAB — BASIC METABOLIC PANEL
Anion gap: 5 (ref 5–15)
BUN: 33 mg/dL — ABNORMAL HIGH (ref 6–20)
CO2: 32 mmol/L (ref 22–32)
Calcium: 9.6 mg/dL (ref 8.9–10.3)
Chloride: 109 mmol/L (ref 98–111)
Creatinine, Ser: 0.71 mg/dL (ref 0.61–1.24)
GFR, Estimated: >60 mL/min (ref >60)
Glucose, Bld: 124 mg/dL — ABNORMAL HIGH (ref 70–99)
Potassium: 3.5 mmol/L (ref 3.5–5.1)
Sodium: 146 mmol/L — ABNORMAL HIGH (ref 135–145)

## 2022-03-14 LAB — MAGNESIUM: Magnesium: 1.9 mg/dL (ref 1.7–2.4)

## 2022-03-14 SURGERY — CREATION, TRACHEOSTOMY
Anesthesia: Choice

## 2022-03-14 MED ORDER — MAGNESIUM SULFATE 2 GM/50ML IV SOLN
2.0000 g | Freq: Once | INTRAVENOUS | Status: AC
  Administered 2022-03-14: 2 g via INTRAVENOUS
  Filled 2022-03-14: qty 50

## 2022-03-14 MED ORDER — ADULT MULTIVITAMIN W/MINERALS CH
1.0000 | ORAL_TABLET | Freq: Every day | ORAL | Status: DC
  Administered 2022-03-15 – 2022-04-12 (×28): 1 via ORAL
  Filled 2022-03-14 (×29): qty 1

## 2022-03-14 MED ORDER — TAMSULOSIN HCL 0.4 MG PO CAPS
0.4000 mg | ORAL_CAPSULE | Freq: Every day | ORAL | Status: DC
  Administered 2022-03-14 – 2022-04-12 (×30): 0.4 mg via ORAL
  Filled 2022-03-14 (×30): qty 1

## 2022-03-14 MED ORDER — ENSURE ENLIVE PO LIQD
237.0000 mL | Freq: Three times a day (TID) | ORAL | Status: DC
  Administered 2022-03-14 – 2022-04-12 (×64): 237 mL via ORAL

## 2022-03-14 MED ORDER — NYSTATIN 100000 UNIT/ML MT SUSP
5.0000 mL | Freq: Four times a day (QID) | OROMUCOSAL | Status: DC
  Administered 2022-03-14 – 2022-03-26 (×42): 500000 [IU] via ORAL
  Filled 2022-03-14 (×46): qty 5

## 2022-03-14 MED ORDER — POTASSIUM CHLORIDE 10 MEQ/100ML IV SOLN
10.0000 meq | INTRAVENOUS | Status: DC
  Administered 2022-03-14 (×4): 10 meq via INTRAVENOUS
  Filled 2022-03-14 (×4): qty 100

## 2022-03-14 NOTE — Progress Notes (Signed)
Occupational Therapy Treatment Patient Details Name: Bryan Mitchell MRN: [id number] DOB: 07-Jun-1961 Today's Date: 03/14/2022   History of present illness 61 yo M presenting to [facility] ED on 02/23/22 from home for evaluation of worsening fatigue, poor appetite and SOB over the past 2 weeks. Per ED documentation, patient also complained of worsening bilateral leg swelling and pain. 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 He was prescribed home O2, and has oxygen tanks at the house but has not used them in 2 years per his sister.   OT comments  Bryan Mitchell was seen for OT treatment on this date. Upon arrival to room PT at bedside, pt agreeable to tx. Pt requires TOTAL A x2 for all bed mobility, tolerates ~3 min static sitting with no protective righting reactions noted. Improved RUE strength and AROM however continues to require AAROM for BUE exercises as described below. MAX A doff B socks bed in chair position. Pt making progress toward goals, will continue to follow POC. Discharge recommendation remains appropriate.     Recommendations for follow up therapy are one component of a multi-disciplinary discharge planning process, led by the attending physician.  Recommendations may be updated based on patient status, additional functional criteria and insurance authorization.    Follow Up Recommendations  Skilled nursing-short term rehab (<3 hours/day)    Assistance Recommended at Discharge Frequent or constant Supervision/Assistance  Patient can return home with the following  Two people to help with walking and/or transfers;Two people to help with bathing/dressing/bathroom   Equipment Recommendations  Hospital bed    Recommendations for Other Services      Precautions / Restrictions Precautions Precautions: Fall Restrictions Weight Bearing Restrictions: No       Mobility Bed Mobility Overal bed mobility: Needs Assistance Bed  Mobility: Supine to Sit, Sit to Supine     Supine to sit: Total assist, +2 for physical assistance, HOB elevated Sit to supine: Total assist, +2 for physical assistance        Transfers                   General transfer comment: unsafe to attempt     Balance Overall balance assessment: Needs assistance Sitting-balance support: Feet unsupported, Bilateral upper extremity supported Sitting balance-Leahy Scale: Zero Sitting balance - Comments: no protective righting responses noted                                   ADL either performed or assessed with clinical judgement   ADL Overall ADL's : Needs assistance/impaired                                       General ADL Comments: MAX A don/doff socks at bed in chair position.      Cognition Arousal/Alertness: Awake/alert Behavior During Therapy: WFL for tasks assessed/performed Overall Cognitive Status: No family/caregiver present to determine baseline cognitive functioning Area of Impairment: Following commands, Safety/judgement, Attention                       Following Commands: Follows one step commands inconsistently, Follows one step commands with increased time Safety/Judgement: Decreased awareness of safety, Decreased awareness of deficits              Exercises Exercises: General Upper  Extremity General Exercises - Upper Extremity Shoulder Flexion: AAROM, Strengthening, Both, 5 reps, Supine Shoulder Extension: AAROM, Strengthening, Both, 5 reps, Supine Shoulder ABduction: AAROM, Strengthening, Both, 5 reps, Supine Shoulder ADduction: AAROM, Strengthening, Both, 5 reps, Supine Shoulder Horizontal ABduction: AAROM, Strengthening, Both, 5 reps, Supine Shoulder Horizontal ADduction: AAROM, Strengthening, Both, 5 reps, Supine Elbow Flexion: AAROM, Strengthening, Both, 5 reps, Supine Elbow Extension: AAROM, Strengthening, Both, 5 reps, Supine Wrist Flexion: AAROM,  Strengthening, Both, 5 reps, Supine Wrist Extension: AAROM, Strengthening, Both, 5 reps, Supine    Shoulder Instructions       General Comments VSS on 45L HFNC    Pertinent Vitals/ Pain       Pain Assessment Pain Assessment: Faces Faces Pain Scale: Hurts even more Pain Location: Consult received and chart reviewed.  Per discussion with palliative care team, patient/family opting for hospice care/services. No acute PT/OT needs at this time.  Will complete orders at this time; please reconsult should needs change Pain Descriptors / Indicators: Discomfort, Grimacing Pain Intervention(s): Limited activity within patient's tolerance, Repositioned   Frequency  Min 2X/week        Progress Toward Goals  OT Goals(current goals can now be found in the care plan section)  Progress towards OT goals: Progressing toward goals  Acute Rehab OT Goals Patient Stated Goal: to eat OT Goal Formulation: With patient Time For Goal Achievement: 03/26/22 Potential to Achieve Goals: Fair ADL Goals Pt Will Perform Grooming: with set-up;with supervision;bed level Pt Will Perform Upper Body Dressing: with mod assist Pt Will Transfer to Toilet: with min assist;with +2 assist Pt/caregiver will Perform Home Exercise Program: Increased ROM;Increased strength;Both right and left upper extremity  Plan Discharge plan remains appropriate;Frequency remains appropriate    Co-evaluation    PT/OT/SLP Co-Evaluation/Treatment: Yes Reason for Co-Treatment: Complexity of the patient's impairments (multi-system involvement);For patient/therapist safety;To address functional/ADL transfers PT goals addressed during session: Mobility/safety with mobility OT goals addressed during session: ADL's and self-care      AM-PAC OT "6 Clicks" Daily Activity     Outcome Measure   Help from another person eating meals?: A Lot Help from another person taking care of personal grooming?: A Lot Help from another person  toileting, which includes using toliet, bedpan, or urinal?: Total Help from another person bathing (including washing, rinsing, drying)?: Total Help from another person to put on and taking off regular upper body clothing?: A Lot Help from another person to put on and taking off regular lower body clothing?: A Lot 6 Click Score: 10    End of Session    OT Visit Diagnosis: Other abnormalities of gait and mobility (R26.89);Muscle weakness (generalized) (M62.81)   Activity Tolerance Patient tolerated treatment well;Patient limited by fatigue   Patient Left in bed;with call bell/phone within reach   Nurse Communication          Time: 4481-8563 OT Time Calculation (min): 15 min  Charges: OT General Charges $OT Visit: 1 Visit OT Treatments $Therapeutic Exercise: 8-22 mins  Kathie Dike, M.S. OTR/L  03/14/22, 10:02 AM  ascom [phone]

## 2022-03-14 NOTE — Progress Notes (Signed)
Nutrition Follow Up Note   DOCUMENTATION CODES:   Morbid obesity  INTERVENTION:   Ensure Enlive po TID, each supplement provides 350 kcal and 20 grams of protein.  Vital Cuisine TID, each supplement provides 520kcal and 22g of protein.   MVI po daily   Pt at high refeed risk; recommend monitor potassium, magnesium and phosphorus labs daily until stable  NUTRITION DIAGNOSIS:   Inadequate oral intake related to inability to eat (pt sedated and ventilated) as evidenced by NPO status.  GOAL:   Patient will meet greater than or equal to 90% of their needs -not met   MONITOR:   PO intake, Supplement acceptance, Labs, Weight trends, Skin, I & O's  ASSESSMENT:   61 y/o male with h/o COPD, DM, HTN, HLD, CHF, polycythemia and alpha gal who is admitted with rhinovirus, PNA and AMS.  Pt seen again by SLP today and was able to be placed on a pureed diet. Per pt's sister at bedside, pt loves chocolate milk. SLP reports that pt drank Ensure during his evaluation. RD will add supplements and MVI to help pt meet his estimated needs. Pt remains at high refeed risk. Recommend NGT and nutrition support if patient with poor oral intake. Hypernatremia improved. Refeed labs stable. Per chart, pt down ~40lbs since admission. Pt -5.4L on his I & Os. RD unsure of pt's UBW as there is no recent weight history in chart.   Medications reviewed and include: colace, lasix, insulin, miralax, 5% dextrose _0 /hr, KCl  Labs reviewed: Na 146(H), K 3.5 wnl, BUN 33(H), P 3.5 wnl, Mg 1.9 wnl Cbgs- 140, 98, 121 x 24 hrs  Diet Order:    Diet Order             DIET - DYS 1 Room service appropriate? No; Fluid consistency: Thin  Diet effective now                  EDUCATION NEEDS:   No education needs have been identified at this time  Skin:  Skin Assessment: Reviewed RN Assessment (ecchymosis)  Last BM:  9/7- type 7  Height:   Ht Readings from Last 1 Encounters:  02/23/22 5' 8" (1.727 m)     Weight:   Wt Readings from Last 1 Encounters:  03/14/22 127 kg    Ideal Body Weight:  70 kg  BMI:  Body mass index is 42.57 kg/m.  Estimated Nutritional Needs:   Kcal:  2600-2900kcal/day  Protein:  >130g/day  Fluid:  2.1-2.4L/day  Koleen Distance MS, RD, LDN Please refer to [facility] for RD and/or RD on-call/weekend/after hours pager

## 2022-03-14 NOTE — Progress Notes (Addendum)
Daily Progress Note   Patient Name: Bryan Mitchell       Date: 03/14/2022 DOB: 03/17/1961  Age: 61 y.o. MRN#: [id number] Attending Physician: Enedina Finner, MD Primary Care Physician: Pcp, No Admit Date: 02/23/2022  Reason for Consultation/Follow-up: Establishing goals of care  Subjective: Notes and labs reviewed.  Spoke with attending.  Previous conversation with patient's sister while patient was on the ventilator, where she indicated she would want full code/ full scope treatment including tracheostomy if necessary.  Patient has been able to tolerate extubation, and remains on high flow cannula.   PMT has been following in hopes that patient would come to a place of being able to verbalize his wishes on care moving forward.  His mental status is improving, however he has remained too confused for a definitive goals of care conversation.  Today he states initially that he would not ever want to be placed back on life support, or be placed back on a breathing machine.  When asked if he would want CPR, or attempts to restart his heart and breathing if they stopped, he indicates that he would want this.  He later stated that he would want to be placed back on life support if it were needed to keep him alive.  He was unable to speak to if he would want a tracheostomy.  He is unable to states he would want to be his surrogate decision make if he is unable to make decisions for himself.    Length of Stay: 19  Current Medications: Scheduled Meds:   budesonide (PULMICORT) nebulizer solution  0.25 mg Nebulization BID   Chlorhexidine Gluconate Cloth  6 each Topical Daily   docusate  100 mg Per Tube BID   feeding supplement  237 mL Oral TID BM   fondaparinux (ARIXTRA) injection  2.5 mg Subcutaneous Q24H    furosemide  40 mg Intravenous Daily   insulin aspart  0-20 Units Subcutaneous Q4H   insulin aspart  8 Units Subcutaneous Q4H   insulin glargine-yfgn  20 Units Subcutaneous QHS   ipratropium-albuterol  3 mL Nebulization Q6H   metoprolol tartrate  5 mg Intravenous Q8H   [START ON 03/15/2022] multivitamin with minerals  1 tablet Oral Daily   nystatin  5 mL Oral QID   ofloxacin  2 drop Both Eyes QID  mouth rinse  15 mL Mouth Rinse 4 times per day   polyethylene glycol  17 g Per Tube Daily   sodium chloride flush  10-40 mL Intracatheter Q12H    Continuous Infusions:  sodium chloride Stopped (02/23/22 2350)   dextrose 75 mL/hr at 03/14/22 0826   potassium chloride 10 mEq (03/14/22 1341)    PRN Meds: acetaminophen, acetaminophen, bisacodyl, ipratropium-albuterol, mouth rinse, sodium chloride flush  Physical Exam Pulmonary:     Effort: Pulmonary effort is normal.  Skin:    General: Skin is warm and dry.  Neurological:     Mental Status: He is alert.             Vital Signs: BP 122/77   Pulse 87   Temp 97.8 F (36.6 C) (Oral)   Resp 20   Ht 5\' 8"  (1.727 m)   Wt 127 kg   SpO2 93%   BMI 42.57 kg/m  SpO2: SpO2: 93 % O2 Device: O2 Device: High Flow Nasal Cannula O2 Flow Rate: O2 Flow Rate (L/min): 45 L/min  Intake/output summary:  Intake/Output Summary (Last 24 hours) at 03/14/2022 1342 Last data filed at 03/14/2022 05/14/2022 Gross per 24 hour  Intake 253.77 ml  Output 1700 ml  Net -1446.23 ml   LBM: Last BM Date : 03/14/22 Baseline Weight: Weight: (!) 145.2 kg Most recent weight: Weight: 127 kg    Patient Active Problem List   Diagnosis Date Noted   Toxic metabolic encephalopathy    Anisocoria    Pressure injury of skin 03/06/2022   Acute hypoxemic respiratory failure (HCC) 02/23/2022   Acute respiratory failure (HCC) 02/23/2022   COPD exacerbation (HCC)    Type 2 diabetes mellitus with complication, without long-term current use of insulin (HCC)    Edema of lower  extremity    Acute respiratory failure with hypoxia (HCC) 01/13/2018   Morbid obesity (HCC) 01/13/2018   Hypoxia 12/16/2016   Cellulitis and abscess of trunk 12/16/2016   Tobacco abuse 12/16/2016   Allergic reaction 12/16/2016   Polycythemia 12/16/2016    Palliative Care Assessment & Plan   Recommendations/Plan: PMT will continue to shadow for needs, and improvement in mental status such that patient would be able to have a definitive goals of care conversation himself. Family has stated that their wishes would be for full code/ full scope treatment for him.  Code Status:    Code Status Orders  (From admission, onward)           Start     Ordered   02/23/22 2142  Full code  Continuous        02/23/22 2141           Code Status History     Date Active Date Inactive Code Status Order ID Comments User Context   02/23/2022 1902 02/23/2022 2141 Full Code 2142  [id number], MD ED   01/13/2018 0354 01/15/2018 1908 Full Code 03/18/2018  [id number], MD Inpatient   12/16/2016 0533 12/16/2016 1853 Full Code 02/15/2017  [id number], MD ED       Prognosis:  Unable to determine   Thank you for allowing the Palliative Medicine Team to assist in the care of this patient.   Clydie Braun, NP  Please contact Palliative Medicine Team phone at [phone] for questions and concerns.

## 2022-03-14 NOTE — Progress Notes (Signed)
[facility]  - [place]   PATIENT NAME: Bryan Mitchell    MR#:  [id number]  DATE OF BIRTH:  1960/10/30  SUBJECTIVE:  no family at bedside. Patient is difficult to understand with his garbled speech. Was able to make some of the things he told me. Patient has been admitted with acute metabolic encephalopathy with respiratory distress placed on mechanical ventilator. Extubated alcohol high flow nasal cannula. Poor lung volumes. Morbidly obese likely has underlying sleep apnea. Worked with speech therapy today able to swallow. Started on PO nutrition.    VITALS:  Blood pressure 125/80, pulse 94, temperature 98.7 F (37.1 C), resp. rate 11, height 5\' 8"  (1.727 m), weight 127 kg, SpO2 94 %.  PHYSICAL EXAMINATION:   GENERAL:  61 y.o.-year-old patient lying in the bed with no acute distress. Morbid obesity LUNGS: decreased breath sounds bilaterally, no wheezing, rales, rhonchi. High flow nasal cannula oxygen CARDIOVASCULAR: S1, S2 normal. No murmurs, rubs, or gallops.  ABDOMEN: Soft, nontender, nondistended. Bowel sounds present. Foley present EXTREMITIES: ++edema NEUROLOGIC: nonfocal  patient is alert and awake SKIN: No obvious rash, lesion, or ulcer.   LABORATORY PANEL:  CBC Recent Labs  Lab 03/13/22 0411  WBC 8.7  HGB 17.3*  HCT 59.7*  PLT 278    Chemistries  Recent Labs  Lab 03/14/22 0346  NA 146*  K 3.5  CL 109  CO2 32  GLUCOSE 124*  BUN 33*  CREATININE 0.71  CALCIUM 9.6  MG 1.9   Cardiac Enzymes No results for input(s): "TROPONINI" in the last 168 hours. RADIOLOGY:  DG Naso G Tube Plc W/Fl W/Rad  Result Date: 03/14/2022 CLINICAL DATA:  Patient with dysphagia in the setting of impaired cognition. Request for NG tube. EXAM: NASO G TUBE PLACEMENT WITH FL AND WITH RAD CONTRAST:  None FLUOROSCOPY: Radiation Exposure Index (if provided by the fluoroscopic device): 0.70 mGy kerma COMPARISON:  None Available. FINDINGS: NG tube attempted  to be passed through the right nostril. Patient immediately became combative and uncooperative. Multiple staff members attempted to restrain his arms and legs. Patient repeatedly stated he did not want NG tube placement. Procedure stopped. IMPRESSION: Unsuccessful NG tube attempt due to patient agitation and patient request for procedure to stop. The patient's nurse was in the procedure room and she will notify the ordering MD. Electronically Signed   By: 05/14/2022 M.D.   On: 03/14/2022 07:12   CT HEAD WO CONTRAST (05/14/2022)  Result Date: 03/13/2022 CLINICAL DATA:  Anisicoria EXAM: CT HEAD WITHOUT CONTRAST TECHNIQUE: Contiguous axial images were obtained from the base of the skull through the vertex without intravenous contrast. RADIATION DOSE REDUCTION: This exam was performed according to the departmental dose-optimization program which includes automated exposure control, adjustment of the mA and/or kV according to patient size and/or use of iterative reconstruction technique. COMPARISON:  02/23/2022 FINDINGS: Brain: No evidence of acute infarction, hemorrhage, hydrocephalus, extra-axial collection or mass lesion/mass effect. Periventricular white matter low attenuation as can be seen with microvascular disease. Mild generalized Vascular: No hyperdense vessel or unexpected calcification. Skull: No osseous abnormality. Sinuses/Orbits: Mild right maxillary sinus mucosal thickening. Visualized mastoid sinuses are clear. Visualized orbits demonstrate no focal abnormality. Other: None IMPRESSION: 1. No acute intracranial pathology. 2. Chronic microvascular disease and mild generalized atrophy. Electronically Signed   By: 02/25/2022 M.D.   On: 03/13/2022 11:29    Assessment and Plan 61 yo M presenting to [facility] ED on 02/23/22 from home in a wheelchair with  his sister for evaluation of worsening fatigue, poor appetite and SOB over the past 2 weeks. Per ED documentation, patient also complained of worsening bilateral  leg swelling and pain.    CT angio negative for pulmonary edema/effusions, negative for PE.  Kindly review events from 19 August to set 7th in progress notes  Acute on chronic hypoxic and hypercapnia respiratory failure COPD exacerbation congestive heart failure diastolic rhinovirus infection-- resolved morbid obesity suspected sleep apnea tobacco abuse -- patient now extubated on high flow nasal cannula oxygen -wean as tolerated- --BiPAP as needed -- patient remains a high risk for re-intubation -- patient is full code -- PRN bronchodilators -- completed IV steroids, IV antibiotic -- diuresis as BP and renal function permits  Acute on chronic diastolic heart failure pulmonary hypertension -- Echo shows EF 60 to 65%, grade 2 diastolic dysfunction, pulmonary hypertension -- resume daily Lasix  Polycythemia secondary to chronic hypoxia -- able  Type II diabetes with morbid obesity -- hemoglobin A1c 6.5 -- continue Insulin Semglee and aspart  Generalized weakness, morbid obesity, wheelchair-bound -- will have physical therapy see patient -- TOC for discharge planning  Patient overall has a poor prognosis. Palliative care input appreciated. Patient remains a full code   Procedures: Family communication : none today Consults : pal care CODE STATUS: full DVT Prophylaxis :arixtra Level of care: Stepdown Status is: Inpatient Remains inpatient appropriate because: remains on high flow nasal cannula oxygen. Long road to recovery!!    TOTAL TIME TAKING CARE OF THIS PATIENT: 35 minutes.  >50% time spent on counselling and coordination of care  Note: This dictation was prepared with Dragon dictation along with smaller phrase technology. Any transcriptional errors that result from this process are unintentional.  Enedina Finner M.D    [facility]   CC: Primary care physician; Pcp, No

## 2022-03-14 NOTE — Progress Notes (Signed)
   03/14/22 1200  Clinical Encounter Type  Visited With Patient and family together  Visit Type Initial  Referral From Palliative care team  Consult/Referral To Chaplain  Spiritual Encounters  Spiritual Needs Prayer   Chaplain visited with patient's sister and had some verbal communication with patient. Patient responded to simple questions and aggressively held on to my hand for prayer. According to patients sister, patient has improved strength and mobility.

## 2022-03-14 NOTE — Evaluation (Signed)
Physical Therapy Evaluation Patient Details Name: Bryan Mitchell MRN: [id number] DOB: May 03, 1961 Today's Date: 03/14/2022  History of Present Illness  Patient is a 61 year old male with worsening acute on chronic hypoxic/hypercapnic respiratory failure requiring emergent intubation and mechanical ventilatory support. Suspected COPD exacerbation in the setting of HFpEF and possible pulmonary hypertension. Required intuatbion but is now extubated.  Clinical Impression  Patient is able to follow single step commands inconsistently. He is confused but cooperative with assessment. The patient requires extensive physical assistance with all mobility. +2 person assistance required for bed mobility tasks. No protective righting reactions noted in sitting position with loss of balance in all directions. Patient has significant weakness throughout. Vitals stable throughout session. Recommend to continue PT to maximize independence and facilitate return to prior level of function. SNF recommended at discharge.      Recommendations for follow up therapy are one component of a multi-disciplinary discharge planning process, led by the attending physician.  Recommendations may be updated based on patient status, additional functional criteria and insurance authorization.  Follow Up Recommendations Skilled nursing-short term rehab (<3 hours/day) Can patient physically be transported by private vehicle: No    Assistance Recommended at Discharge Frequent or constant Supervision/Assistance  Patient can return home with the following  Two people to help with walking and/or transfers;Two people to help with bathing/dressing/bathroom;Help with stairs or ramp for entrance;Assist for transportation;Assistance with feeding;Direct supervision/assist for medications management;Direct supervision/assist for financial management;Assistance with cooking/housework    Equipment Recommendations Other (comment) (to de determined  at next level of care)  Recommendations for Other Services       Functional Status Assessment Patient has had a recent decline in their functional status and demonstrates the ability to make significant improvements in function in a reasonable and predictable amount of time.     Precautions / Restrictions Precautions Precautions: Fall Restrictions Weight Bearing Restrictions: No      Mobility  Bed Mobility Overal bed mobility: Needs Assistance Bed Mobility: Supine to Sit, Sit to Supine     Supine to sit: Total assist, +2 for physical assistance Sit to supine: Total assist, +2 for physical assistance   General bed mobility comments: minimal active participation with bed mobility efforts despite cues for task initiation. profound weakness throughout, requiring significant assistance for mobility    Transfers                   General transfer comment: unsafe to attempt. will need a lift for getting OOB at this time    Ambulation/Gait                  Stairs            Wheelchair Mobility    Modified Rankin (Stroke Patients Only)       Balance Overall balance assessment: Needs assistance Sitting-balance support: Feet unsupported, Bilateral upper extremity supported Sitting balance-Leahy Scale: Zero Sitting balance - Comments: no protective righting reactions with loss of balance in all directions                                     Pertinent Vitals/Pain Pain Assessment Pain Assessment: Faces Faces Pain Scale: Hurts even more Pain Location: R leg Pain Descriptors / Indicators: Discomfort Pain Intervention(s): Limited activity within patient's tolerance, Monitored during session, Repositioned    Home Living Family/patient expects to be discharged to:: Unsure  Additional Comments: per chart, patient lives at home alone. patient unable to give further information due to mental status    Prior Function  Prior Level of Function : Patient poor historian/Family not available             Mobility Comments: per chart, patient had injured his right knee recently and has been getting around with a crutch.       Hand Dominance        Extremity/Trunk Assessment   Upper Extremity Assessment Upper Extremity Assessment: Generalized weakness    Lower Extremity Assessment Lower Extremity Assessment: Generalized weakness (patient is able to activate hip/knee/ankle movement)    Cervical / Trunk Assessment Cervical / Trunk Assessment:  (resting with right neck rotation preferred. can get to neutral with cues)  Communication   Communication: Expressive difficulties (tangential speech at times, low tone, difficult to understand)  Cognition Arousal/Alertness: Awake/alert Behavior During Therapy: WFL for tasks assessed/performed Overall Cognitive Status: No family/caregiver present to determine baseline cognitive functioning Area of Impairment: Following commands, Safety/judgement, Attention, Orientation                 Orientation Level: Disoriented to, Place, Time, Situation Current Attention Level: Focused   Following Commands: Follows one step commands inconsistently, Follows one step commands with increased time Safety/Judgement: Decreased awareness of safety, Decreased awareness of deficits              General Comments General comments (skin integrity, edema, etc.): patient on 45L HHFNC, Fi02 60%. vitals stable throughout session with heart rate in the 90's, Sp02 93% or above. bed in chair position at end of session with head of bed at 60 degrees to promote upright conditioning    Exercises     Assessment/Plan    PT Assessment Patient needs continued PT services  PT Problem List Decreased strength;Decreased range of motion;Decreased activity tolerance;Decreased balance;Decreased mobility;Decreased cognition;Decreased safety awareness;Decreased knowledge of  precautions       PT Treatment Interventions DME instruction;Stair training;Gait training;Functional mobility training;Therapeutic activities;Therapeutic exercise;Balance training;Neuromuscular re-education;Cognitive remediation;Patient/family education    PT Goals (Current goals can be found in the Care Plan section)  Acute Rehab PT Goals Patient Stated Goal: unable to state PT Goal Formulation: Patient unable to participate in goal setting Time For Goal Achievement: 03/28/22 Potential to Achieve Goals: Poor    Frequency Min 2X/week     Co-evaluation PT/OT/SLP Co-Evaluation/Treatment: Yes Reason for Co-Treatment: Complexity of the patient's impairments (multi-system involvement) PT goals addressed during session: Mobility/safety with mobility OT goals addressed during session: ADL's and self-care       AM-PAC PT "6 Clicks" Mobility  Outcome Measure Help needed turning from your back to your side while in a flat bed without using bedrails?: Total Help needed moving from lying on your back to sitting on the side of a flat bed without using bedrails?: Total Help needed moving to and from a bed to a chair (including a wheelchair)?: Total Help needed standing up from a chair using your arms (e.g., wheelchair or bedside chair)?: Total Help needed to walk in hospital room?: Total Help needed climbing 3-5 steps with a railing? : Total 6 Click Score: 6    End of Session Equipment Utilized During Treatment: Oxygen Activity Tolerance: Patient limited by fatigue Patient left: in bed;with call bell/phone within reach;with chair alarm set;with SCD's reapplied (bed in chair position with head of bed at 60 degrees) Nurse Communication: Mobility status PT Visit Diagnosis: Unsteadiness on feet (R26.81);Muscle weakness (generalized) (M62.81)  Time: 1287-8676 PT Time Calculation (min) (ACUTE ONLY): 26 min   Charges:   PT Evaluation $PT Eval Moderate Complexity: 1 Mod PT  Treatments $Therapeutic Activity: 8-22 mins        Donna Bernard, PT, MPT   Ina Homes 03/14/2022, 12:11 PM

## 2022-03-14 NOTE — Progress Notes (Signed)
Speech Language Pathology Treatment: Dysphagia  Patient Details Name: Bryan Mitchell MRN: [id number] DOB: August 30, 1960 Today's Date: 03/14/2022 Time: 0093-8182 SLP Time Calculation (min) (ACUTE ONLY): 60 min  Assessment / Plan / Recommendation Clinical Impression  Pt seen for ongoing therapy today; trials to assess readiness to initiate an oral diet(safely). Pt awake, much more verbal w/ full sentences intermittently. Continues w/ decreased awareness of situation/self; easily distracted but more intentional engagement w/ Therapist during po trials today. Mumbled/muttered speech at times. Weak Bilat UEs. MOD+ cues needed for follow through w/ all tasks. Somewhat fidgity in bed w/ R head turn/gaze/tilt noted but less prominent today.  W/ Sister present, No determined R or L oral weakness noted. On HFNC O2 4-6L; 50%. Afebrile; WBC WNL.   Pt continues to present w/ oropharyngeal phase dysphagia in setting of declined Cognitive status; declined Mental Status -- but some improvement in Mental Status today during po trials. Suspect impact from illness and lengthy intubation/sedation?. Family reported pt having Confusion on day prior to admit. ANY Cognitive/Mental status decline can impact overall awareness/timing of swallow and safety during po tasks which increases risk for aspiration, choking.  Pt's risk for aspiration is deemed MIN-MOD at this time w/ oral intake but reduced when following aspiration precautions and given feeding support/supervision.          Pt consumed trials of ice chips, thin and Nectar liquids via tsp/cup/straw(pinched straw), and purees w/ No immediate, overt clinical s/s of aspiration noted: no consistent decline in vocal quality; no cough nor throat clearing. No decline in respiratory status during/post trials. O2 sats remained at his baseline of 90-92%%, RR 18-23; HR 88.  Oral phase was much improved c/b improved and fairly timely bolus management w/ bolus cohesion and timely A-P  transfer; oral clearing b/t trials noted w/ little to no anterior oral region residue along teeth/tongue. No specific Right or Left oral residue appreciated. Pt exhibited an inconsistent MUNCHING pattern w/ puree bolus trials; more effective mastication of ice chips w/ bolus control. Intermittent prolonged oral phase noted but much improved overall. Improved oral tone and attention orally to po trials.  He required MAX support w/ feeding -- unable to use UEs to hold Cup himself. Verbal/visual/tactile cues given d/t the Cognitive decline.   Noted white, patchy coating on tongue - NSG made aware.     In setting of continued Cognitive decline, illness, reduced awareness and follow through, and his risk for aspiration, recommend a dysphagia level 1 (puree) diet w/ Thin liquids. Aspiration precautions. Feeding support and Supervision w/ all oral intake. Monitor for oral clearing as needed.  Frequent oral care for hygiene and stimulation of swallowing; tx for potential THRUSH - MD updated. Sister and MD/NSG updated.  Hope is that pt will be able to meet his needs orally to avoid alternative means of nutrition.    ST services will f/u w/ pt's status, po trials to upgrade foods in his diet. Recommend ongoing support of Dietician and Palliative Care for ongoing GOC and education. Precautions posted in room.     HPI HPI: Pt is a 61 yo M presenting to [facility] ED on 02/23/22 from home for evaluation of worsening fatigue, poor appetite and SOB over the past 2 weeks. Per ED documentation, patient also complained of worsening bilateral leg swelling and pain. He started having increased congestion and poor PO intake also. On the night of 8/18 the sister spoke with the patient on the phone. Pt  told her, "I feel like I'm  dying". On 8/19, she convinced the patient to go to the hospital to be evaluated. She reports he does not trust doctors and has difficulty following outpatient medication regimens. He was prescribed home O2,  and has oxygen tanks at the house but has not used them in 2 years per his sister. He also injured his RIGHT knee a few weeks ago and had been getting around with a crutch. Sister reported he had been a little more confused on 8/19.  At admit to ICU, pt had worsening acute on chronic hypoxic/hypercapnic respiratory failure s/t suspected COPD exacerbation in the setting of HFpEF and possible pulmonary hypertension requiring emergent intubation and mechanical ventilatory support.  Pt extubated on 03/11/2022; placed on BiPAP then now on [place] O2 support.   CXR: Cardiomegaly with unchanged diffuse bilateral interstitial  pulmonary opacity and small layering pleural effusions, findings  likely edema.  MRI: No acute intracranial abnormality.  2. Chronic microvascular ischemic disease for age.      SLP Plan  Continue with current plan of care      Recommendations for follow up therapy are one component of a multi-disciplinary discharge planning process, led by the attending physician.  Recommendations may be updated based on patient status, additional functional criteria and insurance authorization.    Recommendations  Diet recommendations: Dysphagia 1 (puree);Thin liquid Liquids provided via: Cup;Straw (monitor) Medication Administration: Crushed with puree Supervision: Staff to assist with self feeding;Full supervision/cueing for compensatory strategies Compensations: Minimize environmental distractions;Slow rate;Small sips/bites;Lingual sweep for clearance of pocketing;Multiple dry swallows after each bite/sip;Follow solids with liquid Postural Changes and/or Swallow Maneuvers: Out of bed for meals;Seated upright 90 degrees;Upright 30-60 min after meal                General recommendations:  (Dietician f/u; Palliative following) Oral Care Recommendations: Oral care BID;Oral care before and after PO;Staff/trained caregiver to provide oral care Follow Up Recommendations: Skilled nursing-short term  rehab (<3 hours/day) (TBD) Assistance recommended at discharge: Frequent or constant Supervision/Assistance SLP Visit Diagnosis: Dysphagia, oropharyngeal phase (R13.12) (cognitive decline) Plan: Continue with current plan of care             Jerilynn Som, MS, CCC-SLP Speech Language Pathologist Rehab Services; [facility] [phone] (ascom) Chayna Surratt  03/14/2022, 3:42 PM

## 2022-03-14 NOTE — Consult Note (Signed)
PHARMACY CONSULT NOTE  Pharmacy Consult for Electrolyte Monitoring and Replacement   Recent Labs: Potassium (mmol/L)  Date Value  03/14/2022 3.5  04/21/2012 4.8   Magnesium (mg/dL)  Date Value  19/14/7829 1.9   Calcium (mg/dL)  Date Value  56/21/3086 9.6   Calcium, Total (mg/dL)  Date Value  57/84/6962 8.8   Albumin (g/dL)  Date Value  95/28/4132 4.1  04/21/2012 4.0   Phosphorus (mg/dL)  Date Value  44/07/270 3.5   Sodium (mmol/L)  Date Value  03/14/2022 146 (H)  04/21/2012 139   Assessment: Pharmacy has been consulted to monitor electrolytes in 61yo male presenting to the ED complaining of worsening bilateral leg swelling and pain. He was self-treating with mucinex and took a COVID test which was negative. He is currently intubated and sedated in CCU. Pharmacy is asked to follow and replace electrolytes while in CCU  Nutrition: Tube feeds @70  ml/h>OFF; NPO  MIVF: D5W at 77mL/hr Medications: +lasix 40mg  IV QD (9/06>>  Goal of Therapy:  Electrolytes within normal limits   Plan:  UOP: 0.8> (added on lasix 40mg  IV QD)>0.9; Scr stable 0.67>0.71  Na 150>149>146: D5W continues at 96ml/h (ULN range; gluc 98) K 3.3>3.5: Replete with Kcl q1h x4 doses Mg 1.6>1.9: Replete with MgSO 2g IV x1 dose Phos 3.6>3.5: WNL, no further repletion today. Follow-up electrolytes with AM labs tomorrow  03/14/2022 9:27 AM

## 2022-03-15 LAB — BASIC METABOLIC PANEL
Anion gap: 9 (ref 5–15)
BUN: 36 mg/dL — ABNORMAL HIGH (ref 6–20)
CO2: 33 mmol/L — ABNORMAL HIGH (ref 22–32)
Calcium: 10.4 mg/dL — ABNORMAL HIGH (ref 8.9–10.3)
Chloride: 106 mmol/L (ref 98–111)
Creatinine, Ser: 0.71 mg/dL (ref 0.61–1.24)
GFR, Estimated: >60 mL/min (ref >60)
Glucose, Bld: 145 mg/dL — ABNORMAL HIGH (ref 70–99)
Potassium: 3.8 mmol/L (ref 3.5–5.1)
Sodium: 148 mmol/L — ABNORMAL HIGH (ref 135–145)

## 2022-03-15 LAB — GLUCOSE, CAPILLARY
Glucose-Capillary: 144 mg/dL — ABNORMAL HIGH (ref 70–99)
Glucose-Capillary: 133 mg/dL — ABNORMAL HIGH (ref 70–99)
Glucose-Capillary: 240 mg/dL — ABNORMAL HIGH (ref 70–99)
Glucose-Capillary: 166 mg/dL — ABNORMAL HIGH (ref 70–99)
Glucose-Capillary: 212 mg/dL — ABNORMAL HIGH (ref 70–99)
Glucose-Capillary: 107 mg/dL — ABNORMAL HIGH (ref 70–99)

## 2022-03-15 LAB — PHOSPHORUS: Phosphorus: 3.9 mg/dL (ref 2.5–4.6)

## 2022-03-15 LAB — MAGNESIUM: Magnesium: 2 mg/dL (ref 1.7–2.4)

## 2022-03-15 MED ORDER — SODIUM CHLORIDE 0.45 % IV SOLN: INTRAVENOUS | Status: DC

## 2022-03-15 MED ORDER — SODIUM CHLORIDE 0.45 % IV SOLN: Freq: Once | INTRAVENOUS | Status: AC

## 2022-03-15 NOTE — TOC Progression Note (Signed)
Transition of Care [facility]) - Progression Note    Patient Details  Name: Bryan Mitchell MRN: [id number] Date of Birth: 1960-08-01  Transition of [facility]) CM/SW Contact  Allayne Butcher, RN Phone Number: 03/15/2022, 4:49 PM  Clinical Narrative:    Patient has been extubated and is tolerating HFNC 10L.  Current recommendation is for SNF.  TOC team has started a bed search offering a 30 day LOG for SNF.     Expected Discharge Plan: Skilled Nursing Facility Barriers to Discharge: Continued Medical Work up  Expected Discharge Plan and Services Expected Discharge Plan: Skilled Nursing Facility   Discharge Planning Services: CM Consult Post Acute Care Choice: Skilled Nursing Facility Living arrangements for the past 2 months: Single Family Home                                       Social Determinants of Health (SDOH) Interventions    Readmission Risk Interventions     No data to display

## 2022-03-15 NOTE — Progress Notes (Signed)
Speech Language Pathology Treatment: Dysphagia  Patient Details Name: Bryan Mitchell MRN: [id number] DOB: 05-16-1961 Today's Date: 03/15/2022 Time: 1330-1410 SLP Time Calculation (min) (ACUTE ONLY): 40 min  Assessment / Plan / Recommendation Clinical Impression  Pt seen for ongoing therapy today; trials to assess toleration of oral diet(initiated yesterday). Pt awake, much more verbal though continues w/ decreased awareness of situation/self; easily distracted and not cooperative at times. More intentional engagement w/ Therapist during po trials. Mumbled/muttered speech at times. Weak Bilat UEs. MOD+ cues needed for follow through w/ all tasks. Somewhat fidgity in bed w/ R head turn/gaze/tilt noted but less prominent today.  Family not present at this session. No determined R or L oral weakness noted. On HFNC O2 8L; 45-50%. Afebrile; WBC WNL.  Pt was NOT cooperative in sitting up for oral intake initially using many curse words. Did cooperate w/ allowing pillow placed lower behind back earlier w/ NSG, staff given verbal encouragement and education on aspiration precautions.    Pt continues to present w/ oropharyngeal phase dysphagia and risk for aspiration/aspiration pneumonia in setting of declined Cognitive status; declined Mental Status -- but some improvement intermittently overall. Suspect impact from illness and lengthy intubation/sedation?. Family reported pt having Confusion on day prior to admit. ANY Cognitive/Mental status decline can impact overall awareness/timing of swallow and safety during po tasks which increases risk for aspiration, choking.  Pt's risk for aspiration is deemed MIN-MOD at this time w/ oral intake but reduced when following aspiration precautions and given feeding support/supervision.          Pt consumed trials of thin liquids via straw(pinched straw) and purees w/ No immediate, overt clinical s/s of aspiration noted: no consistent decline in vocal quality; no cough  nor throat clearing. No decline in respiratory status during/post trials. O2 sats remained at his baseline of 92%%, RR 24; HR 90.  Oral phase was c/b improved and fairly timely bolus management w/ bolus cohesion and timely A-P transfer since initial eval but impacted by distraction and talking during po trials today. Pt exhibited an inconsistent MUNCHING pattern w/ puree bolus trials; intermittent prolonged oral phase noted but much improved overall. Suspect impact from decreased awareness and distraction w/ taking po trials this session thus impact on the oral phase overall.  He required MAX support w/ feeding -- unable to use UEs to hold Cup himself. Verbal/visual/tactile cues given d/t the Cognitive decline.      In setting of continued Cognitive decline, illness, reduced awareness and follow through, and his risk for aspiration, recommend continue a dysphagia level 1 (puree) diet w/ Thin liquids(pinched straw to limit large sips). Aspiration precautions. Feeding support and Supervision w/ all oral intake. Monitor for oral clearing as needed. Recommend Time for improved mental and medical status b/f further diet upgrade d/t risk for choking.  Frequent oral care for hygiene and stimulation of swallowing; tx for potential THRUSH. Hope is that pt will be able to meet his needs orally to avoid alternative means of nutrition.    ST services will f/u w/ pt's status Next Week, trials to upgrade foods in his diet when mental status has improved. Recommend ongoing support of Dietician and Palliative Care for ongoing GOC and education. Precautions posted in room.        HPI HPI: Pt is a 61 yo M presenting to [facility] ED on 02/23/22 from home for evaluation of worsening fatigue, poor appetite and SOB over the past 2 weeks. Per ED documentation, patient also complained of  worsening bilateral leg swelling and pain. He started having increased congestion and poor PO intake also. On the night of 8/18 the sister spoke  with the patient on the phone. Pt  told her, "I feel like I'm dying". On 8/19, she convinced the patient to go to the hospital to be evaluated. She reports he does not trust doctors and has difficulty following outpatient medication regimens. He was prescribed home O2, and has oxygen tanks at the house but has not used them in 2 years per his sister. He also injured his RIGHT knee a few weeks ago and had been getting around with a crutch. Sister reported he had been a little more confused on 8/19.  At admit to ICU, pt had worsening acute on chronic hypoxic/hypercapnic respiratory failure s/t suspected COPD exacerbation in the setting of HFpEF and possible pulmonary hypertension requiring emergent intubation and mechanical ventilatory support.  Pt extubated on 03/11/2022; placed on BiPAP then now on [place] O2 support.   CXR: Cardiomegaly with unchanged diffuse bilateral interstitial  pulmonary opacity and small layering pleural effusions, findings  likely edema.  MRI: No acute intracranial abnormality.  2. Chronic microvascular ischemic disease for age.      SLP Plan  Continue with current plan of care      Recommendations for follow up therapy are one component of a multi-disciplinary discharge planning process, led by the attending physician.  Recommendations may be updated based on patient status, additional functional criteria and insurance authorization.    Recommendations  Diet recommendations: Dysphagia 1 (puree);Thin liquid Liquids provided via: Cup;Straw (pinch straw to control bolus amount) Medication Administration: Crushed with puree Supervision: Staff to assist with self feeding;Full supervision/cueing for compensatory strategies Compensations: Minimize environmental distractions;Slow rate;Small sips/bites;Lingual sweep for clearance of pocketing;Multiple dry swallows after each bite/sip;Follow solids with liquid Postural Changes and/or Swallow Maneuvers: Out of bed for meals;Seated upright 90  degrees;Upright 30-60 min after meal                General recommendations:  (Dietician f/u; Palliative care f/u) Oral Care Recommendations: Oral care BID;Oral care before and after PO;Staff/trained caregiver to provide oral care Follow Up Recommendations: Skilled nursing-short term rehab (<3 hours/day) (TBD) Assistance recommended at discharge: Frequent or constant Supervision/Assistance SLP Visit Diagnosis: Dysphagia, oropharyngeal phase (R13.12) (cognitive/mental status decline) Plan: Continue with current plan of care            Jerilynn Som, MS, CCC-SLP Speech Language Pathologist Rehab Services; [facility] [phone] (ascom) Chevette Fee  03/15/2022, 3:50 PM

## 2022-03-15 NOTE — NC FL2 (Signed)
[place] MEDICAID FL2 LEVEL OF CARE SCREENING TOOL     IDENTIFICATION  Patient Name: Bryan Mitchell Birthdate: Sep 19, 1960 Sex: male Admission Date (Current Location): 02/23/2022  [facility] and [place] Number:  Chiropodist and Address:  [facility], [address] [place], [place] [address]      Provider Number: [id number]  Attending Physician Name and Address:  Enedina Finner, MD  Relative Name and Phone Number:  Lupita Leash (sister) [phone]    Current Level of Care: Hospital Recommended Level of Care: Skilled Nursing Facility Prior Approval Number:    Date Approved/Denied:   PASRR Number: [id number]  Discharge Plan: SNF    Current Diagnoses: Patient Active Problem List   Diagnosis Date Noted   Toxic metabolic encephalopathy    Anisocoria    Pressure injury of skin 03/06/2022   Acute hypoxemic respiratory failure (HCC) 02/23/2022   Acute respiratory failure (HCC) 02/23/2022   COPD exacerbation (HCC)    Type 2 diabetes mellitus with complication, without long-term current use of insulin (HCC)    Edema of lower extremity    Acute respiratory failure with hypoxia (HCC) 01/13/2018   Morbid obesity (HCC) 01/13/2018   Hypoxia 12/16/2016   Cellulitis and abscess of trunk 12/16/2016   Tobacco abuse 12/16/2016   Allergic reaction 12/16/2016   Polycythemia 12/16/2016    Orientation RESPIRATION BLADDER Height & Weight     Self  O2 (HFNC 10L) Continent, External catheter Weight: 283 lb 15.2 oz (128.8 kg) Height:  5\' 8"  (172.7 cm)  BEHAVIORAL SYMPTOMS/MOOD NEUROLOGICAL BOWEL NUTRITION STATUS      Incontinent Diet (DYS 1)  AMBULATORY STATUS COMMUNICATION OF NEEDS Skin   Extensive Assist Verbally Other (Comment) (rash left arm, pressure injuries left hand, left thigh wound open)                       Personal Care Assistance Level of Assistance  Bathing, Feeding, Dressing, Total care Bathing Assistance: Maximum assistance Feeding  assistance: Limited assistance (DYS 1) Dressing Assistance: Maximum assistance Total Care Assistance: Maximum assistance   Functional Limitations Info  Sight, Hearing, Speech Sight Info: Adequate Hearing Info: Adequate Speech Info: Adequate    SPECIAL CARE FACTORS FREQUENCY  PT (By licensed PT), OT (By licensed OT)     PT Frequency: min 4x weekly OT Frequency: min 4x weekly            Contractures Contractures Info: Not present    Additional Factors Info  Code Status, Allergies Code Status Info: full Allergies Info: Other   Beef-derived Products   Pork-derived Products           Current Medications (03/15/2022):  This is the current hospital active medication list Current Facility-Administered Medications  Medication Dose Route Frequency Provider Last Rate Last Admin   0.9 %  sodium chloride infusion  250 mL Intravenous Continuous Rust-Chester, 05/15/2022, NP   Held at 02/23/22 2350   acetaminophen (TYLENOL) tablet 650 mg  650 mg Oral Q4H PRN 02/25/22, MD       acetaminophen (TYLENOL) tablet 650 mg  650 mg Per Tube Q4H PRN Rometta Emery, MD   650 mg at 03/10/22 0025   bisacodyl (DULCOLAX) suppository 10 mg  10 mg Rectal Daily PRN 05/10/22, NP   10 mg at 03/04/22 1456   budesonide (PULMICORT) nebulizer solution 0.25 mg  0.25 mg Nebulization BID Rust-Chester, Britton L, NP   0.25 mg at 03/15/22 0846   Chlorhexidine  Gluconate Cloth 2 % PADS 6 each  6 each Topical Daily Rust-Chester, Britton L, NP   6 each at 03/15/22 0054   docusate (COLACE) 50 MG/5ML liquid 100 mg  100 mg Per Tube BID Rust-Chester, Britton L, NP   100 mg at 03/15/22 0904   feeding supplement (ENSURE ENLIVE / ENSURE PLUS) liquid 237 mL  237 mL Oral TID BM Enedina Finner, MD   237 mL at 03/15/22 1402   fondaparinux (ARIXTRA) injection 2.5 mg  2.5 mg Subcutaneous Q24H Garba, Mohammad L, MD   2.5 mg at 03/14/22 1957   furosemide (LASIX) injection 40 mg  40 mg Intravenous Daily Enedina Finner, MD   40 mg  at 03/15/22 0904   insulin aspart (novoLOG) injection 0-20 Units  0-20 Units Subcutaneous Q4H Tressie Ellis, RPH   7 Units at 03/15/22 1142   insulin aspart (novoLOG) injection 8 Units  8 Units Subcutaneous Q4H Rust-Chester, Britton L, NP   8 Units at 03/14/22 0336   insulin glargine-yfgn (SEMGLEE) injection 20 Units  20 Units Subcutaneous QHS Salena Saner, MD   20 Units at 03/14/22 2152   ipratropium-albuterol (DUONEB) 0.5-2.5 (3) MG/3ML nebulizer solution 3 mL  3 mL Nebulization Q6H Rust-Chester, Britton L, NP   3 mL at 03/15/22 1325   ipratropium-albuterol (DUONEB) 0.5-2.5 (3) MG/3ML nebulizer solution 3 mL  3 mL Nebulization Q4H PRN Rust-Chester, Micheline Rough L, NP       metoprolol tartrate (LOPRESSOR) injection 5 mg  5 mg Intravenous Q8H Salena Saner, MD   5 mg at 03/15/22 1402   multivitamin with minerals tablet 1 tablet  1 tablet Oral Daily Enedina Finner, MD   1 tablet at 03/15/22 0904   nystatin (MYCOSTATIN) 100000 UNIT/ML suspension 500,000 Units  5 mL Oral QID Enedina Finner, MD   500,000 Units at 03/15/22 1402   ofloxacin (OCUFLOX) 0.3 % ophthalmic solution 2 drop  2 drop Both Eyes QID Salena Saner, MD   2 drop at 03/15/22 1407   Oral care mouth rinse  15 mL Mouth Rinse 4 times per day Salena Saner, MD   15 mL at 03/15/22 1200   Oral care mouth rinse  15 mL Mouth Rinse PRN Salena Saner, MD       polyethylene glycol (MIRALAX / GLYCOLAX) packet 17 g  17 g Per Tube Daily Rust-Chester, Britton L, NP   17 g at 03/09/22 0926   sodium chloride flush (NS) 0.9 % injection 10-40 mL  10-40 mL Intracatheter Q12H Kasa, Wallis Bamberg, MD   10 mL at 03/15/22 0906   sodium chloride flush (NS) 0.9 % injection 10-40 mL  10-40 mL Intracatheter PRN Erin Fulling, MD       tamsulosin (FLOMAX) capsule 0.4 mg  0.4 mg Oral Daily Enedina Finner, MD   0.4 mg at 03/15/22 6015     Discharge Medications: Please see discharge summary for a list of discharge medications.  Relevant Imaging  Results:  Relevant Lab Results:   Additional Information SSN:[id number]  Gildardo Griffes, LCSW

## 2022-03-15 NOTE — Progress Notes (Signed)
[facility]  - [place] at [facility]   PATIENT NAME: Bryan Mitchell    MR#:  [id number]  DATE OF BIRTH:  [birth year]  SUBJECTIVE:  no family at bedside. Patient has been admitted with acute metabolic encephalopathy with respiratory distress placed on mechanical ventilator. Extubated now on flow nasal cannula. Poor lung volumes. Morbidly obese likely has underlying sleep apnea. Worked with speech therapy today able to swallow. Started on PO nutrition. Eating well    VITALS:  Blood pressure 116/72, pulse (!) 101, temperature 97.8 F (36.6 C), resp. rate (!) 21, height 5\' 8"  (1.727 m), weight 128.8 kg, SpO2 90 %.  PHYSICAL EXAMINATION:   GENERAL:  61 y.o.-year-old patient lying in the bed with no acute distress. Morbid obesity LUNGS: decreased breath sounds bilaterally, no wheezing, rales, rhonchi. High flow nasal cannula oxygen CARDIOVASCULAR: S1, S2 normal. No murmurs, rubs, or gallops.  ABDOMEN: Soft, nontender, nondistended. Bowel sounds present. Foley present EXTREMITIES: ++edema NEUROLOGIC: nonfocal  patient is alert and awake SKIN: No obvious rash, lesion, or ulcer.   LABORATORY PANEL:  CBC Recent Labs  Lab 03/13/22 0411  WBC 8.7  HGB 17.3*  HCT 59.7*  PLT 278     Chemistries  Recent Labs  Lab 03/15/22 0855  NA 148*  K 3.8  CL 106  CO2 33*  GLUCOSE 145*  BUN 36*  CREATININE 0.71  CALCIUM 10.4*  MG 2.0    Cardiac Enzymes No results for input(s): "TROPONINI" in the last 168 hours. RADIOLOGY:  No results found.  Assessment and Plan 61 yo M presenting to [facility] ED on 02/23/22 from home in a wheelchair with his sister for evaluation of worsening fatigue, poor appetite and SOB over the past 2 weeks. Per ED documentation, patient also complained of worsening bilateral leg swelling and pain.    CT angio negative for pulmonary edema/effusions, negative for PE.  Kindly review events from 19 August to set 7th in progress notes  Acute on  chronic hypoxic and hypercapnia respiratory failure COPD exacerbation congestive heart failure diastolic rhinovirus infection-- resolved morbid obesity suspected sleep apnea tobacco abuse -- patient now extubated on high flow nasal cannula oxygen -wean as tolerated- --BiPAP as needed -- patient remains a high risk for re-intubation -- patient is full code -- PRN bronchodilators -- completed IV steroids, IV antibiotic -- diuresis as BP and renal function permits  Acute on chronic diastolic heart failure pulmonary hypertension -- Echo shows EF 60 to 65%, grade 2 diastolic dysfunction, pulmonary hypertension -- resume daily Lasix  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 Palliative care input appreciated. Patient remains a full code   Procedures: Family communication : none today Consults : pal care CODE STATUS: full DVT Prophylaxis :arixtra Level of care: Progressive Status is: Inpatient Remains inpatient appropriate because: remains on high flow nasal cannula oxygen.     TOTAL TIME TAKING CARE OF THIS PATIENT: 35 minutes.  >50% time spent on counselling and coordination of care  Note: This dictation was prepared with Dragon dictation along with smaller phrase technology. Any transcriptional errors that result from this process are unintentional.  21 August M.D    [facility]   CC: Primary  care physician; Pcp, No

## 2022-03-16 LAB — CBC
HCT: 57.8 % — ABNORMAL HIGH (ref 39.0–52.0)
Hemoglobin: 17.4 g/dL — ABNORMAL HIGH (ref 13.0–17.0)
MCH: 28 pg (ref 26.0–34.0)
MCHC: 30.1 g/dL (ref 30.0–36.0)
MCV: 92.9 fL (ref 80.0–100.0)
Platelets: 354 10*3/uL (ref 150–400)
RBC: 6.22 MIL/uL — ABNORMAL HIGH (ref 4.22–5.81)
RDW: 17.1 % — ABNORMAL HIGH (ref 11.5–15.5)
WBC: 7.5 10*3/uL (ref 4.0–10.5)
nRBC: 0 % (ref 0.0–0.2)

## 2022-03-16 LAB — BASIC METABOLIC PANEL
Anion gap: 8 (ref 5–15)
BUN: 31 mg/dL — ABNORMAL HIGH (ref 6–20)
CO2: 31 mmol/L (ref 22–32)
Calcium: 9.6 mg/dL (ref 8.9–10.3)
Chloride: 104 mmol/L (ref 98–111)
Creatinine, Ser: 0.68 mg/dL (ref 0.61–1.24)
GFR, Estimated: >60 mL/min (ref >60)
Glucose, Bld: 144 mg/dL — ABNORMAL HIGH (ref 70–99)
Potassium: 2.9 mmol/L — ABNORMAL LOW (ref 3.5–5.1)
Sodium: 143 mmol/L (ref 135–145)

## 2022-03-16 LAB — GLUCOSE, CAPILLARY
Glucose-Capillary: 132 mg/dL — ABNORMAL HIGH (ref 70–99)
Glucose-Capillary: 130 mg/dL — ABNORMAL HIGH (ref 70–99)
Glucose-Capillary: 125 mg/dL — ABNORMAL HIGH (ref 70–99)
Glucose-Capillary: 114 mg/dL — ABNORMAL HIGH (ref 70–99)
Glucose-Capillary: 138 mg/dL — ABNORMAL HIGH (ref 70–99)
Glucose-Capillary: 136 mg/dL — ABNORMAL HIGH (ref 70–99)

## 2022-03-16 LAB — MAGNESIUM: Magnesium: 1.7 mg/dL (ref 1.7–2.4)

## 2022-03-16 LAB — PHOSPHORUS: Phosphorus: 3.5 mg/dL (ref 2.5–4.6)

## 2022-03-16 MED ORDER — METOPROLOL TARTRATE 25 MG PO TABS
12.5000 mg | ORAL_TABLET | Freq: Two times a day (BID) | ORAL | Status: DC
  Administered 2022-03-16 – 2022-04-12 (×54): 12.5 mg via ORAL
  Filled 2022-03-16 (×55): qty 1

## 2022-03-16 MED ORDER — MAGNESIUM SULFATE 2 GM/50ML IV SOLN
2.0000 g | Freq: Once | INTRAVENOUS | Status: AC
  Administered 2022-03-16: 2 g via INTRAVENOUS
  Filled 2022-03-16: qty 50

## 2022-03-16 MED ORDER — POTASSIUM CHLORIDE CRYS ER 20 MEQ PO TBCR
40.0000 meq | EXTENDED_RELEASE_TABLET | ORAL | Status: AC
  Administered 2022-03-16 (×2): 40 meq via ORAL
  Filled 2022-03-16 (×2): qty 2

## 2022-03-16 MED ORDER — DOCUSATE SODIUM 50 MG/5ML PO LIQD
100.0000 mg | Freq: Two times a day (BID) | ORAL | Status: DC
  Administered 2022-03-16 – 2022-03-26 (×16): 100 mg via ORAL
  Filled 2022-03-16 (×20): qty 10

## 2022-03-16 MED ORDER — POLYETHYLENE GLYCOL 3350 17 G PO PACK
17.0000 g | PACK | Freq: Every day | ORAL | Status: DC
  Administered 2022-03-17 – 2022-04-08 (×11): 17 g via ORAL
  Filled 2022-03-16 (×18): qty 1

## 2022-03-16 MED ORDER — IPRATROPIUM-ALBUTEROL 0.5-2.5 (3) MG/3ML IN SOLN
3.0000 mL | Freq: Three times a day (TID) | RESPIRATORY_TRACT | Status: DC
  Administered 2022-03-17: 3 mL via RESPIRATORY_TRACT
  Filled 2022-03-16: qty 3

## 2022-03-16 NOTE — Progress Notes (Signed)
Bipap on standby. Remains on HF cannula. No distress noted. Mitts on patient

## 2022-03-16 NOTE — Progress Notes (Signed)
[facility]  - [place] at [facility]   PATIENT NAME: Bryan Mitchell    MR#:  [id number]  DATE OF BIRTH:  May 03, 1961  SUBJECTIVE:  no family at bedside.  Patient has been admitted with acute metabolic encephalopathy with respiratory distress placed Mitchell mechanical ventilator. Extubated now Mitchell flow nasal cannula. Poor lung volumes. Morbidly obese likely has underlying sleep apnea. Worked with speech therapy today able to swallow. Started Mitchell PO nutrition. Eating well   VITALS:  Blood pressure 111/77, pulse (!) 104, temperature 97.9 F (36.6 C), temperature source Oral, resp. rate (!) 25, height 5\' 8"  (1.727 m), weight 128.8 kg, SpO2 92 %.  PHYSICAL EXAMINATION:   GENERAL:  61 y.o.-year-old patient lying in the bed with no acute distress. Morbid obesity LUNGS: decreased breath sounds bilaterally, no wheezing, rales, rhonchi. High flow nasal cannula oxygen CARDIOVASCULAR: S1, S2 normal. No murmurs, rubs, or gallops.  ABDOMEN: Soft, nontender, nondistended. Bowel sounds present. Foley present + EXTREMITIES: ++edema NEUROLOGIC: nonfocal  patient is alert and awake SKIN: No obvious rash, lesion, or ulcer.   LABORATORY PANEL:  CBC Recent Labs  Lab 03/16/22 0419  WBC 7.5  HGB 17.4*  HCT 57.8*  PLT 354     Chemistries  Recent Labs  Lab 03/16/22 0419  NA 143  K 2.9*  CL 104  CO2 31  GLUCOSE 144*  BUN 31*  CREATININE 0.68  CALCIUM 9.6  MG 1.7    Cardiac Enzymes No results for input(s): "TROPONINI" in the last 168 hours. RADIOLOGY:  No results found.  Assessment and Plan 61 yo M presenting to [facility] ED Mitchell 02/23/22 from home in a wheelchair with his sister for evaluation of worsening fatigue, poor appetite and SOB over the past 2 weeks. Per ED documentation, patient also complained of worsening bilateral leg swelling and pain.    CT angio negative for pulmonary edema/effusions, negative for PE.  Kindly review events from 19 August to set 7th in progress  notes  Acute Mitchell chronic hypoxic and hypercapnia respiratory failure COPD exacerbation congestive heart failure diastolic rhinovirus infection-- resolved morbid obesity suspected sleep apnea tobacco abuse -- patient now extubated Mitchell high flow nasal cannula oxygen -wean as tolerated- --BiPAP as needed -- patient remains a high risk for re-intubation -- patient is full code -- PRN bronchodilators -- completed IV steroids, IV antibiotic -- diuresis as BP and renal function permits  Acute Mitchell chronic diastolic heart failure pulmonary hypertension -- Echo shows EF 60 to 65%, grade 2 diastolic dysfunction, pulmonary hypertension -- resume daily Lasix  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 Palliative care input appreciated. Patient remains a full code   Procedures:mechanical ventilator Family communication : Sister 21 August Consults : pal care CODE STATUS: full DVT Prophylaxis :arixtra Level of care: Progressive Status is: Inpatient Remains inpatient appropriate because: remains Mitchell high flow nasal cannula oxygen.     TOTAL TIME TAKING CARE OF THIS PATIENT: 35 minutes.  >50% time spent Mitchell counselling and coordination of care  Note: This dictation was prepared with Dragon dictation along with smaller phrase technology. Any transcriptional errors that result from this process are unintentional.  Lupita Leash M.D    [facility]  CC: Primary care physician; Pcp, No

## 2022-03-16 NOTE — Progress Notes (Signed)
Patient sleeping in bed at this time. Has been intermittently asleep since 0700. Patient refuses to stay awake for breakfast this morning. Will continue to offer food to patient. Patient unwilling to allow this RN to float arms on pillows, straightening arms and pressing firmly down into bed. Patient appears to be comfortable at this time. Vitals WNL. Will continue to assess closely.

## 2022-03-16 NOTE — Progress Notes (Signed)
Sister Lupita Leash updated via phone call by this RN that patient moved to room 237 on 2A

## 2022-03-16 NOTE — Progress Notes (Addendum)
Patient assisted by RN  to drink 2 Ensure and one container of applesauce. Patient declined further food offers, will continue to encourage oral intake. Sister Lupita Leash  updated on phone call by this RN. Potassium replaced (2.9) , Magnesium replaced (1.7). Patient has garbled speech but expressed "Thank You" for care provided. Pleasant and agrees to care plan at this time.

## 2022-03-17 LAB — BASIC METABOLIC PANEL
Anion gap: 7 (ref 5–15)
BUN: 25 mg/dL — ABNORMAL HIGH (ref 6–20)
CO2: 30 mmol/L (ref 22–32)
Calcium: 9.6 mg/dL (ref 8.9–10.3)
Chloride: 107 mmol/L (ref 98–111)
Creatinine, Ser: 0.64 mg/dL (ref 0.61–1.24)
GFR, Estimated: >60 mL/min (ref >60)
Glucose, Bld: 120 mg/dL — ABNORMAL HIGH (ref 70–99)
Potassium: 3.8 mmol/L (ref 3.5–5.1)
Sodium: 144 mmol/L (ref 135–145)

## 2022-03-17 LAB — PHOSPHORUS: Phosphorus: 3.2 mg/dL (ref 2.5–4.6)

## 2022-03-17 LAB — GLUCOSE, CAPILLARY
Glucose-Capillary: 118 mg/dL — ABNORMAL HIGH (ref 70–99)
Glucose-Capillary: 130 mg/dL — ABNORMAL HIGH (ref 70–99)
Glucose-Capillary: 130 mg/dL — ABNORMAL HIGH (ref 70–99)
Glucose-Capillary: 135 mg/dL — ABNORMAL HIGH (ref 70–99)
Glucose-Capillary: 143 mg/dL — ABNORMAL HIGH (ref 70–99)
Glucose-Capillary: 165 mg/dL — ABNORMAL HIGH (ref 70–99)

## 2022-03-17 LAB — MAGNESIUM: Magnesium: 1.9 mg/dL (ref 1.7–2.4)

## 2022-03-17 NOTE — Progress Notes (Signed)
[facility]  - [place] at [facility]   PATIENT NAME: Bryan Mitchell    MR#:  [id number]  DATE OF BIRTH:  [birth year]  SUBJECTIVE:  no family at bedside.  Patient has been admitted with acute metabolic encephalopathy with respiratory distress placed on mechanical ventilator. Extubated now on flow nasal cannula. Poor lung volumes. Morbidly obese likely has underlying sleep apnea.  Poor historian. Some intermittent confusion  VITALS:  Blood pressure 127/86, pulse 95, temperature 98.6 F (37 C), resp. rate 18, height 5\' 8"  (1.727 m), weight 129.4 kg, SpO2 91 %.  PHYSICAL EXAMINATION:   GENERAL:  61 y.o.-year-old patient lying in the bed with no acute distress. Morbid obesity LUNGS: decreased breath sounds bilaterally, no wheezing, rales, rhonchi. High flow nasal cannula oxygen CARDIOVASCULAR: S1, S2 normal. No murmurs, rubs, or gallops.  ABDOMEN: Soft, nontender, nondistended. Bowel sounds present. Foley present + EXTREMITIES: ++edema NEUROLOGIC: nonfocal  patient is alert and awake SKIN: No obvious rash, lesion, or ulcer.   LABORATORY PANEL:  CBC Recent Labs  Lab 03/16/22 0419  WBC 7.5  HGB 17.4*  HCT 57.8*  PLT 354     Chemistries  Recent Labs  Lab 03/17/22 0625  NA 144  K 3.8  CL 107  CO2 30  GLUCOSE 120*  BUN 25*  CREATININE 0.64  CALCIUM 9.6  MG 1.9    Cardiac Enzymes No results for input(s): "TROPONINI" in the last 168 hours. RADIOLOGY:  No results found.  Assessment and Plan 61 yo M presenting to [facility] ED on 02/23/22 from home in a wheelchair with his sister for evaluation of worsening fatigue, poor appetite and SOB over the past 2 weeks. Per ED documentation, patient also complained of worsening bilateral leg swelling and pain.    CT angio negative for pulmonary edema/effusions, negative for PE.  Kindly review events from 19 August to set 7th in progress notes  Acute on chronic hypoxic and hypercapnia respiratory failure COPD  exacerbation congestive heart failure diastolic rhinovirus infection-- resolved morbid obesity suspected sleep apnea tobacco abuse -- patient now extubated on high flow nasal cannula oxygen -wean as tolerated- --BiPAP as needed -- patient remains a high risk for re-intubation -- patient is full code -- PRN bronchodilators -- completed IV steroids, IV antibiotic -- diuresis as BP and renal function permits  Acute on chronic diastolic heart failure pulmonary hypertension -- Echo shows EF 60 to 65%, grade 2 diastolic dysfunction, pulmonary hypertension -- rLasix held due to hypernatremia  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 Palliative care input appreciated. Patient remains a full code   Procedures:mechanical ventilator Family communication : Sister 21 August on the phone 03/16/2022 Consults : pal care CODE STATUS: full DVT Prophylaxis :arixtra Level of care: Progressive Status is: Inpatient Remains inpatient appropriate because: remains on high flow nasal cannula oxygen.     TOTAL TIME TAKING CARE OF THIS PATIENT: 35 minutes.  >50% time spent on counselling and coordination of care  Note: This dictation was prepared with Dragon dictation along with smaller phrase technology. Any transcriptional errors that result from this process are unintentional.  05/16/2022 M.D    [facility]   CC: Primary care physician; Pcp, No

## 2022-03-17 NOTE — TOC Progression Note (Addendum)
Transition of Care [facility]) - Progression Note    Patient Details  Name: Bryan Mitchell MRN: [id number] Date of Birth: Oct 07, 1960  [facility]) CM/SW Contact  Bing Quarry, RN Phone Number: 03/17/2022, 5:15 PM  Clinical Narrative:  9/10: 5 pm. One bed considering, all others remain in pending status. Gabriel Cirri RN CM      Expected Discharge Plan: Skilled Nursing Facility Barriers to Discharge: Continued Medical Work up  Expected Discharge Plan and Services Expected Discharge Plan: Skilled Nursing Facility   Discharge Planning Services: CM Consult Post Acute Care Choice: Skilled Nursing Facility Living arrangements for the past 2 months: Single Family Home                                       Social Determinants of Health (SDOH) Interventions    Readmission Risk Interventions     No data to display

## 2022-03-18 LAB — GLUCOSE, CAPILLARY
Glucose-Capillary: 110 mg/dL — ABNORMAL HIGH (ref 70–99)
Glucose-Capillary: 112 mg/dL — ABNORMAL HIGH (ref 70–99)
Glucose-Capillary: 117 mg/dL — ABNORMAL HIGH (ref 70–99)
Glucose-Capillary: 134 mg/dL — ABNORMAL HIGH (ref 70–99)
Glucose-Capillary: 136 mg/dL — ABNORMAL HIGH (ref 70–99)
Glucose-Capillary: 147 mg/dL — ABNORMAL HIGH (ref 70–99)
Glucose-Capillary: 148 mg/dL — ABNORMAL HIGH (ref 70–99)
Glucose-Capillary: 180 mg/dL — ABNORMAL HIGH (ref 70–99)

## 2022-03-18 MED ORDER — INSULIN ASPART 100 UNIT/ML IJ SOLN
0.0000 [IU] | Freq: Three times a day (TID) | INTRAMUSCULAR | Status: DC
  Administered 2022-03-19: 7 [IU] via SUBCUTANEOUS
  Administered 2022-03-19 (×2): 4 [IU] via SUBCUTANEOUS
  Administered 2022-03-20: 3 [IU] via SUBCUTANEOUS
  Administered 2022-03-20 (×3): 4 [IU] via SUBCUTANEOUS
  Administered 2022-03-21: 3 [IU] via SUBCUTANEOUS
  Administered 2022-03-21: 4 [IU] via SUBCUTANEOUS
  Administered 2022-03-21: 3 [IU] via SUBCUTANEOUS
  Administered 2022-03-22 – 2022-03-23 (×4): 4 [IU] via SUBCUTANEOUS
  Administered 2022-03-23: 3 [IU] via SUBCUTANEOUS
  Administered 2022-03-23: 4 [IU] via SUBCUTANEOUS
  Administered 2022-03-24 – 2022-03-25 (×5): 3 [IU] via SUBCUTANEOUS
  Administered 2022-03-25: 4 [IU] via SUBCUTANEOUS
  Administered 2022-03-26 (×3): 3 [IU] via SUBCUTANEOUS
  Administered 2022-03-27: 4 [IU] via SUBCUTANEOUS
  Administered 2022-03-27: 7 [IU] via SUBCUTANEOUS
  Filled 2022-03-18 (×29): qty 1

## 2022-03-18 MED ORDER — HALOPERIDOL LACTATE 5 MG/ML IJ SOLN
1.0000 mg | Freq: Four times a day (QID) | INTRAMUSCULAR | Status: DC | PRN
  Filled 2022-03-18: qty 1

## 2022-03-18 NOTE — Progress Notes (Signed)
Patient  confused keeps pointing to the wall asking writer if she see that thing on wall. Writer asked patient what is he seeing he states it real long but not a snake. Writer assure patient no snake or bug was on the wall or in the hall.

## 2022-03-18 NOTE — Progress Notes (Signed)
Occupational Therapy Treatment Patient Details Name: Bryan Mitchell MRN: [id number] DOB: 1961/03/13 Today's Date: 03/18/2022   History of present illness Patient is a 61 year old male with worsening acute on chronic hypoxic/hypercapnic respiratory failure requiring emergent intubation and mechanical ventilatory support. Suspected COPD exacerbation in the setting of HFpEF and possible pulmonary hypertension. Required intuatbion but is now extubated.   OT comments  Mr Kentner was seen for OT treatment on this date. Upon arrival to room pt reclined in bed, agreeable to tx. Pt pleasant and agreeable to sitting EOB, requires TOTAL A x2. In sitting pt becomes perseverative on getting up to pee - attempted to reorient to catheter and redirect however unable, ultimately removes O2 and attempts repeatedly to clear rear, assisted to bed TOTAL A x2. Pt requires SETUP + SUPERVISION self-drinking at bed level - cues for aspiration precautions. Completed seated BUE therex as described below. Pt making good progress toward goals, will continue to follow POC. Discharge recommendation remains appropriate.     Recommendations for follow up therapy are one component of a multi-disciplinary discharge planning process, led by the attending physician.  Recommendations may be updated based on patient status, additional functional criteria and insurance authorization.    Follow Up Recommendations  Skilled nursing-short term rehab (<3 hours/day)    Assistance Recommended at Discharge Frequent or constant Supervision/Assistance  Patient can return home with the following  Two people to help with walking and/or transfers;Two people to help with bathing/dressing/bathroom   Equipment Recommendations  Hospital bed    Recommendations for Other Services      Precautions / Restrictions Precautions Precautions: Fall Restrictions Weight Bearing Restrictions: No       Mobility Bed Mobility Overal bed mobility: Needs  Assistance Bed Mobility: Supine to Sit, Sit to Supine     Supine to sit: Total assist, +2 for physical assistance Sit to supine: Total assist, +2 for physical assistance        Transfers                   General transfer comment: minimally clears rear with standing attempts using rocking motions.     Balance Overall balance assessment: Needs assistance Sitting-balance support: Feet supported, Bilateral upper extremity supported Sitting balance-Leahy Scale: Poor                                     ADL either performed or assessed with clinical judgement   ADL Overall ADL's : Needs assistance/impaired                                       General ADL Comments: SETUP + SUPERVISION self-drinking at bed level - cues for aspiration precautions. MAX A don B socks seated EOB, +2 assist for static sitting balance      Cognition Arousal/Alertness: Awake/alert Behavior During Therapy: Impulsive Overall Cognitive Status: No family/caregiver present to determine baseline cognitive functioning Area of Impairment: Following commands, Safety/judgement, Problem solving                       Following Commands: Follows one step commands inconsistently, Follows one step commands with increased time Safety/Judgement: Decreased awareness of safety, Decreased awareness of deficits   Problem Solving: Slow processing, Decreased initiation, Difficulty sequencing, Requires verbal cues, Requires tactile cues General Comments:  redirection t/o for safety, pt removes O2 with poor insight        Exercises General Exercises - Upper Extremity Shoulder Flexion: Strengthening, Both, 5 reps, Supine, AROM Shoulder Extension: Strengthening, Both, 5 reps, Supine, AROM Shoulder Horizontal ABduction: Strengthening, Both, 5 reps, Supine, AROM Shoulder Horizontal ADduction: Strengthening, Both, 5 reps, Supine, AROM Elbow Flexion: Strengthening, Both, 5 reps,  Supine, AROM Elbow Extension: Strengthening, Both, 5 reps, Supine, AROM    Shoulder Instructions       General Comments with agitation bouts, patient pulls 02 off multiple times. with repositioning patient back to bed and distraction, patient is compliant with having 02 in place. Sp02 88% on 7 L02 after mobility. cues for breathing techniques provided and encouragement for incentive spirometer with carry over demonstrated.    Pertinent Vitals/ Pain       Pain Assessment Pain Assessment: No/denies pain   Frequency  Min 2X/week        Progress Toward Goals  OT Goals(current goals can now be found in the care plan section)  Progress towards OT goals: Progressing toward goals  Acute Rehab OT Goals Patient Stated Goal: to go home OT Goal Formulation: With patient Time For Goal Achievement: 03/26/22 Potential to Achieve Goals: Fair ADL Goals Pt Will Perform Grooming: with set-up;with supervision;bed level Pt Will Perform Upper Body Dressing: with mod assist Pt Will Transfer to Toilet: with min assist;with +2 assist Pt/caregiver will Perform Home Exercise Program: Increased ROM;Increased strength;Both right and left upper extremity  Plan Discharge plan remains appropriate;Frequency remains appropriate    Co-evaluation    PT/OT/SLP Co-Evaluation/Treatment: Yes Reason for Co-Treatment: Complexity of the patient's impairments (multi-system involvement);To address functional/ADL transfers;For patient/therapist safety;Necessary to address cognition/behavior during functional activity PT goals addressed during session: Mobility/safety with mobility OT goals addressed during session: ADL's and self-care      AM-PAC OT "6 Clicks" Daily Activity     Outcome Measure   Help from another person eating meals?: A Little Help from another person taking care of personal grooming?: A Little Help from another person toileting, which includes using toliet, bedpan, or urinal?: A Lot Help  from another person bathing (including washing, rinsing, drying)?: A Lot Help from another person to put on and taking off regular upper body clothing?: A Lot Help from another person to put on and taking off regular lower body clothing?: A Lot 6 Click Score: 14    End of Session    OT Visit Diagnosis: Other abnormalities of gait and mobility (R26.89);Muscle weakness (generalized) (M62.81)   Activity Tolerance Patient tolerated treatment well;Patient limited by fatigue   Patient Left in bed;with call bell/phone within reach   Nurse Communication Mobility status        Time: 1006-1030 OT Time Calculation (min): 24 min  Charges: OT General Charges $OT Visit: 1 Visit OT Treatments $Self Care/Home Management : 8-22 mins  Kathie Dike, M.S. OTR/L  03/18/22, 1:06 PM  ascom [phone]

## 2022-03-18 NOTE — Progress Notes (Signed)
Physical Therapy Treatment Patient Details Name: Bryan Mitchell MRN: [id number] DOB: February 08, 1961 Today's Date: 03/18/2022   History of Present Illness Patient is a 61 year old male with worsening acute on chronic hypoxic/hypercapnic respiratory failure requiring emergent intubation and mechanical ventilatory support. Suspected COPD exacerbation in the setting of HFpEF and possible pulmonary hypertension. Required intuatbion but is now extubated.   PT Comments    Patient has periods of agitation during mobility with pulling off oxygen and attempting to stand unassisted. Redirection and distraction required with success. The patient continues to require extensive +2 person assistance with bed mobility. Sitting balance has improved since last session with increased postural awareness and righting reactions, however he continues to require physical assistance to maintain midline. Activity tolerance limited by fatigue. Recommend to continue PT to maximize independence and decrease caregiver burden. SNF recommended at discharge.    Recommendations for follow up therapy are one component of a multi-disciplinary discharge planning process, led by the attending physician.  Recommendations may be updated based on patient status, additional functional criteria and insurance authorization.  Follow Up Recommendations  Skilled nursing-short term rehab (<3 hours/day) Can patient physically be transported by private vehicle: No   Assistance Recommended at Discharge Frequent or constant Supervision/Assistance  Patient can return home with the following Two people to help with walking and/or transfers;Two people to help with bathing/dressing/bathroom;Help with stairs or ramp for entrance;Assist for transportation;Assistance with feeding;Direct supervision/assist for medications management;Direct supervision/assist for financial management;Assistance with cooking/housework   Equipment Recommendations  Other  (comment) (to bed determined at next level of care)    Recommendations for Other Services       Precautions / Restrictions Precautions Precautions: Fall Restrictions Weight Bearing Restrictions: No     Mobility  Bed Mobility Overal bed mobility: Needs Assistance Bed Mobility: Supine to Sit, Sit to Supine     Supine to sit: Total assist, +2 for physical assistance Sit to supine: Total assist, +2 for physical assistance   General bed mobility comments: patient has difficulty with task initiation despite verbal cues and faciliation provided. +2 perosn assistance required    Transfers Overall transfer level: Needs assistance                 General transfer comment: patient impulsively attempted to stand multiple times on his own but unable. due to agitation while sitting up, further standing efforts were not safe to attempt. frequent redirection required as patient seems agitated that he cannot get up and walk to the bathroom with decreased awareness of his deficits    Ambulation/Gait                   Stairs             Wheelchair Mobility    Modified Rankin (Stroke Patients Only)       Balance Overall balance assessment: Needs assistance Sitting-balance support: Feet supported, Bilateral upper extremity supported Sitting balance-Leahy Scale: Zero Sitting balance - Comments: patient required up to maximal assistance to maintain sitting balance but does have increased awareness of loss of balance with attempts to hold midline when challanged, cues provided for technique and increased awareness. sitting balance support varies from zero to poor                                    Cognition Arousal/Alertness: Awake/alert Behavior During Therapy: Agitated, Impulsive (intermittent agitation with mobility efforts, otherwise confused  but cooperative) Overall Cognitive Status: No family/caregiver present to determine baseline cognitive  functioning Area of Impairment: Following commands, Safety/judgement, Problem solving                 Orientation Level: Disoriented to, Time, Situation Current Attention Level: Focused   Following Commands: Follows one step commands inconsistently, Follows one step commands with increased time Safety/Judgement: Decreased awareness of safety, Decreased awareness of deficits   Problem Solving: Slow processing, Decreased initiation, Difficulty sequencing, Requires verbal cues, Requires tactile cues General Comments: frequent redirection required        Exercises      General Comments General comments (skin integrity, edema, etc.): with agitation bouts, patient pulls 02 off multiple times. with repositioning patient back to bed and distraction, patient is compliant with having 02 in place. Sp02 88% on 7 L02 after mobility. cues for breathing techniques provided and encouragement for incentive spirometer with carry over demonstrated.      Pertinent Vitals/Pain Pain Assessment Pain Assessment: No/denies pain    Home Living                          Prior Function            PT Goals (current goals can now be found in the care plan section) Acute Rehab PT Goals Patient Stated Goal: unable to state PT Goal Formulation: Patient unable to participate in goal setting Time For Goal Achievement: 03/28/22 Potential to Achieve Goals: Poor Progress towards PT goals: Progressing toward goals    Frequency    Min 2X/week      PT Plan Current plan remains appropriate    Co-evaluation PT/OT/SLP Co-Evaluation/Treatment: Yes Reason for Co-Treatment: Complexity of the patient's impairments (multi-system involvement);To address functional/ADL transfers;Necessary to address cognition/behavior during functional activity PT goals addressed during session: Mobility/safety with mobility        AM-PAC PT "6 Clicks" Mobility   Outcome Measure  Help needed turning from  your back to your side while in a flat bed without using bedrails?: Total Help needed moving from lying on your back to sitting on the side of a flat bed without using bedrails?: Total Help needed moving to and from a bed to a chair (including a wheelchair)?: Total Help needed standing up from a chair using your arms (e.g., wheelchair or bedside chair)?: Total Help needed to walk in hospital room?: Total Help needed climbing 3-5 steps with a railing? : Total 6 Click Score: 6    End of Session Equipment Utilized During Treatment: Oxygen Activity Tolerance: Treatment limited secondary to agitation;Patient limited by fatigue Patient left: in bed;with call bell/phone within reach;with bed alarm set;with SCD's reapplied Nurse Communication: Mobility status PT Visit Diagnosis: Unsteadiness on feet (R26.81);Muscle weakness (generalized) (M62.81)     Time: 6195-0932 PT Time Calculation (min) (ACUTE ONLY): 23 min  Charges:  $Therapeutic Activity: 8-22 mins                     Donna Bernard, PT, MPT   Ina Homes 03/18/2022, 11:34 AM

## 2022-03-18 NOTE — TOC Progression Note (Signed)
Transition of Care [facility]) - Progression Note    Patient Details  Name: Jaxx Huish MRN: [id number] Date of Birth: 1961-04-20  Transition of Care [facility]) CM/SW Contact  Margarito Liner, LCSW Phone Number: 03/18/2022, 1:28 PM  Clinical Narrative:   Called sister, Lupita Leash. Provided update on bed search, explained LOG. Told her MD asked PT/OT to see him daily. Patient had Medicaid until July and sister is working on getting it reactivated and she has been in contact with [employer] about this. Sister asked about getting disability. Relayed this to [employer] and [employer].   Expected Discharge Plan: Skilled Nursing Facility Barriers to Discharge: Continued Medical Work up  Expected Discharge Plan and Services Expected Discharge Plan: Skilled Nursing Facility   Discharge Planning Services: CM Consult Post Acute Care Choice: Skilled Nursing Facility Living arrangements for the past 2 months: Single Family Home                                       Social Determinants of Health (SDOH) Interventions    Readmission Risk Interventions     No data to display

## 2022-03-18 NOTE — Progress Notes (Addendum)
Speech Language Pathology Treatment: Dysphagia  Patient Details Name: Bryan Mitchell MRN: [id number] DOB: [birth year] Today's Date: 03/18/2022 Time: 6720-9470 SLP Time Calculation (min) (ACUTE ONLY): 20 min  Assessment / Plan / Recommendation Clinical Impression  Pt seen for follow up dysphagia intervention. Pt currently on 7L O2 via nasal canula (though pt had removed nasal canula upon entrance to room). Pt assisted with oral care and repositioning in bed for more upright positioning. Trials completed of thin liquids via straw and Dys 2 (chopped solids). Pt w/o s/sx of aspiration with trials. Pt with notably impulsive with intake, with consistent verbal cues and removal of cup from oral cavity reducing rate and bolus size. Pt demonstrating complete oral manipulation and clearance with Dys 2 solids. Liquid provided to ensure oral clearance. Cognition continues to limit pt's attention to task for eating and awareness of bolus in oral cavity. Recommend advancement to Dys 2 and thin liquids with aspiration precautions (slow rate, small bites, elevated HOB, and alert for PO intake). Assist for meals. SLP will continue to follow in house. Follow up speech therapy at next level of care expected.   HPI HPI: Patient is a 61 year old male with worsening acute on chronic hypoxic/hypercapnic respiratory failure requiring emergent intubation and mechanical ventilatory support. Suspected COPD exacerbation in the setting of HFpEF and possible pulmonary hypertension. Required intuatbion but is now extubated. Pt is currently on Dys 1 and thin liquids. Pt on 7L nasal canula.      SLP Plan  Continue with current plan of care      Recommendations for follow up therapy are one component of a multi-disciplinary discharge planning process, led by the attending physician.  Recommendations may be updated based on patient status, additional functional criteria and insurance authorization.    Recommendations  Diet  recommendations: Dysphagia 2 (fine chop);Thin liquid Liquids provided via: Cup;Straw Medication Administration: Crushed with puree Supervision: Full supervision/cueing for compensatory strategies Compensations: Minimize environmental distractions;Slow rate;Small sips/bites;Follow solids with liquid Postural Changes and/or Swallow Maneuvers: Seated upright 90 degrees                Oral Care Recommendations: Oral care BID Follow Up Recommendations: Skilled nursing-short term rehab (<3 hours/day) Assistance recommended at discharge: Frequent or constant Supervision/Assistance SLP Visit Diagnosis: Dysphagia, oropharyngeal phase (R13.12) Plan: Continue with current plan of care         Bryan Catalea Labrecque Clapp  MS CCC-SLP  Bryan Mitchell  03/18/2022, 12:27 PM

## 2022-03-18 NOTE — Progress Notes (Signed)
[facility]  - [place] at [facility]   PATIENT NAME: Bryan Mitchell    MR#:  [id number]  DATE OF BIRTH:  [birth year]  SUBJECTIVE:  no family at bedside.  Patient has been admitted with acute metabolic encephalopathy with respiratory distress placed on mechanical ventilator. Extubated now on flow nasal cannula. Poor lung volumes. Morbidly obese likely has underlying sleep apnea.  More alert today. Improving with speech eval. Will remove foley today  VITALS:  Blood pressure 137/84, pulse 89, temperature 98.4 F (36.9 C), resp. rate 19, height 5\' 8"  (1.727 m), weight 131.1 kg, SpO2 92 %.  PHYSICAL EXAMINATION:   GENERAL:  61 y.o.-year-old patient lying in the bed with no acute distress. Morbid obesity LUNGS: decreased breath sounds bilaterally, no wheezing, rales, rhonchi. High flow nasal cannula oxygen CARDIOVASCULAR: S1, S2 normal. No murmurs, rubs, or gallops.  ABDOMEN: Soft, nontender, nondistended. Bowel sounds present. Foley present + EXTREMITIES: +edema NEUROLOGIC: nonfocal  patient is alert and awake SKIN: No obvious rash, lesion, or ulcer.   LABORATORY PANEL:  CBC Recent Labs  Lab 03/16/22 0419  WBC 7.5  HGB 17.4*  HCT 57.8*  PLT 354     Chemistries  Recent Labs  Lab 03/17/22 0625  NA 144  K 3.8  CL 107  CO2 30  GLUCOSE 120*  BUN 25*  CREATININE 0.64  CALCIUM 9.6  MG 1.9    Cardiac Enzymes No results for input(s): "TROPONINI" in the last 168 hours. RADIOLOGY:  No results found.  Assessment and Plan 61 yo M presenting to [facility] ED on 02/23/22 from home in a wheelchair with his sister for evaluation of worsening fatigue, poor appetite and SOB over the past 2 weeks. Per ED documentation, patient also complained of worsening bilateral leg swelling and pain.    CT angio negative for pulmonary edema/effusions, negative for PE.  Kindly review events from 19 August to set 7th in progress notes  Acute on chronic hypoxic and hypercapnia  respiratory failure COPD exacerbation congestive heart failure diastolic rhinovirus infection-- resolved morbid obesity suspected sleep apnea tobacco abuse -- patient now extubated on high flow nasal cannula oxygen -wean as tolerated- still on HFNC 7 liters --BiPAP as needed -- patient remains a high risk for re-intubation -- patient is full code -- PRN bronchodilators -- completed IV steroids, IV antibiotic -- diuresis as BP and renal function permits  Acute on chronic diastolic heart failure pulmonary hypertension -- Echo shows EF 60 to 65%, grade 2 diastolic dysfunction, pulmonary hypertension -- rLasix held due to hypernatremia  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 Palliative care input appreciated. Patient remains a full code   Procedures:mechanical ventilator Family communication : Sister 21 August on the phone 03/16/2022 Consults : pal care CODE STATUS: full DVT Prophylaxis :arixtra Level of care: Progressive Status is: Inpatient Remains inpatient appropriate because: remains on high flow nasal cannula oxygen.     TOTAL TIME TAKING CARE OF THIS PATIENT: 35 minutes.  >50% time spent on counselling and coordination of care  Note: This dictation was prepared with Dragon dictation along with smaller phrase technology. Any transcriptional errors that result from this process are unintentional.  05/16/2022 M.D    [facility]  CC: Primary care physician; Pcp, No

## 2022-03-18 NOTE — Progress Notes (Signed)
Patient keeps pulling off oxygen. He is refusing to put it back on oxygen level 86-89% on room air. Patient appears agitated cursing at staff. Patient unable to void post foley removal bladder scan showed 333 ml Dr. Allena Katz notified new orders received.

## 2022-03-19 LAB — GLUCOSE, CAPILLARY
Glucose-Capillary: 170 mg/dL — ABNORMAL HIGH (ref 70–99)
Glucose-Capillary: 209 mg/dL — ABNORMAL HIGH (ref 70–99)
Glucose-Capillary: 170 mg/dL — ABNORMAL HIGH (ref 70–99)
Glucose-Capillary: 113 mg/dL — ABNORMAL HIGH (ref 70–99)
Glucose-Capillary: 156 mg/dL — ABNORMAL HIGH (ref 70–99)

## 2022-03-19 MED ORDER — QUETIAPINE FUMARATE 25 MG PO TABS
25.0000 mg | ORAL_TABLET | Freq: Every day | ORAL | Status: DC
  Administered 2022-03-19 – 2022-03-26 (×8): 25 mg via ORAL
  Filled 2022-03-19 (×8): qty 1

## 2022-03-19 NOTE — Progress Notes (Signed)
[facility]  - [place] at [facility]   PATIENT NAME: Bryan Mitchell    MR#:  [id number]  DATE OF BIRTH:  1960-11-05  SUBJECTIVE:  no family at bedside.  Patient has been admitted with acute metabolic encephalopathy with respiratory distress placed on mechanical ventilator. Extubated now on flow nasal cannula. Poor lung volumes. Morbidly obese likely has underlying sleep apnea.  PT/OT did excellent work getting him to the chair first time.  Some intermittent confusion/agitation  Had to do In and out yday due to bladder scan showing 365cc  VITALS:  Blood pressure 105/79, pulse (!) 108, temperature 98 F (36.7 C), resp. rate 18, height 5\' 8"  (1.727 m), weight 130.5 kg, SpO2 98 %.  PHYSICAL EXAMINATION:   GENERAL:  61 y.o.-year-old patient lying in the bed with no acute distress. Morbid obesity LUNGS: decreased breath sounds bilaterally, no wheezing, rales, rhonchi. High flow nasal cannula oxygen CARDIOVASCULAR: S1, S2 normal. No murmurs, rubs, or gallops.  ABDOMEN: Soft, nontender, nondistended. Bowel sounds present.  EXTREMITIES: +edema NEUROLOGIC: nonfocal  patient is alert and awake SKIN: No obvious rash, lesion, or ulcer.   LABORATORY PANEL:  CBC Recent Labs  Lab 03/16/22 0419  WBC 7.5  HGB 17.4*  HCT 57.8*  PLT 354     Chemistries  Recent Labs  Lab 03/17/22 0625  NA 144  K 3.8  CL 107  CO2 30  GLUCOSE 120*  BUN 25*  CREATININE 0.64  CALCIUM 9.6  MG 1.9    Cardiac Enzymes No results for input(s): "TROPONINI" in the last 168 hours. RADIOLOGY:  No results found.  Assessment and Plan 61 yo M presenting to [facility] ED on 02/23/22 from home in a wheelchair with his sister for evaluation of worsening fatigue, poor appetite and SOB over the past 2 weeks. Per ED documentation, patient also complained of worsening bilateral leg swelling and pain.    CT angio negative for pulmonary edema/effusions, negative for PE.  Kindly review events from 19  August to set 7th in progress notes  Acute on chronic hypoxic and hypercapnia respiratory failure COPD exacerbation congestive heart failure diastolic rhinovirus infection-- resolved morbid obesity suspected sleep apnea tobacco abuse -- patient now extubated on high flow nasal cannula oxygen -wean as tolerated- still on HFNC 7 liters --BiPAP as needed -- patient remains a high risk for re-intubation -- patient is full code -- PRN bronchodilators -- completed IV steroids, IV antibiotic -- diuresis as BP and renal function permits  Acute on chronic diastolic heart failure pulmonary hypertension -- Echo shows EF 60 to 65%, grade 2 diastolic dysfunction, pulmonary hypertension -- rLasix held due to hypernatremia  Polycythemia secondary to chronic hypoxia --stable  Type II diabetes with morbid obesity -- hemoglobin A1c 6.5 -- continue Insulin Semglee and aspart  Generalized weakness, morbid obesity, wheelchair-bound -- will have physical therapy see patient--recommends rehab -- TOC for discharge planning  Urinary retention with extended period of foley use given acuity of illness --foley removed 03/18/22--already had to do in and out yday --cont flomax --may need to re-insert if pt has issues with retention  Intermittent confusion with agitation/?underlying cognitive decline vs hospital delirium (No mention of such in PMH) --will start po seroquel daily  Patient overall has a poor prognosis.  Palliative care input appreciated.  Patient remains a full code   Procedures:mechanical ventilator Family communication : Sister 05/18/22 on the phone  Consults : pal care CODE STATUS: full DVT Prophylaxis :arixtra Level of care: Progressive Status is:  Inpatient Remains inpatient appropriate because: remains on high flow nasal cannula oxygen.     TOTAL TIME TAKING CARE OF THIS PATIENT: 35 minutes.  >50% time spent on counselling and coordination of care  Note: This dictation  was prepared with Dragon dictation along with smaller phrase technology. Any transcriptional errors that result from this process are unintentional.  Enedina Finner M.D    [facility]   CC: Primary care physician; Pcp, No

## 2022-03-19 NOTE — Plan of Care (Signed)
  Problem: Education: Goal: Knowledge of General Education information will improve Description: Including pain rating scale, medication(s)/side effects and non-pharmacologic comfort measures Outcome: Progressing   Problem: Health Behavior/Discharge Planning: Goal: Ability to manage health-related needs will improve Outcome: Progressing   Problem: Clinical Measurements: Goal: Ability to maintain clinical measurements within normal limits will improve Outcome: Progressing Goal: Will remain free from infection Outcome: Progressing Goal: Diagnostic test results will improve Outcome: Progressing Goal: Respiratory complications will improve Outcome: Progressing Goal: Cardiovascular complication will be avoided Outcome: Progressing   Problem: Activity: Goal: Risk for activity intolerance will decrease Outcome: Progressing   Problem: Nutrition: Goal: Adequate nutrition will be maintained Outcome: Progressing   Problem: Coping: Goal: Level of anxiety will decrease Outcome: Progressing   Problem: Elimination: Goal: Will not experience complications related to bowel motility Outcome: Progressing Goal: Will not experience complications related to urinary retention Outcome: Progressing   Problem: Pain Managment: Goal: General experience of comfort will improve Outcome: Progressing   Problem: Safety: Goal: Ability to remain free from injury will improve Outcome: Progressing   Problem: Skin Integrity: Goal: Risk for impaired skin integrity will decrease Outcome: Progressing   Problem: Education: Goal: Ability to describe self-care measures that may prevent or decrease complications (Diabetes Survival Skills Education) will improve Outcome: Progressing   Problem: Coping: Goal: Ability to adjust to condition or change in health will improve Outcome: Progressing   Problem: Fluid Volume: Goal: Ability to maintain a balanced intake and output will improve Outcome:  Progressing   Problem: Health Behavior/Discharge Planning: Goal: Ability to identify and utilize available resources and services will improve Outcome: Progressing Goal: Ability to manage health-related needs will improve Outcome: Progressing   Problem: Metabolic: Goal: Ability to maintain appropriate glucose levels will improve Outcome: Progressing   Problem: Nutritional: Goal: Maintenance of adequate nutrition will improve Outcome: Progressing Goal: Progress toward achieving an optimal weight will improve Outcome: Progressing   Problem: Skin Integrity: Goal: Risk for impaired skin integrity will decrease Outcome: Progressing   Problem: Tissue Perfusion: Goal: Adequacy of tissue perfusion will improve Outcome: Progressing   

## 2022-03-19 NOTE — Progress Notes (Signed)
Occupational Therapy Treatment Patient Details Name: Bryan Mitchell MRN: [id number] DOB: Aug 10, 1960 Today's Date: 03/19/2022   History of present illness Patient is a 61 year old male with worsening acute on chronic hypoxic/hypercapnic respiratory failure requiring emergent intubation and mechanical ventilatory support. Suspected COPD exacerbation in the setting of HFpEF and possible pulmonary hypertension. Required intuatbion but is now extubated.   OT comments  Bryan Mitchell was seen for OT treatment on this date. Upon arrival to room pt rolling onto side attempting to exit bed, O2 removed, agreeable to tx. MD present t/o session. Pt requires MAX A don B socks at bed level. MAX A x2 sup>sit.  CGA self-drinking seated EOB. Pt perseverative on standing without assist however ultimately requires MAX A x2 for standing trials, poor tolerance. MAX A x2 lateral scoot t/f bed> chair. Pt making good progress toward goals, will continue to follow POC. Discharge recommendation remains appropriate.     Recommendations for follow up therapy are one component of a multi-disciplinary discharge planning process, led by the attending physician.  Recommendations may be updated based on patient status, additional functional criteria and insurance authorization.    Follow Up Recommendations  Skilled nursing-short term rehab (<3 hours/day)    Assistance Recommended at Discharge Frequent or constant Supervision/Assistance  Patient can return home with the following  Two people to help with walking and/or transfers;Two people to help with bathing/dressing/bathroom   Equipment Recommendations  Hospital bed    Recommendations for Other Services      Precautions / Restrictions Precautions Precautions: Fall Restrictions Weight Bearing Restrictions: No       Mobility Bed Mobility Overal bed mobility: Needs Assistance Bed Mobility: Supine to Sit     Supine to sit: Max assist, +2 for physical assistance           Transfers Overall transfer level: Needs assistance Equipment used: 2 person hand held assist Transfers: Sit to/from Stand, Bed to chair/wheelchair/BSC Sit to Stand: Max assist, +2 physical assistance          Lateral/Scoot Transfers: Max assist, +2 physical assistance General transfer comment: multiple transfers performed. sit to stand from the bed x 2 bouts with max A +2 person. patient was unable to stand long enough or sequence pivoting to the chair. lateral scoot transfer performed from bed to recliner chair with maximal cues for technique. for routine mobility, recommend to use the lift for OOB activity     Balance Overall balance assessment: Needs assistance Sitting-balance support: Feet supported, Bilateral upper extremity supported Sitting balance-Leahy Scale: Fair Sitting balance - Comments: close stand by assistance provided due impulsive behavior   Standing balance support: Single extremity supported, Bilateral upper extremity supported Standing balance-Leahy Scale: Zero Standing balance comment: B knee block                           ADL either performed or assessed with clinical judgement   ADL Overall ADL's : Needs assistance/impaired                                       General ADL Comments: MAX A don B socks at bed level. CGA self-drinking seated EOB.      Cognition Arousal/Alertness: Awake/alert Behavior During Therapy: Impulsive Overall Cognitive Status: No family/caregiver present to determine baseline cognitive functioning Area of Impairment: Following commands, Safety/judgement, Problem solving  Following Commands: Follows one step commands inconsistently, Follows one step commands with increased time Safety/Judgement: Decreased awareness of safety, Decreased awareness of deficits   Problem Solving: Slow processing, Decreased initiation, Difficulty sequencing, Requires verbal cues,  Requires tactile cues General Comments: perseverative on completing mobility without assistance stating "let me try it" with poor safety awareness. requires redirection                   Pertinent Vitals/ Pain       Pain Assessment Pain Assessment: Faces Faces Pain Scale: Hurts a little bit Pain Location: distal LLE Pain Descriptors / Indicators: Discomfort Pain Intervention(s): Limited activity within patient's tolerance   Frequency  Min 2X/week        Progress Toward Goals  OT Goals(current goals can now be found in the care plan section)  Progress towards OT goals: Progressing toward goals  Acute Rehab OT Goals Patient Stated Goal: to walk OT Goal Formulation: With patient Time For Goal Achievement: 03/26/22 Potential to Achieve Goals: Fair ADL Goals Pt Will Perform Grooming: with set-up;with supervision;bed level Pt Will Perform Upper Body Dressing: with mod assist Pt Will Transfer to Toilet: with min assist;with +2 assist Pt/caregiver will Perform Home Exercise Program: Increased ROM;Increased strength;Both right and left upper extremity  Plan Discharge plan remains appropriate;Frequency remains appropriate    Co-evaluation    PT/OT/SLP Co-Evaluation/Treatment: Yes Reason for Co-Treatment: For patient/therapist safety;To address functional/ADL transfers PT goals addressed during session: Mobility/safety with mobility        AM-PAC OT "6 Clicks" Daily Activity     Outcome Measure   Help from another person eating meals?: A Little Help from another person taking care of personal grooming?: A Little Help from another person toileting, which includes using toliet, bedpan, or urinal?: A Lot Help from another person bathing (including washing, rinsing, drying)?: A Lot Help from another person to put on and taking off regular upper body clothing?: A Lot Help from another person to put on and taking off regular lower body clothing?: A Lot 6 Click Score:  14    End of Session    OT Visit Diagnosis: Other abnormalities of gait and mobility (R26.89);Muscle weakness (generalized) (M62.81)   Activity Tolerance Patient tolerated treatment well;Patient limited by fatigue   Patient Left in chair;with call bell/phone within reach;with chair alarm set   Nurse Communication Mobility status;Need for lift equipment        Time: 3154-0086 OT Time Calculation (min): 27 min  Charges: OT General Charges $OT Visit: 1 Visit OT Treatments $Self Care/Home Management : 8-22 mins  Kathie Dike, M.S. OTR/L  03/19/22, 1:06 PM  ascom [phone]

## 2022-03-19 NOTE — Progress Notes (Addendum)
Physical Therapy Treatment Patient Details Name: Bryan Mitchell MRN: [id number] DOB: [birth year] Today's Date: 03/19/2022   History of Present Illness Patient is a 61 year old male with worsening acute on chronic hypoxic/hypercapnic respiratory failure requiring emergent intubation and mechanical ventilatory support. Suspected COPD exacerbation in the setting of HFpEF and possible pulmonary hypertension. Required intuatbion but is now extubated.    PT Comments    Patient was seen with OT to maximize outcomes. Dr Allena Katz was also present throughout the session to see mobility status first hand. The patient is confused at times with intermittent agitation and requires redirection for safe participation with mobility. The patient required +2 person for bed mobility. Fair sitting balance today. Multiple standing bouts performed from bed with +2 person assistance. The patient was unable to stand long enough with difficulty sequencing for stand pivot transfer to chair. Patient was able to get to the chair with an incremental scoot transfer technique from bed to recliner chair drop arm down with +2 person assistance.  Recommend to use the lift to get back to bed and for routine OOB mobility. Recommend continued PT follow up to maximize independence and decrease caregiver burden. SNF is recommended at discharge.    Recommendations for follow up therapy are one component of a multi-disciplinary discharge planning process, led by the attending physician.  Recommendations may be updated based on patient status, additional functional criteria and insurance authorization.  Follow Up Recommendations  Skilled nursing-short term rehab (<3 hours/day) Can patient physically be transported by private vehicle: No   Assistance Recommended at Discharge Frequent or constant Supervision/Assistance  Patient can return home with the following Two people to help with walking and/or transfers;Two people to help with  bathing/dressing/bathroom;Help with stairs or ramp for entrance;Assist for transportation;Assistance with feeding;Direct supervision/assist for medications management;Direct supervision/assist for financial management;Assistance with cooking/housework   Equipment Recommendations   (to be determined at next level of care)    Recommendations for Other Services       Precautions / Restrictions Precautions Precautions: Fall Restrictions Weight Bearing Restrictions: No     Mobility  Bed Mobility Overal bed mobility: Needs Assistance Bed Mobility: Supine to Sit     Supine to sit: Max assist, +2 for physical assistance     General bed mobility comments: verbal and tactile cues for mobility technique. assistance for BLE and trunk support required    Transfers Overall transfer level: Needs assistance Equipment used: 2 person hand held assist Transfers: Sit to/from Stand, Bed to chair/wheelchair/BSC Sit to Stand: Max assist, +2 physical assistance          Lateral/Scoot Transfers: Max assist, +2 physical assistance (from bed to chair with recliner chair drop arm in the down position) General transfer comment: multiple transfers performed. sit to stand from the bed x 2 bouts with max A +2 person. patient was unable to stand long enough or sequence pivoting to the chair. lateral scoot transfer performed from bed to recliner chair with maximal cues for technique. for routine mobility, recommend to use the lift for OOB activity    Ambulation/Gait               General Gait Details: unable to at this time   Stairs             Wheelchair Mobility    Modified Rankin (Stroke Patients Only)       Balance Overall balance assessment: Needs assistance Sitting-balance support: Feet supported, Bilateral upper extremity supported Sitting balance-Leahy Scale: Fair Sitting  balance - Comments: close stand by assistance provided due impulsive behavior   Standing balance  support: Single extremity supported, Bilateral upper extremity supported Standing balance-Leahy Scale: Zero Standing balance comment: external support provided                            Cognition Arousal/Alertness: Awake/alert Behavior During Therapy: Impulsive Overall Cognitive Status: No family/caregiver present to determine baseline cognitive functioning Area of Impairment: Following commands, Safety/judgement, Problem solving                       Following Commands: Follows one step commands inconsistently, Follows one step commands with increased time Safety/Judgement: Decreased awareness of safety, Decreased awareness of deficits   Problem Solving: Slow processing, Decreased initiation, Difficulty sequencing, Requires verbal cues, Requires tactile cues General Comments: confusion and agitation throughout session. patient can be redirected to participate safely with mobility.        Exercises      General Comments General comments (skin integrity, edema, etc.): Dr Allena Katz was present for the session to see first hand how patient is mobilizing. oxygen was off on arrival to the room. patient was agreeable to put the 02 back in place with encouragement. Sp02 88% on room air and 90% with 02      Pertinent Vitals/Pain Pain Assessment Pain Assessment: Faces Faces Pain Scale: Hurts a little bit Pain Location: distal LLE Pain Descriptors / Indicators: Discomfort Pain Intervention(s): Limited activity within patient's tolerance, Monitored during session    Home Living                          Prior Function            PT Goals (current goals can now be found in the care plan section) Acute Rehab PT Goals Patient Stated Goal: to have a bowel movement PT Goal Formulation: With patient Time For Goal Achievement: 03/28/22 Potential to Achieve Goals: Poor Progress towards PT goals: Progressing toward goals    Frequency    Min 2X/week       PT Plan Current plan remains appropriate    Co-evaluation PT/OT/SLP Co-Evaluation/Treatment: Yes Reason for Co-Treatment: Complexity of the patient's impairments (multi-system involvement);For patient/therapist safety;To address functional/ADL transfers PT goals addressed during session: Mobility/safety with mobility        AM-PAC PT "6 Clicks" Mobility   Outcome Measure  Help needed turning from your back to your side while in a flat bed without using bedrails?: Total Help needed moving from lying on your back to sitting on the side of a flat bed without using bedrails?: Total Help needed moving to and from a bed to a chair (including a wheelchair)?: Total Help needed standing up from a chair using your arms (e.g., wheelchair or bedside chair)?: Total Help needed to walk in hospital room?: Total Help needed climbing 3-5 steps with a railing? : Total 6 Click Score: 6    End of Session Equipment Utilized During Treatment: Oxygen Activity Tolerance: Patient limited by fatigue Patient left: in chair;with call bell/phone within reach;with chair alarm set (falls mat in place in front of the chair, LE reclined) Nurse Communication: Mobility status;Need for lift equipment (group message via secure chat with PT, OT, RN, MD) PT Visit Diagnosis: Unsteadiness on feet (R26.81);Muscle weakness (generalized) (M62.81)     Time: 8099-8338 PT Time Calculation (min) (ACUTE ONLY): 29 min  Charges:  $  Therapeutic Activity: 8-22 mins                     Donna Bernard, PT, MPT    Ina Homes 03/19/2022, 12:07 PM

## 2022-03-19 NOTE — Progress Notes (Signed)
Speech Language Pathology Treatment: Dysphagia  Patient Details Name: Bryan Mitchell MRN: [id number] DOB: 04-09-1961 Today's Date: 03/19/2022 Time: 2353-6144 SLP Time Calculation (min) (ACUTE ONLY): 35 min  Assessment / Plan / Recommendation Clinical Impression  Pt seen for ongoing therapy today; trials to assess toleration for upgrade of oral diet. Pt awake, much more verbal though continues w/ decreased awareness of situation/self; easily distracted w/ Tangential speech. More intentional engagement w/ Therapist during po trials -- held carton and food for feeding self. Mumbled/muttered speech at times. Family not present at this session.  On [place] O2 7L. Afebrile; WBC WNL.  Pt required FULL encouragement and positioning assistance to SIT UPRIGHT for oral intake.    Pt appears to present w/ much improved oral phase management w/ oral intake in setting of declined Cognitive status; declined Mental Status; no pharyngeal phase deficits noted during po trails. Suspect impact from illness and MRI showing No acute intracranial abnormality, BUT, Chronic microvascular ischemic disease for age. ANY Cognitive/Mental status decline can impact overall awareness/timing of swallow and safety during po tasks which increases risk for aspiration, choking.  Pt's risk for aspiration is deemed reduced at this time w/ oral intake when following aspiration precautions and given feeding support/supervision at meals as needed.          Pt consumed trials of thin liquids via straw feeding self; then trials of Regular foods(graham crackers in pudding) w/ No immediate, overt clinical s/s of aspiration noted: no decline in vocal quality; no cough nor throat clearing. No decline in respiratory status during/post trials. O2 sats remained at his baseline of 95%.  Oral phase was c/b improved and timely bolus mastication, management, and timely A-P transfer w/ bolus cohesion -- much improved since initial eval. He continues to be  impacted by distraction and talking during po trials today -- TV shut off and door closed during session to reduce distractions. Much improved awareness w/ less distraction w/ po trials, eating/drinking this session. HOWEVER, noted pt lying reclined in bed w/ lunch tray untouched next to him(not in a supported position for eating/drinking). Pt stated he did not eat the lunch meal b/c he "wanted to lose weight". Will alert the Dietician and MD.   Pt was able to feed himself w/ setup and boluses prepped for him -- able to use UEs to hold Cup/finger foods himself. Verbal/visual/tactile cues given intermittently d/t the Cognitive decline and being distracted easily.      In setting of current presentation, recommend a Regular diet (cut meats, no salads) diet w/ Thin liquids. Aspiration precautions as posted and reviewed w/ pt. Support and Supervision to monitor follow through w/ aspiration precautions during meals. Assist w/ oral care for hygiene.     ST services will monitor toleration of diet next 1-2 days. Recommend ongoing support of Dietician, Team.      HPI HPI: Pt is a 61 yo M presenting to [facility] ED on 02/23/22 from home for evaluation of worsening fatigue, poor appetite and SOB over the past 2 weeks. Per ED documentation, patient also complained of worsening bilateral leg swelling and pain. He started having increased congestion and poor PO intake also. On the night of 8/18 the sister spoke with the patient on the phone. Pt  told her, "I feel like I'm dying". On 8/19, she convinced the patient to go to the hospital to be evaluated. She reports he does not trust doctors and has difficulty following outpatient medication regimens. He was prescribed home O2, and has  oxygen tanks at the house but has not used them in 2 years per his sister. He also injured his RIGHT knee a few weeks ago and had been getting around with a crutch. Sister reported he had been a little more confused on 8/19.  At admit to ICU, pt  had worsening acute on chronic hypoxic/hypercapnic respiratory failure s/t suspected COPD exacerbation in the setting of HFpEF and possible pulmonary hypertension requiring emergent intubation and mechanical ventilatory support.  Pt extubated on 03/11/2022; placed on BiPAP then now on [place] O2 support.   CXR: Cardiomegaly with unchanged diffuse bilateral interstitial  pulmonary opacity and small layering pleural effusions, findings  likely edema.  MRI: No acute intracranial abnormality.  2. Chronic microvascular ischemic disease for age.     SLP Plan  Continue with current plan of care (diet check)      Recommendations for follow up therapy are one component of a multi-disciplinary discharge planning process, led by the attending physician.  Recommendations may be updated based on patient status, additional functional criteria and insurance authorization.    Recommendations  Diet recommendations: Regular;Thin liquid Liquids provided via: Cup;Straw Medication Administration: Whole meds with puree (vs need to crush) Supervision: Patient able to self feed;Staff to assist with self feeding;Intermittent supervision to cue for compensatory strategies Compensations: Minimize environmental distractions;Slow rate;Small sips/bites;Lingual sweep for clearance of pocketing;Follow solids with liquid Postural Changes and/or Swallow Maneuvers: Out of bed for meals;Seated upright 90 degrees;Upright 30-60 min after meal                General recommendations:  (Dietician f/u) Oral Care Recommendations: Oral care BID;Oral care before and after PO;Staff/trained caregiver to provide oral care (support pt w/ oral care) Follow Up Recommendations: Follow physician's recommendations for discharge plan and follow up therapies Assistance recommended at discharge: Intermittent Supervision/Assistance SLP Visit Diagnosis: Dysphagia, unspecified (R13.10) (impact from Cognitive decline) Plan: Continue with current plan of  care (diet check)              Jerilynn Som, MS, CCC-SLP Speech Language Pathologist Rehab Services; [facility] [phone] (ascom) Youcef Klas  03/19/2022, 4:18 PM

## 2022-03-19 NOTE — Progress Notes (Signed)
   03/19/22 0244  Unmeasured Output  Urine Occurrence 1  Stool Occurrence 1  Urine Characteristics  Bladder Scan Volume (mL) 268 mL  Hygiene Bath;Peri care     Bladder scan performed after a urine occurrence. BS volume was 268, will continue to monitor for spontaneous void and/or the need for an I/O cath per protocol.

## 2022-03-20 LAB — GLUCOSE, CAPILLARY
Glucose-Capillary: 135 mg/dL — ABNORMAL HIGH (ref 70–99)
Glucose-Capillary: 142 mg/dL — ABNORMAL HIGH (ref 70–99)
Glucose-Capillary: 174 mg/dL — ABNORMAL HIGH (ref 70–99)
Glucose-Capillary: 195 mg/dL — ABNORMAL HIGH (ref 70–99)
Glucose-Capillary: 191 mg/dL — ABNORMAL HIGH (ref 70–99)

## 2022-03-20 NOTE — TOC Progression Note (Signed)
Transition of Care [facility]) - Progression Note    Patient Details  Name: Caulder Wehner MRN: [id number] Date of Birth: October 07, 1960  [facility]) CM/SW Contact  Gildardo Griffes, [place] Phone Number: 03/20/2022, 4:05 PM  Clinical Narrative:     CSW spoke with patient's sister Lupita Leash who was updated by this CSW that patient has no bed offers, no insurance, and no facilities accepting LOG at this time.   Patient's sister Lupita Leash requested an email of beneficial PT and OT exercises for her to do with the patient because she's not satisfied with the work that is being done here. CSW has relayed this information to PT and OT, reports She can be emailed at [email or web address]  [employer] also requests a call from MD, CSW has updated MD.   Lupita Leash reports she was able to re submit for patient's Medicaid application to be re activated, and she is working on disability.   Expected Discharge Plan: Skilled Nursing Facility Barriers to Discharge: Continued Medical Work up  Expected Discharge Plan and Services Expected Discharge Plan: Skilled Nursing Facility   Discharge Planning Services: CM Consult Post Acute Care Choice: Skilled Nursing Facility Living arrangements for the past 2 months: Single Family Home                                       Social Determinants of Health (SDOH) Interventions    Readmission Risk Interventions     No data to display

## 2022-03-20 NOTE — Progress Notes (Signed)
At bedside for PIV placement Pt refusing IV start. Primary RN at bedside. Reported to RN to place another consult if the patient becomes agreeable to PIV placement.

## 2022-03-20 NOTE — TOC Progression Note (Addendum)
Transition of Care [facility]) - Progression Note    Patient Details  Name: Bryan Mitchell MRN: [id number] Date of Birth: 09/22/1960  Transition of Care [facility]) CM/SW Contact  Gildardo Griffes, [place] Phone Number: 03/20/2022, 9:35 AM  Clinical Narrative:     CSW has re sent referrals and reached out to multiple SNFs to identify if they are able to accept patient with 30 day LOG, pending responses at this time. Patient continues to have no SNF bed offers, although working with PT/OT he has no bed offers and continues to be maximum 2 person assist. Will recommend he continue to work with PT/OT hoepful for ambulatory improvements if bed offers are unavailable to take LOG, as patient has no payor source. Per notes his mediciad was deactivated and his sister is working on Haematologist.    Expected Discharge Plan: Skilled Nursing Facility Barriers to Discharge: Continued Medical Work up  Expected Discharge Plan and Services Expected Discharge Plan: Skilled Nursing Facility   Discharge Planning Services: CM Consult Post Acute Care Choice: Skilled Nursing Facility Living arrangements for the past 2 months: Single Family Home                                       Social Determinants of Health (SDOH) Interventions    Readmission Risk Interventions     No data to display

## 2022-03-20 NOTE — Progress Notes (Signed)
Occupational Therapy Treatment Patient Details Name: Bryan Mitchell MRN: [id number] DOB: 1960/10/24 Today's Date: 03/20/2022   History of present illness Patient is a 61 year old male with worsening acute on chronic hypoxic/hypercapnic respiratory failure requiring emergent intubation and mechanical ventilatory support. Suspected COPD exacerbation in the setting of HFpEF and possible pulmonary hypertension. Required intuatbion but is now extubated.   OT comments  Bryan Mitchell was seen for OT/PT co- treatment on this date. Upon arrival to room pt reclined in bed, agreeable to tx. Pt requires MOD A x2 exit bed. Fair sitting balance with BUE support, tolerates ~15 min static and dynamic sitting balance. Tolerates x2 standing trials from elevated bed with MOD A x2, pt quickly returns to sitting citing pain in B feet. Pt agreeable to transfer to chair however limited by difficulty initiating and sequencing lateral scoot t/f, ultimately requires MAX A x2. RN notified pt in chair, all needs in reach. Pt making progress toward goals, will continue to follow POC. Discharge recommendation remains appropriate.     Recommendations for follow up therapy are one component of a multi-disciplinary discharge planning process, led by the attending physician.  Recommendations may be updated based on patient status, additional functional criteria and insurance authorization.    Follow Up Recommendations  Skilled nursing-short term rehab (<3 hours/day)    Assistance Recommended at Discharge Frequent or constant Supervision/Assistance  Patient can return home with the following  Two people to help with walking and/or transfers;Two people to help with bathing/dressing/bathroom   Equipment Recommendations  Hospital bed    Recommendations for Other Services      Precautions / Restrictions Precautions Precautions: Fall Restrictions Weight Bearing Restrictions: No       Mobility Bed Mobility Overal bed  mobility: Needs Assistance Bed Mobility: Supine to Sit     Supine to sit: Mod assist, +2 for physical assistance, HOB elevated          Transfers Overall transfer level: Needs assistance Equipment used: 2 person hand held assist Transfers: Sit to/from Stand, Bed to chair/wheelchair/BSC Sit to Stand: Mod assist, +2 physical assistance, From elevated surface          Lateral/Scoot Transfers: Max assist, +2 physical assistance General transfer comment: does not achieve full upright citing B foot pain, difficulty initiating scoot     Balance Overall balance assessment: Needs assistance Sitting-balance support: Feet supported, Bilateral upper extremity supported Sitting balance-Leahy Scale: Fair       Standing balance-Leahy Scale: Zero                             ADL either performed or assessed with clinical judgement   ADL Overall ADL's : Needs assistance/impaired                                       General ADL Comments: MAX A don B socks at bed level. MAX A x2 simulated drop arm BSC t/f    Extremity/Trunk Assessment               Cognition Arousal/Alertness: Awake/alert Behavior During Therapy: Impulsive Overall Cognitive Status: No family/caregiver present to determine baseline cognitive functioning Area of Impairment: Following commands, Safety/judgement, Problem solving                       Following Commands: Follows one step commands  inconsistently, Follows one step commands with increased time Safety/Judgement: Decreased awareness of safety, Decreased awareness of deficits   Problem Solving: Decreased initiation, Difficulty sequencing, Requires verbal cues, Requires tactile cues, Slow processing General Comments: requires constant redirection to task. Pt is perseverative on B foot pain, agrees to tranfser then unable to follow cues for safety / hand placement              General Comments SpO2 87% on RA, 91%  on 4L Bryan Mitchell    Pertinent Vitals/ Pain       Pain Assessment Pain Assessment: Faces Faces Pain Scale: Hurts whole lot Pain Location: bilateral feet Pain Descriptors / Indicators: Sore Pain Intervention(s): Limited activity within patient's tolerance, Repositioned   Frequency  Min 2X/week        Progress Toward Goals  OT Goals(current goals can now be found in the care plan section)  Progress towards OT goals: Progressing toward goals  Acute Rehab OT Goals Patient Stated Goal: to pee OT Goal Formulation: With patient Time For Goal Achievement: 03/26/22 Potential to Achieve Goals: Fair ADL Goals Pt Will Perform Grooming: with set-up;with supervision;bed level Pt Will Perform Upper Body Dressing: with mod assist Pt Will Transfer to Toilet: with min assist;with +2 assist Pt/caregiver will Perform Home Exercise Program: Increased ROM;Increased strength;Both right and left upper extremity  Plan Discharge plan remains appropriate;Frequency remains appropriate    Co-evaluation    PT/OT/SLP Co-Evaluation/Treatment: Yes Reason for Co-Treatment: Necessary to address cognition/behavior during functional activity;To address functional/ADL transfers;For patient/therapist safety PT goals addressed during session: Mobility/safety with mobility OT goals addressed during session: ADL's and self-care      AM-PAC OT "6 Clicks" Daily Activity     Outcome Measure   Help from another person eating meals?: A Little Help from another person taking care of personal grooming?: A Little Help from another person toileting, which includes using toliet, bedpan, or urinal?: A Lot Help from another person bathing (including washing, rinsing, drying)?: A Lot Help from another person to put on and taking off regular upper body clothing?: A Lot Help from another person to put on and taking off regular lower body clothing?: A Lot 6 Click Score: 14    End of Session    OT Visit Diagnosis: Other  abnormalities of gait and mobility (R26.89);Muscle weakness (generalized) (M62.81)   Activity Tolerance Patient tolerated treatment well;Patient limited by fatigue   Patient Left in chair;with call bell/phone within reach;with chair alarm set   Nurse Communication Mobility status;Need for lift equipment        Time: 1413-1441 OT Time Calculation (min): 28 min  Charges: OT General Charges $OT Visit: 1 Visit OT Treatments $Self Care/Home Management : 8-22 mins  Kathie Dike, M.S. OTR/L  03/20/22, 4:29 PM  ascom [phone]

## 2022-03-20 NOTE — Progress Notes (Signed)
16 fr foley inserted for urinary retention, 600 cc urine output,  pt pulled out his IV and refused to have another inserted his this time

## 2022-03-20 NOTE — Progress Notes (Addendum)
Nutrition Follow-up  DOCUMENTATION CODES:   Morbid obesity  INTERVENTION:   -Continue Ensure Enlive po TID, each supplement provides 350 kcal and 20 grams of protein -D/c Vital Cuisine -Continue MVI daily  NUTRITION DIAGNOSIS:   Inadequate oral intake related to inability to eat (pt sedated and ventilated) as evidenced by NPO status.  Progressing; advanced to PO diet on 03/14/22  GOAL:   Patient will meet greater than or equal to 90% of their needs  Progressing   MONITOR:   PO intake, Supplement acceptance, Labs, Weight trends, Skin, I & O's  REASON FOR ASSESSMENT:   Ventilator    ASSESSMENT:   61 y/o male with h/o COPD, DM, HTN, HLD, CHF, polycythemia and alpha gal who is admitted with rhinovirus, PNA and AMS.  9/12- s/p BSE- liberalized diet to regular  Reviewed I/O's: -960 ml x 24 hours and -13.4 L since 03/06/22  UOP: 1.8 L x 24 hours   Pt unavailable at time of visit. Attempted to speak with pt via call to hospital room phone, however, unable to reach.   Pt with variable oral intake. Per SLP notes, pt refused yesterday's meal tray due to desire to lose weight. Noted meal completions 25-100%. Pt is drinking Ensure supplements.   Per TOC notes, awaiting SNF placement with LOG. Hopeful for continued improvement with participation in therapy.   Medications reviewed and include miralax.   Labs reviewed: CBGS: 113-209 (inpatient orders for glycemic control are 0-20 units insulin aspart TID before meals and at bedtime and 20 units insulin glargine-yfgn daily at bedtime).    Diet Order:   Diet Order             Diet regular Room service appropriate? Yes with Assist; Fluid consistency: Thin  Diet effective now                   EDUCATION NEEDS:   No education needs have been identified at this time  Skin:  Skin Assessment: Skin Integrity Issues: Skin Integrity Issues:: Other (Comment), Stage II, DTI DTI: lt lateral hand Stage II: lt hand Other: open  wound lt thigh  Last BM:  03/19/22 (type 7)  Height:   Ht Readings from Last 1 Encounters:  02/23/22 5\' 8"  (1.727 m)    Weight:   Wt Readings from Last 1 Encounters:  03/20/22 130.8 kg    Ideal Body Weight:  70 kg  BMI:  Body mass index is 43.85 kg/m.  Estimated Nutritional Needs:   Kcal:  03/22/22  Protein:  125-140 grams  Fluid:  > 2 L    [id number], RD, LDN, CDCES Registered Dietitian II Certified Diabetes Care and Education Specialist Please refer to [facility] for RD and/or RD on-call/weekend/after hours pager

## 2022-03-20 NOTE — Progress Notes (Signed)
  PROGRESS NOTE    Bryan Mitchell  JOA:[id number] DOB: [birth year] DOA: 02/23/2022 PCP: Pcp, No  237A/237A-AA  LOS: 25 days   Brief hospital course: No notes on file  Assessment & Plan: Bryan Mitchell is a 61 y.o. M presenting to [facility] on 02/23/22 from home in a wheelchair with his sister for evaluation of worsening fatigue, poor appetite and SOB over the past 2 weeks. Per ED documentation, patient also complained of worsening bilateral leg swelling and pain.      CT angio negative for pulmonary edema/effusions, negative for PE.  Kindly review events from 19 August to set 7th in progress notes   Acute on chronic hypoxic and hypercapnia respiratory failure COPD exacerbation congestive heart failure diastolic rhinovirus infection-- resolved tobacco abuse -- patient now extubated on high flow nasal cannula oxygen, currently on 5L -- completed IV steroids, IV antibiotic -- diuresis as BP and renal function permits   Acute on chronic diastolic heart failure pulmonary hypertension -- Echo shows EF 60 to 65%, grade 2 diastolic dysfunction, pulmonary hypertension -- Lasix held due to hypernatremia   Polycythemia secondary to chronic hypoxia --stable   Type II diabetes with morbid obesity -- hemoglobin A1c 6.5 --cont glargine 20u nightly --SSI   Generalized weakness, morbid obesity, wheelchair-bound -- PT/OT   Urinary retention with extended period of foley use given acuity of illness --foley removed 03/18/22, failed voiding trial needing I/O cath --cont flomax --re-insert Foley today   Intermittent confusion with agitation/?underlying cognitive decline vs hospital delirium (No mention of such in PMH) --cont seroquel nightly (new)  morbid obesity, BMI 48 on presentation  suspected sleep apnea --BiPAP nightly   DVT prophylaxis: [id number] Code Status: Full code  Family Communication: updated sister Lupita Leash on the phone today Level of care: Progressive Dispo:   The  patient is from: home Anticipated d/c is to: undetermined Anticipated d/c date is: undetermined   Subjective and Interval History:  Pt complained of pain in the bottom of both his feet.  No dyspnea.  Eating well, too much he said.   Objective: Vitals:   03/20/22 1153 03/20/22 1200 03/20/22 1545 03/20/22 1605  BP: 104/61   120/74  Pulse: (!) 103   98  Resp: 18   20  Temp: 98.2 F (36.8 C)   98.3 F (36.8 C)  TempSrc:      SpO2: 95% 93% 94% 96%  Weight:      Height:        Intake/Output Summary (Last 24 hours) at 03/20/2022 1810 Last data filed at 03/20/2022 0800 Gross per 24 hour  Intake 720 ml  Output 600 ml  Net 120 ml   Filed Weights   03/18/22 0500 03/19/22 0245 03/20/22 0419  Weight: 131.1 kg 130.5 kg 130.8 kg    Examination:   Constitutional: NAD, AAOx3 HEENT: conjunctivae and lids normal, EOMI CV: No cyanosis.   RESP: normal respiratory effort Extremities: mild edema in BLE.  Pain with dorsiflex of both feet SKIN: warm, dry Neuro: II - XII grossly intact.   Psych: Normal mood and affect.     Data Reviewed: I have personally reviewed labs and imaging studies  Time spent: 35 minutes   Darlin Priestly, MD [facility] If 7PM-7AM, please contact night-coverage 03/20/2022, 6:10 PM

## 2022-03-20 NOTE — Progress Notes (Signed)
Physical Therapy Treatment Patient Details Name: Bryan Mitchell MRN: [id number] DOB: July 08, 1961 Today's Date: 03/20/2022   History of Present Illness Patient is a 61 year old male with worsening acute on chronic hypoxic/hypercapnic respiratory failure requiring emergent intubation and mechanical ventilatory support. Suspected COPD exacerbation in the setting of HFpEF and possible pulmonary hypertension. Required intuatbion but is now extubated.    PT Comments    Session completed with OT co-treat. Units were split accordingly. The pt presents with cognitive deficits being his greatest limiting factor. He requires multiple tactile and verbal cues for all mobility. He also reports bilateral plantar foot pain during WB attempts. The pt was able to participate in standing attempts this session, however pain was limiting. PT continues to recommend SNF and will continue to follow.    Recommendations for follow up therapy are one component of a multi-disciplinary discharge planning process, led by the attending physician.  Recommendations may be updated based on patient status, additional functional criteria and insurance authorization.  Follow Up Recommendations  Skilled nursing-short term rehab (<3 hours/day) Can patient physically be transported by private vehicle: No   Assistance Recommended at Discharge Frequent or constant Supervision/Assistance  Patient can return home with the following Two people to help with walking and/or transfers;Two people to help with bathing/dressing/bathroom;Help with stairs or ramp for entrance;Assist for transportation;Assistance with feeding;Direct supervision/assist for medications management;Direct supervision/assist for financial management;Assistance with cooking/housework   Equipment Recommendations       Recommendations for Other Services       Precautions / Restrictions Precautions Precautions: Fall Restrictions Weight Bearing Restrictions: No      Mobility  Bed Mobility Overal bed mobility: Needs Assistance Bed Mobility: Supine to Sit     Supine to sit: Mod assist, +2 for physical assistance, HOB elevated          Transfers Overall transfer level: Needs assistance Equipment used:  (Chuck pads for transfer.) Transfers: Sit to/from Stand, Bed to chair/wheelchair/BSC Sit to Stand: Mod assist, +2 physical assistance, From elevated surface, +2 safety/equipment (x2 attempts)          Lateral/Scoot Transfers: +2 physical assistance, Max assist General transfer comment: Pt requiring several verbal and tactile cues for hand placement. He demonstrated inability to initiate transfer, but with an initiation tactile cue would assist with the completion of the transfer.    Ambulation/Gait                   Stairs             Wheelchair Mobility    Modified Rankin (Stroke Patients Only)       Balance Overall balance assessment: Needs assistance           Standing balance-Leahy Scale: Zero                              Cognition Arousal/Alertness: Awake/alert Behavior During Therapy: Impulsive Overall Cognitive Status: No family/caregiver present to determine baseline cognitive functioning Area of Impairment: Following commands, Safety/judgement, Problem solving                         Safety/Judgement: Decreased awareness of safety, Decreased awareness of deficits   Problem Solving: Decreased initiation, Difficulty sequencing, Requires verbal cues, Requires tactile cues, Slow processing          Exercises      General Comments        Pertinent  Vitals/Pain Pain Assessment Pain Assessment: Faces Faces Pain Scale: Hurts whole lot Pain Location: bilateral feet Pain Descriptors / Indicators: Sore Pain Intervention(s): Repositioned (feet elevated.)    Home Living                          Prior Function            PT Goals (current goals can now be  found in the care plan section) Acute Rehab PT Goals Patient Stated Goal: to get up PT Goal Formulation: With patient Time For Goal Achievement: 03/28/22 Potential to Achieve Goals: Fair Progress towards PT goals: Progressing toward goals    Frequency    Min 2X/week      PT Plan Current plan remains appropriate    Co-evaluation PT/OT/SLP Co-Evaluation/Treatment: Yes Reason for Co-Treatment: Necessary to address cognition/behavior during functional activity;For patient/therapist safety;To address functional/ADL transfers PT goals addressed during session: Mobility/safety with mobility        AM-PAC PT "6 Clicks" Mobility   Outcome Measure  Help needed turning from your back to your side while in a flat bed without using bedrails?: A Lot Help needed moving from lying on your back to sitting on the side of a flat bed without using bedrails?: A Lot Help needed moving to and from a bed to a chair (including a wheelchair)?: Total Help needed standing up from a chair using your arms (e.g., wheelchair or bedside chair)?: Total Help needed to walk in hospital room?: Total Help needed climbing 3-5 steps with a railing? : Total 6 Click Score: 8    End of Session Equipment Utilized During Treatment: Oxygen Activity Tolerance: Patient limited by pain Patient left: in chair;with call bell/phone within reach;with chair alarm set Nurse Communication: Mobility status;Need for lift equipment PT Visit Diagnosis: Unsteadiness on feet (R26.81);Muscle weakness (generalized) (M62.81)     Time: 6767-2094 PT Time Calculation (min) (ACUTE ONLY): 29 min  Charges:  $Therapeutic Activity: 8-22 mins                     4:05 PM, 03/20/22 Rasha Ibe A. Mordecai Maes PT, DPT Physical Therapist - [facility]    Audrey Eller A Ilana Prezioso 03/20/2022, 3:59 PM

## 2022-03-21 LAB — BASIC METABOLIC PANEL
Anion gap: 10 (ref 5–15)
BUN: 20 mg/dL (ref 6–20)
CO2: 28 mmol/L (ref 22–32)
Calcium: 9.6 mg/dL (ref 8.9–10.3)
Chloride: 101 mmol/L (ref 98–111)
Creatinine, Ser: 0.59 mg/dL — ABNORMAL LOW (ref 0.61–1.24)
GFR, Estimated: >60 mL/min (ref >60)
Glucose, Bld: 168 mg/dL — ABNORMAL HIGH (ref 70–99)
Potassium: 3.8 mmol/L (ref 3.5–5.1)
Sodium: 139 mmol/L (ref 135–145)

## 2022-03-21 LAB — CBC
HCT: 55.1 % — ABNORMAL HIGH (ref 39.0–52.0)
Hemoglobin: 16.8 g/dL (ref 13.0–17.0)
MCH: 27.8 pg (ref 26.0–34.0)
MCHC: 30.5 g/dL (ref 30.0–36.0)
MCV: 91.2 fL (ref 80.0–100.0)
Platelets: 294 10*3/uL (ref 150–400)
RBC: 6.04 MIL/uL — ABNORMAL HIGH (ref 4.22–5.81)
RDW: 15.9 % — ABNORMAL HIGH (ref 11.5–15.5)
WBC: 6.6 10*3/uL (ref 4.0–10.5)
nRBC: 0 % (ref 0.0–0.2)

## 2022-03-21 LAB — GLUCOSE, CAPILLARY
Glucose-Capillary: 119 mg/dL — ABNORMAL HIGH (ref 70–99)
Glucose-Capillary: 128 mg/dL — ABNORMAL HIGH (ref 70–99)
Glucose-Capillary: 150 mg/dL — ABNORMAL HIGH (ref 70–99)
Glucose-Capillary: 175 mg/dL — ABNORMAL HIGH (ref 70–99)
Glucose-Capillary: 180 mg/dL — ABNORMAL HIGH (ref 70–99)

## 2022-03-21 LAB — MAGNESIUM: Magnesium: 1.9 mg/dL (ref 1.7–2.4)

## 2022-03-21 NOTE — Progress Notes (Signed)
  PROGRESS NOTE    Bryan Mitchell  VZC:[id number] DOB: August 30, 1960 DOA: 02/23/2022 PCP: Pcp, No  107A/107A-AA  LOS: 26 days   Brief hospital course: No notes on file  Assessment & Plan: Bryan Mitchell is a 61 y.o. M presenting to [facility] ED on 02/23/22 from home in a wheelchair with his sister for evaluation of worsening fatigue, poor appetite and SOB over the past 2 weeks. Per ED documentation, patient also complained of worsening bilateral leg swelling and pain.      CT angio negative for pulmonary edema/effusions, negative for PE.  Kindly review events from 19 August to set 7th in progress notes   Acute on chronic hypoxic and hypercapnia respiratory failure COPD exacerbation congestive heart failure diastolic rhinovirus infection-- resolved tobacco abuse -- patient now extubated on high flow nasal cannula oxygen, currently on 5L -- completed IV steroids, IV antibiotic -- diuresis as BP and renal function permits   Acute on chronic diastolic heart failure pulmonary hypertension -- Echo shows EF 60 to 65%, grade 2 diastolic dysfunction, pulmonary hypertension -- Lasix held due to hypernatremia   Polycythemia secondary to chronic hypoxia --stable   Type II diabetes with morbid obesity -- hemoglobin A1c 6.5 --cont glargine 20u nightly --SSI   Generalized weakness, morbid obesity, wheelchair-bound -- PT/OT   Urinary retention with extended period of foley use given acuity of illness --foley removed 03/18/22, failed voiding trial needing I/O cath --re-inserted Foley on 9/13 --cont flomax   Intermittent confusion with agitation/?underlying cognitive decline vs hospital delirium (No mention of such in PMH) --cont seroquel nightly (new)  morbid obesity, BMI 48 on presentation  suspected sleep apnea --BiPAP nightly   DVT prophylaxis: [id number]:INOMVEH Code Status: Full code  Family Communication:  Level of care: Med-Surg Dispo:   The patient is from: home Anticipated d/c is  to: undetermined Anticipated d/c date is: undetermined   Subjective and Interval History:  Pt complained of back pain from a knot, and still have pain when palpating the bottom of his feet.  Worked with PT today.   Objective: Vitals:   03/21/22 1140 03/21/22 1559 03/21/22 2000 03/21/22 2002  BP: 105/66  115/64   Pulse: 99  (!) 102   Resp: 18  18   Temp: 98.2 F (36.8 C)  98.1 F (36.7 C)   TempSrc: Oral     SpO2: 94% 94% 90% 95%  Weight:      Height:        Intake/Output Summary (Last 24 hours) at 03/21/2022 2008 Last data filed at 03/21/2022 1141 Gross per 24 hour  Intake 2160 ml  Output 1200 ml  Net 960 ml   Filed Weights   03/19/22 0245 03/20/22 0419 03/21/22 0431  Weight: 130.5 kg 130.8 kg 127.7 kg    Examination:   Constitutional: NAD, AAOx3 HEENT: conjunctivae and lids normal, EOMI CV: No cyanosis.   RESP: normal respiratory effort, on 4L Extremities: tenderness to palpation over bottom of feet SKIN: warm, dry Neuro: II - XII grossly intact.   Psych: Normal mood and affect.     Data Reviewed: I have personally reviewed labs and imaging studies  Time spent: 25 minutes   Darlin Priestly, MD [facility] If 7PM-7AM, please contact night-coverage 03/21/2022, 8:08 PM

## 2022-03-21 NOTE — Progress Notes (Signed)
Physical Therapy Treatment Patient Details Name: Bryan Mitchell MRN: [id number] DOB: Dec 13, 1960 Today's Date: 03/21/2022   History of Present Illness Patient is a 61 year old male with worsening acute on chronic hypoxic/hypercapnic respiratory failure requiring emergent intubation and mechanical ventilatory support. Suspected COPD exacerbation in the setting of HFpEF and possible pulmonary hypertension. Required intuatbion but is now extubated.    PT Comments    The pt presents this session with improved mentation from previous session. He is highly motivated to work with PT. He continues to c/o bilateral plantar surface foot pain, but reports that this is lessened with wearing of bilateral shoes. The pt is able to tolerate standing x3 attempts with longest stand being 12 sec. He also demonstrates improved balance with use of bariatric RW. PT will continue to follow.    Recommendations for follow up therapy are one component of a multi-disciplinary discharge planning process, led by the attending physician.  Recommendations may be updated based on patient status, additional functional criteria and insurance authorization.  Follow Up Recommendations  Skilled nursing-short term rehab (<3 hours/day) Can patient physically be transported by private vehicle: No   Assistance Recommended at Discharge Frequent or constant Supervision/Assistance  Patient can return home with the following Two people to help with walking and/or transfers;Two people to help with bathing/dressing/bathroom;Help with stairs or ramp for entrance;Assist for transportation;Assistance with feeding;Direct supervision/assist for medications management;Direct supervision/assist for financial management;Assistance with cooking/housework   Equipment Recommendations       Recommendations for Other Services       Precautions / Restrictions Precautions Precautions: Fall Restrictions Weight Bearing Restrictions: No      Mobility  Bed Mobility Overal bed mobility: Needs Assistance Bed Mobility: Supine to Sit, Sit to Supine     Supine to sit: Mod assist, +2 for physical assistance, HOB elevated Sit to supine: Total assist, +2 for physical assistance        Transfers Overall transfer level: Needs assistance Equipment used: Rolling walker (2 wheels) (Bariatric RW) Transfers: Sit to/from Stand Sit to Stand: Mod assist, +2 safety/equipment, From elevated surface                Ambulation/Gait               General Gait Details: Attempted standing marching but unable to complete safely.   Stairs             Wheelchair Mobility    Modified Rankin (Stroke Patients Only)       Balance           Standing balance support: Reliant on assistive device for balance Standing balance-Leahy Scale: Poor                              Cognition Arousal/Alertness: Awake/alert Behavior During Therapy: WFL for tasks assessed/performed Overall Cognitive Status: Within Functional Limits for tasks assessed                                          Exercises      General Comments        Pertinent Vitals/Pain Pain Assessment Pain Assessment: 0-10 Pain Score: 2  Pain Location: bilateral feet Pain Descriptors / Indicators: Sore Pain Intervention(s): Other (comment) (Placed shoes on for therapy with pt reporting decreased pain.)    Home Living  Prior Function            PT Goals (current goals can now be found in the care plan section) Acute Rehab PT Goals Patient Stated Goal: to get up PT Goal Formulation: With patient Time For Goal Achievement: 03/28/22 Potential to Achieve Goals: Fair Progress towards PT goals: Progressing toward goals    Frequency    Min 2X/week      PT Plan Current plan remains appropriate    Co-evaluation              AM-PAC PT "6 Clicks" Mobility   Outcome  Measure  Help needed turning from your back to your side while in a flat bed without using bedrails?: A Lot Help needed moving from lying on your back to sitting on the side of a flat bed without using bedrails?: A Lot Help needed moving to and from a bed to a chair (including a wheelchair)?: Total Help needed standing up from a chair using your arms (e.g., wheelchair or bedside chair)?: A Lot Help needed to walk in hospital room?: Total Help needed climbing 3-5 steps with a railing? : Total 6 Click Score: 9    End of Session Equipment Utilized During Treatment: Oxygen;Gait belt Activity Tolerance: Patient tolerated treatment well Patient left: in bed;with bed alarm set Nurse Communication: Mobility status PT Visit Diagnosis: Unsteadiness on feet (R26.81);Muscle weakness (generalized) (M62.81)     Time: 0623-7628 PT Time Calculation (min) (ACUTE ONLY): 23 min  Charges:  $Therapeutic Activity: 23-37 mins                     4:06 PM, 03/21/22 Kamryn Gauthier A. Mordecai Maes PT, DPT Physical Therapist - [facility]    Ariana Cavenaugh A Chana Lindstrom 03/21/2022, 4:03 PM

## 2022-03-21 NOTE — Progress Notes (Signed)
Pt refused to wear BIPAP, RN Maralyn Sago would call if pt changed his mind.

## 2022-03-22 LAB — BASIC METABOLIC PANEL
Anion gap: 9 (ref 5–15)
BUN: 16 mg/dL (ref 6–20)
CO2: 29 mmol/L (ref 22–32)
Calcium: 9.3 mg/dL (ref 8.9–10.3)
Chloride: 100 mmol/L (ref 98–111)
Creatinine, Ser: 0.65 mg/dL (ref 0.61–1.24)
GFR, Estimated: >60 mL/min (ref >60)
Glucose, Bld: 127 mg/dL — ABNORMAL HIGH (ref 70–99)
Potassium: 4 mmol/L (ref 3.5–5.1)
Sodium: 138 mmol/L (ref 135–145)

## 2022-03-22 LAB — CBC
HCT: 54.6 % — ABNORMAL HIGH (ref 39.0–52.0)
Hemoglobin: 16.5 g/dL (ref 13.0–17.0)
MCH: 27.7 pg (ref 26.0–34.0)
MCHC: 30.2 g/dL (ref 30.0–36.0)
MCV: 91.8 fL (ref 80.0–100.0)
Platelets: 260 10*3/uL (ref 150–400)
RBC: 5.95 MIL/uL — ABNORMAL HIGH (ref 4.22–5.81)
RDW: 15.9 % — ABNORMAL HIGH (ref 11.5–15.5)
WBC: 6 10*3/uL (ref 4.0–10.5)
nRBC: 0 % (ref 0.0–0.2)

## 2022-03-22 LAB — GLUCOSE, CAPILLARY
Glucose-Capillary: 151 mg/dL — ABNORMAL HIGH (ref 70–99)
Glucose-Capillary: 192 mg/dL — ABNORMAL HIGH (ref 70–99)
Glucose-Capillary: 113 mg/dL — ABNORMAL HIGH (ref 70–99)
Glucose-Capillary: 174 mg/dL — ABNORMAL HIGH (ref 70–99)

## 2022-03-22 LAB — MAGNESIUM: Magnesium: 1.8 mg/dL (ref 1.7–2.4)

## 2022-03-22 NOTE — Evaluation (Signed)
Speech Language Pathology Evaluation Patient Details Name: Bryan Mitchell MRN: [id number] DOB: June 30, 1961 Today's Date: 03/22/2022 Time: 6301-6010 SLP Time Calculation (min) (ACUTE ONLY): 60 min  Problem List:  Patient Active Problem List   Diagnosis Date Noted   Toxic metabolic encephalopathy    Anisocoria    Pressure injury of skin 03/06/2022   Acute hypoxemic respiratory failure (HCC) 02/23/2022   Acute respiratory failure (HCC) 02/23/2022   COPD exacerbation (HCC)    Type 2 diabetes mellitus with complication, without long-term current use of insulin (HCC)    Edema of lower extremity    Acute respiratory failure with hypoxia (HCC) 01/13/2018   Morbid obesity (HCC) 01/13/2018   Hypoxia 12/16/2016   Cellulitis and abscess of trunk 12/16/2016   Tobacco abuse 12/16/2016   Allergic reaction 12/16/2016   Polycythemia 12/16/2016   Past Medical History:  Past Medical History:  Diagnosis Date   (HFpEF) heart failure with preserved ejection fraction (HCC)    Allergic reaction 12/15/2016   COPD (chronic obstructive pulmonary disease) (HCC)    Hyperglycemia 12/2016   Hypertension    Polycythemia    Past Surgical History:  Past Surgical History:  Procedure Laterality Date   KNEE SURGERY Right    HPI:  Pt is a 61 yo M presenting to [facility] ED on 02/23/22 from home for evaluation of worsening fatigue, poor appetite and SOB over the past 2 weeks. Per ED documentation, patient also complained of worsening bilateral leg swelling and pain. He started having increased congestion and poor PO intake also. On the night of 8/18 the sister spoke with the patient on the phone. Pt  told her, "I feel like I'm dying". On 8/19, she convinced the patient to go to the hospital to be evaluated. 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 He was prescribed home O2, and has oxygen tanks at the house but has not used them in 2 years per his Sister. He  also injured his RIGHT knee a few weeks ago and had been getting around with a crutch. Sister reported he had been a little more confused on 8/19.  At admit to ICU, pt had worsening acute on chronic hypoxic/hypercapnic respiratory failure s/t suspected COPD exacerbation in the setting of HFpEF and possible pulmonary hypertension requiring emergent intubation and mechanical ventilatory support.  Pt extubated on 03/11/2022; placed on BiPAP then now on [place] O2 support.   CXR: Cardiomegaly with unchanged diffuse bilateral interstitial  pulmonary opacity and small layering pleural effusions, findings  likely edema.  MRI: No acute intracranial abnormality.  2. Chronic microvascular ischemic disease for age.   Assessment / Plan / Recommendation Clinical Impression   Pt was seen today for informal assessment of Cognitive-communication skills at bedside. The Modified Mini-Mental State Test- version A was given. Pt stated he does not write "at all", so the Writing task was not administered(this did not impact the other tasks that were given). Other informal assessments were completed including a functional Math task identifying Problem-Solving situations at home.   Upon entering room, noted pt was slouched down in bed. MOD cues and support were given to position upright in bed. Noted pt was quite pleasant and talked frequently; Tangential speech noted throughout session. Pt demonstrates Pragmatic deficits as well as deficits of Turn-taking in conversation.   A score of 69 out of 100(95 - d/t writing task not given) was obtained indicating Cognitive Impairment. W/ performance on this informal assessment as well as Cognitive  impairments including decreased Safety Awareness (also awareness of environment, ADLs), it is recommended that pt Discharge to a next venue of care w/ 24/7 Supervision.  Tasks on the Mini-Mental Test revealed strengths in Naming, Repetition, Copying figures, and Temporal Orientation. Areas most  identified as Cognitive challenges for pt were Immediate/Delayed Recall of Memory which then impacted other tasks such as following Commands, Decreased Focused Attention, and Reduced accuracy w/ Executive Function tasks(sequencing, abstract, similarities/differences). Also noted were Decreased insight for Reasoning tasks and working through Problem-solving situations during ADLs: he could identify that the milk could spoil if it were in a warm refrigerator but did not describe reasons for the problem(warm frig) to occur nor how to resolve the problem and what he would do in the meantime. When asked about functional Math (2 ways to make $13 dollars w/ 1s, 5s, 10s), he could not do this task.  Throughout the session, pt responded to the questions but then added Tangential speech; topic conversation varied. He was easily redirectable.   Recommend f/u at next venue of care to address Cognitive-communication skills in ADLs and improve Safety awareness to reduce caregiver burden and promote increased independence w/ ADLs. Pt is recommended to have 24/7 Supervision d/t Safety awareness concerns. This was relayed to MD and SW/CM.    SLP Assessment  SLP Recommendation/Assessment: All further Speech Lanaguage Pathology needs can be addressed at the next venue of care in a more Structured setting (to address higher level ADL task and Safety awareness; attention to task and consistent follow through; improve insight into self/situation) in order to promote independence and reduce caregiver burden.  SLP Visit Diagnosis: Cognitive communication deficit (R41.841)    Recommendations for follow up therapy are one component of a multi-disciplinary discharge planning process, led by the attending physician.  Recommendations may be updated based on patient status, additional functional criteria and insurance authorization.    Follow Up Recommendations  Skilled nursing-short term rehab (<3 hours/day) (d/t need for 24/7  Supervision secondary for safety awareness concerns)    Assistance Recommended at Discharge  Intermittent Supervision/Assistance (insight, setup, safety)  Functional Status Assessment Patient has had a recent decline in their functional status and/or demonstrates limited ability to make significant improvements in function in a reasonable and predictable amount of time  Frequency and Duration  (n/a)   (n/a)      SLP Evaluation Cognition  Overall Cognitive Status: No family/caregiver present to determine baseline cognitive functioning (Unsure) Arousal/Alertness: Awake/alert Orientation Level: Oriented to person;Oriented to place;Oriented to time Year: 2023 Month: September Day of Week: Incorrect Attention: Focused;Sustained;Alternating Focused Attention: Impaired Focused Attention Impairment: Verbal complex;Functional complex Sustained Attention: Impaired Sustained Attention Impairment: Verbal complex;Functional complex Alternating Attention: Impaired Alternating Attention Impairment: Verbal complex;Functional complex Memory: Impaired Memory Impairment: Retrieval deficit;Decreased recall of new information Awareness: Impaired Awareness Impairment: Anticipatory impairment Problem Solving: Impaired Problem Solving Impairment: Verbal complex;Functional complex Executive Function: Sequencing;Decision Making Sequencing: Impaired Sequencing Impairment: Verbal complex;Functional complex Decision Making: Impaired Decision Making Impairment: Verbal complex;Functional complex Behaviors: Impulsive (Tangential speech) Safety/Judgment: Impaired Comments: stated he did not feel he would need help at home       Comprehension  Auditory Comprehension Overall Auditory Comprehension: Impaired Yes/No Questions: Impaired (more complex, paragraph) Complex Questions: 50-74% accurate Paragraph Comprehension (via yes/no questions): 51-75% accurate Commands: Impaired Multistep Basic Commands:  25-49% accurate Conversation: Simple Other Conversation Comments: suspect impact from memory/recall and Cognitive functioning Interfering Components: Attention EffectiveTechniques: Repetition Visual Recognition/Discrimination Discrimination: Not tested Reading Comprehension Reading Status: Impaired Word level:  Within functional limits Sentence Level: Within functional limits (3-word level; basic sentence) Functional Environmental (signs, name badge): Within functional limits (grossly) Interfering Components: Attention (10th grade education?)    Expression Expression Primary Mode of Expression: Verbal Verbal Expression Overall Verbal Expression: Appears within functional limits for tasks assessed (grossly) Initiation: No impairment Automatic Speech: Name;Social Response;Counting;Day of week Level of Generative/Spontaneous Verbalization: Sentence Repetition:  (x1) Naming: No impairment Pragmatics: Impairment Impairments: Topic maintenance;Turn Taking (Tangential speech) Interfering Components: Attention Non-Verbal Means of Communication: Not applicable Other Verbal Expression Comments: encouraged pt to slow down, take his time Written Expression Dominant Hand: Right Written Expression: Not tested (pt stated he "does not write")   Oral / Motor  Oral Motor/Sensory Function Overall Oral Motor/Sensory Function: Within functional limits Facial Symmetry: Within Functional Limits Lingual ROM: Within Functional Limits Lingual Symmetry: Within Functional Limits Lingual Strength: Within Functional Limits Motor Speech Overall Motor Speech: Appears within functional limits for tasks assessed (grossly - min rushed/mumbled speech) Respiration: Within functional limits Phonation: Normal Resonance: Within functional limits Articulation: Within functional limitis (when slowing down) Intelligibility: [place], [place], [place]; [place] [phone] (ascom) Editha Bridgeforth 03/22/2022, 2:15 PM

## 2022-03-22 NOTE — Progress Notes (Signed)
  PROGRESS NOTE    Bryan Mitchell  BZJ:[id number] DOB: Oct 28, 1960 DOA: 02/23/2022 PCP: Pcp, No  107A/107A-AA  LOS: 27 days   Brief hospital course: No notes on file  Assessment & Plan: Bryan Mitchell is a 61 y.o. M presenting to [facility] ED on 02/23/22 from home in a wheelchair with his sister for evaluation of worsening fatigue, poor appetite and SOB over the past 2 weeks. Per ED documentation, patient also complained of worsening bilateral leg swelling and pain.      CT angio negative for pulmonary edema/effusions, negative for PE.  Kindly review events from 19 August to set 7th in progress notes   Acute on chronic hypoxic and hypercapnia respiratory failure COPD exacerbation congestive heart failure diastolic rhinovirus infection-- resolved tobacco abuse -- patient now extubated on high flow nasal cannula oxygen, currently on 4L -- completed IV steroids, IV antibiotic -- diuresis as BP and renal function permits   Acute on chronic diastolic heart failure pulmonary hypertension -- Echo shows EF 60 to 65%, grade 2 diastolic dysfunction, pulmonary hypertension -- Lasix held due to hypernatremia   Polycythemia secondary to chronic hypoxia --stable   Type II diabetes with morbid obesity -- hemoglobin A1c 6.5 --cont glargine 20u nightly --SSI   Generalized weakness, morbid obesity, wheelchair-bound -- PT/OT   Urinary retention with extended period of foley use given acuity of illness --foley removed 03/18/22, failed voiding trial needing I/O cath --re-inserted Foley on 9/13 --cont flomax   Intermittent confusion with agitation/?underlying cognitive decline vs hospital delirium (No mention of such in PMH) --cont seroquel nightly (new)  morbid obesity, BMI 48 on presentation  suspected sleep apnea --BiPAP nightly   DVT prophylaxis: OF:BPZWCHE Code Status: Full code  Family Communication:  Level of care: Med-Surg Dispo:   The patient is from: home Anticipated d/c is  to: undetermined Anticipated d/c date is: undetermined   Subjective and Interval History:  Working better with PT today.  Still has pain in his feet.   Objective: Vitals:   03/22/22 0345 03/22/22 0424 03/22/22 0748 03/22/22 0755  BP: (!) 131/90   134/87  Pulse: 97   99  Resp: 19   16  Temp: 98.1 F (36.7 C)   98.4 F (36.9 C)  TempSrc: Oral     SpO2: 90%  95% 91%  Weight:  130.6 kg    Height:        Intake/Output Summary (Last 24 hours) at 03/22/2022 1917 Last data filed at 03/22/2022 1900 Gross per 24 hour  Intake 970 ml  Output 1050 ml  Net -80 ml   Filed Weights   03/20/22 0419 03/21/22 0431 03/22/22 0424  Weight: 130.8 kg 127.7 kg 130.6 kg    Examination:   Constitutional: NAD, AAOx3 HEENT: conjunctivae and lids normal, EOMI CV: No cyanosis.   RESP: normal respiratory effort, on 4L Extremities: tenderness to palpation over bottom of feet SKIN: warm, dry Neuro: II - XII grossly intact.   Psych: Normal mood and affect.     Data Reviewed: I have personally reviewed labs and imaging studies  Time spent: 25 minutes   Darlin Priestly, MD [facility] If 7PM-7AM, please contact night-coverage 03/22/2022, 7:17 PM

## 2022-03-22 NOTE — Progress Notes (Signed)
Occupational Therapy Treatment Patient Details Name: Bryan Mitchell MRN: [id number] DOB: 05-02-1961 Today's Date: 03/22/2022   History of present illness Patient is a 61 year old male with worsening acute on chronic hypoxic/hypercapnic respiratory failure requiring emergent intubation and mechanical ventilatory support. Suspected COPD exacerbation in the setting of HFpEF and possible pulmonary hypertension. Required intuatbion but is now extubated.   OT comments  Bryan Mitchell was seen for OT treatment on this date. Upon arrival to room pt reclined in bed, agreeable to tx. Pt requires MOD A x2 for bed mobility. MAX A don B shoes seated EOB, pt reports B foot pain and refuses shoes. Multiple standing attempts from various bed height, pt continues to be limited by sequencing / command following deficits for transfers - difficulty with anterior weight shifting and foot placement. MAX A x2 lateral scoot t/f along EOB. Pt making progress toward goals, will continue to follow POC. Discharge recommendation remains appropriate.     Recommendations for follow up therapy are one component of a multi-disciplinary discharge planning process, led by the attending physician.  Recommendations may be updated based on patient status, additional functional criteria and insurance authorization.    Follow Up Recommendations  Skilled nursing-short term rehab (<3 hours/day)    Assistance Recommended at Discharge Frequent or constant Supervision/Assistance  Patient can return home with the following  Two people to help with walking and/or transfers;Two people to help with bathing/dressing/bathroom   Equipment Recommendations  Hospital bed    Recommendations for Other Services      Precautions / Restrictions Precautions Precautions: Fall Restrictions Weight Bearing Restrictions: No       Mobility Bed Mobility Overal bed mobility: Needs Assistance Bed Mobility: Supine to Sit     Supine to sit: Mod assist,  +2 for physical assistance Sit to supine: Mod assist, +2 for physical assistance   General bed mobility comments: verbal cues for task initiation. increased time and effort required during bed mobility.    Transfers Overall transfer level: Needs assistance                Lateral/Scoot Transfers: Max assist, +2 physical assistance General transfer comment: multiple standing bouts attempted. patient was unable to stand with assistance despite lifting bed height and maximal cues for technique. patient has difficulty with sequencing with decreased weight bearing through LLE with foot off the floor at times. Pt unable to follow cues for proper sequencing of transfer     Balance Overall balance assessment: Needs assistance Sitting-balance support: Feet supported, Bilateral upper extremity supported Sitting balance-Leahy Scale: Fair                                     ADL either performed or assessed with clinical judgement   ADL Overall ADL's : Needs assistance/impaired                                       General ADL Comments: MAX A don B shoes, pt reports B foot pain and refuses shoes. MAX A x2 for lateral scoot t/f simulated toilet t/f      Cognition Arousal/Alertness: Awake/alert Behavior During Therapy: WFL for tasks assessed/performed Overall Cognitive Status: No family/caregiver present to determine baseline cognitive functioning Area of Impairment: Problem solving, Safety/judgement, Memory  Memory: Decreased short-term memory, Decreased recall of precautions Following Commands: Follows one step commands inconsistently, Follows one step commands with increased time Safety/Judgement: Decreased awareness of safety, Decreased awareness of deficits   Problem Solving: Decreased initiation, Difficulty sequencing, Requires verbal cues, Requires tactile cues, Slow processing General Comments: continues to be pleasant  and agreeable however less motivated to stand                   Pertinent Vitals/ Pain       Pain Assessment Pain Assessment: Faces Faces Pain Scale: Hurts little more Pain Location: bilateral feet (he is unable to be more specific) Pain Descriptors / Indicators: Sore Pain Intervention(s): Limited activity within patient's tolerance   Frequency  Min 2X/week        Progress Toward Goals  OT Goals(current goals can now be found in the care plan section)  Progress towards OT goals: Progressing toward goals  Acute Rehab OT Goals Patient Stated Goal: to walk OT Goal Formulation: With patient Time For Goal Achievement: 03/26/22 Potential to Achieve Goals: Fair ADL Goals Pt Will Perform Grooming: with set-up;with supervision;bed level Pt Will Perform Upper Body Dressing: with mod assist Pt Will Transfer to Toilet: with min assist;with +2 assist Pt/caregiver will Perform Home Exercise Program: Increased ROM;Increased strength;Both right and left upper extremity  Plan Discharge plan remains appropriate;Frequency remains appropriate    Co-evaluation    PT/OT/SLP Co-Evaluation/Treatment: Yes Reason for Co-Treatment: Necessary to address cognition/behavior during functional activity;For patient/therapist safety;To address functional/ADL transfers PT goals addressed during session: Mobility/safety with mobility OT goals addressed during session: ADL's and self-care      AM-PAC OT "6 Clicks" Daily Activity     Outcome Measure   Help from another person eating meals?: A Little Help from another person taking care of personal grooming?: A Little Help from another person toileting, which includes using toliet, bedpan, or urinal?: A Lot Help from another person bathing (including washing, rinsing, drying)?: A Lot Help from another person to put on and taking off regular upper body clothing?: A Lot Help from another person to put on and taking off regular lower body clothing?:  A Lot 6 Click Score: 14    End of Session    OT Visit Diagnosis: Other abnormalities of gait and mobility (R26.89);Muscle weakness (generalized) (M62.81)   Activity Tolerance Patient tolerated treatment well;Patient limited by fatigue   Patient Left in bed;with call bell/phone within reach;with bed alarm set   Nurse Communication          Time: 9604-5409 OT Time Calculation (min): 25 min  Charges: OT General Charges $OT Visit: 1 Visit OT Treatments $Self Care/Home Management : 8-22 mins  Kathie Dike, M.S. OTR/L  03/22/22, 12:47 PM  ascom [phone]

## 2022-03-22 NOTE — Progress Notes (Signed)
Physical Therapy Treatment Patient Details Name: Bryan Mitchell MRN: [id number] DOB: [birth year] Today's Date: 03/22/2022   History of Present Illness Patient is a 61 year old male with worsening acute on chronic hypoxic/hypercapnic respiratory failure requiring emergent intubation and mechanical ventilatory support. Suspected COPD exacerbation in the setting of HFpEF and possible pulmonary hypertension. Required intuatbion but is now extubated.    PT Comments    The patient was more cooperative and with less agitation today. He still needs frequent redirection and is easily distracted. He continues to require +2 person assistance for bed mobility. He was unable to stand today despite Max A + 2 person, multimodal cues, raising the bed height. Difficulty with sequencing and task initiation with functional mobility efforts. He was able to perform incremental scooting along the edge of bed but requires +2 person assistance for that as well. Recommend to use the lift for routine OOB to chair with staff assistance for upright conditioning. A written HEP was provided for supine LE exercises and left in the room per the family request. Recommend to continue PT to maximize independence and decrease caregiver burden. SNF recommended at discharge.    Recommendations for follow up therapy are one component of a multi-disciplinary discharge planning process, led by the attending physician.  Recommendations may be updated based on patient status, additional functional criteria and insurance authorization.  Follow Up Recommendations  Skilled nursing-short term rehab (<3 hours/day) Can patient physically be transported by private vehicle: No   Assistance Recommended at Discharge Frequent or constant Supervision/Assistance  Patient can return home with the following Two people to help with walking and/or transfers;Two people to help with bathing/dressing/bathroom;Help with stairs or ramp for entrance;Assist for  transportation;Assistance with feeding;Direct supervision/assist for medications management;Direct supervision/assist for financial management;Assistance with cooking/housework   Equipment Recommendations   (to be determined at next level of care)    Recommendations for Other Services       Precautions / Restrictions Precautions Precautions: Fall Restrictions Weight Bearing Restrictions: No     Mobility  Bed Mobility Overal bed mobility: Needs Assistance Bed Mobility: Supine to Sit     Supine to sit: Mod assist, +2 for physical assistance Sit to supine: Mod assist, +2 for physical assistance   General bed mobility comments: verbal cues for task initiation. increased time and effort required during bed mobility.    Transfers Overall transfer level: Needs assistance                Lateral/Scoot Transfers: Max assist, +2 physical assistance General transfer comment: multiple standing bouts attempted. patient was unable to stand with assistance despite lifting bed height and maximal cues for technique. patient has difficulty with sequencing with decreased weight bearing through LLE with foot off the floor at times. Max A +2 for scooting to the left with maximal cues for technique. patient continues to  be fatigued with minimal activity. the patient will need to use the lift for routine OOB to chair for upright conditioning with staff assistance.    Ambulation/Gait                   Stairs             Wheelchair Mobility    Modified Rankin (Stroke Patients Only)       Balance Overall balance assessment: Needs assistance Sitting-balance support: Feet supported, Bilateral upper extremity supported Sitting balance-Leahy Scale: Fair Sitting balance - Comments: close stand by assistance for safety with no external support required  Cognition Arousal/Alertness: Awake/alert Behavior During Therapy: WFL for  tasks assessed/performed Overall Cognitive Status: No family/caregiver present to determine baseline cognitive functioning Area of Impairment: Problem solving, Safety/judgement, Memory                     Memory: Decreased short-term memory, Decreased recall of precautions Following Commands: Follows one step commands inconsistently, Follows one step commands with increased time Safety/Judgement: Decreased awareness of safety, Decreased awareness of deficits   Problem Solving: Decreased initiation, Difficulty sequencing, Requires verbal cues, Requires tactile cues, Slow processing General Comments: difficulty with follow through of instructions during mobility. less agitated today and overall was cooperative with treatment. occasional tangential speech and easily distracted, frequent cues for redirection.        Exercises      General Comments General comments (skin integrity, edema, etc.): provided a written HEP for supine LE exercises and left in the room per sister's request- alerted patient and nurse.      Pertinent Vitals/Pain Pain Assessment Pain Assessment: Faces Faces Pain Scale: Hurts little more Pain Location: bilateral feet (he is unable to be more specific) Pain Descriptors / Indicators: Sore Pain Intervention(s): Limited activity within patient's tolerance, Monitored during session    Home Living                          Prior Function            PT Goals (current goals can now be found in the care plan section) Acute Rehab PT Goals Patient Stated Goal: to get better PT Goal Formulation: With patient Time For Goal Achievement: 03/28/22 Potential to Achieve Goals: Fair Progress towards PT goals: Progressing toward goals    Frequency    Min 2X/week      PT Plan Current plan remains appropriate    Co-evaluation PT/OT/SLP Co-Evaluation/Treatment: Yes Reason for Co-Treatment: Complexity of the patient's impairments (multi-system  involvement);For patient/therapist safety;To address functional/ADL transfers PT goals addressed during session: Mobility/safety with mobility        AM-PAC PT "6 Clicks" Mobility   Outcome Measure  Help needed turning from your back to your side while in a flat bed without using bedrails?: A Lot Help needed moving from lying on your back to sitting on the side of a flat bed without using bedrails?: A Lot Help needed moving to and from a bed to a chair (including a wheelchair)?: Total Help needed standing up from a chair using your arms (e.g., wheelchair or bedside chair)?: A Lot Help needed to walk in hospital room?: Total Help needed climbing 3-5 steps with a railing? : Total 6 Click Score: 9    End of Session         PT Visit Diagnosis: Unsteadiness on feet (R26.81);Muscle weakness (generalized) (M62.81)     Time: 1027-2536 PT Time Calculation (min) (ACUTE ONLY): 24 min  Charges:  $Therapeutic Activity: 8-22 mins                    Donna Bernard, PT, MPT    Ina Homes 03/22/2022, 11:17 AM

## 2022-03-22 NOTE — TOC Progression Note (Signed)
Transition of Care [facility]) - Progression Note    Patient Details  Name: Bryan Mitchell MRN: [id number] Date of Birth: May 04, 1961  Transition of Care [facility] CM/SW Contact  Truddie Hidden, RN Phone Number: 03/22/2022, 2:53 PM  Clinical Narrative:    Bed offers pending request   Expected Discharge Plan: Skilled Nursing Facility Barriers to Discharge: Continued Medical Work up  Expected Discharge Plan and Services Expected Discharge Plan: Skilled Nursing Facility   Discharge Planning Services: CM Consult Post Acute Care Choice: Skilled Nursing Facility Living arrangements for the past 2 months: Single Family Home                                       Social Determinants of Health (SDOH) Interventions    Readmission Risk Interventions     No data to display

## 2022-03-23 LAB — GLUCOSE, CAPILLARY
Glucose-Capillary: 135 mg/dL — ABNORMAL HIGH (ref 70–99)
Glucose-Capillary: 154 mg/dL — ABNORMAL HIGH (ref 70–99)
Glucose-Capillary: 191 mg/dL — ABNORMAL HIGH (ref 70–99)

## 2022-03-23 MED ORDER — NAPROXEN 500 MG PO TABS
500.0000 mg | ORAL_TABLET | Freq: Two times a day (BID) | ORAL | Status: AC
  Administered 2022-03-23 – 2022-03-28 (×9): 500 mg via ORAL
  Filled 2022-03-23 (×10): qty 1

## 2022-03-23 NOTE — Progress Notes (Signed)
  PROGRESS NOTE    Bryan Mitchell  GEX:[id number] DOB: March 14, 1961 DOA: 02/23/2022 PCP: Pcp, No  123A/123A-AA  LOS: 28 days   Brief hospital course: No notes on file  Assessment & Plan: Bryan Mitchell is a 61 y.o. M presenting to [facility] ED on 02/23/22 from home in a wheelchair with his sister for evaluation of worsening fatigue, poor appetite and SOB over the past 2 weeks. Per ED documentation, patient also complained of worsening bilateral leg swelling and pain.      CT angio negative for pulmonary edema/effusions, negative for PE.  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 --stable   Type II diabetes with morbid obesity -- hemoglobin A1c 6.5 --cont glargine 20u nightly --SSI   Generalized weakness, morbid obesity, wheelchair-bound -- PT/OT   Urinary retention with extended period of foley use given acuity of illness --foley removed 03/18/22, failed voiding trial needing I/O cath --re-inserted Foley on 9/13 --cont flomax   Intermittent confusion with agitation/?underlying cognitive decline vs hospital delirium (No mention of such in [facility]) --cont seroquel nightly (new)  morbid obesity, BMI 48 on presentation  suspected sleep apnea --BiPAP nightly  Plantar fascitis  --start naproxen    DVT prophylaxis: UU:[id number] Code Status: Full code  Family Communication:  Level of care: Telemetry Medical Dispo:    The patient is from: home Anticipated d/c is to: undetermined Anticipated d/c date is: undetermined   Subjective and Interval History:  Complained of food and back pain.   Objective: Vitals:   03/23/22 0802 03/23/22 0806 03/23/22 1225 03/23/22 1538  BP: (!) 139/96  122/69 107/75  Pulse: 99  100 90  Resp: 19  18 20   Temp: 97.8 F (36.6 C)  98.2 F (36.8 C) 98.4 F (36.9 C)  TempSrc:    Oral  SpO2: (!) 89% 90% 92% 94%  Weight:      Height:        Intake/Output Summary (Last 24 hours) at 03/23/2022 1914 Last data filed at 03/23/2022 1300 Gross per 24 hour  Intake 480 ml  Output 2900 ml  Net -2420 ml   Filed Weights   03/20/22 0419 03/21/22 0431 03/22/22 0424  Weight: 130.8 kg 127.7 kg 130.6 kg    Examination:   Constitutional: NAD, AAOx3 HEENT: conjunctivae and lids normal, EOMI CV: No cyanosis.   RESP: normal respiratory effort SKIN: warm, dry Neuro: II - XII grossly intact.     Data Reviewed: I have personally reviewed labs and imaging studies  Time spent: 25 minutes   Enzo Bi, MD [facility] If 7PM-7AM, please contact night-coverage 03/23/2022, 7:14 PM

## 2022-03-24 LAB — GLUCOSE, CAPILLARY
Glucose-Capillary: 128 mg/dL — ABNORMAL HIGH (ref 70–99)
Glucose-Capillary: 147 mg/dL — ABNORMAL HIGH (ref 70–99)
Glucose-Capillary: 115 mg/dL — ABNORMAL HIGH (ref 70–99)
Glucose-Capillary: 123 mg/dL — ABNORMAL HIGH (ref 70–99)
Glucose-Capillary: 114 mg/dL — ABNORMAL HIGH (ref 70–99)

## 2022-03-24 MED ORDER — CALCIUM CARBONATE ANTACID 500 MG PO CHEW
1.0000 | CHEWABLE_TABLET | Freq: Once | ORAL | Status: AC
  Administered 2022-03-24: 200 mg via ORAL
  Filled 2022-03-24: qty 1

## 2022-03-24 NOTE — Progress Notes (Signed)
  PROGRESS NOTE    [name]  OHY:[id number] DOB: [birth year] DOA: 02/23/2022 PCP: Pcp, No  123A/123A-AA  LOS: 29 days   Brief hospital course: No notes on file  Assessment & Plan: Bryan Mitchell is a 61 y.o. M presenting to [facility] ED on 02/23/22 from home in a wheelchair with his sister for evaluation of worsening fatigue, poor appetite and SOB over the past 2 weeks. Per ED documentation, patient also complained of worsening bilateral leg swelling and pain.      CT angio negative for pulmonary edema/effusions, negative for PE.  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 --stable   Type II diabetes with morbid obesity -- hemoglobin A1c 6.5 --cont glargine 20u nightly --SSI   Generalized weakness, morbid obesity, wheelchair-bound -- PT/OT   Urinary retention with extended period of foley use given acuity of illness --foley removed 03/18/22, failed voiding trial needing I/O cath --re-inserted Foley on 9/13 --cont flomax   Intermittent confusion with agitation/?underlying cognitive decline vs hospital delirium (No mention of such in [place]) --cont seroquel nightly (new)  morbid obesity, BMI 48 on presentation  suspected sleep apnea --BiPAP nightly  Plantar fascitis  --start naproxen    DVT prophylaxis: [id number] Code Status: Full code  Family Communication: sister Butch Penny updated on the phone  today. Level of care: Med-Surg Dispo:   The patient is from: home Anticipated d/c is to: undetermined Anticipated d/c date is: undetermined   Subjective and Interval History:  Pt reported maybe some improvement in his foot pain after starting Naproxen.   Objective: Vitals:   03/24/22 0552 03/24/22 0739 03/24/22 0837 03/24/22 1648  BP: 121/81  117/66 (!) 146/133  Pulse: (!) 104  (!) 110 92  Resp: 18  18 16   Temp: (!) 97.4 F (36.3 C)  98.3 F (36.8 C) 98.6 F (37 C)  TempSrc: Oral  Oral   SpO2: 92% 93% 92% (!) 87%  Weight:      Height:        Intake/Output Summary (Last 24 hours) at 03/24/2022 1814 Last data filed at 03/24/2022 0657 Gross per 24 hour  Intake --  Output 825 ml  Net -825 ml   Filed Weights   03/20/22 0419 03/21/22 0431 03/22/22 0424  Weight: 130.8 kg 127.7 kg 130.6 kg    Examination:   Constitutional: NAD, AAOx3 HEENT: conjunctivae and lids normal, EOMI CV: No cyanosis.   RESP: normal respiratory effort, on 5L Neuro: II - XII grossly intact.     Data Reviewed: I have personally reviewed labs and imaging studies  Time spent: 25 minutes   Enzo Bi, MD [facility] If 7PM-7AM, please contact night-coverage 03/24/2022, 6:14 PM

## 2022-03-25 ENCOUNTER — Other Ambulatory Visit: Payer: Self-pay

## 2022-03-25 LAB — GLUCOSE, CAPILLARY
Glucose-Capillary: 159 mg/dL — ABNORMAL HIGH (ref 70–99)
Glucose-Capillary: 126 mg/dL — ABNORMAL HIGH (ref 70–99)
Glucose-Capillary: 134 mg/dL — ABNORMAL HIGH (ref 70–99)
Glucose-Capillary: 139 mg/dL — ABNORMAL HIGH (ref 70–99)

## 2022-03-25 MED ORDER — BACITRACIN ZINC 500 UNIT/GM EX OINT
TOPICAL_OINTMENT | Freq: Two times a day (BID) | CUTANEOUS | Status: DC
  Filled 2022-03-25 (×13): qty 0.9

## 2022-03-25 NOTE — Progress Notes (Signed)
Occupational Therapy Re-Evaluation Patient Details Name: Bryan Mitchell MRN: [id number] DOB: 07-04-1961 Today's Date: 03/25/2022   History of present illness Patient is a 61 year old male with worsening acute on chronic hypoxic/hypercapnic respiratory failure requiring emergent intubation and mechanical ventilatory support. Suspected COPD exacerbation in the setting of HFpEF and possible pulmonary hypertension. Required intuatbion but is now extubated.   OT comments  Bryan Mitchell was seen for OT/PT co-treatment and re-evaluation on this date, goals updated to reflect pt progress. Upon arrival to room pt reclined in bed, pleasant and agreeable to tx. Pt requires no physical assist sup>sit, HOB elevated and heavy rail use. MAX A don B socks seated EOB. MOD A x2 sit<>stand at EOB, MIN A x2 step pivot t/f bed>chair. Reviewed HEP and seated therex with good recall of exercises. Pt making good progress, will continue to follow POC. Discharge recommendation remains appropriate.     Recommendations for follow up therapy are one component of a multi-disciplinary discharge planning process, led by the attending physician.  Recommendations may be updated based on patient status, additional functional criteria and insurance authorization.    Follow Up Recommendations  Skilled nursing-short term rehab (<3 hours/day)    Assistance Recommended at Discharge Frequent or constant Supervision/Assistance  Patient can return home with the following  Two people to help with walking and/or transfers;Two people to help with bathing/dressing/bathroom   Equipment Recommendations  Hospital bed    Recommendations for Other Services      Precautions / Restrictions Precautions Precautions: Fall Restrictions Weight Bearing Restrictions: No       Mobility Bed Mobility Overal bed mobility: Needs Assistance Bed Mobility: Supine to Sit     Supine to sit: Min guard     General bed mobility comments: HOB and rails  use    Transfers Overall transfer level: Needs assistance   Transfers: Sit to/from Stand, Bed to chair/wheelchair/BSC Sit to Stand: Mod assist, +2 physical assistance, From elevated surface     Step pivot transfers: Min assist, +2 physical assistance           Balance Overall balance assessment: Needs assistance Sitting-balance support: Single extremity supported, Feet supported Sitting balance-Leahy Scale: Fair     Standing balance support: Bilateral upper extremity supported Standing balance-Leahy Scale: Fair                             ADL either performed or assessed with clinical judgement   ADL Overall ADL's : Needs assistance/impaired                                       General ADL Comments: MAX A don B socks seated EOB. MIN - MOD A x2 fro simulated BSC t/f     Cognition Arousal/Alertness: Awake/alert Behavior During Therapy: WFL for tasks assessed/performed Overall Cognitive Status: Within Functional Limits for tasks assessed                                                     Pertinent Vitals/ Pain       Pain Assessment Pain Assessment: Faces Faces Pain Scale: Hurts a little bit Pain Location: L heel Pain Descriptors / Indicators: Sore Pain Intervention(s): Limited activity  within patient's tolerance, Repositioned   Frequency  Min 2X/week        Progress Toward Goals  OT Goals(current goals can now be found in the care plan section)  Progress towards OT goals: Progressing toward goals  Acute Rehab OT Goals Patient Stated Goal: to go home OT Goal Formulation: With patient Time For Goal Achievement: 03/26/22 Potential to Achieve Goals: Fair ADL Goals Pt Will Perform Grooming: with min assist;standing Pt Will Perform Upper Body Dressing: with min assist;standing Pt Will Transfer to Toilet: stand pivot transfer;bedside commode;with min guard assist Pt/caregiver will Perform Home Exercise  Program: Increased ROM;Increased strength;Both right and left upper extremity  Plan Discharge plan remains appropriate;Frequency remains appropriate    Co-evaluation    PT/OT/SLP Co-Evaluation/Treatment: Yes Reason for Co-Treatment: For patient/therapist safety;To address functional/ADL transfers PT goals addressed during session: Mobility/safety with mobility OT goals addressed during session: ADL's and self-care      AM-PAC OT "6 Clicks" Daily Activity     Outcome Measure   Help from another person eating meals?: A Little Help from another person taking care of personal grooming?: A Little Help from another person toileting, which includes using toliet, bedpan, or urinal?: A Lot Help from another person bathing (including washing, rinsing, drying)?: A Lot Help from another person to put on and taking off regular upper body clothing?: A Lot Help from another person to put on and taking off regular lower body clothing?: A Lot 6 Click Score: 14    End of Session    OT Visit Diagnosis: Other abnormalities of gait and mobility (R26.89);Muscle weakness (generalized) (M62.81)   Activity Tolerance Patient tolerated treatment well;Patient limited by fatigue   Patient Left in chair;with call bell/phone within reach;with chair alarm set   Nurse Communication Mobility status        Time: 5701-7793 OT Time Calculation (min): 23 min  Charges: OT General Charges $OT Visit: 1 Visit OT Evaluation $OT Re-eval: 1 Re-eval OT Treatments $Self Care/Home Management : 8-22 mins  Kathie Dike, M.S. OTR/L  03/25/22, 12:24 PM  ascom [phone]

## 2022-03-25 NOTE — Progress Notes (Signed)
  PROGRESS NOTE    Bryan Mitchell  JXB:[id number] DOB: Sep 02, 1960 DOA: 02/23/2022 PCP: Pcp, No  123A/123A-AA  LOS: 30 days   Brief hospital course: No notes on file  Assessment & Plan: Bryan Mitchell is a 61 y.o. M presenting to [facility] ED on 02/23/22 from home in a wheelchair with his sister for evaluation of worsening fatigue, poor appetite and SOB over the past 2 weeks. Per ED documentation, patient also complained of worsening bilateral leg swelling and pain.    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 --stable   Type II diabetes with morbid obesity -- hemoglobin A1c 6.5 --cont glargine 20u nightly --SSI   Generalized weakness, morbid obesity, wheelchair-bound -- PT/OT   Urinary retention with extended period of foley use given acuity of illness --foley removed 03/18/22, failed voiding trial needing I/O cath --re-inserted Foley on 9/13 --cont flomax   Intermittent confusion with agitation/?underlying cognitive decline vs hospital delirium (No mention of such in PMH) --cont seroquel nightly (new)  morbid obesity, BMI 48 on presentation  suspected sleep apnea --BiPAP nightly  Plantar fascitis  --cont naproxen for 5 days   DVT prophylaxis: YQ:MVHQION Code Status: Full code  Family Communication:  Level of care: Med-Surg Dispo:   The patient is from: home Anticipated d/c is to: undetermined Anticipated d/c date is: undetermined   Subjective and Interval  History:  Pt got up to the chair today.  Said his feet still hurt.  Pt said he wants to go home because he is concerned about hospital bills.   Objective: Vitals:   03/25/22 0343 03/25/22 0859 03/25/22 1615 03/25/22 1926  BP: 125/83 116/85 118/76 121/62  Pulse: 98 (!) 110 95 91  Resp: 18 16 20 16   Temp: 98.1 F (36.7 C) (!) 97.2 F (36.2 C) 97.9 F (36.6 C) 98 F (36.7 C)  TempSrc:   Oral   SpO2: 95% 93% 95% 92%  Weight:      Height:        Intake/Output Summary (Last 24 hours) at 03/25/2022 1946 Last data filed at 03/25/2022 1650 Gross per 24 hour  Intake 240 ml  Output 2420 ml  Net -2180 ml   Filed Weights   03/21/22 0431 03/22/22 0424 03/25/22 0339  Weight: 127.7 kg 130.6 kg 130.4 kg    Examination:   Constitutional: NAD, AAOx3 HEENT: conjunctivae and lids normal, EOMI CV: No cyanosis.   RESP: normal respiratory effort Neuro: II - XII grossly intact.     Data Reviewed: I have personally reviewed labs and imaging studies  Time spent: 25 minutes   Enzo Bi, MD [facility] If 7PM-7AM, please contact night-coverage 03/25/2022, 7:46 PM

## 2022-03-25 NOTE — Progress Notes (Signed)
Physical Therapy Treatment Patient Details Name: Bryan Mitchell MRN: [id number] DOB: 1960/07/16 Today's Date: 03/25/2022   History of Present Illness Patient is a 61 year old male with worsening acute on chronic hypoxic/hypercapnic respiratory failure requiring emergent intubation and mechanical ventilatory support. Suspected COPD exacerbation in the setting of HFpEF and possible pulmonary hypertension. Required intuatbion but is now extubated.    PT Comments    Patient was agreeable to PT and OT co-treatment. He was pleasant and cooperative throughout session today. He is making progress with functional independence today. Several transfers performed. +2 person assistance required for sit to stand transfers and for stand step transfer from bed to chair. Patient still fatigued with activity. Recommend to continue PT to maximize independence and facilitate return to prior level of function.    Recommendations for follow up therapy are one component of a multi-disciplinary discharge planning process, led by the attending physician.  Recommendations may be updated based on patient status, additional functional criteria and insurance authorization.  Follow Up Recommendations  Skilled nursing-short term rehab (<3 hours/day) Can patient physically be transported by private vehicle: No   Assistance Recommended at Discharge Frequent or constant Supervision/Assistance  Patient can return home with the following Two people to help with walking and/or transfers;Two people to help with bathing/dressing/bathroom;Help with stairs or ramp for entrance;Assist for transportation;Assistance with feeding;Direct supervision/assist for medications management;Direct supervision/assist for financial management;Assistance with cooking/housework   Equipment Recommendations   (to be determined)    Recommendations for Other Services       Precautions / Restrictions Precautions Precautions:  Fall Restrictions Weight Bearing Restrictions: No     Mobility  Bed Mobility Overal bed mobility: Needs Assistance Bed Mobility: Supine to Sit     Supine to sit: Min assist     General bed mobility comments: increased time and effort. occasional assistance for LE support    Transfers Overall transfer level: Needs assistance   Transfers: Sit to/from Stand Sit to Stand: Mod assist, +2 physical assistance, From elevated surface   Step pivot transfers: Min assist, +2 physical assistance       General transfer comment: verbal cues for hand placement and technique to faciltiate independence and safety with transfers. patient using the window ledge intermittently for support in standing. 2 bouts of sit to stand performed and one step transfer from bed to recliner chair (going to the right).    Ambulation/Gait               General Gait Details: unable to due to poor standing tolerance and generalized weakness   Stairs             Wheelchair Mobility    Modified Rankin (Stroke Patients Only)       Balance Overall balance assessment: Needs assistance Sitting-balance support: Single extremity supported, Feet supported Sitting balance-Leahy Scale: Fair     Standing balance support: Bilateral upper extremity supported Standing balance-Leahy Scale: Fair                              Cognition Arousal/Alertness: Awake/alert Behavior During Therapy: WFL for tasks assessed/performed Overall Cognitive Status: Within Functional Limits for tasks assessed                                 General Comments: no agitation today. patient is cooperative and able to follow single step commands with increased  time        Exercises      General Comments        Pertinent Vitals/Pain Pain Assessment Pain Assessment: Faces Faces Pain Scale: Hurts a little bit Pain Location: L heel Pain Descriptors / Indicators: Sore Pain  Intervention(s): Limited activity within patient's tolerance    Home Living                          Prior Function            PT Goals (current goals can now be found in the care plan section) Acute Rehab PT Goals Patient Stated Goal: to get better PT Goal Formulation: With patient Time For Goal Achievement: 03/28/22 Potential to Achieve Goals: Fair Progress towards PT goals: Progressing toward goals    Frequency    Min 2X/week      PT Plan Current plan remains appropriate    Co-evaluation   Reason for Co-Treatment: For patient/therapist safety;To address functional/ADL transfers PT goals addressed during session: Mobility/safety with mobility OT goals addressed during session: ADL's and self-care      AM-PAC PT "6 Clicks" Mobility   Outcome Measure  Help needed turning from your back to your side while in a flat bed without using bedrails?: A Lot Help needed moving from lying on your back to sitting on the side of a flat bed without using bedrails?: A Lot Help needed moving to and from a bed to a chair (including a wheelchair)?: Total Help needed standing up from a chair using your arms (e.g., wheelchair or bedside chair)?: Total Help needed to walk in hospital room?: Total Help needed climbing 3-5 steps with a railing? : Total 6 Click Score: 8    End of Session Equipment Utilized During Treatment: Oxygen Activity Tolerance: Patient tolerated treatment well Patient left: in chair;with call bell/phone within reach;with chair alarm set;with nursing/sitter in room Nurse Communication: Mobility status PT Visit Diagnosis: Unsteadiness on feet (R26.81);Muscle weakness (generalized) (M62.81)     Time: 5277-8242 PT Time Calculation (min) (ACUTE ONLY): 23 min  Charges:  $Therapeutic Activity: 8-22 mins                     Minna Merritts, PT, MPT    Percell Locus 03/25/2022, 1:35 PM

## 2022-03-26 LAB — GLUCOSE, CAPILLARY
Glucose-Capillary: 142 mg/dL — ABNORMAL HIGH (ref 70–99)
Glucose-Capillary: 137 mg/dL — ABNORMAL HIGH (ref 70–99)
Glucose-Capillary: 116 mg/dL — ABNORMAL HIGH (ref 70–99)
Glucose-Capillary: 135 mg/dL — ABNORMAL HIGH (ref 70–99)

## 2022-03-26 MED ORDER — DOCUSATE SODIUM 100 MG PO CAPS
100.0000 mg | ORAL_CAPSULE | Freq: Two times a day (BID) | ORAL | Status: DC
  Administered 2022-03-26 – 2022-04-12 (×25): 100 mg via ORAL
  Filled 2022-03-26 (×29): qty 1

## 2022-03-26 NOTE — Progress Notes (Addendum)
Occupational Therapy Treatment Patient Details Name: Bryan Mitchell MRN: [id number] DOB: August 07, 1960 Today's Date: 03/26/2022   History of present illness Patient is a 61 year old male with worsening acute on chronic hypoxic/hypercapnic respiratory failure requiring emergent intubation and mechanical ventilatory support. Suspected COPD exacerbation in the setting of HFpEF and possible pulmonary hypertension. Required intuatbion but is now extubated.   OT comments  Mr Sakuma was seen for OT treatment on this date. Upon arrival to room pt seated EOB with PT, pt eager and agreeable to tx. Pt requires MAX A don B socks seated EOB. Initial MIN A x2 + RW sit<>stand, improving to MIN A for 2 trials. Last 3 trials CGA only however cues for safety, requires BUE support static standing. Max standing tolerance 40 seconds. Pt making good progress toward goals, will continue to follow POC. Discharge recommendation remains appropriate.     Recommendations for follow up therapy are one component of a multi-disciplinary discharge planning process, led by the attending physician.  Recommendations may be updated based on patient status, additional functional criteria and insurance authorization.    Follow Up Recommendations  Skilled nursing-short term rehab (<3 hours/day)    Assistance Recommended at Discharge Frequent or constant Supervision/Assistance  Patient can return home with the following  Two people to help with walking and/or transfers;Two people to help with bathing/dressing/bathroom   Equipment Recommendations  Hospital bed    Recommendations for Other Services      Precautions / Restrictions Precautions Precautions: Fall Restrictions Weight Bearing Restrictions: No       Mobility Bed Mobility   Bed Mobility: Sit to Supine       Sit to supine: Min guard        Transfers Overall transfer level: Needs assistance Equipment used: Rolling walker (2 wheels) Transfers: Sit to/from  Stand Sit to Stand: Min assist, Min guard           General transfer comment: Initial MIN A x2 + RW sit<>stand, improving to MIN A x1 2 trials. last 3 trials CGA only however cues for safety     Balance Overall balance assessment: Needs assistance Sitting-balance support: Single extremity supported, Feet supported Sitting balance-Leahy Scale: Fair     Standing balance support: Bilateral upper extremity supported Standing balance-Leahy Scale: Fair Standing balance comment: using rolling walker for support in standing                           ADL either performed or assessed with clinical judgement   ADL Overall ADL's : Needs assistance/impaired                                       General ADL Comments: MAX A don B socks seated EOB. CGA + RW for simulated BSC t/f, requires BUE support static standing      Cognition Arousal/Alertness: Awake/alert Behavior During Therapy: WFL for tasks assessed/performed Overall Cognitive Status: Impaired/Different from baseline                           Safety/Judgement: Decreased awareness of safety, Decreased awareness of deficits     General Comments: poor safety awareness and decreased insight into deficits            Pertinent Vitals/ Pain       Pain Assessment Pain Assessment: Faces Faces  Pain Scale: Hurts a little bit Pain Location: bilateral feet Pain Descriptors / Indicators: Numbness, Pins and needles Pain Intervention(s): Limited activity within patient's tolerance   Frequency  Min 2X/week        Progress Toward Goals  OT Goals(current goals can now be found in the care plan section)  Progress towards OT goals: Progressing toward goals  Acute Rehab OT Goals Patient Stated Goal: to go home OT Goal Formulation: With patient Time For Goal Achievement: 04/08/22 Potential to Achieve Goals: Fair ADL Goals Pt Will Perform Grooming: with min assist;standing Pt Will Perform  Upper Body Dressing: with min assist;standing Pt Will Transfer to Toilet: stand pivot transfer;bedside commode;with min guard assist Pt/caregiver will Perform Home Exercise Program: Increased ROM;Increased strength;Both right and left upper extremity  Plan Discharge plan remains appropriate;Frequency remains appropriate    Co-evaluation    PT/OT/SLP Co-Evaluation/Treatment: Yes Reason for Co-Treatment: For patient/therapist safety;To address functional/ADL transfers PT goals addressed during session: Mobility/safety with mobility OT goals addressed during session: ADL's and self-care      AM-PAC OT "6 Clicks" Daily Activity     Outcome Measure   Help from another person eating meals?: A Little Help from another person taking care of personal grooming?: A Little Help from another person toileting, which includes using toliet, bedpan, or urinal?: A Lot Help from another person bathing (including washing, rinsing, drying)?: A Lot Help from another person to put on and taking off regular upper body clothing?: A Lot Help from another person to put on and taking off regular lower body clothing?: A Lot 6 Click Score: 14    End of Session    OT Visit Diagnosis: Other abnormalities of gait and mobility (R26.89);Muscle weakness (generalized) (M62.81)   Activity Tolerance Patient tolerated treatment well;Patient limited by fatigue   Patient Left with call bell/phone within reach;in bed;with bed alarm set   Nurse Communication          Time: 0093-8182 OT Time Calculation (min): 23 min  Charges: OT General Charges $OT Visit: 1 Visit OT Treatments $Self Care/Home Management : 8-22 mins  Dessie Coma, M.S. OTR/L  03/26/22, 1:08 PM  ascom [phone]

## 2022-03-26 NOTE — TOC Progression Note (Signed)
Transition of Care [facility]) - Progression Note    Patient Details  Name: Bryan Mitchell MRN: [id number] Date of Birth: 1960-11-03  Transition of Care [facility] CM/SW Contact  Laurena Slimmer, RN Phone Number: 03/26/2022, 4:05 PM  Clinical Narrative:    Spoke with patient at bedside. Patient inquired about food stamps and disability. Advised he or his family  would have to contact DSS.   Patient advised he currently has no bed offers.    Expected Discharge Plan: [place] Barriers to Discharge: Continued Medical Work up  Expected Discharge Plan and Services Expected Discharge Plan: [place]   Discharge Planning Services: CM Consult Post Acute Care Choice: [place] Living arrangements for the past 2 months: Single Family Home                                       Social Determinants of Health (SDOH) Interventions    Readmission Risk Interventions     No data to display

## 2022-03-26 NOTE — Progress Notes (Signed)
  PROGRESS NOTE    Bryan Mitchell  WER:[id number] DOB: 23-Aug-1960 DOA: 02/23/2022 PCP: Pcp, No  123A/123A-AA  LOS: 31 days   Brief hospital course: No notes on file  Assessment & Plan: Bryan Mitchell is a 61 y.o. M presenting to [facility] ED on 02/23/22 from home in a wheelchair with his sister for evaluation of worsening fatigue, poor appetite and SOB over the past 2 weeks. Per ED documentation, patient also complained of worsening bilateral leg swelling and pain.    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  was not on insulin PTA. --cont glargine 20u nightly --SSI   Generalized weakness, morbid obesity, wheelchair-bound -- PT/OT   Urinary retention with extended period of foley use given acuity of illness --foley removed 03/18/22, failed voiding trial needing I/O cath --re-inserted Foley on 9/13 --cont flomax   Intermittent confusion with agitation/?underlying cognitive decline vs hospital delirium (No mention of such in PMH) --cont seroquel nightly (new)  morbid obesity, BMI 48 on presentation  suspected sleep apnea --BiPAP nightly  Possible Plantar fascitis  --pt complains of pain in both feet, and tender to palpation on the bottom of both feet. --cont naproxen for 5 days   DVT prophylaxis: [id number] Code Status: Full code  Family Communication: sister updated on the phone today Level of care:  Med-Surg Dispo:   The patient is from: home Anticipated d/c is to: undetermined Anticipated d/c date is: undetermined   Subjective and Interval History:  Pt has been making slow progress with PT/OT.  Got up to chair today.   Objective: Vitals:   03/26/22 0440 03/26/22 0740 03/26/22 0745 03/26/22 1557  BP: 120/78 102/78  120/75  Pulse: 96 99  94  Resp: 18 18  17   Temp: 97.6 F (36.4 C) 97.8 F (36.6 C)  98.9 F (37.2 C)  TempSrc:      SpO2: 97% 94% 90% 94%  Weight:      Height:        Intake/Output Summary (Last 24 hours) at 03/26/2022 1943 Last data filed at 03/26/2022 1458 Gross per 24 hour  Intake 240 ml  Output 850 ml  Net -610 ml   Filed Weights   03/22/22 0424 03/25/22 0339 03/26/22 0436  Weight: 130.6 kg 130.4 kg 132.8 kg    Examination:   Constitutional: NAD, AAOx3 HEENT: conjunctivae and lids normal, EOMI CV: No cyanosis.   RESP: normal respiratory effort Neuro: II - XII grossly intact.   Psych: Normal mood and affect.      Data Reviewed: I have personally reviewed labs and imaging studies  Time spent: 25 minutes   Enzo Bi, MD [facility] If 7PM-7AM, please contact night-coverage 03/26/2022, 7:43 PM

## 2022-03-26 NOTE — Progress Notes (Signed)
Physical Therapy Treatment Patient Details Name: Zeddie Njie MRN: [id number] DOB: December 15, 1960 Today's Date: 03/26/2022   History of Present Illness Patient is a 61 year old male with worsening acute on chronic hypoxic/hypercapnic respiratory failure requiring emergent intubation and mechanical ventilatory support. Suspected COPD exacerbation in the setting of HFpEF and possible pulmonary hypertension. Required intuatbion but is now extubated.    PT Comments    Patient is making progress with functional independence. Patient needs only one person assistance now for bed mobility and sit to stand transfers. 5 standing bouts performed from bed at the lowest height. Min A initially for standing progressing to Min guard with repeat standing bouts. Standing tolerance of no more than 40 seconds. Unable to ambulate due to generalized LE weakness and limited standing tolerance. Encouraged patient to participate with home exercise program between therapy sessions to maximize strength for functional independence (handout is in the room). Overall cognition has improved, however patient continues to have decreased safety awareness and overestimates his physical abilities. Recommend to continue PT to maximize independence and facilitate return to prior level of function.    Recommendations for follow up therapy are one component of a multi-disciplinary discharge planning process, led by the attending physician.  Recommendations may be updated based on patient status, additional functional criteria and insurance authorization.  Follow Up Recommendations  Skilled nursing-short term rehab (<3 hours/day) Can patient physically be transported by private vehicle: No   Assistance Recommended at Discharge Frequent or constant Supervision/Assistance  Patient can return home with the following Two people to help with walking and/or transfers;Two people to help with bathing/dressing/bathroom;Help with stairs or ramp for  entrance;Assist for transportation;Assistance with feeding;Direct supervision/assist for medications management;Direct supervision/assist for financial management;Assistance with cooking/housework   Equipment Recommendations   (to be determined)    Recommendations for Other Services       Precautions / Restrictions Precautions Precautions: Fall Restrictions Weight Bearing Restrictions: No     Mobility  Bed Mobility Overal bed mobility: Needs Assistance Bed Mobility: Supine to Sit, Sit to Supine     Supine to sit: Min assist, Min guard Sit to supine: Min guard   General bed mobility comments: 2 bouts of bed mobility performed. first bout required Min A for trunk support to sit upright. second bout required only Min guard assistance. (the patient requested to lay down briefly to urinate)    Transfers Overall transfer level: Needs assistance Equipment used: Rolling walker (2 wheels) Transfers: Sit to/from Stand Sit to Stand: Min assist, Min guard           General transfer comment: 5 standing bouts performed. verbal cues for anterior weight shifting and technique to facilitate independence with lift off. patient perseverates on using crutches which is unsafe at this time. patient was agreeable to use the rolling walker with encouragement and education.    Ambulation/Gait             Pre-gait activities: emphasis on increasing standing tolerance with each standing bout for increase strength and to facilitate readiness for ambulation. the goal was 2 minutes. patient stood the longest bout of 40 seconds, other bouts limited to 10-15 seconds each. activity tolerance limited by LE weakness and fatigue with activity General Gait Details: unable to attempt safely due to poor standing tolerance and generalized weakness   Stairs             Wheelchair Mobility    Modified Rankin (Stroke Patients Only)       Balance Overall  balance assessment: Needs  assistance Sitting-balance support: Single extremity supported, Feet supported Sitting balance-Leahy Scale: Fair     Standing balance support: Bilateral upper extremity supported Standing balance-Leahy Scale: Fair Standing balance comment: using rolling walker for support in standing                            Cognition Arousal/Alertness: Awake/alert Behavior During Therapy: WFL for tasks assessed/performed Overall Cognitive Status: Impaired/Different from baseline                           Safety/Judgement: Decreased awareness of safety, Decreased awareness of deficits     General Comments: cooperative with treatment. he does have impaired safety awareness and needs safety cues with mobility. he overestimates his physical abilities frequently        Exercises General Exercises - Lower Extremity Ankle Circles/Pumps: AROM, Strengthening, Both, 10 reps, Seated Long Arc Quad: AROM, AAROM, Strengthening, Both, 10 reps, Seated (2 bouts of 10 reps each) Other Exercises Other Exercises: verbal and tactile cues for exercise technique    General Comments        Pertinent Vitals/Pain Pain Assessment Pain Assessment: Faces Faces Pain Scale: Hurts a little bit Pain Location: bilateral feet Pain Descriptors / Indicators: Numbness, Pins and needles Pain Intervention(s): Limited activity within patient's tolerance, Monitored during session, Repositioned    Home Living                          Prior Function            PT Goals (current goals can now be found in the care plan section) Acute Rehab PT Goals Patient Stated Goal: to go home PT Goal Formulation: With patient Time For Goal Achievement: 04/09/22 Potential to Achieve Goals: Fair Progress towards PT goals: Progressing toward goals    Frequency    Min 2X/week      PT Plan Other (comment) (plan of care updated as patient has met all goals. goals and plan of care updated  accordingly)    Co-evaluation PT/OT/SLP Co-Evaluation/Treatment: Yes (partial co-treat) Reason for Co-Treatment: For patient/therapist safety;Complexity of the patient's impairments (multi-system involvement) PT goals addressed during session: Mobility/safety with mobility        AM-PAC PT "6 Clicks" Mobility   Outcome Measure  Help needed turning from your back to your side while in a flat bed without using bedrails?: None Help needed moving from lying on your back to sitting on the side of a flat bed without using bedrails?: A Little Help needed moving to and from a bed to a chair (including a wheelchair)?: A Little Help needed standing up from a chair using your arms (e.g., wheelchair or bedside chair)?: Total Help needed to walk in hospital room?: Total Help needed climbing 3-5 steps with a railing? : Total 6 Click Score: 13    End of Session Equipment Utilized During Treatment: Oxygen Activity Tolerance: Patient tolerated treatment well;Patient limited by fatigue Patient left: in bed;with call bell/phone within reach;with bed alarm set Nurse Communication: Mobility status;Other (comment) (white board up to date with mobility status) PT Visit Diagnosis: Unsteadiness on feet (R26.81);Muscle weakness (generalized) (M62.81)     Time: 4158-3094 PT Time Calculation (min) (ACUTE ONLY): 47 min  Charges:  $Therapeutic Activity: 23-37 mins  Minna Merritts, PT, MPT    Percell Locus 03/26/2022, 12:38 PM

## 2022-03-27 LAB — GLUCOSE, CAPILLARY
Glucose-Capillary: 126 mg/dL — ABNORMAL HIGH (ref 70–99)
Glucose-Capillary: 177 mg/dL — ABNORMAL HIGH (ref 70–99)
Glucose-Capillary: 132 mg/dL — ABNORMAL HIGH (ref 70–99)
Glucose-Capillary: 224 mg/dL — ABNORMAL HIGH (ref 70–99)

## 2022-03-27 LAB — CBC
HCT: 54.9 % — ABNORMAL HIGH (ref 39.0–52.0)
Hemoglobin: 16.3 g/dL (ref 13.0–17.0)
MCH: 28.2 pg (ref 26.0–34.0)
MCHC: 29.7 g/dL — ABNORMAL LOW (ref 30.0–36.0)
MCV: 94.8 fL (ref 80.0–100.0)
Platelets: 195 10*3/uL (ref 150–400)
RBC: 5.79 MIL/uL (ref 4.22–5.81)
RDW: 14.7 % (ref 11.5–15.5)
WBC: 4.4 10*3/uL (ref 4.0–10.5)
nRBC: 0 % (ref 0.0–0.2)

## 2022-03-27 LAB — BASIC METABOLIC PANEL
Anion gap: 4 — ABNORMAL LOW (ref 5–15)
BUN: 20 mg/dL (ref 6–20)
CO2: 36 mmol/L — ABNORMAL HIGH (ref 22–32)
Calcium: 9.5 mg/dL (ref 8.9–10.3)
Chloride: 101 mmol/L (ref 98–111)
Creatinine, Ser: 0.49 mg/dL — ABNORMAL LOW (ref 0.61–1.24)
GFR, Estimated: >60 mL/min (ref >60)
Glucose, Bld: 138 mg/dL — ABNORMAL HIGH (ref 70–99)
Potassium: 4.1 mmol/L (ref 3.5–5.1)
Sodium: 141 mmol/L (ref 135–145)

## 2022-03-27 LAB — MAGNESIUM: Magnesium: 2 mg/dL (ref 1.7–2.4)

## 2022-03-27 NOTE — Plan of Care (Signed)
  Problem: Activity: Goal: Risk for activity intolerance will decrease Outcome: Progressing   Problem: Nutrition: Goal: Adequate nutrition will be maintained Outcome: Progressing   Problem: Coping: Goal: Level of anxiety will decrease Outcome: Progressing   Problem: Elimination: Goal: Will not experience complications related to urinary retention Outcome: Progressing   Problem: Safety: Goal: Ability to remain free from injury will improve Outcome: Progressing   

## 2022-03-27 NOTE — Progress Notes (Addendum)
Daily Progress Note   Patient Name: Bryan Mitchell       Date: 03/27/2022 DOB: 29-Oct-1960  Age: 61 y.o. MRN#: [id number] Attending Physician: Fritzi Mandes, MD Primary Care Physician: Pcp, No Admit Date: 02/23/2022  Reason for Consultation/Follow-up: Establishing goals of care  Subjective: Notes and labs reviewed. In to see patient. No family at bedside and she was finishing their visit.  Patient is improving and working towards discharge.  He denies complaint at this time other than to state that he is ready to go home.  He discusses that he has no children and is not married.  He lives on a farm that he inherited from his grandparents.  He tells me he has numerous living siblings.  Attempted to discuss goals of care.  He was able to tell me that he is an organ donor on his driver's license.  He states that he wants someone to be able to use his organs when he can no longer use them himself, but states he has not no hurry to leave this earth.  Attempted to discuss his thoughts on care moving forward and on an acceptable quality of life.  Discussed things in multiple different ways for clarification.  He initially states living in a vegetative state would not be acceptable, and then voices being thankful for his family wanting to do anything and everything to keep him alive as long as possible, including tracheostomy with ventilator dependence.  Upon discussing his thoughts on CPR and possible quality of life following CPR, he inquires if we are talking about him having a future stroke, and returns to discuss that he is an organ donor.  He also discusses his farm and his cars and who he would want to have what possessions.  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   Length of Stay: 32  Current Medications: Scheduled Meds:   bacitracin   Topical BID   budesonide (PULMICORT) nebulizer solution  0.25 mg Nebulization BID   Chlorhexidine Gluconate Cloth  6 each Topical Daily   docusate sodium  100 mg Oral BID   feeding supplement  237 mL Oral TID BM   fondaparinux (ARIXTRA) injection  2.5 mg Subcutaneous Q24H   insulin aspart  0-20 Units Subcutaneous TID AC & HS   insulin glargine-yfgn  20 Units Subcutaneous QHS   metoprolol tartrate  12.5 mg Oral BID   multivitamin with minerals  1 tablet Oral Daily   naproxen  500 mg Oral BID WC   nystatin  5 mL Oral QID   mouth rinse  15 mL Mouth Rinse 4 times per day   polyethylene glycol  17 g Oral Daily   QUEtiapine  25 mg Oral QHS   sodium chloride flush  10-40 mL Intracatheter Q12H   tamsulosin  0.4 mg Oral Daily    Continuous Infusions:   PRN Meds: acetaminophen, acetaminophen, bisacodyl, haloperidol lactate, ipratropium-albuterol, mouth rinse, sodium chloride flush  Physical Exam Pulmonary:     Effort: Pulmonary effort is normal.  Neurological:     Mental Status: He is alert.             Vital Signs: BP 130/87 (BP Location: Right Arm)   Pulse 66   Temp 97.7 F (36.5 C) (Oral)   Resp (!) 22 Comment: pt talking moving  Ht 5\' 8"  (1.727 m)   Wt 131.9 kg   SpO2 92%   BMI 44.21 kg/m  SpO2: SpO2: 92 % O2 Device: O2 Device: Nasal Cannula O2 Flow Rate: O2 Flow Rate (L/min): 4 L/min  Intake/output summary:  Intake/Output Summary (Last 24 hours) at 03/27/2022 1201 Last data filed at 03/27/2022 1129 Gross per 24 hour  Intake 240 ml  Output 2800 ml  Net -2560 ml   LBM: Last BM Date : 03/26/22 Baseline Weight: Weight: (!) 145.2 kg Most recent weight: Weight: 131.9 kg   Patient Active Problem List   Diagnosis Date Noted   Toxic metabolic encephalopathy    Anisocoria    Pressure injury of skin 03/06/2022   Acute hypoxemic respiratory failure (HCC)  02/23/2022   Acute respiratory failure (HCC) 02/23/2022   COPD exacerbation (HCC)    Type 2 diabetes mellitus with complication, without long-term current use of insulin (HCC)    Edema of lower extremity    Acute respiratory failure with hypoxia (HCC) 01/13/2018   Morbid obesity (HCC) 01/13/2018   Hypoxia 12/16/2016   Cellulitis and abscess of trunk 12/16/2016   Tobacco abuse 12/16/2016   Allergic reaction 12/16/2016   Polycythemia 12/16/2016    Palliative Care Assessment & Plan   Recommendations/Plan: Would recommend outpatient palliative to follow.  Code Status:    Code Status Orders  (From admission, onward)           Start     Ordered   02/23/22 2142  Full code  Continuous        02/23/22 2141           Code Status History     Date Active Date Inactive Code Status Order ID Comments User Context   02/23/2022 1902 02/23/2022 2141 Full Code 2142  [id number], MD ED   01/13/2018 0354 01/15/2018 1908 Full Code 03/18/2018  [id number], MD Inpatient   12/16/2016 0533 12/16/2016 1853 Full Code 02/15/2017  [id number], MD ED       Prognosis:  Unable to determine   Care plan was discussed with RN  Thank you for allowing the Palliative Medicine Team to assist in the care of this patient.   Clydie Braun, NP  Please contact Palliative Medicine Team phone at [phone] for questions and concerns.

## 2022-03-27 NOTE — Progress Notes (Signed)
[facility] [place] NAME: Bryan Mitchell    MR#:  [id number]  DATE OF BIRTH:  08-Sep-1960  SUBJECTIVE:  patient much alert and coherent. Denies any complaints. Will try to wean his oxygen to off. Working with physical therapy. No family at bedside.    VITALS:  Blood pressure 130/87, pulse 66, temperature 97.7 F (36.5 C), temperature source Oral, resp. rate (!) 22, height 5\' 8"  (1.727 m), weight 131.9 kg, SpO2 92 %.  PHYSICAL EXAMINATION:   GENERAL:  61 y.o.-year-old patient lying in the bed with no acute distress. Morbid obesity LUNGS: Normal breath sounds bilaterally, no wheezing, rales, rhonchi.  CARDIOVASCULAR: S1, S2 normal. No murmurs, rubs, or gallops.  ABDOMEN: Soft, nontender, nondistended. Bowel sounds present. Chronic Foley EXTREMITIES: No  edema b/l.    NEUROLOGIC: nonfocal  patient is alert and awake SKIN: No obvious rash, lesion, or ulcer.   LABORATORY PANEL:  CBC Recent Labs  Lab 03/27/22 0519  WBC 4.4  HGB 16.3  HCT 54.9*  PLT 195    Chemistries  Recent Labs  Lab 03/27/22 0519  NA 141  K 4.1  CL 101  CO2 36*  GLUCOSE 138*  BUN 20  CREATININE 0.49*  CALCIUM 9.5  MG 2.0    Assessment and Plan   Bryan Mitchell is a 61 y.o. M presenting to [facility] ED on 02/23/22 from home in a wheelchair with his sister for evaluation of worsening fatigue, poor appetite and SOB over the past 2 weeks. Per ED documentation, patient also complained of worsening bilateral leg swelling and pain.    Acute on chronic hypoxic and hypercapnia respiratory failure COPD exacerbation congestive heart failure diastolic rhinovirus infection-- resolved tobacco abuse -- patient now extubated on high flow nasal cannula oxygen, currently on 4L -- completed IV steroids, IV antibiotic -- diuresis as BP and renal function permits -- will try to wean patient down from oxygen. No respiratory distress.   Acute on chronic diastolic heart  failure pulmonary hypertension -- Echo shows EF 60 to [id number], grade 2 diastolic dysfunction, pulmonary hypertension -- Lasix held due to hypernatremia   Polycythemia secondary to chronic hypoxia --stable   Type II diabetes with morbid obesity -- hemoglobin A1c 6.5.  was not on insulin PTA. --cont glargine 20u nightly --SSI   Generalized weakness, morbid obesity, wheelchair-bound -- PT/OT-- patient working with PT OT however requires max assist   Urinary retention with extended period of foley use given acuity of illness --foley removed 03/18/22, failed voiding trial needing I/O cath --re-inserted Foley on 9/13 --cont flomax   Intermittent confusion with agitation/?underlying cognitive decline vs hospital delirium (No mention of such in PMH) --was on seroquel nightly (new)--will hold off since mentation has been stable. Resume if needed   morbid obesity, BMI 48 on presentation   suspected sleep apnea --BiPAP nightly   Possible Plantar fascitis  --pt complains of pain in both feet, and tender to palpation on the bottom of both feet. --cont naproxen for 5 days     DVT prophylaxis: [id number] Code Status: Full code  Family Communication: none today Level of care: Med-Surg Dispo:   The patient is from: home Anticipated d/c is to: undetermined Anticipated d/c date is: undetermined  No bed offers. Cont rehab inhouse    TOTAL TIME TAKING CARE OF THIS PATIENT: 25 minutes.  >50% time spent on counselling and coordination of care  Note: This dictation was prepared with Dragon dictation along with smaller  Company secretary. Any transcriptional errors that result from this process are unintentional.  Fritzi Mandes M.D    [facility]   CC: Primary care physician; Pcp, No

## 2022-03-27 NOTE — Progress Notes (Signed)
Nutrition Follow-up  DOCUMENTATION CODES:   Morbid obesity  INTERVENTION:   -Continue Ensure Enlive po TID, each supplement provides 350 kcal and 20 grams of protein -Continue MVI daily  NUTRITION DIAGNOSIS:   Inadequate oral intake related to inability to eat (pt sedated and ventilated) as evidenced by NPO status.  Progressing; advanced to PO diet on 03/14/22  GOAL:   Patient will meet greater than or equal to 90% of their needs  Progressing   MONITOR:   PO intake, Supplement acceptance, Labs, Weight trends, Skin, I & O's  REASON FOR ASSESSMENT:   Ventilator    ASSESSMENT:   61 y/o male with h/o COPD, DM, HTN, HLD, CHF, polycythemia and alpha gal who is admitted with rhinovirus, PNA and AMS.  9/12- s/p BSE- liberalized diet to regular  Reviewed I/O's: -2.3 L x 24 hours and -13.7 L since 03/13/22  UOP: 2.5 L x 24 hours   Pt working with therapy at time of visit.   Pt with improved oral intake. Noted meal completions 100%. Pt also consuming Ensure supplements.    Per TOC notes, awaiting SNF placement with LOG. Hopeful for continued improvement with participation in therapy.   Medications reviewed and include colace and miralax.   Labs reviewed: CBGS: 116-177 (inpatient orders for glycemic control are 0-20 units insulin aspart TID before meals and at bedtime).    Diet Order:   Diet Order             Diet regular Room service appropriate? Yes with Assist; Fluid consistency: Thin  Diet effective now                   EDUCATION NEEDS:   No education needs have been identified at this time  Skin:  Skin Assessment: Skin Integrity Issues: Skin Integrity Issues:: Other (Comment), Stage II, DTI DTI: lt lateral hand Stage II: lt hand Other: open wound lt thigh  Last BM:  03/26/22 (type 2)  Height:   Ht Readings from Last 1 Encounters:  02/23/22 5\' 8"  (1.727 m)    Weight:   Wt Readings from Last 1 Encounters:  03/27/22 131.9 kg    Ideal Body  Weight:  70 kg  BMI:  Body mass index is 44.21 kg/m.  Estimated Nutritional Needs:   Kcal:  0973-5329  Protein:  125-140 grams  Fluid:  > 2 L    Loistine Chance, RD, LDN, [place] Registered Dietitian II Certified Diabetes Care and Education Specialist Please refer to [facility] for RD and/or RD on-call/weekend/after hours pager

## 2022-03-27 NOTE — Progress Notes (Signed)
Physical Therapy Treatment Patient Details Name: Bryan Mitchell MRN: [id number] DOB: [birth year] Today's Date: 03/27/2022   History of Present Illness Patient is a 61 year old male with worsening acute on chronic hypoxic/hypercapnic respiratory failure requiring emergent intubation and mechanical ventilatory support. Suspected COPD exacerbation in the setting of HFpEF and possible pulmonary hypertension. Required intuatbion but is now extubated.   PT Comments    Patient is agreeable to PT and cooperative throughout session. The patient is making progress with functional independence and had already been out of bed to chair before PT arrival to room. The patient does continue to have decreased insight into his physical limitations and often overestimates his abilities. The patient increased his standing tolerance to 50 seconds today with heavy reliance  on rolling walker for UE support in standing. No physical assistance required for bed mobility or standing today, Min guard for safety. He complains of left foot numbness with decreased proprioception with weight bearing. Ambulation efforts limited by generalized weakness and fatigue with minimal activity in standing.  The discharge recommendation of SNF is still appropriate as patient lives alone with minimal assistance available from others per his report.  PT will continue to follow to maximize independence and decrease caregiver burden.     Recommendations for follow up therapy are one component of a multi-disciplinary discharge planning process, led by the attending physician.  Recommendations may be updated based on patient status, additional functional criteria and insurance authorization.  Follow Up Recommendations  Skilled nursing-short term rehab (<3 hours/day) Can patient physically be transported by private vehicle: No   Assistance Recommended at Discharge Frequent or constant Supervision/Assistance  Patient can return home with the  following Two people to help with walking and/or transfers;Two people to help with bathing/dressing/bathroom;Help with stairs or ramp for entrance;Assist for transportation;Assistance with feeding;Direct supervision/assist for medications management;Direct supervision/assist for financial management;Assistance with cooking/housework   Equipment Recommendations   (to be determined)    Recommendations for Other Services       Precautions / Restrictions Precautions Precautions: Fall Restrictions Weight Bearing Restrictions: No     Mobility  Bed Mobility Overal bed mobility: Needs Assistance Bed Mobility: Supine to Sit, Sit to Supine     Supine to sit: Min guard Sit to supine: Min guard   General bed mobility comments: increased time and effort with no physical assistance needed    Transfers Overall transfer level: Needs assistance Equipment used: Rolling walker (2 wheels) Transfers: Sit to/from Stand Sit to Stand: Min guard           General transfer comment: verbal cues for anterior weight shifting and increased independence with mobility    Ambulation/Gait             Pre-gait activities: encouragement for increasing standing tolerance in preparation for ambulation. patient was able to stand for 50 seconds today (the longest standing bout yet) with heavy reliance on rolling walker. patient reports the left foot is numb with decreased proprioception, also limiting ability to ambulate safely General Gait Details: unable to safely attempt, limited standing tolerance and generalized weakness   Stairs             Wheelchair Mobility    Modified Rankin (Stroke Patients Only)       Balance Overall balance assessment: Needs assistance Sitting-balance support: Single extremity supported, Feet supported Sitting balance-Leahy Scale: Fair     Standing balance support: Bilateral upper extremity supported Standing balance-Leahy Scale: Fair Standing balance  comment: heavily reliance on rolling  walker for support. no external support needed by therapist                            Cognition Arousal/Alertness: Awake/alert Behavior During Therapy: WFL for tasks assessed/performed Overall Cognitive Status: Within Functional Limits for tasks assessed                           Safety/Judgement: Decreased awareness of deficits, Decreased awareness of safety     General Comments: decreased insight into physical limitations and realistic expectations (patient wants to go home now and get on the tractor, take care of his cows, etc.)        Exercises      General Comments        Pertinent Vitals/Pain Pain Assessment Pain Assessment: Faces Faces Pain Scale: Hurts a little bit Pain Location: left foot Pain Descriptors / Indicators: Numbness, Pins and needles Pain Intervention(s): Limited activity within patient's tolerance, Monitored during session    Home Living                          Prior Function            PT Goals (current goals can now be found in the care plan section) Acute Rehab PT Goals Patient Stated Goal: to go home PT Goal Formulation: With patient Time For Goal Achievement: 04/09/22 Potential to Achieve Goals: Fair Progress towards PT goals: Progressing toward goals    Frequency    Min 2X/week      PT Plan Current plan remains appropriate    Co-evaluation              AM-PAC PT "6 Clicks" Mobility   Outcome Measure  Help needed turning from your back to your side while in a flat bed without using bedrails?: None Help needed moving from lying on your back to sitting on the side of a flat bed without using bedrails?: A Little Help needed moving to and from a bed to a chair (including a wheelchair)?: A Little Help needed standing up from a chair using your arms (e.g., wheelchair or bedside chair)?: A Little Help needed to walk in hospital room?: Total Help needed  climbing 3-5 steps with a railing? : Total 6 Click Score: 15    End of Session Equipment Utilized During Treatment: Oxygen Activity Tolerance: Patient tolerated treatment well Patient left: in bed;with call bell/phone within reach;with bed alarm set (Palliative Care in the room) Nurse Communication: Mobility status PT Visit Diagnosis: Unsteadiness on feet (R26.81);Muscle weakness (generalized) (M62.81)     Time: 3329-5188 PT Time Calculation (min) (ACUTE ONLY): 24 min  Charges:  $Therapeutic Activity: 23-37 mins                     Donna Bernard, PT, MPT    Bryan Mitchell 03/27/2022, 11:13 AM

## 2022-03-27 NOTE — Plan of Care (Signed)

## 2022-03-27 NOTE — Progress Notes (Signed)
Occupational Therapy Treatment Patient Details Name: Bryan Mitchell MRN: [id number] DOB: [birth year] Today's Date: 03/27/2022   History of present illness Patient is a 61 year old male with worsening acute on chronic hypoxic/hypercapnic respiratory failure requiring emergent intubation and mechanical ventilatory support. Suspected COPD exacerbation in the setting of HFpEF and possible pulmonary hypertension. Required intuatbion but is now extubated.   OT comments  Bryan Mitchell was seen for OT treatment on this date. Upon arrival to room pt reclined in bed, agreeable to tx. Pt requires SUPERVISION sup<>sit. MOD A don B shoes in sitting. Pt deferred standing citing fear of L ankle giving out - reports he cannot feel it however accurately locates light/deep pressure. Performed seated therex as described below, left in bed with all needs met. Pt making good progress toward goals, will continue to follow POC. Discharge recommendation remains appropriate.     Recommendations for follow up therapy are one component of a multi-disciplinary discharge planning process, led by the attending physician.  Recommendations may be updated based on patient status, additional functional criteria and insurance authorization.    Follow Up Recommendations  Skilled nursing-short term rehab (<3 hours/day)    Assistance Recommended at Discharge Frequent or constant Supervision/Assistance  Patient can return home with the following  Two people to help with walking and/or transfers;Two people to help with bathing/dressing/bathroom   Equipment Recommendations  Hospital bed    Recommendations for Other Services      Precautions / Restrictions Precautions Precautions: Fall Restrictions Weight Bearing Restrictions: No       Mobility Bed Mobility Overal bed mobility: Needs Assistance Bed Mobility: Supine to Sit, Sit to Supine     Supine to sit: Supervision Sit to supine: Supervision   General bed mobility  comments: bed controls and rail use only    Transfers                   General transfer comment: pt refused citing fear of L ankle giving out - reports he cannot feel it however accurately locates light/deep pressure     Balance Overall balance assessment: Needs assistance Sitting-balance support: Feet supported, Single extremity supported Sitting balance-Leahy Scale: Fair                                     ADL either performed or assessed with clinical judgement   ADL Overall ADL's : Needs assistance/impaired                                       General ADL Comments: MOD A don B shoes seated EOB.      Cognition Arousal/Alertness: Awake/alert Behavior During Therapy: WFL for tasks assessed/performed Overall Cognitive Status: Within Functional Limits for tasks assessed                                 General Comments: poor insight into deficits        Exercises Exercises: General Lower Extremity General Exercises - Lower Extremity Ankle Circles/Pumps: AROM, Strengthening, Both, 10 reps, Seated Long Arc Quad: AROM, Strengthening, Both, 10 reps, Seated Straight Leg Raises: AROM, Strengthening, Both, 10 reps, Supine Hip Flexion/Marching: AROM, Strengthening, Both, 10 reps, Seated            Pertinent Vitals/  Pain       Pain Assessment Pain Assessment: No/denies pain   Frequency  Min 2X/week        Progress Toward Goals  OT Goals(current goals can now be found in the care plan section)  Progress towards OT goals: Progressing toward goals  Acute Rehab OT Goals Patient Stated Goal: to go home OT Goal Formulation: With patient Time For Goal Achievement: 04/08/22 Potential to Achieve Goals: Fair ADL Goals Pt Will Perform Grooming: with min assist;standing Pt Will Perform Upper Body Dressing: with min assist;standing Pt Will Transfer to Toilet: stand pivot transfer;bedside commode;with min guard  assist Pt/caregiver will Perform Home Exercise Program: Increased ROM;Increased strength;Both right and left upper extremity  Plan Discharge plan remains appropriate;Frequency remains appropriate    Co-evaluation                 AM-PAC OT "6 Clicks" Daily Activity     Outcome Measure   Help from another person eating meals?: A Little Help from another person taking care of personal grooming?: A Little Help from another person toileting, which includes using toliet, bedpan, or urinal?: A Lot Help from another person bathing (including washing, rinsing, drying)?: A Little Help from another person to put on and taking off regular upper body clothing?: A Little Help from another person to put on and taking off regular lower body clothing?: A Lot 6 Click Score: 16    End of Session    OT Visit Diagnosis: Other abnormalities of gait and mobility (R26.89);Muscle weakness (generalized) (M62.81)   Activity Tolerance Patient tolerated treatment well;Patient limited by fatigue   Patient Left in bed;with call bell/phone within reach;with bed alarm set   Nurse Communication          Time: 1027-2536 OT Time Calculation (min): 12 min  Charges: OT General Charges $OT Visit: 1 Visit OT Treatments $Therapeutic Exercise: 8-22 mins  Dessie Coma, M.S. OTR/L  03/27/22, 2:02 PM  ascom [phone]

## 2022-03-28 ENCOUNTER — Other Ambulatory Visit (HOSPITAL_COMMUNITY): Payer: Self-pay

## 2022-03-28 LAB — GLUCOSE, CAPILLARY
Glucose-Capillary: 137 mg/dL — ABNORMAL HIGH (ref 70–99)
Glucose-Capillary: 160 mg/dL — ABNORMAL HIGH (ref 70–99)
Glucose-Capillary: 197 mg/dL — ABNORMAL HIGH (ref 70–99)
Glucose-Capillary: 144 mg/dL — ABNORMAL HIGH (ref 70–99)

## 2022-03-28 LAB — URIC ACID: Uric Acid, Serum: 4.8 mg/dL (ref 3.7–8.6)

## 2022-03-28 MED ORDER — INSULIN ASPART 100 UNIT/ML IJ SOLN
0.0000 [IU] | Freq: Every day | INTRAMUSCULAR | Status: DC
  Administered 2022-04-11: 2 [IU] via SUBCUTANEOUS
  Filled 2022-03-28 (×2): qty 1

## 2022-03-28 MED ORDER — PREDNISONE 20 MG PO TABS
20.0000 mg | ORAL_TABLET | Freq: Every day | ORAL | Status: AC
  Administered 2022-03-28 – 2022-04-01 (×5): 20 mg via ORAL
  Filled 2022-03-28 (×5): qty 1

## 2022-03-28 MED ORDER — INSULIN ASPART 100 UNIT/ML IJ SOLN
0.0000 [IU] | Freq: Three times a day (TID) | INTRAMUSCULAR | Status: DC
  Administered 2022-03-28 – 2022-03-29 (×3): 2 [IU] via SUBCUTANEOUS
  Administered 2022-03-29: 1 [IU] via SUBCUTANEOUS
  Administered 2022-03-29 – 2022-03-30 (×2): 2 [IU] via SUBCUTANEOUS
  Administered 2022-03-30: 1 [IU] via SUBCUTANEOUS
  Administered 2022-03-30: 2 [IU] via SUBCUTANEOUS
  Administered 2022-03-31: 1 [IU] via SUBCUTANEOUS
  Administered 2022-03-31: 2 [IU] via SUBCUTANEOUS
  Administered 2022-03-31: 1 [IU] via SUBCUTANEOUS
  Administered 2022-04-01 (×2): 2 [IU] via SUBCUTANEOUS
  Administered 2022-04-02 – 2022-04-03 (×5): 1 [IU] via SUBCUTANEOUS
  Administered 2022-04-03: 2 [IU] via SUBCUTANEOUS
  Administered 2022-04-06: 1 [IU] via SUBCUTANEOUS
  Administered 2022-04-06: 3 [IU] via SUBCUTANEOUS
  Administered 2022-04-07: 1 [IU] via SUBCUTANEOUS
  Administered 2022-04-10: 3 [IU] via SUBCUTANEOUS
  Administered 2022-04-11: 2 [IU] via SUBCUTANEOUS
  Administered 2022-04-11: 5 [IU] via SUBCUTANEOUS
  Administered 2022-04-12 (×2): 2 [IU] via SUBCUTANEOUS
  Filled 2022-03-28 (×28): qty 1

## 2022-03-28 MED ORDER — METFORMIN HCL 850 MG PO TABS
850.0000 mg | ORAL_TABLET | Freq: Two times a day (BID) | ORAL | Status: DC
  Administered 2022-03-28 – 2022-04-12 (×18): 850 mg via ORAL
  Filled 2022-03-28 (×32): qty 1

## 2022-03-28 MED ORDER — INSULIN GLARGINE-YFGN 100 UNIT/ML ~~LOC~~ SOLN: 10.0000 [IU] | Freq: Every day | SUBCUTANEOUS | Status: DC

## 2022-03-28 NOTE — Progress Notes (Signed)
[name] [place] NAME: Bryan Mitchell    MR#:  [id number]  DATE OF BIRTH:  [birth year]  SUBJECTIVE:  patient much alert and coherent. Denies any complaints. Will try to wean his oxygen to off. No family at bedside. Pt refuses SSI if sugars <150 per RN Appears motivated to work with therapy   VITALS:  Blood pressure 123/79, pulse (!) 102, temperature 97.8 F (36.6 C), resp. rate 20, height 5\' 8"  (1.727 m), weight 132.8 kg, SpO2 96 %.  PHYSICAL EXAMINATION:   GENERAL:  61 y.o.-year-old patient lying in the bed with no acute distress. Morbid obesity LUNGS: Normal breath sounds bilaterally, no wheezing, rales, rhonchi.  CARDIOVASCULAR: S1, S2 normal. No murmurs, rubs, or gallops.  ABDOMEN: Soft, nontender, nondistended. Bowel sounds present. Chronic Foley EXTREMITIES: No  edema b/l.    NEUROLOGIC: nonfocal  patient is alert and awake SKIN: No obvious rash, lesion, or ulcer.   LABORATORY PANEL:  CBC Recent Labs  Lab 03/27/22 0519  WBC 4.4  HGB 16.3  HCT 54.9*  PLT 195     Chemistries  Recent Labs  Lab 03/27/22 0519  NA 141  K 4.1  CL 101  CO2 36*  GLUCOSE 138*  BUN 20  CREATININE 0.49*  CALCIUM 9.5  MG 2.0     Assessment and Plan   Bryan Mitchell is a 61 y.o. M presenting to [facility] ED on 02/23/22 from home in a wheelchair with his sister for evaluation of worsening fatigue, poor appetite and SOB over the past 2 weeks. Per ED documentation, patient also complained of worsening bilateral leg swelling and pain.    Acute on chronic hypoxic and hypercapnia respiratory failure COPD exacerbation congestive heart failure diastolic rhinovirus infection-- resolved tobacco abuse -- patient now extubated on high flow nasal cannula oxygen, currently on 4L -- completed IV steroids, IV antibiotic -- diuresis as BP and renal function permits -- will try to wean patient down from oxygen. No respiratory distress.   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  was not on insulin PTA. --cont glargine 20u nightly--A1c 6.5% --sugars stable. Will change to Metformin 850 mg bid and see how he does.  --SSI   Generalized weakness, morbid obesity, wheelchair-bound -- PT/OT-- patient working with PT OT however requires max assist   Urinary retention with extended period of foley use given acuity of illness --foley removed 03/18/22, failed voiding trial needing I/O cath --re-inserted Foley on 9/13 --cont flomax   Intermittent confusion with agitation/?underlying cognitive decline vs hospital delirium (No mention of such in [place]) --was on seroquel nightly (new)--will hold off since mentation has been stable. Resume if needed   morbid obesity, BMI 48 on presentation   suspected sleep apnea --BiPAP nightly   Possible Plantar fascitis  --pt complains of pain in both feet, and tender to palpation on the bottom of both feet. --cont naproxen for 5 days     DVT prophylaxis: [id number] Code Status: Full code  Family Communication: none today Level of care: Med-Surg Dispo:   The patient is from: home Anticipated d/c is to: undetermined Anticipated d/c date is: undetermined  No bed offers. Cont rehab inhouse    TOTAL TIME TAKING CARE OF  THIS PATIENT: 25 minutes.  >50% time spent on counselling and coordination of care  Note: This dictation was prepared with Dragon dictation along with smaller phrase technology. Any transcriptional errors that result from this process are unintentional.  Fritzi Mandes M.D    [facility]   CC: Primary care physician; Pcp, No

## 2022-03-28 NOTE — Progress Notes (Signed)
Occupational Therapy Treatment Patient Details Name: Tahji Clovis MRN: [id number] DOB: 08-Feb-1961 Today's Date: 03/28/2022   History of present illness Patient is a 61 year old male with worsening acute on chronic hypoxic/hypercapnic respiratory failure requiring emergent intubation and mechanical ventilatory support. Suspected COPD exacerbation in the setting of HFpEF and possible pulmonary hypertension. Required intuatbion but is now extubated.   OT comments  Mr Siefring was seen for OT treatment on this date. Upon arrival to room pt reclined in bed, agreeable to tx. Pt continues to complain of pins and needles pain in L foot (MD notified) and states he is unable to stand on it, multiple trials from  bed height, ultimately requires MIN A + RW sit<>stand. MOD I bed mobility. Pt making progress toward goals, will continue to follow POC. Discharge recommendation remains appropriate.     Recommendations for follow up therapy are one component of a multi-disciplinary discharge planning process, led by the attending physician.  Recommendations may be updated based on patient status, additional functional criteria and insurance authorization.    Follow Up Recommendations  Skilled nursing-short term rehab (<3 hours/day)    Assistance Recommended at Discharge Frequent or constant Supervision/Assistance  Patient can return home with the following  A lot of help with walking and/or transfers;A lot of help with bathing/dressing/bathroom;Help with stairs or ramp for entrance   Equipment Recommendations  BSC/3in1    Recommendations for Other Services      Precautions / Restrictions Precautions Precautions: Fall Restrictions Weight Bearing Restrictions: No       Mobility Bed Mobility Overal bed mobility: Modified Independent             General bed mobility comments: bed controls and rail use only    Transfers Overall transfer level: Needs assistance Equipment used: Rolling walker  (2 wheels) Transfers: Sit to/from Stand Sit to Stand: Min assist           General transfer comment: increased assist 2/2 L foot pain     Balance Overall balance assessment: Needs assistance Sitting-balance support: No upper extremity supported, Feet supported Sitting balance-Leahy Scale: Good     Standing balance support: Bilateral upper extremity supported Standing balance-Leahy Scale: Fair                             ADL either performed or assessed with clinical judgement   ADL Overall ADL's : Needs assistance/impaired                                       General ADL Comments: MAX A don B socks seated EOB.      Cognition Arousal/Alertness: Awake/alert Behavior During Therapy: WFL for tasks assessed/performed Overall Cognitive Status: Within Functional Limits for tasks assessed                                 General Comments: pleasant and agreeable        Pertinent Vitals/ Pain       Pain Assessment Pain Assessment: Faces Faces Pain Scale: Hurts even more Pain Location: L foot Pain Descriptors / Indicators: Pins and needles Pain Intervention(s): Limited activity within patient's tolerance, Repositioned   Frequency  Min 2X/week        Progress Toward Goals  OT Goals(current goals can now be found  in the care plan section)  Progress towards OT goals: Progressing toward goals  Acute Rehab OT Goals Patient Stated Goal: to go home OT Goal Formulation: With patient Time For Goal Achievement: 04/08/22 Potential to Achieve Goals: Fair ADL Goals Pt Will Perform Grooming: with min assist;standing Pt Will Perform Upper Body Dressing: with min assist;standing Pt Will Transfer to Toilet: stand pivot transfer;bedside commode;with min guard assist Pt/caregiver will Perform Home Exercise Program: Increased ROM;Increased strength;Both right and left upper extremity  Plan Discharge plan remains appropriate;Frequency  remains appropriate    Co-evaluation                 AM-PAC OT "6 Clicks" Daily Activity     Outcome Measure   Help from another person eating meals?: None Help from another person taking care of personal grooming?: A Little Help from another person toileting, which includes using toliet, bedpan, or urinal?: A Lot Help from another person bathing (including washing, rinsing, drying)?: A Little Help from another person to put on and taking off regular upper body clothing?: A Little Help from another person to put on and taking off regular lower body clothing?: A Lot 6 Click Score: 17    End of Session Equipment Utilized During Treatment: Rolling walker (2 wheels)  OT Visit Diagnosis: Other abnormalities of gait and mobility (R26.89);Muscle weakness (generalized) (M62.81)   Activity Tolerance Patient tolerated treatment well;Patient limited by pain   Patient Left in bed;with call bell/phone within reach;with bed alarm set   Nurse Communication          Time: [phone] OT Time Calculation (min): 20 min  Charges: OT General Charges $OT Visit: 1 Visit OT Treatments $Therapeutic Activity: 8-22 mins  Dessie Coma, M.S. OTR/L  03/28/22, 9:15 AM  ascom [phone]

## 2022-03-28 NOTE — Plan of Care (Signed)
  Problem: Activity: Goal: Risk for activity intolerance will decrease Outcome: Progressing   Problem: Nutrition: Goal: Adequate nutrition will be maintained Outcome: Progressing   Problem: Coping: Goal: Level of anxiety will decrease Outcome: Progressing   Problem: Pain Managment: Goal: General experience of comfort will improve Outcome: Progressing   

## 2022-03-28 NOTE — Progress Notes (Signed)
Physical Therapy Treatment Patient Details Name: Bryan Mitchell MRN: [id number] DOB: 26-May-1961 Today's Date: 03/28/2022   History of Present Illness Patient is a 61 year old male with worsening acute on chronic hypoxic/hypercapnic respiratory failure requiring emergent intubation and mechanical ventilatory support. Suspected COPD exacerbation in the setting of HFpEF and possible pulmonary hypertension. Required intuatbion but is now extubated.    PT Comments    Patient agreeable to in bed exercises for strengthening. He declined OOB activity as he has already stood multiple times today and just getting back to bed from the bed side commode. He still complains of LE numbness with pins and needles sensation. Assisted patient with lower body exercise against lightly graded resistance as appropriate. Encourage patient to participate with written HEP that was left on the bed side table. PT will continue to follow.     Recommendations for follow up therapy are one component of a multi-disciplinary discharge planning process, led by the attending physician.  Recommendations may be updated based on patient status, additional functional criteria and insurance authorization.  Follow Up Recommendations  Skilled nursing-short term rehab (<3 hours/day) Can patient physically be transported by private vehicle: No   Assistance Recommended at Discharge Frequent or constant Supervision/Assistance  Patient can return home with the following Two people to help with walking and/or transfers;Two people to help with bathing/dressing/bathroom;Help with stairs or ramp for entrance;Assist for transportation;Assistance with feeding;Direct supervision/assist for medications management;Direct supervision/assist for financial management;Assistance with cooking/housework   Equipment Recommendations  Other (comment) (to be determined)    Recommendations for Other Services       Precautions / Restrictions  Precautions Precautions: Fall Restrictions Weight Bearing Restrictions: No     Mobility  Bed Mobility Overal bed mobility: Modified Independent             General bed mobility comments: for repositioning in bed, no physical assistance required. patient declined sitting up due to foot pain and just having got back to bed from bed side commode with staff assistance.    Transfers                   General transfer comment: patient declined, has been OOB multiple times today already    Ambulation/Gait                   Stairs             Wheelchair Mobility    Modified Rankin (Stroke Patients Only)       Balance                                            Cognition Arousal/Alertness: Awake/alert Behavior During Therapy: WFL for tasks assessed/performed Overall Cognitive Status: Within Functional Limits for tasks assessed                                 General Comments: cooperative during session        Exercises General Exercises - Lower Extremity Short Arc Quad: AROM, Strengthening, Both, 10 reps, Supine Heel Slides: AAROM, Strengthening, Both, 10 reps, Supine (against lightly graded resistance) Hip ABduction/ADduction: AAROM, Strengthening, Both, 10 reps, Supine Straight Leg Raises: AROM, Strengthening, Both, 10 reps, Supine Other Exercises Other Exercises: verbal and tactile cues for exercise technique for strengthening. handout for HEP in the  room and placed on the bed side table. I have encouraged patient to perform between therapy sessions to maximize strength for functional independence    General Comments        Pertinent Vitals/Pain Pain Assessment Pain Assessment: Faces Faces Pain Scale: Hurts little more Pain Location: L foot Pain Descriptors / Indicators: Pins and needles Pain Intervention(s): Limited activity within patient's tolerance, Monitored during session    Home Living                           Prior Function            PT Goals (current goals can now be found in the care plan section) Acute Rehab PT Goals PT Goal Formulation: With patient Time For Goal Achievement: 04/09/22 Potential to Achieve Goals: Fair Progress towards PT goals: Progressing toward goals    Frequency    Min 2X/week      PT Plan Current plan remains appropriate    Co-evaluation              AM-PAC PT "6 Clicks" Mobility   Outcome Measure  Help needed turning from your back to your side while in a flat bed without using bedrails?: None Help needed moving from lying on your back to sitting on the side of a flat bed without using bedrails?: A Little Help needed moving to and from a bed to a chair (including a wheelchair)?: A Little Help needed standing up from a chair using your arms (e.g., wheelchair or bedside chair)?: A Little Help needed to walk in hospital room?: Total Help needed climbing 3-5 steps with a railing? : Total 6 Click Score: 15    End of Session         PT Visit Diagnosis: Unsteadiness on feet (R26.81);Muscle weakness (generalized) (M62.81)     Time: 0349-1791 PT Time Calculation (min) (ACUTE ONLY): 16 min  Charges:  $Therapeutic Exercise: 8-22 mins                     Donna Bernard, PT, MPT    Ina Homes 03/28/2022, 1:17 PM

## 2022-03-28 NOTE — Progress Notes (Signed)
Patient states he does not want to get out of bed to chair as the current recliner is uncomfortable for him.  Bariatric recliner has been ordered; awaiting delivery.

## 2022-03-29 ENCOUNTER — Inpatient Hospital Stay: Payer: Medicaid Other

## 2022-03-29 LAB — GLUCOSE, CAPILLARY
Glucose-Capillary: 140 mg/dL — ABNORMAL HIGH (ref 70–99)
Glucose-Capillary: 151 mg/dL — ABNORMAL HIGH (ref 70–99)
Glucose-Capillary: 195 mg/dL — ABNORMAL HIGH (ref 70–99)
Glucose-Capillary: 151 mg/dL — ABNORMAL HIGH (ref 70–99)

## 2022-03-29 MED ORDER — ALUM & MAG HYDROXIDE-SIMETH 200-200-20 MG/5ML PO SUSP
30.0000 mL | ORAL | Status: DC | PRN
  Administered 2022-03-29 – 2022-04-10 (×5): 30 mL via ORAL
  Filled 2022-03-29 (×5): qty 30

## 2022-03-29 MED ORDER — FUROSEMIDE 10 MG/ML IJ SOLN
20.0000 mg | Freq: Once | INTRAMUSCULAR | Status: AC
  Administered 2022-03-29: 20 mg via INTRAVENOUS
  Filled 2022-03-29: qty 2

## 2022-03-29 NOTE — Progress Notes (Signed)
Physical Therapy Treatment Patient Details Name: Bryan Mitchell MRN: [id number] DOB: 15-Oct-1960 Today's Date: 03/29/2022   History of Present Illness Patient is a 61 year old male with worsening acute on chronic hypoxic/hypercapnic respiratory failure requiring emergent intubation and mechanical ventilatory support. Suspected COPD exacerbation in the setting of HFpEF and possible pulmonary hypertension. Required intuatbion but is now extubated.   PT Comments    The patient is agreeable to PT session. He reports continued numbness in bilateral feet (left more than right). Therapist donned patient's shoes today prior to mobilizing. His sister Lupita Leash brought patient's axillary crutches from home and patient is insistent on using these with standing despite education that the rolling walker is safer. He was able to take one step with the left foot relying heavily on crutches for support, but has difficulty with weight bearing through the left foot for advancement  of RLE due to weakness. +2 person for step pivot transfer from bed to chair using crutches. He participated also with standing exercises today for LLE with external support required to maintain standing balance with crutches. He remains limited by weakness and fatigue with activity. Standing tolerance of only 30 seconds this session. He is making progress, but would be unsafe to go home alone at this time. SNF is still recommended if possible. PT will continue to follow.    Recommendations for follow up therapy are one component of a multi-disciplinary discharge planning process, led by the attending physician.  Recommendations may be updated based on patient status, additional functional criteria and insurance authorization.  Follow Up Recommendations  Skilled nursing-short term rehab (<3 hours/day) Can patient physically be transported by private vehicle: No   Assistance Recommended at Discharge Frequent or constant Supervision/Assistance   Patient can return home with the following Two people to help with walking and/or transfers;Two people to help with bathing/dressing/bathroom;Help with stairs or ramp for entrance;Assist for transportation;Assistance with feeding;Direct supervision/assist for medications management;Direct supervision/assist for financial management;Assistance with cooking/housework   Equipment Recommendations   (to be determined at next level of care)    Recommendations for Other Services       Precautions / Restrictions Precautions Precautions: Fall Restrictions Weight Bearing Restrictions: No     Mobility  Bed Mobility Overal bed mobility: Modified Independent Bed Mobility: Supine to Sit     Supine to sit: Modified independent (Device/Increase time)          Transfers Overall transfer level: Needs assistance Equipment used: Crutches Transfers: Sit to/from Stand, Bed to chair/wheelchair/BSC Sit to Stand: Min assist   Step pivot transfers: Min assist, +2 physical assistance       General transfer comment: the patient insisted on using his own crutches despite education on how the rolling walker is significantly safer. +2 person assistance required for taking a few steps from bed to chair to complete transfers. cues for hand placement, crutch placement, and to improve eccentric control. patient now has a bariatric recliner chair in the room and he was agreeable to sit up for 1-2 hours.    Ambulation/Gait Ambulation/Gait assistance: Mod assist, +2 physical assistance Gait Distance (Feet): 1 Feet Assistive device: Crutches Gait Pattern/deviations: Decreased stride length, Trunk flexed, Wide base of support Gait velocity: decreased     General Gait Details: he can advance LLE but is unable to weight bear on LLE for advancement of RLE. standing activity tolerance limited by fatigue and generalized wekaness. unable to safely progress ambulation distance further at this time. also the  patient is likely  to have improved balance with the rolling walker which he is refusing to try at this time   Stairs             Wheelchair Mobility    Modified Rankin (Stroke Patients Only)       Balance           Standing balance support: Bilateral upper extremity supported Standing balance-Leahy Scale: Fair Standing balance comment: standing tolerance was limited to 30 seconds today                            Cognition Arousal/Alertness: Awake/alert Behavior During Therapy: WFL for tasks assessed/performed Overall Cognitive Status: Within Functional Limits for tasks assessed                                 General Comments: cooperative during session, but continues to have decreased insight with poor safety awareness        Exercises General Exercises - Lower Extremity Hip Flexion/Marching: AROM, Strengthening, Left, 10 reps, Standing (with UE supported on crutches)    General Comments        Pertinent Vitals/Pain Pain Assessment Pain Assessment: Faces Faces Pain Scale: Hurts a little bit Pain Location: L foot Pain Descriptors / Indicators: Pins and needles Pain Intervention(s): Limited activity within patient's tolerance, Monitored during session    Home Living                          Prior Function            PT Goals (current goals can now be found in the care plan section) Acute Rehab PT Goals Patient Stated Goal: to go home PT Goal Formulation: With patient Time For Goal Achievement: 04/09/22 Potential to Achieve Goals: Fair Progress towards PT goals: Progressing toward goals    Frequency    Min 2X/week      PT Plan Current plan remains appropriate    Co-evaluation              AM-PAC PT "6 Clicks" Mobility   Outcome Measure  Help needed turning from your back to your side while in a flat bed without using bedrails?: None Help needed moving from lying on your back to sitting on the  side of a flat bed without using bedrails?: A Little Help needed moving to and from a bed to a chair (including a wheelchair)?: A Little Help needed standing up from a chair using your arms (e.g., wheelchair or bedside chair)?: A Little Help needed to walk in hospital room?: Total Help needed climbing 3-5 steps with a railing? : Total 6 Click Score: 15    End of Session Equipment Utilized During Treatment: Oxygen Activity Tolerance: Patient tolerated treatment well;Patient limited by fatigue Patient left: in chair;with call bell/phone within reach;with chair alarm set (nursing student in the room) Nurse Communication: Mobility status PT Visit Diagnosis: Unsteadiness on feet (R26.81);Muscle weakness (generalized) (M62.81)     Time: 6144-3154 PT Time Calculation (min) (ACUTE ONLY): 30 min  Charges:  $Therapeutic Activity: 23-37 mins                    Minna Merritts, PT, MPT    Percell Locus 03/29/2022, 11:21 AM

## 2022-03-29 NOTE — Progress Notes (Signed)
[facility] [place] NAME: Bryan Mitchell    MR#:  [id number]  DATE OF BIRTH:  1961-04-12  SUBJECTIVE:  patient much alert and coherent. No family at bedside. Pt refuses SSI if sugars <150 per RN Appears motivated to work with therapy for PT patient is having difficulty using his left foot due to pain. Currently on prednisone.  On the bed pan!  VITALS:  Blood pressure (!) 143/70, pulse 100, temperature 98.5 F (36.9 C), resp. rate 18, height 5\' 8"  (1.727 m), weight 132.8 kg, SpO2 91 %.  PHYSICAL EXAMINATION:   GENERAL:  61 y.o.-year-old patient lying in the bed with no acute distress. Morbid obesity LUNGS: Normal breath sounds bilaterally, no wheezing, rales, rhonchi.  CARDIOVASCULAR: S1, S2 normal. No murmurs, rubs, or gallops.  ABDOMEN: Soft, nontender, nondistended. Bowel sounds present. Chronic Foley EXTREMITIES: No  edema b/l.    NEUROLOGIC: nonfocal  patient is alert and awake SKIN: No obvious rash, lesion, or ulcer.   LABORATORY PANEL:  CBC Recent Labs  Lab 03/27/22 0519  WBC 4.4  HGB 16.3  HCT 54.9*  PLT 195     Chemistries  Recent Labs  Lab 03/27/22 0519  NA 141  K 4.1  CL 101  CO2 36*  GLUCOSE 138*  BUN 20  CREATININE 0.49*  CALCIUM 9.5  MG 2.0     Assessment and Plan   Bryan Mitchell is a 61 y.o. M presenting to [place] ED on 02/23/22 from home in a wheelchair with his sister for evaluation of worsening fatigue, poor appetite and SOB over the past 2 weeks. Per ED documentation, patient also complained of worsening bilateral leg swelling and pain.    Acute on chronic hypoxic and hypercapnia respiratory failure COPD exacerbation congestive heart failure diastolic rhinovirus infection-- resolved tobacco abuse -- patient now extubated on high flow nasal cannula oxygen, currently on 4L -- completed IV steroids, IV antibiotic -- diuresis as BP and renal function permits -- will try to wean patient down  from oxygen. No respiratory distress.   Acute on chronic diastolic heart failure pulmonary hypertension -- Echo shows EF 60 to [id number], grade 2 diastolic dysfunction, pulmonary hypertension -- Lasix held due to hypernatremia   Polycythemia secondary to chronic hypoxia --stable   Type II diabetes with morbid obesity-- New dx -- hemoglobin A1c 6.5.  was not on insulin PTA. --cont glargine 20u nightly--A1c 6.5% --sugars stable. Will change to Metformin 850 mg bid and see how he does.  --SSI   Generalized weakness, morbid obesity, wheelchair-bound -- PT/OT-- patient working with PT OT however requires max assist   Urinary retention with extended period of foley use given acuity of illness --foley removed 03/18/22, failed voiding trial needing I/O cath --re-inserted Foley on 9/13 --cont flomax   Intermittent confusion with agitation/?underlying cognitive decline vs hospital delirium (No mention of such in [place]) --was on seroquel nightly (new)--will hold off since mentation has been stable. Resume if needed   morbid obesity, BMI 48 on presentation   suspected sleep apnea --BiPAP nightly   Left ankle/foot pain  --pt complains of pain in both feet, and tender to palpation on the bottom of both feet. --give short course of steroid --left ankle xray today     DVT prophylaxis: [id number] Code Status: Full code  Family Communication: none today Level of care: Med-Surg Dispo:   The patient is from: home Anticipated d/c is to: undetermined Anticipated d/c date is: undetermined  No  bed offers. Cont rehab inhouse    TOTAL TIME TAKING CARE OF THIS PATIENT: 25 minutes.  >50% time spent on counselling and coordination of care  Note: This dictation was prepared with Dragon dictation along with smaller phrase technology. Any transcriptional errors that result from this process are unintentional.  Fritzi Mandes M.D    [facility]   CC: Primary care physician; Pcp, No

## 2022-03-29 NOTE — Progress Notes (Addendum)
Patient sats 84% on 2L [place] while asleep and crackles noted in right low lung.  Raised HOB and sats increased to 90% on 2L [place].  MD notifed and orders received.

## 2022-03-29 NOTE — TOC Progression Note (Signed)
Transition of Care [facility]) - Progression Note    Patient Details  Name: Bryan Mitchell MRN: [id number] Date of Birth: [birth year]  Transition of Care [facility]) CM/SW [place], RN Phone Number: 03/29/2022, 9:25 AM  Clinical Narrative:    Resent out the bedsearch, no bed offers yet   Expected Discharge Plan: [place] Barriers to Discharge: Continued Medical Work up  Expected Discharge Plan and Services Expected Discharge Plan: [place]   Discharge Planning Services: CM Consult Post Acute Care Choice: [place] Living arrangements for the past 2 months: Single Family Home                                       Social Determinants of Health (SDOH) Interventions    Readmission Risk Interventions     No data to display

## 2022-03-30 LAB — GLUCOSE, CAPILLARY
Glucose-Capillary: 136 mg/dL — ABNORMAL HIGH (ref 70–99)
Glucose-Capillary: 171 mg/dL — ABNORMAL HIGH (ref 70–99)
Glucose-Capillary: 170 mg/dL — ABNORMAL HIGH (ref 70–99)
Glucose-Capillary: 141 mg/dL — ABNORMAL HIGH (ref 70–99)

## 2022-03-30 MED ORDER — DICLOFENAC SODIUM 1 % EX GEL
2.0000 g | Freq: Three times a day (TID) | CUTANEOUS | Status: DC | PRN
  Administered 2022-03-30 – 2022-04-06 (×6): 2 g via TOPICAL
  Filled 2022-03-30: qty 100

## 2022-03-30 MED ORDER — GABAPENTIN 100 MG PO CAPS
200.0000 mg | ORAL_CAPSULE | Freq: Every day | ORAL | Status: DC
  Administered 2022-03-30 – 2022-04-06 (×8): 200 mg via ORAL
  Filled 2022-03-30 (×8): qty 2

## 2022-03-30 NOTE — Progress Notes (Signed)
[facility] [place] NAME: Bryan Mitchell    MR#:  [id number]  DATE OF BIRTH:  [birth year]  SUBJECTIVE:  patient much alert and coherent. No family at bedside. Celebrated his birthday yday  VITALS:  Blood pressure 120/81, pulse 94, temperature 98.1 F (36.7 C), resp. rate 17, height 5\' 8"  (1.727 m), weight 132.6 kg, SpO2 96 %.  PHYSICAL EXAMINATION:   GENERAL:  61 y.o.-year-old patient lying in the bed with no acute distress. Morbid obesity LUNGS: Normal breath sounds bilaterally, no wheezing, rales, rhonchi.  CARDIOVASCULAR: S1, S2 normal. No murmurs, rubs, or gallops.  ABDOMEN: Soft, nontender, nondistended. Bowel sounds present. Chronic Foley EXTREMITIES: No  edema b/l.    NEUROLOGIC: nonfocal  patient is alert and awake SKIN: No obvious rash, lesion, or ulcer.   LABORATORY PANEL:  CBC Recent Labs  Lab 03/27/22 0519  WBC 4.4  HGB 16.3  HCT 54.9*  PLT 195     Chemistries  Recent Labs  Lab 03/27/22 0519  NA 141  K 4.1  CL 101  CO2 36*  GLUCOSE 138*  BUN 20  CREATININE 0.49*  CALCIUM 9.5  MG 2.0     Assessment and Plan   Bryan Mitchell is a 61 y.o. M presenting to [facility] ED on 02/23/22 from home in a wheelchair with his sister for evaluation of worsening fatigue, poor appetite and SOB over the past 2 weeks. Per ED documentation, patient also complained of worsening bilateral leg swelling and pain.    Acute on chronic hypoxic and hypercapnia respiratory failure COPD exacerbation congestive heart failure diastolic rhinovirus infection-- resolved tobacco abuse -- patient now extubated on high flow nasal cannula oxygen, currently on 4L -- completed IV steroids, IV antibiotic -- diuresis as BP and renal function permits -- will try to wean patient down from oxygen. No respiratory distress.   Acute on chronic diastolic heart failure pulmonary hypertension -- Echo shows EF 60 to 63%, grade 2 diastolic dysfunction,  pulmonary hypertension -- Lasix held due to hypernatremia   Polycythemia secondary to chronic hypoxia --stable   Type II diabetes with morbid obesity-- New dx -- hemoglobin A1c 6.5.  was not on insulin PTA. --cont glargine 20u nightly--A1c 6.5% --sugars stable. Will change to Metformin 850 mg bid and see how he does.  --SSI   Generalized weakness, morbid obesity, wheelchair-bound -- PT/OT-- patient working with PT OT however requires max assist   Urinary retention with extended period of foley use given acuity of illness --foley removed 03/18/22, failed voiding trial needing I/O cath --re-inserted Foley on 9/13 --cont flomax   Intermittent confusion with agitation/?underlying cognitive decline vs hospital delirium (No mention of such in [place]) --was on seroquel nightly (new)--will hold off since mentation has been stable. Resume if needed   morbid obesity, BMI 48 on presentation   suspected sleep apnea --BiPAP nightly   Left ankle/foot pain  --pt complains of pain in both feet, and tender to palpation on the bottom of both feet. --give short course of steroid --left ankle xray shows changes of OA --diclofenac gel --Trail of Gabapentin given c/o numbness     DVT prophylaxis: [id number] Code Status: Full code  Family Communication: none today Level of care: Med-Surg Dispo:   The patient is from: home Anticipated d/c is to: undetermined Anticipated d/c date is: undetermined  No bed offers. Cont rehab inhouse    TOTAL TIME TAKING CARE OF THIS PATIENT: 25 minutes.  >50% time spent  on counselling and coordination of care  Note: This dictation was prepared with Dragon dictation along with smaller phrase technology. Any transcriptional errors that result from this process are unintentional.  Fritzi Mandes M.D    [facility]   CC: Primary care physician; Pcp, No

## 2022-03-31 LAB — GLUCOSE, CAPILLARY
Glucose-Capillary: 143 mg/dL — ABNORMAL HIGH (ref 70–99)
Glucose-Capillary: 140 mg/dL — ABNORMAL HIGH (ref 70–99)
Glucose-Capillary: 156 mg/dL — ABNORMAL HIGH (ref 70–99)
Glucose-Capillary: 113 mg/dL — ABNORMAL HIGH (ref 70–99)

## 2022-03-31 LAB — TROPONIN I (HIGH SENSITIVITY)
Troponin I (High Sensitivity): 8 ng/L (ref <18)
Troponin I (High Sensitivity): 7 ng/L (ref <18)

## 2022-03-31 MED ORDER — ASPIRIN 81 MG PO TBEC
81.0000 mg | DELAYED_RELEASE_TABLET | Freq: Every day | ORAL | Status: DC
  Administered 2022-03-31 – 2022-04-12 (×13): 81 mg via ORAL
  Filled 2022-03-31 (×13): qty 1

## 2022-03-31 NOTE — Progress Notes (Signed)
[facility]  -  at [facility]   PATIENT NAME: Bryan Mitchell    MR#:  [id number]  DATE OF BIRTH:  Mar 22, 1961  SUBJECTIVE:  patient much alert and coherent. No family at bedside. C/p CP after he woke up, vitals ok  VITALS:  Blood pressure 117/76, pulse 91, temperature 98 F (36.7 C), temperature source Oral, resp. rate 19, height 5\' 8"  (1.727 m), weight 133.1 kg, SpO2 94 %.  PHYSICAL EXAMINATION:   GENERAL:  61 y.o.-year-old patient lying in the bed with no acute distress. Morbid obesity LUNGS: Normal breath sounds bilaterally, no wheezing, rales, rhonchi.  CARDIOVASCULAR: S1, S2 normal. No murmurs, rubs, or gallops.  ABDOMEN: Soft, nontender, nondistended. Bowel sounds present. Chronic Foley EXTREMITIES: No  edema b/l.    NEUROLOGIC: nonfocal  patient is alert and awake SKIN: No obvious rash, lesion, or ulcer.   LABORATORY PANEL:  CBC Recent Labs  Lab 03/27/22 0519  WBC 4.4  HGB 16.3  HCT 54.9*  PLT 195     Chemistries  Recent Labs  Lab 03/27/22 0519  NA 141  K 4.1  CL 101  CO2 36*  GLUCOSE 138*  BUN 20  CREATININE 0.49*  CALCIUM 9.5  MG 2.0     Assessment and Plan   Bryan Mitchell is a 61 y.o. M presenting to [facility] ED on 02/23/22 from home in a wheelchair with his sister for evaluation of worsening fatigue, poor appetite and SOB over the past 2 weeks. Per ED documentation, patient also complained of worsening bilateral leg swelling and pain.    Acute on chronic hypoxic and hypercapnia respiratory failure COPD exacerbation congestive heart failure diastolic--chronic rhinovirus infection-- resolved tobacco abuse -- patient now extubated on high flow nasal cannula oxygen, currently on 4L -- completed IV steroids, IV antibiotic -- diuresis as BP and renal function permits -- will try to wean patient down from oxygen. No respiratory distress.   Acute on chronic diastolic heart failure pulmonary hypertension -- Echo shows EF 60 to  65%, grade 2 diastolic dysfunction, pulmonary hypertension -- Lasix held due to hypernatremia   Polycythemia secondary to chronic hypoxia --stable   Chest pain, appears atypical --EKG no acute changes. Troponin x 2 neg  Type II diabetes with morbid obesity-- New dx -- hemoglobin A1c 6.5.  was not on insulin PTA. --cont glargine 20u nightly--A1c 6.5% --sugars stable. Will change to Metformin 850 mg bid and see how he does.  --SSI   Generalized weakness, morbid obesity, wheelchair-bound -- PT/OT-- patient working with PT OT however requires max assist   Urinary retention with extended period of foley use given acuity of illness --foley removed 03/18/22, failed voiding trial needing I/O cath --re-inserted Foley on 9/13 --cont flomax   Intermittent confusion with agitation/?underlying cognitive decline vs hospital delirium (No mention of such in PMH) --was on seroquel nightly (new)--will hold off since mentation has been stable. Resume if needed   morbid obesity, BMI 48 on presentation   suspected sleep apnea --BiPAP nightly   Left ankle/foot pain  --pt complains of pain in both feet, and tender to palpation on the bottom of both feet. --give short course of steroid --left ankle xray shows changes of OA --diclofenac gel --Trail of Gabapentin given c/o numbness     DVT prophylaxis: [id number] Code Status: Full code  Family Communication: none today Level of care: Med-Surg Dispo:   The patient is from: home Anticipated d/c is to: undetermined Anticipated d/c date is: undetermined  No bed  offers. Cont rehab inhouse    TOTAL TIME TAKING CARE OF THIS PATIENT: 25 minutes.  >50% time spent on counselling and coordination of care  Note: This dictation was prepared with Dragon dictation along with smaller phrase technology. Any transcriptional errors that result from this process are unintentional.  Fritzi Mandes M.D    [facility]   CC: Primary care physician;  Pcp, No

## 2022-03-31 NOTE — Progress Notes (Signed)
SLP Follow Up Note  Patient Details Name: Bryan Mitchell MRN: [id number] DOB: June 19, 1961   At this time, pt's cognitive communication abilities are functional for setting, are not preventing him from accessing therapies or comprehending his POC. In addition, his cognitive function is not preventing him from discharging at an independent level.   In discussing pt with his attending, further cognitive communication are not identified.   At this time, recommend any further cognitive therapy be addressed at next venue of care in possible preparation for independent discharge.   Cabrina Shiroma B. Rutherford Nail, M.S., CCC-SLP, Mining engineer Certified Brain Injury [place]  [place] Office [phone] Ascom [phone] Fax [phone]

## 2022-03-31 NOTE — TOC Progression Note (Signed)
Transition of Care [facility]) - Progression Note    Patient Details  Name: Bryan Mitchell MRN: [id number] Date of Birth: 07-06-1961  Transition of Care [facility]) CM/SW [place], [place] Phone Number: 03/31/2022, 12:06 PM  Clinical Narrative:     Still no bed offers.   Expected Discharge Plan: [place] Barriers to Discharge: Continued Medical Work up  Expected Discharge Plan and Services Expected Discharge Plan: [place]   Discharge Planning Services: CM Consult Post Acute Care Choice: [place] Living arrangements for the past 2 months: Single Family Home                                       Social Determinants of Health (SDOH) Interventions    Readmission Risk Interventions     No data to display

## 2022-04-01 LAB — GLUCOSE, CAPILLARY
Glucose-Capillary: 117 mg/dL — ABNORMAL HIGH (ref 70–99)
Glucose-Capillary: 193 mg/dL — ABNORMAL HIGH (ref 70–99)
Glucose-Capillary: 171 mg/dL — ABNORMAL HIGH (ref 70–99)
Glucose-Capillary: 116 mg/dL — ABNORMAL HIGH (ref 70–99)

## 2022-04-01 NOTE — Progress Notes (Signed)
Occupational Therapy Treatment Patient Details Name: Bryan Mitchell MRN: [id number] DOB: [birth year] Today's Date: 04/01/2022   History of present illness Patient is a 61 year old male with worsening acute on chronic hypoxic/hypercapnic respiratory failure requiring emergent intubation and mechanical ventilatory support. Suspected COPD exacerbation in the setting of HFpEF and possible pulmonary hypertension. Required intuatbion but is now extubated.   OT comments  Mr Cork was seen for OT treatment on this date. Upon arrival to room pt sleeping, awakes to voice and agreeable to tx. Pt requires CGA + RW sit<>stand and ~15 ft mobility. Fair static standing balance, requires BUE support. Pt pleasant and agreeable to using RW however cues for safety with mobility (improved from prior sessions). MOD I for bed mobility. Pt making good progress toward goals, will continue to follow POC. Discharge recommendation remains appropriate.     Recommendations for follow up therapy are one component of a multi-disciplinary discharge planning process, led by the attending physician.  Recommendations may be updated based on patient status, additional functional criteria and insurance authorization.    Follow Up Recommendations  Skilled nursing-short term rehab (<3 hours/day)    Assistance Recommended at Discharge Frequent or constant Supervision/Assistance  Patient can return home with the following  A lot of help with walking and/or transfers;A lot of help with bathing/dressing/bathroom;Help with stairs or ramp for entrance   Equipment Recommendations  BSC/3in1    Recommendations for Other Services      Precautions / Restrictions Precautions Precautions: Fall Restrictions Weight Bearing Restrictions: No       Mobility Bed Mobility Overal bed mobility: Modified Independent                  Transfers Overall transfer level: Needs assistance Equipment used: Rolling walker (2  wheels) Transfers: Sit to/from Stand Sit to Stand: Min guard           General transfer comment: CGA ~15 ft     Balance Overall balance assessment: Needs assistance Sitting-balance support: Feet supported Sitting balance-Leahy Scale: Good     Standing balance support: Bilateral upper extremity supported, During functional activity, Reliant on assistive device for balance, No upper extremity supported Standing balance-Leahy Scale: Fair                             ADL either performed or assessed with clinical judgement   ADL Overall ADL's : Needs assistance/impaired                                       General ADL Comments: CGA + RW for simulated BSC t/f     Cognition Arousal/Alertness: Awake/alert Behavior During Therapy: WFL for tasks assessed/performed Overall Cognitive Status: Within Functional Limits for tasks assessed                                 General Comments: pleasant, agreeable to use of RW, cues for safety                   Pertinent Vitals/ Pain       Pain Assessment Pain Assessment: No/denies pain   Frequency  Min 2X/week        Progress Toward Goals  OT Goals(current goals can now be found in the care plan section)  Progress towards  OT goals: Progressing toward goals  Acute Rehab OT Goals Patient Stated Goal: to go home OT Goal Formulation: With patient Time For Goal Achievement: 04/08/22 Potential to Achieve Goals: Fair ADL Goals Pt Will Perform Grooming: with min assist;standing Pt Will Perform Upper Body Dressing: with min assist;standing Pt Will Transfer to Toilet: stand pivot transfer;bedside commode;with min guard assist Pt/caregiver will Perform Home Exercise Program: Increased ROM;Increased strength;Both right and left upper extremity  Plan Discharge plan remains appropriate;Frequency remains appropriate    Co-evaluation                 AM-PAC OT "6 Clicks" Daily  Activity     Outcome Measure   Help from another person eating meals?: None Help from another person taking care of personal grooming?: A Little Help from another person toileting, which includes using toliet, bedpan, or urinal?: A Lot Help from another person bathing (including washing, rinsing, drying)?: A Little Help from another person to put on and taking off regular upper body clothing?: A Little Help from another person to put on and taking off regular lower body clothing?: A Lot 6 Click Score: 17    End of Session Equipment Utilized During Treatment: Rolling walker (2 wheels)  OT Visit Diagnosis: Other abnormalities of gait and mobility (R26.89);Muscle weakness (generalized) (M62.81)   Activity Tolerance Patient tolerated treatment well;Patient limited by pain   Patient Left in bed;with call bell/phone within reach;with bed alarm set   Nurse Communication Mobility status        Time: 0737-1062 OT Time Calculation (min): 11 min  Charges: OT General Charges $OT Visit: 1 Visit OT Treatments $Therapeutic Activity: 8-22 mins  Kathie Dike, M.S. OTR/L  04/01/22, 2:08 PM  ascom [phone]

## 2022-04-01 NOTE — Progress Notes (Signed)
No bed offers

## 2022-04-01 NOTE — Progress Notes (Signed)
Physical Therapy Treatment Patient Details Name: Bryan Mitchell MRN: [id number] DOB: 1961/03/06 Today's Date: 04/01/2022   History of Present Illness Patient is a 61 year old male with worsening acute on chronic hypoxic/hypercapnic respiratory failure requiring emergent intubation and mechanical ventilatory support. Suspected COPD exacerbation in the setting of HFpEF and possible pulmonary hypertension. Required intuatbion but is now extubated.   PT Comments    Patient is seated on the bed side commode on arrival to room. Transfer training continued with crutches and rolling walker. Patient is much safer with using the rolling walker with education provided on the importance of this. He is in agreement to continue mobility training with using the rolling walker now. Pre-gait activity performed with standing tolerance now of 2 minutes which is an improvement from previous sessions. Patient was able to take several steps with rolling walker with activity tolerance limited by fatigue. Recommend to continue PT to maximize independence and facilitate return to prior level of function.    Recommendations for follow up therapy are one component of a multi-disciplinary discharge planning process, led by the attending physician.  Recommendations may be updated based on patient status, additional functional criteria and insurance authorization.  Follow Up Recommendations  Skilled nursing-short term rehab (<3 hours/day) Can patient physically be transported by private vehicle: No   Assistance Recommended at Discharge Frequent or constant Supervision/Assistance  Patient can return home with the following Help with stairs or ramp for entrance;Assist for transportation;Assistance with feeding;Direct supervision/assist for medications management;Direct supervision/assist for financial management;Assistance with cooking/housework;A lot of help with walking and/or transfers;A lot of help with  bathing/dressing/bathroom   Equipment Recommendations   (to be determined)    Recommendations for Other Services       Precautions / Restrictions Precautions Precautions: Fall Restrictions Weight Bearing Restrictions: No     Mobility  Bed Mobility Overal bed mobility: Needs Assistance Bed Mobility: Sit to Supine       Sit to supine: Supervision (HOB flat)        Transfers Overall transfer level: Needs assistance Equipment used: Crutches, Rolling walker (2 wheels) Transfers: Sit to/from Stand Sit to Stand: Supervision, Min guard   Step pivot transfers: Min guard       General transfer comment: transfer training continued with multiple standing bouts performed. patient performed stand step transfer from bed side commode to bed. sit to stand performed from bed once with crutches and once with the rolling walker. safety improved with use of rolling walker and patient is agreeable to continue using the rolling walker instead of the crutches with standing/ambulation efforts    Ambulation/Gait Ambulation/Gait assistance: Min guard Gait Distance (Feet): 2 Feet Assistive device: Rolling walker (2 wheels) Gait Pattern/deviations: Wide base of support Gait velocity: decreased   Pre-gait activities: standing tolerance of 2 minutes with UE supported on crutches. weight shifting through BLE with standing in preparation for progressive ambulation with no knee buckling noted General Gait Details: patient was able to take several small steps with bilateral LE using rolling walker. verbal cues for technique. activity tolerance limited by fatigue. encouraged patient to attempt further distance ambulation next session with rolling walker and chair follow for safety   Stairs             Wheelchair Mobility    Modified Rankin (Stroke Patients Only)       Balance   Sitting-balance support: Feet supported Sitting balance-Leahy Scale: Good     Standing balance support:  Bilateral upper extremity supported, During functional  activity, Reliant on assistive device for balance, No upper extremity supported Standing balance-Leahy Scale: Fair Standing balance comment: patient has posterior loss of balance without UE support while standing and just sits back on the bed- poor balance. with UE supported on rolling walker, patient with improved standing balance to fair with no external support required                            Cognition Arousal/Alertness: Awake/alert Behavior During Therapy: WFL for tasks assessed/performed Overall Cognitive Status: Within Functional Limits for tasks assessed                                 General Comments: patient is cooperative during session. mild decreased safety awareness, requiring safety cues with mobility. overall safety awareness has improved significantly        Exercises General Exercises - Lower Extremity Ankle Circles/Pumps: AROM, Strengthening, Both, 10 reps, Seated    General Comments        Pertinent Vitals/Pain Pain Assessment Pain Assessment: Faces Faces Pain Scale: Hurts a little bit Pain Location: L foot Pain Descriptors / Indicators: Pins and needles Pain Intervention(s): Limited activity within patient's tolerance, Monitored during session    Home Living                          Prior Function            PT Goals (current goals can now be found in the care plan section) Acute Rehab PT Goals Patient Stated Goal: to go home PT Goal Formulation: With patient Time For Goal Achievement: 04/09/22 Potential to Achieve Goals: Fair Progress towards PT goals: Progressing toward goals    Frequency    Min 2X/week      PT Plan Current plan remains appropriate    Co-evaluation              AM-PAC PT "6 Clicks" Mobility   Outcome Measure  Help needed turning from your back to your side while in a flat bed without using bedrails?: None Help needed  moving from lying on your back to sitting on the side of a flat bed without using bedrails?: A Little Help needed moving to and from a bed to a chair (including a wheelchair)?: A Little Help needed standing up from a chair using your arms (e.g., wheelchair or bedside chair)?: A Little Help needed to walk in hospital room?: Total Help needed climbing 3-5 steps with a railing? : Total 6 Click Score: 15    End of Session Equipment Utilized During Treatment: Oxygen Activity Tolerance: Patient tolerated treatment well Patient left: in bed;with call bell/phone within reach;with bed alarm set Nurse Communication: Mobility status PT Visit Diagnosis: Unsteadiness on feet (R26.81);Muscle weakness (generalized) (M62.81)     Time: 7510-2585 PT Time Calculation (min) (ACUTE ONLY): 41 min  Charges:  $Therapeutic Activity: 38-52 mins                     Donna Bernard, PT, MPT    [facility] 04/01/2022, 12:24 PM

## 2022-04-01 NOTE — Plan of Care (Signed)
  Problem: Education: Goal: Knowledge of General Education information will improve Description: Including pain rating scale, medication(s)/side effects and non-pharmacologic comfort measures Outcome: Progressing   Problem: Activity: Goal: Risk for activity intolerance will decrease Outcome: Progressing   Problem: Nutrition: Goal: Adequate nutrition will be maintained Outcome: Progressing   Problem: Coping: Goal: Level of anxiety will decrease Outcome: Progressing   Problem: Pain Managment: Goal: General experience of comfort will improve Outcome: Progressing   Problem: Safety: Goal: Ability to remain free from injury will improve Outcome: Progressing   Problem: Skin Integrity: Goal: Risk for impaired skin integrity will decrease Outcome: Progressing   Problem: Skin Integrity: Goal: Risk for impaired skin integrity will decrease Outcome: Progressing

## 2022-04-01 NOTE — Progress Notes (Signed)
[facility] [place] at [place] NAME: Bryan Mitchell    MR#:  [id number]  DATE OF BIRTH:  01-26-1961  SUBJECTIVE:  work with physical therapy. Patient reports steps with rolling walker. He is been trying to get to bedside commode. Denies any chest pain. VITALS:  Blood pressure 117/72, pulse 85, temperature 97.8 F (36.6 C), temperature source Oral, resp. rate 18, height 5\' 8"  (1.727 m), weight 133.1 kg, SpO2 94 %.  PHYSICAL EXAMINATION:   GENERAL:  61 y.o.-year-old patient lying in the bed with no acute distress. Morbid obesity LUNGS: Normal breath sounds bilaterally, no wheezing, rales, rhonchi.  CARDIOVASCULAR: S1, S2 normal. No murmurs, rubs, or gallops.  ABDOMEN: Soft, nontender, nondistended. Bowel sounds present. Chronic Foley EXTREMITIES: No  edema b/l.    NEUROLOGIC: nonfocal  patient is alert and awake SKIN: No obvious rash, lesion, or ulcer.   LABORATORY PANEL:  CBC Recent Labs  Lab 03/27/22 0519  WBC 4.4  HGB 16.3  HCT 54.9*  PLT 195     Chemistries  Recent Labs  Lab 03/27/22 0519  NA 141  K 4.1  CL 101  CO2 36*  GLUCOSE 138*  BUN 20  CREATININE 0.49*  CALCIUM 9.5  MG 2.0     Assessment and Plan   Bryan Mitchell is a 61 y.o. M presenting to [facility] ED on 02/23/22 from home in a wheelchair with his sister for evaluation of worsening fatigue, poor appetite and SOB over the past 2 weeks. Per ED documentation, patient also complained of worsening bilateral leg swelling and pain.    Acute on chronic hypoxic and hypercapnia respiratory failure COPD exacerbation congestive heart failure diastolic--chronic rhinovirus infection-- resolved tobacco abuse -- patient now extubated on high flow nasal cannula oxygen, currently on 4L -- completed IV steroids, IV antibiotic -- diuresis as BP and renal function permits -- will try to wean patient down from oxygen. No respiratory distress.   Acute on chronic diastolic heart  failure pulmonary hypertension -- Echo shows EF 60 to 76%, grade 2 diastolic dysfunction, pulmonary hypertension -- Lasix held due to hypernatremia   Polycythemia secondary to chronic hypoxia --stable   Chest pain, appears atypical --EKG no acute changes. Troponin x 2 neg  Type II diabetes with morbid obesity-- New dx -- hemoglobin A1c 6.5.  was not on insulin PTA. --cont glargine 20u nightly--A1c 6.5% --sugars stable. Will change to Metformin 850 mg bid and see how he does.  --SSI   Generalized weakness, morbid obesity, wheelchair-bound -- PT/OT-- patient working with PT OT however requires max assist   Urinary retention with extended period of foley use given acuity of illness --foley removed 03/18/22, failed voiding trial needing I/O cath --re-inserted Foley on 9/13 --cont flomax   Intermittent confusion with agitation/?underlying cognitive decline vs hospital delirium (No mention of such in [place]) --was on seroquel nightly (new)--will hold off since mentation has been stable. Resume if needed   morbid obesity, BMI 48 on presentation   suspected sleep apnea --BiPAP nightly   Left ankle/foot pain  --pt complains of pain in both feet, and tender to palpation on the bottom of both feet. --completed short course of steroid --left ankle xray shows changes of OA --diclofenac gel --Trial of Gabapentin given c/o numbness     DVT prophylaxis: [id number] Code Status: Full code  Family Communication: none today Level of care: Med-Surg Dispo:   The patient is from: home Anticipated d/c is to: undetermined Anticipated d/c date  is: undetermined  No bed offers. Cont rehab inhouse EDD: unknown    TOTAL TIME TAKING CARE OF THIS PATIENT: 25 minutes.  >50% time spent on counselling and coordination of care  Note: This dictation was prepared with Dragon dictation along with smaller phrase technology. Any transcriptional errors that result from this process are  unintentional.  Fritzi Mandes M.D    [facility]   CC: Primary care physician; Pcp, No

## 2022-04-02 LAB — GLUCOSE, CAPILLARY
Glucose-Capillary: 131 mg/dL — ABNORMAL HIGH (ref 70–99)
Glucose-Capillary: 144 mg/dL — ABNORMAL HIGH (ref 70–99)
Glucose-Capillary: 136 mg/dL — ABNORMAL HIGH (ref 70–99)
Glucose-Capillary: 122 mg/dL — ABNORMAL HIGH (ref 70–99)

## 2022-04-02 NOTE — Progress Notes (Signed)
[facility] NAME: Bryan Mitchell    MR#:  [id number]  DATE OF BIRTH:  [birth year]  SUBJECTIVE:  Works with physical therapy. Patient reports took steps with rolling walker yday. He is been trying to get to bedside commode. Denies any chest pain. VITALS:  Blood pressure 128/82, pulse 87, temperature 98.5 F (36.9 C), temperature source Oral, resp. rate 19, height 5\' 8"  (1.727 m), weight 135.5 kg, SpO2 93 %.  PHYSICAL EXAMINATION:   GENERAL:  61 y.o.-year-old patient lying in the bed with no acute distress. Morbid obesity LUNGS: Normal breath sounds bilaterally, no wheezing, rales, rhonchi.  CARDIOVASCULAR: S1, S2 normal. No murmurs, rubs, or gallops.  ABDOMEN: Soft, nontender, nondistended. Bowel sounds present. Chronic Foley EXTREMITIES: No  edema b/l.    NEUROLOGIC: nonfocal  patient is alert and awake SKIN: No obvious rash, lesion, or ulcer.   LABORATORY PANEL:  CBC Recent Labs  Lab 03/27/22 0519  WBC 4.4  HGB 16.3  HCT 54.9*  PLT 195     Chemistries  Recent Labs  Lab 03/27/22 0519  NA 141  K 4.1  CL 101  CO2 36*  GLUCOSE 138*  BUN 20  CREATININE 0.49*  CALCIUM 9.5  MG 2.0     Assessment and Plan   Bryan Mitchell is a 61 y.o. M presenting to [facility] ED on 02/23/22 from home in a wheelchair with his sister for evaluation of worsening fatigue, poor appetite and SOB over the past 2 weeks. Per ED documentation, patient also complained of worsening bilateral leg swelling and pain.    Acute on chronic hypoxic and hypercapnia respiratory failure COPD exacerbation congestive heart failure diastolic--chronic rhinovirus infection-- resolved tobacco abuse -- patient now extubated on high flow nasal cannula oxygen, currently on 4L -- will try to wean patient down from oxygen. No respiratory distress.   Acute on chronic diastolic heart failure pulmonary hypertension -- Echo shows EF 60 to 24%, grade 2 diastolic  dysfunction, pulmonary hypertension -- Lasix held due to hypernatremia   Polycythemia secondary to chronic hypoxia --stable   Type II diabetes with morbid obesity-- New dx -- hemoglobin A1c 6.5.  was not on insulin PTA. --cont glargine 20u nightly--A1c 6.5% --sugars stable. Will change to Metformin 850 mg bid and see how he does.  --SSI    Urinary retention with extended period of foley use given acuity of illness --foley removed 03/18/22, failed voiding trial needing I/O cath --re-inserted Foley on 9/13 --cont flomax   Intermittent confusion with agitation/?underlying cognitive decline vs hospital delirium (No mention of such in [place]) --was on seroquel nightly (new)--will hold off since mentation has been stable. Resume if needed   morbid obesity, BMI 48 on presentation   suspected sleep apnea --BiPAP nightly   Left ankle/foot pain  --pt complains of pain in both feet, and tender to palpation on the bottom of both feet. --completed short course of steroid --left ankle xray shows changes of OA --diclofenac gel --Trial of Gabapentin given c/o numbness     DVT prophylaxis: MW:NUUVOZD Code Status: Full code  Family Communication: none today Level of care: Med-Surg Dispo:   The patient is from: home Anticipated d/c is to: undetermined Anticipated d/c date is: undetermined  No bed offers. Cont rehab inhouse EDD: unknown    TOTAL TIME TAKING CARE OF THIS PATIENT: 25 minutes.  >50% time spent on counselling and coordination of care  Note: This dictation was prepared with Dragon dictation  along with smaller phrase technology. Any transcriptional errors that result from this process are unintentional.  Fritzi Mandes M.D    [facility]   CC: Primary care physician; Pcp, No

## 2022-04-03 LAB — GLUCOSE, CAPILLARY
Glucose-Capillary: 123 mg/dL — ABNORMAL HIGH (ref 70–99)
Glucose-Capillary: 147 mg/dL — ABNORMAL HIGH (ref 70–99)
Glucose-Capillary: 164 mg/dL — ABNORMAL HIGH (ref 70–99)
Glucose-Capillary: 144 mg/dL — ABNORMAL HIGH (ref 70–99)

## 2022-04-03 NOTE — Progress Notes (Signed)
PROGRESS NOTE    Arvel Alessio  [id number] DOB: 07/20/1960 DOA: 02/23/2022  PCP: Pcp, No   Brief Narrative:  This 61 yrs old Male presenting to [facility] ED on 02/23/22 from home in a wheelchair with his sister for evaluation of worsening fatigue, poor appetite and SOB over the past 2 weeks. Per ED documentation, Patient also complained of worsening bilateral leg swelling and pain.  Patient was admitted for acute on chronic hypoxic and hypercapnic respiratory failure likely secondary to COPD exacerbation and rhinovirus infection which is now resolved.  PT and OT recommended SNF awaiting placement.  Assessment & Plan:   Principal Problem:   Acute hypoxemic respiratory failure (HCC) Active Problems:   Tobacco abuse   Morbid obesity (HCC)   COPD exacerbation (HCC)   Type 2 diabetes mellitus with complication, without long-term current use of insulin (HCC)   Acute respiratory failure (HCC)   Pressure injury of skin   Toxic metabolic encephalopathy   Anisocoria  Acute on chronic hypoxic and hypercapnic respiratory failure: Likely multifactorial due to COPD exacerbation, Acute on Chronic diastolic heart failure, rhinovirus infection. Rhinovirus infection > resolved.  Patient extubated and on high flow nasal cannula oxygen, currently on 4L. Wean from supplemental oxygen as tolerated.  No respiratory distress.   Acute on chronic diastolic heart failure: Pulmonary hypertension Echo shows LVEF 60 to [id number], grade 2 diastolic dysfunction, pulmonary hypertension Lasix was held due to hypernatremia.  Resume Lasix 40 mg daily. Appears euvolemic on exam.   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   Type 2 diabetes with morbid obesity > New diagnosis Hb A1c 6.5.  was not on insulin PTA. Continue Metformin 850 mg bid . Continue regular insulin sliding scale.    Urinary retention: Foley removed on 03/18/22, failed voiding trial needing I/O cath re-inserted Foley on 9/13 Continue  Flomax.  Consider voiding trial.   Intermittent confusion: Patient continues to have intermittent confusion with agitation likely secondary to hospital delirium. Seroquel is kept on hold until mentation is stable. Patient seems improved.   Morbid obesity BMI 48: Diet and exercise discussed in detail.   Obstructive sleep apnea: Continue BiPAP at night. Needs outpatient sleep studies.   Bilateral leg and foot pain: He complains of pain in both feet, and tender to palpation on the bottom of both feet. He completed short course of steroid Left ankle xray shows changes of OA Continue diclofenac gel Trial of Gabapentin given c/o numbness.    DVT prophylaxis: Arixtra Code Status: Full code Family Communication: No family at bedside Disposition Plan:    Status is: Inpatient Remains inpatient appropriate because: Admitted for acute on chronic hypoxic and hypercapnic respiratory failure secondary to acute on chronic CHF and COPD exacerbation.  Now medically stable awaiting SNF placement.  No bed offers yet.   Consultants:  None  Procedures: None Antimicrobials:  Anti-infectives (From admission, onward)    Start     Dose/Rate Route Frequency Ordered Stop   02/23/22 2300  cefTRIAXone (ROCEPHIN) 1 g in sodium chloride 0.9 % 100 mL IVPB        1 g 200 mL/hr over 30 Minutes Intravenous Every 24 hours 02/23/22 2241 02/27/22 2255   02/23/22 1915  doxycycline (VIBRAMYCIN) 100 mg in sodium chloride 0.9 % 250 mL IVPB        100 mg 125 mL/hr over 120 Minutes Intravenous Every 12 hours 02/23/22 1902 02/27/22 2205      Subjective: Patient was seen and examined at bedside.  Overnight  events noted.   Patient reports having severe pain in both legs.  He has participated in physical therapy.  Objective: Vitals:   04/02/22 1935 04/03/22 0335 04/03/22 0422 04/03/22 0800  BP:   133/74 (!) 150/88  Pulse:   100 (!) 102  Resp: 18  18 16   Temp:   97.7 F (36.5 C) 97.8 F (36.6 C)  TempSrc:    Oral Oral  SpO2:   93% 98%  Weight:  (!) 136.1 kg    Height:        Intake/Output Summary (Last 24 hours) at 04/03/2022 1154 Last data filed at 04/03/2022 1032 Gross per 24 hour  Intake 240 ml  Output 2000 ml  Net -1760 ml   Filed Weights   03/31/22 0556 04/02/22 0405 04/03/22 0335  Weight: 133.1 kg 135.5 kg (!) 136.1 kg    Examination:  General exam: Appears comfortable, not in any acute distress. Respiratory system: CTA bilaterally, no wheezing, no crackles, normal respiratory effort. Cardiovascular system: S1 & S2 heard, regular rate and rhythm, no murmur. Gastrointestinal system: Abdomen is soft, nontender, nondistended, BS +. Central nervous system: Alert and oriented x3. No focal neurological deficits. Extremities: No edema, no cyanosis, no clubbing. Skin: No rashes, lesions or ulcers Psychiatry: Judgement and insight appear normal. Mood & affect appropriate.    Data Reviewed: I have personally reviewed following labs and imaging studies  CBC: No results for input(s): "WBC", "NEUTROABS", "HGB", "HCT", "MCV", "PLT" in the last 168 hours. Basic Metabolic Panel: No results for input(s): "NA", "K", "CL", "CO2", "GLUCOSE", "BUN", "CREATININE", "CALCIUM", "MG", "PHOS" in the last 168 hours. GFR: Estimated Creatinine Clearance: 132.6 mL/min (A) (by C-G formula based on SCr of 0.49 mg/dL (L)). Liver Function Tests: No results for input(s): "AST", "ALT", "ALKPHOS", "BILITOT", "PROT", "ALBUMIN" in the last 168 hours. No results for input(s): "LIPASE", "AMYLASE" in the last 168 hours. No results for input(s): "AMMONIA" in the last 168 hours. Coagulation Profile: No results for input(s): "INR", "PROTIME" in the last 168 hours. Cardiac Enzymes: No results for input(s): "CKTOTAL", "CKMB", "CKMBINDEX", "TROPONINI" in the last 168 hours. BNP (last 3 results) No results for input(s): "PROBNP" in the last 8760 hours. HbA1C: No results for input(s): "HGBA1C" in the last 72  hours. CBG: Recent Labs  Lab 04/02/22 0739 04/02/22 1123 04/02/22 1559 04/02/22 1943 04/03/22 0838  GLUCAP 131* 144* 136* 122* 123*   Lipid Profile: No results for input(s): "CHOL", "HDL", "LDLCALC", "TRIG", "CHOLHDL", "LDLDIRECT" in the last 72 hours. Thyroid Function Tests: No results for input(s): "TSH", "T4TOTAL", "FREET4", "T3FREE", "THYROIDAB" in the last 72 hours. Anemia Panel: No results for input(s): "VITAMINB12", "FOLATE", "FERRITIN", "TIBC", "IRON", "RETICCTPCT" in the last 72 hours. Sepsis Labs: No results for input(s): "PROCALCITON", "LATICACIDVEN" in the last 168 hours.  No results found for this or any previous visit (from the past 240 hour(s)).   Radiology Studies: No results found.  Scheduled Meds:  aspirin EC  81 mg Oral Daily   Chlorhexidine Gluconate Cloth  6 each Topical Daily   docusate sodium  100 mg Oral BID   feeding supplement  237 mL Oral TID BM   fondaparinux (ARIXTRA) injection  2.5 mg Subcutaneous Q24H   gabapentin  200 mg Oral Daily   insulin aspart  0-5 Units Subcutaneous QHS   insulin aspart  0-9 Units Subcutaneous TID WC   metFORMIN  850 mg Oral BID WC   metoprolol tartrate  12.5 mg Oral BID   multivitamin with minerals  1  tablet Oral Daily   polyethylene glycol  17 g Oral Daily   tamsulosin  0.4 mg Oral Daily   Continuous Infusions:   LOS: 39 days    Time spent: 50 mins    Cherokee Clowers, MD [facility]   If 7PM-7AM, please contact night-coverage

## 2022-04-03 NOTE — Progress Notes (Signed)
Occupational Therapy Treatment Patient Details Name: Bryan Mitchell MRN: [id number] DOB: 15-Jul-1960 Today's Date: 04/03/2022   History of present illness Patient is a 61 year old male with worsening acute on chronic hypoxic/hypercapnic respiratory failure requiring emergent intubation and mechanical ventilatory support. Suspected COPD exacerbation in the setting of HFpEF and possible pulmonary hypertension. Required intuatbion but is now extubated.   OT comments  Bryan Mitchell was seen for OT treatment on this date. Upon arrival to room pt reclined in bed, agreeable to tx. Pt requires MAX A don/doff B socks at bed level, states cannot reach feet and was largely barefoot PTA. MOD I hair brushing in sitting. Attempted standing however pt states pain in L foot, defers. Discussed importance of mobility. Pt making progress toward goals, limited by pain, will continue to follow POC. Discharge recommendation remains appropriate.     Recommendations for follow up therapy are one component of a multi-disciplinary discharge planning process, led by the attending physician.  Recommendations may be updated based on patient status, additional functional criteria and insurance authorization.    Follow Up Recommendations  Skilled nursing-short term rehab (<3 hours/day)    Assistance Recommended at Discharge Frequent or constant Supervision/Assistance  Patient can return home with the following  A lot of help with walking and/or transfers;A lot of help with bathing/dressing/bathroom;Help with stairs or ramp for entrance   Equipment Recommendations  BSC/3in1    Recommendations for Other Services      Precautions / Restrictions Precautions Precautions: Fall Restrictions Weight Bearing Restrictions: No       Mobility Bed Mobility Overal bed mobility: Modified Independent                  Transfers                   General transfer comment: pt deferred citing pain     Balance  Overall balance assessment: Needs assistance Sitting-balance support: Feet supported Sitting balance-Leahy Scale: Good                                     ADL either performed or assessed with clinical judgement   ADL Overall ADL's : Needs assistance/impaired                                       General ADL Comments: MAX A don/doff B socks at bed level, states cannot reach feet and was largely barefoot PTA. MOD I hair brushing in sitting      Cognition Arousal/Alertness: Awake/alert Behavior During Therapy: WFL for tasks assessed/performed, Agitated Overall Cognitive Status: Within Functional Limits for tasks assessed                                 General Comments: continues to be limited by pain              General Comments SpO2 91% on RA    Pertinent Vitals/ Pain       Pain Assessment Pain Assessment: Faces Faces Pain Scale: Hurts even more Pain Location: L ankle and toes Pain Descriptors / Indicators: Aching Pain Intervention(s): Limited activity within patient's tolerance, Repositioned   Frequency  Min 2X/week        Progress Toward Goals  OT Goals(current goals  can now be found in the care plan section)  Progress towards OT goals: Progressing toward goals  Acute Rehab OT Goals Patient Stated Goal: to go home OT Goal Formulation: With patient Time For Goal Achievement: 04/08/22 Potential to Achieve Goals: Fair ADL Goals Pt Will Perform Grooming: with min assist;standing Pt Will Perform Upper Body Dressing: with min assist;standing Pt Will Transfer to Toilet: stand pivot transfer;bedside commode;with min guard assist Pt/caregiver will Perform Home Exercise Program: Increased ROM;Increased strength;Both right and left upper extremity  Plan Discharge plan remains appropriate;Frequency remains appropriate    Co-evaluation                 AM-PAC OT "6 Clicks" Daily Activity     Outcome  Measure   Help from another person eating meals?: None Help from another person taking care of personal grooming?: A Little Help from another person toileting, which includes using toliet, bedpan, or urinal?: A Lot Help from another person bathing (including washing, rinsing, drying)?: A Little Help from another person to put on and taking off regular upper body clothing?: A Little Help from another person to put on and taking off regular lower body clothing?: A Lot 6 Click Score: 17    End of Session    OT Visit Diagnosis: Other abnormalities of gait and mobility (R26.89);Muscle weakness (generalized) (M62.81)   Activity Tolerance Patient tolerated treatment well;Patient limited by pain   Patient Left in bed;with call bell/phone within reach   Nurse Communication          Time: 8546-2703 OT Time Calculation (min): 19 min  Charges: OT General Charges $OT Visit: 1 Visit OT Treatments $Self Care/Home Management : 8-22 mins  Kathie Dike, M.S. OTR/L  04/03/22, 4:31 PM  ascom [phone]

## 2022-04-03 NOTE — Progress Notes (Signed)
Nutrition Follow-up  DOCUMENTATION CODES:   Morbid obesity  INTERVENTION:   -Continue Ensure Enlive po TID, each supplement provides 350 kcal and 20 grams of protein -Continue MVI daily -Double protein portions with meals -RD to sign off due to medical stability; if further nutrition issues arise, please re-consult RD  NUTRITION DIAGNOSIS:   Inadequate oral intake related to inability to eat (pt sedated and ventilated) as evidenced by NPO status.  Progressing; advanced to PO diet on 03/14/22   GOAL:   Patient will meet greater than or equal to 90% of their needs  Progressing   MONITOR:   PO intake, Supplement acceptance, Labs, Weight trends, Skin, I & O's  REASON FOR ASSESSMENT:   Ventilator    ASSESSMENT:   61 y/o male with h/o COPD, DM, HTN, HLD, CHF, polycythemia and alpha gal who is admitted with rhinovirus, PNA and AMS.  9/12- s/p BSE- liberalized diet to regular  Reviewed I/O's: -1.4 L x 24 hours and -18.2 L since 03/20/22  UOP: 1.6 L x 24 hours   Pt remains with good oral intake. Noted meal completions 100%. Pt with variable acceptance of Ensure.   Per MD notes, pt is medically stable for discharge and awaiting SNF placement.   Medications reviewed and include colace and miralax.   Labs reviewed: CBGS: 122-147 (inpatient orders for glycemic control are 0-5 units insulin aspart daily at bedtime and 0-9 units insulin aspart TID with meals).    Diet Order:   Diet Order             Diet regular Room service appropriate? Yes with Assist; Fluid consistency: Thin  Diet effective now                   EDUCATION NEEDS:   No education needs have been identified at this time  Skin:  Skin Assessment: Skin Integrity Issues: Skin Integrity Issues:: Other (Comment), Stage II, DTI DTI: lt lateral hand Stage II: lt hand Other: open wound lt thigh  Last BM:  04/02/22  Height:   Ht Readings from Last 1 Encounters:  02/23/22 5\' 8"  (1.727 m)    Weight:    Wt Readings from Last 1 Encounters:  04/03/22 (!) 136.1 kg    Ideal Body Weight:  70 kg  BMI:  Body mass index is 45.62 kg/m.  Estimated Nutritional Needs:   Kcal:  2297-9892  Protein:  125-140 grams  Fluid:  > 2 L    Loistine Chance, RD, LDN, [place] Registered Dietitian II Certified Diabetes Care and Education Specialist Please refer to [facility] for RD and/or RD on-call/weekend/after hours pager

## 2022-04-03 NOTE — Progress Notes (Signed)
Physical Therapy Treatment Patient Details Name: Bryan Mitchell MRN: [id number] DOB: 1960/09/28 Today's Date: 04/03/2022   History of Present Illness Patient is a 61 year old male with worsening acute on chronic hypoxic/hypercapnic respiratory failure requiring emergent intubation and mechanical ventilatory support. Suspected COPD exacerbation in the setting of HFpEF and possible pulmonary hypertension. Required intuatbion but is now extubated.    PT Comments    Pt presents to PT in bed and agreeable to participate in therapy services. Pt reporting severe pain in feet. Requires no physical support and had no LOB during therapeutic activities during session, but activity tolerance limited by pain. Required frequent cueing to keep himself close to RW during transfers and amb. Would benefit from skilled PT at SNF to address above deficits in strength, functional mobility, balance, and activity tolerance to promote optimal return to PLOF.    Recommendations for follow up therapy are one component of a multi-disciplinary discharge planning process, led by the attending physician.  Recommendations may be updated based on patient status, additional functional criteria and insurance authorization.  Follow Up Recommendations  Skilled nursing-short term rehab (<3 hours/day) Can patient physically be transported by private vehicle: No   Assistance Recommended at Discharge Frequent or constant Supervision/Assistance  Patient can return home with the following Help with stairs or ramp for entrance;Assist for transportation;Assistance with feeding;Direct supervision/assist for medications management;Direct supervision/assist for financial management;Assistance with cooking/housework;A lot of help with walking and/or transfers;A lot of help with bathing/dressing/bathroom   Equipment Recommendations  Other (comment) (TBD)    Recommendations for Other Services       Precautions / Restrictions  Precautions Precautions: Fall Restrictions Weight Bearing Restrictions: No     Mobility  Bed Mobility Overal bed mobility: Modified Independent Bed Mobility: Sit to Supine     Supine to sit: Modified independent (Device/Increase time)          Transfers Overall transfer level: Needs assistance Equipment used: Rolling walker (2 wheels) Transfers: Sit to/from Stand Sit to Stand: Min guard                Ambulation/Gait Ambulation/Gait assistance: Min guard Gait Distance (Feet): 3 Feet Assistive device: Rolling walker (2 wheels) Gait Pattern/deviations: Wide base of support Gait velocity: decreased     General Gait Details: Pt able to take some small steps w RW. Reports severe pain in both feet, which limited session   Stairs             Wheelchair Mobility    Modified Rankin (Stroke Patients Only)       Balance Overall balance assessment: Needs assistance Sitting-balance support: Feet supported Sitting balance-Leahy Scale: Good     Standing balance support: Bilateral upper extremity supported, During functional activity, Reliant on assistive device for balance, No upper extremity supported Standing balance-Leahy Scale: Fair Standing balance comment: no LOB, activity tolerance limited by pain in feet                            Cognition Arousal/Alertness: Awake/alert Behavior During Therapy: WFL for tasks assessed/performed, Agitated Overall Cognitive Status: Within Functional Limits for tasks assessed                                 General Comments: pleasant, agreeable to use of RW, cues for safety        Exercises      General Comments  Pertinent Vitals/Pain Pain Assessment Pain Assessment: 0-10 Pain Score: 10-Worst pain ever (pain in B feet) Pain Descriptors / Indicators: Aching Pain Intervention(s): Limited activity within patient's tolerance, Monitored during session, Patient requesting pain  meds-RN notified    Home Living                          Prior Function            PT Goals (current goals can now be found in the care plan section) Acute Rehab PT Goals Patient Stated Goal: to go home PT Goal Formulation: With patient Time For Goal Achievement: 04/09/22 Potential to Achieve Goals: Fair Progress towards PT goals: Progressing toward goals    Frequency    Min 2X/week      PT Plan Current plan remains appropriate    Co-evaluation              AM-PAC PT "6 Clicks" Mobility   Outcome Measure  Help needed turning from your back to your side while in a flat bed without using bedrails?: None Help needed moving from lying on your back to sitting on the side of a flat bed without using bedrails?: A Little Help needed moving to and from a bed to a chair (including a wheelchair)?: A Little Help needed standing up from a chair using your arms (e.g., wheelchair or bedside chair)?: A Little Help needed to walk in hospital room?: A Lot Help needed climbing 3-5 steps with a railing? : Total 6 Click Score: 16    End of Session Equipment Utilized During Treatment: Oxygen Activity Tolerance: Patient limited by pain Patient left: in chair;with call bell/phone within reach;with chair alarm set Nurse Communication: Mobility status PT Visit Diagnosis: Unsteadiness on feet (R26.81);Muscle weakness (generalized) (M62.81);Pain Pain - Right/Left: Left Pain - part of body: Ankle and joints of foot     Time: 2751-7001 PT Time Calculation (min) (ACUTE ONLY): 28 min  Charges:                       Odette Horns MPH, SPT 04/03/22, 1:33 PM

## 2022-04-04 LAB — BASIC METABOLIC PANEL
Anion gap: 6 (ref 5–15)
BUN: 21 mg/dL — ABNORMAL HIGH (ref 6–20)
CO2: 34 mmol/L — ABNORMAL HIGH (ref 22–32)
Calcium: 8.8 mg/dL — ABNORMAL LOW (ref 8.9–10.3)
Chloride: 98 mmol/L (ref 98–111)
Creatinine, Ser: 0.57 mg/dL — ABNORMAL LOW (ref 0.61–1.24)
GFR, Estimated: >60 mL/min (ref >60)
Glucose, Bld: 130 mg/dL — ABNORMAL HIGH (ref 70–99)
Potassium: 4.1 mmol/L (ref 3.5–5.1)
Sodium: 138 mmol/L (ref 135–145)

## 2022-04-04 LAB — CBC
HCT: 49 % (ref 39.0–52.0)
Hemoglobin: 15.1 g/dL (ref 13.0–17.0)
MCH: 28.2 pg (ref 26.0–34.0)
MCHC: 30.8 g/dL (ref 30.0–36.0)
MCV: 91.6 fL (ref 80.0–100.0)
Platelets: 118 10*3/uL — ABNORMAL LOW (ref 150–400)
RBC: 5.35 MIL/uL (ref 4.22–5.81)
RDW: 15.3 % (ref 11.5–15.5)
WBC: 7.1 10*3/uL (ref 4.0–10.5)
nRBC: 0 % (ref 0.0–0.2)

## 2022-04-04 LAB — PHOSPHORUS: Phosphorus: 3.6 mg/dL (ref 2.5–4.6)

## 2022-04-04 LAB — GLUCOSE, CAPILLARY
Glucose-Capillary: 125 mg/dL — ABNORMAL HIGH (ref 70–99)
Glucose-Capillary: 154 mg/dL — ABNORMAL HIGH (ref 70–99)
Glucose-Capillary: 139 mg/dL — ABNORMAL HIGH (ref 70–99)

## 2022-04-04 LAB — MAGNESIUM: Magnesium: 1.7 mg/dL (ref 1.7–2.4)

## 2022-04-04 MED ORDER — OXYCODONE-ACETAMINOPHEN 5-325 MG PO TABS
1.0000 | ORAL_TABLET | Freq: Four times a day (QID) | ORAL | Status: DC | PRN
  Administered 2022-04-04 – 2022-04-12 (×29): 1 via ORAL
  Filled 2022-04-04 (×30): qty 1

## 2022-04-04 MED ORDER — ASPIRIN 81 MG PO TABS: 81.0000 mg | ORAL_TABLET | Freq: Every day | ORAL | Status: DC

## 2022-04-04 MED ORDER — FUROSEMIDE 40 MG PO TABS
40.0000 mg | ORAL_TABLET | Freq: Every day | ORAL | Status: DC
  Administered 2022-04-04 – 2022-04-11 (×8): 40 mg via ORAL
  Filled 2022-04-04 (×8): qty 1

## 2022-04-04 NOTE — Progress Notes (Signed)
PROGRESS NOTE    Bryan Mitchell  [id number] DOB: 05-Jan-1961 DOA: 02/23/2022  PCP: Pcp, No   Brief Narrative:  This 61 yrs old Male presenting to [facility] ED on 02/23/22 from home in a wheelchair with his sister for evaluation of worsening fatigue, poor appetite and SOB over the past 2 weeks. Per ED documentation, Patient also complained of worsening bilateral leg swelling and pain.  Patient was admitted for acute on chronic hypoxic and hypercapnic respiratory failure likely secondary to COPD exacerbation and rhinovirus infection which is now resolved.  PT and OT recommended SNF,  awaiting placement.  Assessment & Plan:   Principal Problem:   Acute hypoxemic respiratory failure (HCC) Active Problems:   Tobacco abuse   Morbid obesity (HCC)   COPD exacerbation (HCC)   Type 2 diabetes mellitus with complication, without long-term current use of insulin (HCC)   Acute respiratory failure (HCC)   Pressure injury of skin   Toxic metabolic encephalopathy   Anisocoria  Acute on chronic hypoxic and hypercapnic respiratory failure: Likely multifactorial due to COPD exacerbation, OSA, Acute on Chronic diastolic heart failure, rhinovirus infection. Rhinovirus infection > resolved.  Patient successfully extubated and placed on HFNC , currently on 3L. Wean from supplemental oxygen as tolerated.  No respiratory distress.   Acute on chronic diastolic heart failure: Pulmonary hypertension Echo shows LVEF 60 to 123456, grade 2 diastolic dysfunction, pulmonary hypertension. Lasix was held due to hypernatremia.  Resume Lasix 40 mg daily. Appears euvolemic on exam.   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   Type 2 diabetes with morbid obesity > New diagnosis Hb A1c 6.5.  was not on insulin PTA. Continue Metformin 850 mg bid . Continue regular insulin sliding scale.    Urinary retention: Foley removed on 03/18/22, failed voiding trial needing I/O cath re-inserted Foley on  9/13 Continue Flomax.  Consider voiding trial 9/28   Intermittent confusion: Patient continues to have intermittent confusion with agitation likely secondary to hospital delirium. Seroquel is kept on hold until mentation is stable. Patient seems improved. Seroquel resumed.   Morbid obesity BMI 48: Diet and exercise discussed in detail. Estimated body mass index is 45.99 kg/m as calculated from the following:   Height as of this encounter: 5\' 8"  (1.727 m).   Weight as of this encounter: 137.2 kg.    Obstructive sleep apnea: Continue BiPAP at night. Needs outpatient sleep studies.   Bilateral leg and foot pain: He complains of pain in both feet, and tender to palpation on the bottom of both feet. He completed short course of steroids Left ankle xray shows changes of OA Continue diclofenac gel Trial of Gabapentin given c/o numbness. Start Percocet 5 mg every 6 hours.    DVT prophylaxis: Arixtra Code Status: Full code Family Communication: No family at bedside Disposition Plan:    Status is: Inpatient Remains inpatient appropriate because: Admitted for acute on chronic hypoxic and hypercapnic respiratory failure secondary to acute on chronic CHF and COPD exacerbation.  Now medically stable, awaiting SNF placement.  No bed offers yet.  Anticipated discharge to SNF 1 to 2 days.   Consultants:  None  Procedures: None Antimicrobials:  Anti-infectives (From admission, onward)    Start     Dose/Rate Route Frequency Ordered Stop   02/23/22 2300  cefTRIAXone (ROCEPHIN) 1 g in sodium chloride 0.9 % 100 mL IVPB        1 g 200 mL/hr over 30 Minutes Intravenous Every 24 hours 02/23/22 2241 02/27/22 2255  02/23/22 1915  doxycycline (VIBRAMYCIN) 100 mg in sodium chloride 0.9 % 250 mL IVPB        100 mg 125 mL/hr over 120 Minutes Intravenous Every 12 hours 02/23/22 1902 02/27/22 2205      Subjective: Patient was seen and examined at bedside.  Overnight events noted.   Patient  continued to complain about having severe pain in both legs and states he is not able to move around. Patient still having his Foley catheter.  He is agreeable for voiding trial today.   Objective: Vitals:   04/03/22 2152 04/04/22 0143 04/04/22 0513 04/04/22 0759  BP:   99/62 136/78  Pulse: 94  97 100  Resp:   18 17  Temp:   98.7 F (37.1 C) 98.2 F (36.8 C)  TempSrc:      SpO2: 92%  95% 94%  Weight:  (!) 137.2 kg    Height:        Intake/Output Summary (Last 24 hours) at 04/04/2022 1225 Last data filed at 04/04/2022 0900 Gross per 24 hour  Intake 960 ml  Output 1900 ml  Net -940 ml   Filed Weights   04/02/22 0405 04/03/22 0335 04/04/22 0143  Weight: 135.5 kg (!) 136.1 kg (!) 137.2 kg    Examination:  General exam: Appears comfortable, not in any acute distress. Respiratory system: CTA bilaterally, normal respiratory effort, RR 15 Cardiovascular system: S1 & S2 heard, regular rate and rhythm, no murmur. Gastrointestinal system: Abdomen is soft, non tender, non distended, BS+. Central nervous system: Alert and oriented x3. No focal neurological deficits. Extremities: No edema, no cyanosis, no clubbing. Skin: No rashes, lesions or ulcers Psychiatry: Judgement and insight appear normal. Mood & affect appropriate.    Data Reviewed: I have personally reviewed following labs and imaging studies  CBC: Recent Labs  Lab 04/04/22 0540  WBC 7.1  HGB 15.1  HCT 49.0  MCV 91.6  PLT 123456*   Basic Metabolic Panel: Recent Labs  Lab 04/04/22 0540  NA 138  K 4.1  CL 98  CO2 34*  GLUCOSE 130*  BUN 21*  CREATININE 0.57*  CALCIUM 8.8*  MG 1.7  PHOS 3.6   GFR: Estimated Creatinine Clearance: 133.2 mL/min (A) (by C-G formula based on SCr of 0.57 mg/dL (L)). Liver Function Tests: No results for input(s): "AST", "ALT", "ALKPHOS", "BILITOT", "PROT", "ALBUMIN" in the last 168 hours. No results for input(s): "LIPASE", "AMYLASE" in the last 168 hours. No results for  input(s): "AMMONIA" in the last 168 hours. Coagulation Profile: No results for input(s): "INR", "PROTIME" in the last 168 hours. Cardiac Enzymes: No results for input(s): "CKTOTAL", "CKMB", "CKMBINDEX", "TROPONINI" in the last 168 hours. BNP (last 3 results) No results for input(s): "PROBNP" in the last 8760 hours. HbA1C: No results for input(s): "HGBA1C" in the last 72 hours. CBG: Recent Labs  Lab 04/03/22 1237 04/03/22 1657 04/03/22 2017 04/04/22 0802 04/04/22 1203  GLUCAP 147* 164* 144* 125* 154*   Lipid Profile: No results for input(s): "CHOL", "HDL", "LDLCALC", "TRIG", "CHOLHDL", "LDLDIRECT" in the last 72 hours. Thyroid Function Tests: No results for input(s): "TSH", "T4TOTAL", "FREET4", "T3FREE", "THYROIDAB" in the last 72 hours. Anemia Panel: No results for input(s): "VITAMINB12", "FOLATE", "FERRITIN", "TIBC", "IRON", "RETICCTPCT" in the last 72 hours. Sepsis Labs: No results for input(s): "PROCALCITON", "LATICACIDVEN" in the last 168 hours.  No results found for this or any previous visit (from the past 240 hour(s)).   Radiology Studies: No results found.  Scheduled Meds:  aspirin  EC  81 mg Oral Daily   Chlorhexidine Gluconate Cloth  6 each Topical Daily   docusate sodium  100 mg Oral BID   feeding supplement  237 mL Oral TID BM   fondaparinux (ARIXTRA) injection  2.5 mg Subcutaneous Q24H   furosemide  40 mg Oral Daily   gabapentin  200 mg Oral Daily   insulin aspart  0-5 Units Subcutaneous QHS   insulin aspart  0-9 Units Subcutaneous TID WC   metFORMIN  850 mg Oral BID WC   metoprolol tartrate  12.5 mg Oral BID   multivitamin with minerals  1 tablet Oral Daily   polyethylene glycol  17 g Oral Daily   tamsulosin  0.4 mg Oral Daily   Continuous Infusions:   LOS: 40 days    Time spent: 35 mins    Aino Heckert, MD [facility]   If 7PM-7AM, please contact night-coverage

## 2022-04-04 NOTE — Progress Notes (Signed)
Occupational Therapy Treatment Patient Details Name: Bryan Mitchell MRN: [id number] DOB: October 07, 1960 Today's Date: 04/04/2022   History of present illness Patient is a 61 year old male with worsening acute on chronic hypoxic/hypercapnic respiratory failure requiring emergent intubation and mechanical ventilatory support. Suspected COPD exacerbation in the setting of HFpEF and possible pulmonary hypertension. Required intuatbion but is now extubated.   OT comments  Bryan Mitchell was seen for OT treatment on this date. Upon arrival to room pt reclined in bed, agreeable to tx. Pt requires MAX A don/doff B socks and shoes in sitting. Pt continues to c/o L foot pain, reports finally had pain relief from medication and requires encouragement to participate. CGA + RW for shower t/f, tolerates walking ~10 ft. SUPERVISION for UB bathing and hair brushing in sitting. Pt making good progress toward goals, will continue to follow POC. Discharge recommendation remains appropriate.     Recommendations for follow up therapy are one component of a multi-disciplinary discharge planning process, led by the attending physician.  Recommendations may be updated based on patient status, additional functional criteria and insurance authorization.    Follow Up Recommendations  Skilled nursing-short term rehab (<3 hours/day)    Assistance Recommended at Discharge Frequent or constant Supervision/Assistance  Patient can return home with the following  A lot of help with walking and/or transfers;A lot of help with bathing/dressing/bathroom;Help with stairs or ramp for entrance   Equipment Recommendations  BSC/3in1    Recommendations for Other Services      Precautions / Restrictions Precautions Precautions: Fall Restrictions Weight Bearing Restrictions: No       Mobility Bed Mobility Overal bed mobility: Modified Independent                  Transfers Overall transfer level: Needs assistance Equipment  used: Rolling walker (2 wheels) Transfers: Sit to/from Stand, Bed to chair/wheelchair/BSC Sit to Stand: Min guard Stand pivot transfers: Min guard               Balance Overall balance assessment: Needs assistance Sitting-balance support: Feet supported Sitting balance-Leahy Scale: Good     Standing balance support: Bilateral upper extremity supported Standing balance-Leahy Scale: Fair                             ADL either performed or assessed with clinical judgement   ADL Overall ADL's : Needs assistance/impaired                                       General ADL Comments: MAX A don/doff B socks and shoes in sitting. CGA + RW for shower t/f, tolerates walking ~10 ft. SUPERVISION for UB bathing and hair brushing in sitting.      Cognition Arousal/Alertness: Awake/alert Behavior During Therapy: WFL for tasks assessed/performed, Agitated Overall Cognitive Status: Within Functional Limits for tasks assessed                                                     Pertinent Vitals/ Pain       Pain Assessment Pain Assessment: Faces Faces Pain Scale: Hurts even more Pain Location: L ankle and toes Pain Descriptors / Indicators: Aching Pain Intervention(s): Premedicated before session  Frequency  Min 2X/week        Progress Toward Goals  OT Goals(current goals can now be found in the care plan section)  Progress towards OT goals: Progressing toward goals  Acute Rehab OT Goals Patient Stated Goal: to go home OT Goal Formulation: With patient Time For Goal Achievement: 04/08/22 Potential to Achieve Goals: Fair ADL Goals Pt Will Perform Grooming: with min assist;standing Pt Will Perform Upper Body Dressing: with min assist;standing Pt Will Transfer to Toilet: stand pivot transfer;bedside commode;with min guard assist Pt/caregiver will Perform Home Exercise Program: Increased ROM;Increased strength;Both right and  left upper extremity  Plan Discharge plan remains appropriate;Frequency remains appropriate    Co-evaluation                 AM-PAC OT "6 Clicks" Daily Activity     Outcome Measure   Help from another person eating meals?: None Help from another person taking care of personal grooming?: A Little Help from another person toileting, which includes using toliet, bedpan, or urinal?: A Lot Help from another person bathing (including washing, rinsing, drying)?: A Little Help from another person to put on and taking off regular upper body clothing?: A Little Help from another person to put on and taking off regular lower body clothing?: A Lot 6 Click Score: 17    End of Session Equipment Utilized During Treatment: Rolling walker (2 wheels)  OT Visit Diagnosis: Other abnormalities of gait and mobility (R26.89);Muscle weakness (generalized) (M62.81)   Activity Tolerance Patient tolerated treatment well;Patient limited by pain   Patient Left in chair;with call bell/phone within reach;with nursing/sitter in room   Nurse Communication Mobility status        Time: 9323-5573 OT Time Calculation (min): 33 min  Charges: OT General Charges $OT Visit: 1 Visit OT Treatments $Self Care/Home Management : 23-37 mins  Dessie Coma, M.S. OTR/L  04/04/22, 1:17 PM  ascom [phone]

## 2022-04-04 NOTE — TOC Progression Note (Signed)
Transition of Care [facility]) - Progression Note    Patient Details  Name: Bryan Mitchell MRN: [id number] Date of Birth: [birth year]  Transition of Care [facility]) CM/SW [place], [place] Phone Number: 04/04/2022, 10:44 AM  Clinical Narrative:    [facility] is reviewing pt for possible admission. Liaison will follow up with next steps. TOC to provide LOG, will continue to follow for DC needs.   Expected Discharge Plan: [place] Barriers to Discharge: Continued Medical Work up  Expected Discharge Plan and Services Expected Discharge Plan: [place]   Discharge Planning Services: CM Consult Post Acute Care Choice: [place] Living arrangements for the past 2 months: Single Family Home                                       Social Determinants of Health (SDOH) Interventions    Readmission Risk Interventions     No data to display

## 2022-04-05 LAB — URINALYSIS, ROUTINE W REFLEX MICROSCOPIC
Bilirubin Urine: NEGATIVE
Glucose, UA: NEGATIVE mg/dL
Ketones, ur: NEGATIVE mg/dL
Nitrite: NEGATIVE
Protein, ur: 30 mg/dL — AB
Specific Gravity, Urine: 1.016 (ref 1.005–1.030)
Squamous Epithelial / HPF: NONE SEEN (ref 0–5)
WBC, UA: >50 WBC/hpf — ABNORMAL HIGH (ref 0–5)
pH: 5 (ref 5.0–8.0)

## 2022-04-05 LAB — GLUCOSE, CAPILLARY
Glucose-Capillary: 177 mg/dL — ABNORMAL HIGH (ref 70–99)
Glucose-Capillary: 187 mg/dL — ABNORMAL HIGH (ref 70–99)
Glucose-Capillary: 196 mg/dL — ABNORMAL HIGH (ref 70–99)
Glucose-Capillary: 215 mg/dL — ABNORMAL HIGH (ref 70–99)

## 2022-04-05 NOTE — Progress Notes (Signed)
PROGRESS NOTE    Bryan Mitchell  WCH:[id number] DOB: 1961-04-02 DOA: 02/23/2022  PCP: Pcp, No   Brief Narrative:  This 61 yrs old Male presenting to [facility] ED on 02/23/22 from home in a wheelchair with his sister for evaluation of worsening fatigue, poor appetite and SOB over the past 2 weeks. Per ED documentation, Patient also complained of worsening bilateral leg swelling and pain.  Patient was admitted for acute on chronic hypoxic and hypercapnic respiratory failure likely secondary to COPD exacerbation and rhinovirus infection which is now resolved.  PT and OT recommended SNF,  awaiting placement.  Assessment & Plan:   Principal Problem:   Acute hypoxemic respiratory failure (HCC) Active Problems:   Tobacco abuse   Morbid obesity (HCC)   COPD exacerbation (HCC)   Type 2 diabetes mellitus with complication, without long-term current use of insulin (HCC)   Acute respiratory failure (HCC)   Pressure injury of skin   Toxic metabolic encephalopathy   Anisocoria  Acute on chronic hypoxic and hypercapnic respiratory failure: Likely multifactorial due to COPD exacerbation, OSA, Acute on Chronic diastolic heart failure, rhinovirus infection. Rhinovirus infection > resolved.  Patient successfully extubated and placed on HFNC , currently on 3L. Wean from supplemental oxygen as tolerated.  No respiratory distress.   Acute on chronic diastolic heart failure: Pulmonary hypertension Echo shows LVEF 60 to 35%, grade 2 diastolic dysfunction, pulmonary hypertension. Lasix was held due to hypernatremia.  Resume Lasix 40 mg daily. Appears euvolemic on exam.   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   Type 2 diabetes with morbid obesity > New diagnosis Hb A1c 6.5.  was not on insulin PTA. Continue Metformin 850 mg bid . Continue regular insulin sliding scale.    Urinary retention: > Resolved. Foley removed on 03/18/22, failed voiding trial needing I/O cath re-inserted Foley on  9/13 Continue Flomax.  Patient successfully voided. Urinary retention resolved.   Intermittent confusion: Patient continues to have intermittent confusion with agitation likely secondary to hospital delirium. Seroquel is kept on hold until mentation is stable. Patient seems improved. Seroquel resumed.   Morbid obesity BMI 48: Diet and exercise discussed in detail. Estimated body mass index is 45.35 kg/m as calculated from the following:   Height as of this encounter: 5\' 8"  (1.727 m).   Weight as of this encounter: 135.3 kg.    Obstructive sleep apnea: Continue BiPAP at night. Needs outpatient sleep studies.   Bilateral leg and foot pain: He complains of pain in both feet, and tender to palpation on the bottom of both feet. He completed short course of steroids. Left ankle xray shows changes of OA Continue diclofenac gel Trial of Gabapentin given c/o numbness. Continue Percocet 5 mg every 6 hours.    DVT prophylaxis: Arixtra Code Status: Full code Family Communication: No family at bedside Disposition Plan:    Status is: Inpatient Remains inpatient appropriate because: Admitted for acute on chronic hypoxic and hypercapnic respiratory failure secondary to acute on chronic CHF and COPD exacerbation.  Now medically stable, awaiting SNF placement.  No bed offers yet.  Anticipated discharge to SNF 1 to 2 days.   Consultants:  None  Procedures: None Antimicrobials:  Anti-infectives (From admission, onward)    Start     Dose/Rate Route Frequency Ordered Stop   02/23/22 2300  cefTRIAXone (ROCEPHIN) 1 g in sodium chloride 0.9 % 100 mL IVPB        1 g 200 mL/hr over 30 Minutes Intravenous Every 24 hours 02/23/22  2241 02/27/22 2255   02/23/22 1915  doxycycline (VIBRAMYCIN) 100 mg in sodium chloride 0.9 % 250 mL IVPB        100 mg 125 mL/hr over 120 Minutes Intravenous Every 12 hours 02/23/22 1902 02/27/22 2205      Subjective: Patient was seen and examined at bedside.   Overnight events noted.   Patient reports pain is now reasonably controlled.  He has not tried to move around. Patient has successfully voided, Foley catheter removed.  Objective: Vitals:   04/04/22 2039 04/05/22 0409 04/05/22 0500 04/05/22 0841  BP: 124/68 (!) 101/59  117/69  Pulse: (!) 106 88  97  Resp:  20  20  Temp:  98.2 F (36.8 C)  97.9 F (36.6 C)  TempSrc:      SpO2:  (!) 89%  91%  Weight:   135.3 kg   Height:        Intake/Output Summary (Last 24 hours) at 04/05/2022 1057 Last data filed at 04/05/2022 0800 Gross per 24 hour  Intake 480 ml  Output 1500 ml  Net -1020 ml   Filed Weights   04/03/22 0335 04/04/22 0143 04/05/22 0500  Weight: (!) 136.1 kg (!) 137.2 kg 135.3 kg    Examination:  General exam: Appears comfortable, NAD, morbidly obese. Respiratory system: CTA bilaterally, respiratory effort normal, RR 16. Cardiovascular system: S1 & S2 heard, regular rate and rhythm, no murmur. Gastrointestinal system: Abdomen is soft, non tender, non distended, BS+. Central nervous system: Alert and oriented x3. No focal neurological deficits. Extremities: No edema, no cyanosis, no clubbing. Skin: No rashes, lesions or ulcers Psychiatry: Judgement and insight appear normal. Mood & affect appropriate.    Data Reviewed: I have personally reviewed following labs and imaging studies  CBC: Recent Labs  Lab 04/04/22 0540  WBC 7.1  HGB 15.1  HCT 49.0  MCV 91.6  PLT 123456*   Basic Metabolic Panel: Recent Labs  Lab 04/04/22 0540  NA 138  K 4.1  CL 98  CO2 34*  GLUCOSE 130*  BUN 21*  CREATININE 0.57*  CALCIUM 8.8*  MG 1.7  PHOS 3.6   GFR: Estimated Creatinine Clearance: 132.2 mL/min (A) (by C-G formula based on SCr of 0.57 mg/dL (L)). Liver Function Tests: No results for input(s): "AST", "ALT", "ALKPHOS", "BILITOT", "PROT", "ALBUMIN" in the last 168 hours. No results for input(s): "LIPASE", "AMYLASE" in the last 168 hours. No results for input(s):  "AMMONIA" in the last 168 hours. Coagulation Profile: No results for input(s): "INR", "PROTIME" in the last 168 hours. Cardiac Enzymes: No results for input(s): "CKTOTAL", "CKMB", "CKMBINDEX", "TROPONINI" in the last 168 hours. BNP (last 3 results) No results for input(s): "PROBNP" in the last 8760 hours. HbA1C: No results for input(s): "HGBA1C" in the last 72 hours. CBG: Recent Labs  Lab 04/03/22 2017 04/04/22 0802 04/04/22 1203 04/04/22 1720 04/05/22 0839  GLUCAP 144* 125* 154* 139* 177*   Lipid Profile: No results for input(s): "CHOL", "HDL", "LDLCALC", "TRIG", "CHOLHDL", "LDLDIRECT" in the last 72 hours. Thyroid Function Tests: No results for input(s): "TSH", "T4TOTAL", "FREET4", "T3FREE", "THYROIDAB" in the last 72 hours. Anemia Panel: No results for input(s): "VITAMINB12", "FOLATE", "FERRITIN", "TIBC", "IRON", "RETICCTPCT" in the last 72 hours. Sepsis Labs: No results for input(s): "PROCALCITON", "LATICACIDVEN" in the last 168 hours.  No results found for this or any previous visit (from the past 240 hour(s)).   Radiology Studies: No results found.  Scheduled Meds:  aspirin EC  81 mg Oral Daily  Chlorhexidine Gluconate Cloth  6 each Topical Daily   docusate sodium  100 mg Oral BID   feeding supplement  237 mL Oral TID BM   fondaparinux (ARIXTRA) injection  2.5 mg Subcutaneous Q24H   furosemide  40 mg Oral Daily   gabapentin  200 mg Oral Daily   insulin aspart  0-5 Units Subcutaneous QHS   insulin aspart  0-9 Units Subcutaneous TID WC   metFORMIN  850 mg Oral BID WC   metoprolol tartrate  12.5 mg Oral BID   multivitamin with minerals  1 tablet Oral Daily   polyethylene glycol  17 g Oral Daily   tamsulosin  0.4 mg Oral Daily   Continuous Infusions:   LOS: 41 days    Time spent: 35 mins    Adiah Guereca, MD [facility]   If 7PM-7AM, please contact night-coverage

## 2022-04-05 NOTE — TOC Progression Note (Addendum)
Transition of Care [facility]) - Progression Note    Patient Details  Name: Bryan Mitchell MRN: [id number] Date of Birth: February 11, 1961  Transition of Care [facility]) CM/SW [place], [place] Phone Number: 04/05/2022, 12:31 PM  Clinical Narrative:    CSW spoke with [place] at [facility], to follow up on possible placement. [place] notes she is actively reviewing pt now with her business office, but needed questions answered. CSW supplied pt's weight and confirmed pt is currently oriented with no recent documented behaviors. [facility] will need to know the status of pt's Medicaid and bipap info prior to final acceptance.  CSW asked RN who advised pt was only on bipap in the ICU and is now not using one. CSW left VM to follow up with pt's sister who is handling the Medicaid. TOC will continue to follow for DC needs.   12:55 CSW received call from Butch Penny, pt's sister who advised that she sent in all of pt's Medicaid info, and has been calling to follow up on application, but has not been able to get through to anyone. She requested the name of the facility reviewing. She also asked what specific benefits pt would get at the facility, and why he was recommended to go. CSW explained that in pt's case he was needing a lot of care, that would be all for the family to do if he went home. They are also recommending that he get PT/OT to help him get stronger. Sister noted understanding and stated she had been waiting for chaplain to deliver [place] papers, per chart, they were left in room. TOC will continue to follow for DC needs. Expected Discharge Plan: [place] Barriers to Discharge: Continued Medical Work up  Expected Discharge Plan and Services Expected Discharge Plan: [place]   Discharge Planning Services: CM Consult Post Acute Care Choice: [place] Living arrangements for the past 2 months: Single Family Home                                        Social Determinants of Health (SDOH) Interventions    Readmission Risk Interventions     No data to display

## 2022-04-05 NOTE — Progress Notes (Signed)
   04/05/22 1200  Clinical Encounter Type  Visited With Patient not available  Visit Type Follow-up  Referral From Family  Consult/Referral To Chaplain   Chaplain responded to call from sister of patient who requested HCPOA forms. Chaplain provided forms in room. Patient was sleeping at the time.

## 2022-04-05 NOTE — Progress Notes (Signed)
Physical Therapy Treatment Patient Details Name: Bryan Mitchell MRN: [id number] DOB: October 03, 1960 Today's Date: 04/05/2022   History of Present Illness Patient is a 61 year old male with worsening acute on chronic hypoxic/hypercapnic respiratory failure requiring emergent intubation and mechanical ventilatory support. Suspected COPD exacerbation in the setting of HFpEF and possible pulmonary hypertension. Required intuatbion but is now extubated.    PT Comments    Pt presents to PT in bed and requires significant encouragement to participate in therapy services.  Required no physical assist to perform transfers or amb. Attempted to provide cueing and assistance for transfer from bed to chair. Pt declined safety related assistance for transfer from bed to recliner, additionally declined to use RW for assistance. Pt impulsively transferred himself to chair but in a very effortful, slow manner. Mobility continues to be limited by pain. Would benefit from skilled PT at SNF to address above deficits in strength, functional mobility, activity tolerance, and balance and promote optimal return to PLOF.   Recommendations for follow up therapy are one component of a multi-disciplinary discharge planning process, led by the attending physician.  Recommendations may be updated based on patient status, additional functional criteria and insurance authorization.  Follow Up Recommendations  Skilled nursing-short term rehab (<3 hours/day) Can patient physically be transported by private vehicle: No   Assistance Recommended at Discharge Frequent or constant Supervision/Assistance  Patient can return home with the following Help with stairs or ramp for entrance;Assist for transportation;Assistance with feeding;Direct supervision/assist for medications management;Direct supervision/assist for financial management;Assistance with cooking/housework;A lot of help with walking and/or transfers;A lot of help with  bathing/dressing/bathroom   Equipment Recommendations  Other (comment) (TBD)    Recommendations for Other Services       Precautions / Restrictions Precautions Precautions: Fall Restrictions Weight Bearing Restrictions: No     Mobility  Bed Mobility Overal bed mobility: Modified Independent Bed Mobility: Supine to Sit     Supine to sit: Modified independent (Device/Increase time)          Transfers Overall transfer level: Needs assistance Equipment used: Transfers: Bed to chair/wheelchair/BSC Assistance: min guard                  Ambulation/Gait   Gait Distance (Feet): 3 Feet Assistive device:  (2 feet during side steps from bed to chair) Gait Pattern/deviations: Wide base of support Gait velocity: decreased Assistance: min guard     General Gait Details: Several shuffling side steps while leaning on chair arm rest for support. Declined repeated attempts to get pt to use RW. Reports severe pain in both feet, which limited session   Stairs             Wheelchair Mobility    Modified Rankin (Stroke Patients Only)       Balance Overall balance assessment: Needs assistance Sitting-balance support: Feet supported Sitting balance-Leahy Scale: Good     Standing balance support: Bilateral upper extremity supported Standing balance-Leahy Scale: Fair Standing balance comment: no LOB, activity tolerance limited by pain in feet                            Cognition Arousal/Alertness: Awake/alert Behavior During Therapy: WFL for tasks assessed/performed, Agitated Overall Cognitive Status: Within Functional Limits for tasks assessed                                 General  Comments: continues to be limited by pain        Exercises Total Joint Exercises Ankle Circles/Pumps: AROM, Both, 10 reps Quad Sets: Strengthening, Both, 10 reps Heel Slides: Strengthening, Both, 10 reps Hip ABduction/ADduction: Strengthening,  Both, 10 reps Long Arc Quad: Strengthening, Both, 10 reps    General Comments        Pertinent Vitals/Pain Pain Assessment Pain Assessment: PAINAD Breathing: normal Negative Vocalization: repeated troubled calling out, loud moaning/groaning, crying Facial Expression: facial grimacing Body Language: tense, distressed pacing, fidgeting Consolability: distracted or reassured by voice/touch PAINAD Score: 6 Pain Descriptors / Indicators: Grimacing, Stabbing Pain Intervention(s): Limited activity within patient's tolerance, Monitored during session, Repositioned    Home Living                          Prior Function            PT Goals (current goals can now be found in the care plan section) Acute Rehab PT Goals Patient Stated Goal: to go home PT Goal Formulation: With patient Time For Goal Achievement: 04/09/22 Potential to Achieve Goals: Fair Progress towards PT goals: Not progressing toward goals - comment (remains limited by pain in his feet)    Frequency    Min 2X/week      PT Plan Current plan remains appropriate    Co-evaluation              AM-PAC PT "6 Clicks" Mobility   Outcome Measure  Help needed turning from your back to your side while in a flat bed without using bedrails?: None Help needed moving from lying on your back to sitting on the side of a flat bed without using bedrails?: A Little Help needed moving to and from a bed to a chair (including a wheelchair)?: A Little Help needed standing up from a chair using your arms (e.g., wheelchair or bedside chair)?: A Little Help needed to walk in hospital room?: A Lot Help needed climbing 3-5 steps with a railing? : Total 6 Click Score: 16    End of Session Equipment Utilized During Treatment: Oxygen;Gait belt Activity Tolerance: Patient limited by pain Patient left: in chair;with call bell/phone within reach;with chair alarm set Nurse Communication: Mobility status PT Visit  Diagnosis: Unsteadiness on feet (R26.81);Muscle weakness (generalized) (M62.81);Pain Pain - Right/Left: Left     Time: 0272-5366 PT Time Calculation (min) (ACUTE ONLY): 24 min  Charges:                        Odette Horns MPH, SPT 04/05/22, 11:35 AM

## 2022-04-05 NOTE — Progress Notes (Signed)
Pt is adamant that he is not a diabetic; refused metformin and insulin. MD aware.

## 2022-04-05 NOTE — Progress Notes (Signed)
   04/05/22 1315  Clinical Encounter Type  Visited With Patient  Visit Type Follow-up   Chaplain facilitated completion of HCPOA.

## 2022-04-06 LAB — BASIC METABOLIC PANEL
Anion gap: 7 (ref 5–15)
BUN: 20 mg/dL (ref 6–20)
CO2: 30 mmol/L (ref 22–32)
Calcium: 8.8 mg/dL — ABNORMAL LOW (ref 8.9–10.3)
Chloride: 99 mmol/L (ref 98–111)
Creatinine, Ser: 0.62 mg/dL (ref 0.61–1.24)
GFR, Estimated: >60 mL/min (ref >60)
Glucose, Bld: 172 mg/dL — ABNORMAL HIGH (ref 70–99)
Potassium: 4.1 mmol/L (ref 3.5–5.1)
Sodium: 136 mmol/L (ref 135–145)

## 2022-04-06 LAB — CBC
HCT: 46.2 % (ref 39.0–52.0)
Hemoglobin: 14.4 g/dL (ref 13.0–17.0)
MCH: 28.6 pg (ref 26.0–34.0)
MCHC: 31.2 g/dL (ref 30.0–36.0)
MCV: 91.7 fL (ref 80.0–100.0)
Platelets: 163 10*3/uL (ref 150–400)
RBC: 5.04 MIL/uL (ref 4.22–5.81)
RDW: 15.5 % (ref 11.5–15.5)
WBC: 6.2 10*3/uL (ref 4.0–10.5)
nRBC: 0 % (ref 0.0–0.2)

## 2022-04-06 LAB — GLUCOSE, CAPILLARY
Glucose-Capillary: 152 mg/dL — ABNORMAL HIGH (ref 70–99)
Glucose-Capillary: 247 mg/dL — ABNORMAL HIGH (ref 70–99)
Glucose-Capillary: 139 mg/dL — ABNORMAL HIGH (ref 70–99)
Glucose-Capillary: 195 mg/dL — ABNORMAL HIGH (ref 70–99)

## 2022-04-06 LAB — PHOSPHORUS: Phosphorus: 3.5 mg/dL (ref 2.5–4.6)

## 2022-04-06 LAB — MAGNESIUM: Magnesium: 1.8 mg/dL (ref 1.7–2.4)

## 2022-04-06 MED ORDER — SODIUM CHLORIDE 0.9 % IV SOLN
1.0000 g | INTRAVENOUS | Status: AC
  Administered 2022-04-06 – 2022-04-08 (×3): 1 g via INTRAVENOUS
  Filled 2022-04-06 (×2): qty 10
  Filled 2022-04-06: qty 1

## 2022-04-06 MED ORDER — GABAPENTIN 100 MG PO CAPS
200.0000 mg | ORAL_CAPSULE | Freq: Three times a day (TID) | ORAL | Status: DC
  Administered 2022-04-06 – 2022-04-12 (×18): 200 mg via ORAL
  Filled 2022-04-06 (×18): qty 2

## 2022-04-06 NOTE — Progress Notes (Signed)
PROGRESS NOTE    Bryan Mitchell  [id number] DOB: 1960-12-30 DOA: 02/23/2022  PCP: Pcp, No   Brief Narrative:  This 61 yrs old Male presenting to [facility] ED on 02/23/22 from home in a wheelchair with his sister for evaluation of worsening fatigue, poor appetite and SOB over the past 2 weeks. Per ED documentation, Patient also complained of worsening bilateral leg swelling and pain.  Patient was admitted for acute on chronic hypoxic and hypercapnic respiratory failure likely secondary to COPD exacerbation and rhinovirus infection which is now resolved.  PT and OT recommended SNF,  awaiting placement.  Assessment & Plan:   Principal Problem:   Acute hypoxemic respiratory failure (HCC) Active Problems:   Tobacco abuse   Morbid obesity (HCC)   COPD exacerbation (HCC)   Type 2 diabetes mellitus with complication, without long-term current use of insulin (HCC)   Acute respiratory failure (HCC)   Pressure injury of skin   Toxic metabolic encephalopathy   Anisocoria  Acute on chronic hypoxic and hypercapnic respiratory failure: Likely multifactorial due to COPD exacerbation, OSA, Acute on Chronic diastolic heart failure, rhinovirus infection. Rhinovirus infection > resolved.  Patient successfully extubated and placed on HFNC , currently on 3L. Wean from supplemental oxygen as tolerated.  No respiratory distress. He remains on 3 L of supplemental oxygen.   Acute on chronic diastolic heart failure: Pulmonary hypertension Echo shows LVEF 60 to 123456, grade 2 diastolic dysfunction, pulmonary hypertension. Lasix was held due to hypernatremia. Continue Lasix 40 mg daily. Appears euvolemic on exam.  Intake output charting not accurate.   Intake/Output Summary (Last 24 hours) at 04/06/2022 1223 Last data filed at 04/06/2022 1103 Gross per 24 hour  Intake --  Output 4650 ml  Net -4650 ml    Polycythemia due to chronic hypoxia: H/H normalized.   Type 2 diabetes with morbid obesity > New  diagnosis Hb A1c 6.5.  was not on insulin PTA. Continue Metformin 850 mg bid . Continue regular insulin sliding scale.    Urinary retention: > Resolved. Foley removed on 03/18/22, failed voiding trial needing I/O cath re-inserted Foley on 9/13 Continue Flomax.  Patient successfully voided. Urinary retention resolved.   Intermittent confusion: > Improved. Patient continues to have intermittent confusion with agitation likely secondary to hospital delirium. Seroquel is kept on hold until mentation is stable. Patient seems improved. Seroquel resumed.   Morbid obesity BMI 48: Diet and exercise discussed in detail. Estimated body mass index is 45.35 kg/m as calculated from the following:   Height as of this encounter: 5\' 8"  (1.727 m).   Weight as of this encounter: 135.3 kg.    Obstructive sleep apnea: Continue BiPAP at night. Needs outpatient sleep studies.   Bilateral leg and foot pain: He complains of pain in both feet, and tender to palpation on the bottom of both feet. He completed short course of steroids. Left ankle xray shows changes of OA Continue diclofenac gel Increase Gabapentin given c/o numbness. Continue Percocet 5 mg every 6 hours.  UTI: UA consistent with UTI, LE: Large,  Nitrite- Continue ceftriaxone for 3 days Follow-up urine culture    DVT prophylaxis: Arixtra Code Status: Full code Family Communication: No family at bedside Disposition Plan:    Status is: Inpatient Remains inpatient appropriate because: Admitted for acute on chronic hypoxic and hypercapnic respiratory failure secondary to acute on chronic CHF and COPD exacerbation.  Now medically stable, awaiting SNF placement.  No bed offers yet.  Anticipated discharge to SNF 1 to 2  days.   Consultants:  None  Procedures: None Antimicrobials:  Anti-infectives (From admission, onward)    Start     Dose/Rate Route Frequency Ordered Stop   04/06/22 1000  cefTRIAXone (ROCEPHIN) 1 g in sodium  chloride 0.9 % 100 mL IVPB        1 g 200 mL/hr over 30 Minutes Intravenous Every 24 hours 04/06/22 0807 04/09/22 0959   02/23/22 2300  cefTRIAXone (ROCEPHIN) 1 g in sodium chloride 0.9 % 100 mL IVPB        1 g 200 mL/hr over 30 Minutes Intravenous Every 24 hours 02/23/22 2241 02/27/22 2255   02/23/22 1915  doxycycline (VIBRAMYCIN) 100 mg in sodium chloride 0.9 % 250 mL IVPB        100 mg 125 mL/hr over 120 Minutes Intravenous Every 12 hours 02/23/22 1902 02/27/22 2205      Subjective: Patient was seen and examined at bedside.  Overnight events noted.   Patient reports he still has persistent pain in both legs.   He reports burning urination after catheter was removed.  He is able to urinate.  Objective: Vitals:   04/06/22 0315 04/06/22 0500 04/06/22 0534 04/06/22 0730  BP: (!) 105/57  (!) 107/53 109/70  Pulse: (!) 102  100 95  Resp: 18  18 20   Temp: 98.4 F (36.9 C)  97.7 F (36.5 C) 98.2 F (36.8 C)  TempSrc:      SpO2: 90%  (!) 84% 93%  Weight:  135.3 kg    Height:        Intake/Output Summary (Last 24 hours) at 04/06/2022 1223 Last data filed at 04/06/2022 1103 Gross per 24 hour  Intake --  Output 4650 ml  Net -4650 ml   Filed Weights   04/04/22 0143 04/05/22 0500 04/06/22 0500  Weight: (!) 137.2 kg 135.3 kg 135.3 kg    Examination:  General exam: Appears comfortable, NAD, morbidly obese. Respiratory system: CTA bilaterally, respiratory effort normal, RR 17 Cardiovascular system: S1 & S2 heard, regular rate and rhythm, no murmur. Gastrointestinal system: Abdomen is soft, non tender, non distended, BS+ Central nervous system: Alert and oriented x3. No focal neurological deficits. Extremities: No edema, no cyanosis, no clubbing.  Burning pain in both lower extremities. Skin: No rashes, lesions or ulcers Psychiatry: Judgement and insight appear normal. Mood & affect appropriate.    Data Reviewed: I have personally reviewed following labs and imaging  studies  CBC: Recent Labs  Lab 04/04/22 0540 04/06/22 0621  WBC 7.1 6.2  HGB 15.1 14.4  HCT 49.0 46.2  MCV 91.6 91.7  PLT 118* [id number]   Basic Metabolic Panel: Recent Labs  Lab 04/04/22 0540 04/06/22 0621  NA 138 136  K 4.1 4.1  CL 98 99  CO2 34* 30  GLUCOSE 130* 172*  BUN 21* 20  CREATININE 0.57* 0.62  CALCIUM 8.8* 8.8*  MG 1.7 1.8  PHOS 3.6 3.5   GFR: Estimated Creatinine Clearance: 132.2 mL/min (by C-G formula based on SCr of 0.62 mg/dL). Liver Function Tests: No results for input(s): "AST", "ALT", "ALKPHOS", "BILITOT", "PROT", "ALBUMIN" in the last 168 hours. No results for input(s): "LIPASE", "AMYLASE" in the last 168 hours. No results for input(s): "AMMONIA" in the last 168 hours. Coagulation Profile: No results for input(s): "INR", "PROTIME" in the last 168 hours. Cardiac Enzymes: No results for input(s): "CKTOTAL", "CKMB", "CKMBINDEX", "TROPONINI" in the last 168 hours. BNP (last 3 results) No results for input(s): "PROBNP" in the last 8760 hours. HbA1C:  No results for input(s): "HGBA1C" in the last 72 hours. CBG: Recent Labs  Lab 04/05/22 1214 04/05/22 1749 04/05/22 2140 04/06/22 0728 04/06/22 1147  GLUCAP 187* 196* 215* 152* 247*   Lipid Profile: No results for input(s): "CHOL", "HDL", "LDLCALC", "TRIG", "CHOLHDL", "LDLDIRECT" in the last 72 hours. Thyroid Function Tests: No results for input(s): "TSH", "T4TOTAL", "FREET4", "T3FREE", "THYROIDAB" in the last 72 hours. Anemia Panel: No results for input(s): "VITAMINB12", "FOLATE", "FERRITIN", "TIBC", "IRON", "RETICCTPCT" in the last 72 hours. Sepsis Labs: No results for input(s): "PROCALCITON", "LATICACIDVEN" in the last 168 hours.  No results found for this or any previous visit (from the past 240 hour(s)).   Radiology Studies: No results found.  Scheduled Meds:  aspirin EC  81 mg Oral Daily   Chlorhexidine Gluconate Cloth  6 each Topical Daily   docusate sodium  100 mg Oral BID   feeding  supplement  237 mL Oral TID BM   fondaparinux (ARIXTRA) injection  2.5 mg Subcutaneous Q24H   furosemide  40 mg Oral Daily   gabapentin  200 mg Oral Daily   insulin aspart  0-5 Units Subcutaneous QHS   insulin aspart  0-9 Units Subcutaneous TID WC   metFORMIN  850 mg Oral BID WC   metoprolol tartrate  12.5 mg Oral BID   multivitamin with minerals  1 tablet Oral Daily   polyethylene glycol  17 g Oral Daily   tamsulosin  0.4 mg Oral Daily   Continuous Infusions:  cefTRIAXone (ROCEPHIN)  IV 1 g (04/06/22 1145)     LOS: 42 days    Time spent: 35 mins    Emberley Kral, MD [facility]   If 7PM-7AM, please contact night-coverage

## 2022-04-06 NOTE — Plan of Care (Signed)
Pt alert and oriented x 4. Can sit on edge of bed without assist. Needs 2 assist when transferring due to weakness. Meds whole. Pt has received 2 doses of percocet this shift. Pt complaint of pain in chest and tightness approx 0300. Vitals obtained and stable. Oxygen low and put back on 2L/[place] sating 90% on 2L.  Pt began having reflux, gas. Pt reported pain and tightness decreased. Maalox given and was effective within 5-10 min Problem: Education: Goal: Knowledge of General Education information will improve Description: Including pain rating scale, medication(s)/side effects and non-pharmacologic comfort measures Outcome: Progressing   Problem: Health Behavior/Discharge Planning: Goal: Ability to manage health-related needs will improve Outcome: Progressing   Problem: Clinical Measurements: Goal: Ability to maintain clinical measurements within normal limits will improve Outcome: Progressing Goal: Will remain free from infection Outcome: Progressing Goal: Diagnostic test results will improve Outcome: Progressing Goal: Respiratory complications will improve Outcome: Progressing Goal: Cardiovascular complication will be avoided Outcome: Progressing   Problem: Activity: Goal: Risk for activity intolerance will decrease Outcome: Progressing   Problem: Nutrition: Goal: Adequate nutrition will be maintained Outcome: Progressing   Problem: Coping: Goal: Level of anxiety will decrease Outcome: Progressing   Problem: Elimination: Goal: Will not experience complications related to bowel motility Outcome: Progressing Goal: Will not experience complications related to urinary retention Outcome: Progressing   Problem: Pain Managment: Goal: General experience of comfort will improve Outcome: Progressing   Problem: Safety: Goal: Ability to remain free from injury will improve Outcome: Progressing   Problem: Skin Integrity: Goal: Risk for impaired skin integrity will  decrease Outcome: Progressing   Problem: Education: Goal: Ability to describe self-care measures that may prevent or decrease complications (Diabetes Survival Skills Education) will improve Outcome: Progressing   Problem: Coping: Goal: Ability to adjust to condition or change in health will improve Outcome: Progressing   Problem: Fluid Volume: Goal: Ability to maintain a balanced intake and output will improve Outcome: Progressing   Problem: Health Behavior/Discharge Planning: Goal: Ability to identify and utilize available resources and services will improve Outcome: Progressing Goal: Ability to manage health-related needs will improve Outcome: Progressing   Problem: Metabolic: Goal: Ability to maintain appropriate glucose levels will improve Outcome: Progressing   Problem: Nutritional: Goal: Maintenance of adequate nutrition will improve Outcome: Progressing Goal: Progress toward achieving an optimal weight will improve Outcome: Progressing   Problem: Skin Integrity: Goal: Risk for impaired skin integrity will decrease Outcome: Progressing   Problem: Tissue Perfusion: Goal: Adequacy of tissue perfusion will improve Outcome: Progressing

## 2022-04-07 LAB — GLUCOSE, CAPILLARY
Glucose-Capillary: 139 mg/dL — ABNORMAL HIGH (ref 70–99)
Glucose-Capillary: 183 mg/dL — ABNORMAL HIGH (ref 70–99)
Glucose-Capillary: 177 mg/dL — ABNORMAL HIGH (ref 70–99)
Glucose-Capillary: 155 mg/dL — ABNORMAL HIGH (ref 70–99)

## 2022-04-07 NOTE — Progress Notes (Signed)
PROGRESS NOTE    Bryan Mitchell  LMB:[id number] DOB: 01/10/1961 DOA: 02/23/2022  PCP: Pcp, No   Brief Narrative:  This 61 yrs old Male presenting to [facility] ED on 02/23/22 from home in a wheelchair with his sister for evaluation of worsening fatigue, poor appetite and SOB over the past 2 weeks. Per ED documentation, Patient also complained of worsening bilateral leg swelling and pain.  Patient was admitted for acute on chronic hypoxic and hypercapnic respiratory failure likely secondary to COPD exacerbation and rhinovirus infection which is now resolved.  PT and OT recommended SNF,  awaiting placement.  Assessment & Plan:   Principal Problem:   Acute hypoxemic respiratory failure (HCC) Active Problems:   Tobacco abuse   Morbid obesity (HCC)   COPD exacerbation (HCC)   Type 2 diabetes mellitus with complication, without long-term current use of insulin (HCC)   Acute respiratory failure (HCC)   Pressure injury of skin   Toxic metabolic encephalopathy   Anisocoria  Acute on chronic hypoxic and hypercapnic respiratory failure: Likely multifactorial due to COPD exacerbation, OSA, Acute on Chronic diastolic heart failure, rhinovirus infection. Rhinovirus infection > resolved.  Patient successfully extubated and placed on HFNC , currently on 3L. Wean from supplemental oxygen as tolerated.  No respiratory distress. He remains on 3 L of supplemental oxygen.   Acute on chronic diastolic heart failure: Pulmonary hypertension Echo shows LVEF 60 to 65%, grade 2 diastolic dysfunction, pulmonary hypertension. Lasix was held due to hypernatremia.Now resumed Lasix 40 mg daily. Appears euvolemic on exam.  Intake output charting not accurate.   Intake/Output Summary (Last 24 hours) at 04/07/2022 1157 Last data filed at 04/07/2022 0900 Gross per 24 hour  Intake 580 ml  Output 1450 ml  Net -870 ml    Polycythemia due to chronic hypoxia: H/H normalized.   Type 2 diabetes with morbid obesity > New  diagnosis Hb A1c 6.5.  was not on insulin PTA. Continue Metformin 850 mg bid . Continue regular insulin sliding scale.    Urinary retention: > Resolved. Foley removed on 03/18/22, failed voiding trial needing I/O cath re-inserted Foley on 9/13 Continue Flomax.  Patient successfully voided. Urinary retention resolved.   Intermittent confusion: > Improved. Patient continues to have intermittent confusion with agitation likely secondary to hospital delirium. Seroquel is kept on hold until mentation is stable. Patient seems improved. Seroquel resumed.   Morbid obesity BMI 48: Diet and exercise discussed in detail. Estimated body mass index is 45.79 kg/m as calculated from the following:   Height as of this encounter: 5\' 8"  (1.727 m).   Weight as of this encounter: 136.6 kg.    Obstructive sleep apnea: Continue BiPAP at night. Needs outpatient sleep studies.   Bilateral leg and foot pain: He complains of pain in both feet, and tender to palpation on the bottom of both feet. He completed short course of steroids. Left ankle xray shows changes of OA Continue diclofenac gel Continue Gabapentin 200 mg TID ,  given c/o numbness. Continue Percocet 5 mg every 6 hours.  UTI: UA consistent with UTI, LE: Large,  Nitrite- Continue ceftriaxone for 3 days Urine culture not sent.    DVT prophylaxis: Arixtra Code Status: Full code Family Communication: No family at bedside Disposition Plan:    Status is: Inpatient Remains inpatient appropriate because: Admitted for acute on chronic hypoxic and hypercapnic respiratory failure secondary to acute on chronic CHF and COPD exacerbation.  Now medically stable, awaiting SNF placement.  No bed offers yet.  Anticipated discharge to SNF 1 to 2 days.   Consultants:  None  Procedures: None Antimicrobials:  Anti-infectives (From admission, onward)    Start     Dose/Rate Route Frequency Ordered Stop   04/06/22 1000  cefTRIAXone (ROCEPHIN) 1 g  in sodium chloride 0.9 % 100 mL IVPB        1 g 200 mL/hr over 30 Minutes Intravenous Every 24 hours 04/06/22 0807 04/09/22 0959   02/23/22 2300  cefTRIAXone (ROCEPHIN) 1 g in sodium chloride 0.9 % 100 mL IVPB        1 g 200 mL/hr over 30 Minutes Intravenous Every 24 hours 02/23/22 2241 02/27/22 2255   02/23/22 1915  doxycycline (VIBRAMYCIN) 100 mg in sodium chloride 0.9 % 250 mL IVPB        100 mg 125 mL/hr over 120 Minutes Intravenous Every 12 hours 02/23/22 1902 02/27/22 2205      Subjective: Patient was seen and examined at bedside.  Overnight events noted.   Patient reports pain is now reasonably controlled after gabapentin dose is increased.  Objective: Vitals:   04/07/22 0311 04/07/22 0451 04/07/22 0543 04/07/22 0757  BP:  98/63  (!) 112/53  Pulse:  95  97  Resp:  20  17  Temp:  97.9 F (36.6 C)  98 F (36.7 C)  TempSrc:      SpO2:  95% 93% 94%  Weight: (!) 136.6 kg     Height:        Intake/Output Summary (Last 24 hours) at 04/07/2022 1157 Last data filed at 04/07/2022 0900 Gross per 24 hour  Intake 580 ml  Output 1450 ml  Net -870 ml   Filed Weights   04/05/22 0500 04/06/22 0500 04/07/22 0311  Weight: 135.3 kg 135.3 kg (!) 136.6 kg    Examination:  General exam: Appears comfortable, NAD, morbidly obese. Respiratory system: CTA bilaterally, respiratory effort normal, RR 15 Cardiovascular system: S1-S2 heard, regular rate and rhythm, no murmur. Gastrointestinal system: Abdomen is soft, non tender, non distended, BS+ Central nervous system: Alert and oriented x3. No focal neurological deficits. Extremities: Burning pain in both lower extremities, no edema Skin: No rashes, lesions or ulcers Psychiatry: Judgement and insight appear normal. Mood & affect appropriate.    Data Reviewed: I have personally reviewed following labs and imaging studies  CBC: Recent Labs  Lab 04/04/22 0540 04/06/22 0621  WBC 7.1 6.2  HGB 15.1 14.4  HCT 49.0 46.2  MCV 91.6  91.7  PLT 118* [id number]   Basic Metabolic Panel: Recent Labs  Lab 04/04/22 0540 04/06/22 0621  NA 138 136  K 4.1 4.1  CL 98 99  CO2 34* 30  GLUCOSE 130* 172*  BUN 21* 20  CREATININE 0.57* 0.62  CALCIUM 8.8* 8.8*  MG 1.7 1.8  PHOS 3.6 3.5   GFR: Estimated Creatinine Clearance: 132.9 mL/min (by C-G formula based on SCr of 0.62 mg/dL). Liver Function Tests: No results for input(s): "AST", "ALT", "ALKPHOS", "BILITOT", "PROT", "ALBUMIN" in the last 168 hours. No results for input(s): "LIPASE", "AMYLASE" in the last 168 hours. No results for input(s): "AMMONIA" in the last 168 hours. Coagulation Profile: No results for input(s): "INR", "PROTIME" in the last 168 hours. Cardiac Enzymes: No results for input(s): "CKTOTAL", "CKMB", "CKMBINDEX", "TROPONINI" in the last 168 hours. BNP (last 3 results) No results for input(s): "PROBNP" in the last 8760 hours. HbA1C: No results for input(s): "HGBA1C" in the last 72 hours. CBG: Recent Labs  Lab 04/06/22 0728 04/06/22  1147 04/06/22 1617 04/06/22 2141 04/07/22 0759  GLUCAP 152* 247* 139* 195* 139*   Lipid Profile: No results for input(s): "CHOL", "HDL", "LDLCALC", "TRIG", "CHOLHDL", "LDLDIRECT" in the last 72 hours. Thyroid Function Tests: No results for input(s): "TSH", "T4TOTAL", "FREET4", "T3FREE", "THYROIDAB" in the last 72 hours. Anemia Panel: No results for input(s): "VITAMINB12", "FOLATE", "FERRITIN", "TIBC", "IRON", "RETICCTPCT" in the last 72 hours. Sepsis Labs: No results for input(s): "PROCALCITON", "LATICACIDVEN" in the last 168 hours.  No results found for this or any previous visit (from the past 240 hour(s)).   Radiology Studies: No results found.  Scheduled Meds:  aspirin EC  81 mg Oral Daily   Chlorhexidine Gluconate Cloth  6 each Topical Daily   docusate sodium  100 mg Oral BID   feeding supplement  237 mL Oral TID BM   fondaparinux (ARIXTRA) injection  2.5 mg Subcutaneous Q24H   furosemide  40 mg Oral Daily    gabapentin  200 mg Oral TID   insulin aspart  0-5 Units Subcutaneous QHS   insulin aspart  0-9 Units Subcutaneous TID WC   metFORMIN  850 mg Oral BID WC   metoprolol tartrate  12.5 mg Oral BID   multivitamin with minerals  1 tablet Oral Daily   polyethylene glycol  17 g Oral Daily   tamsulosin  0.4 mg Oral Daily   Continuous Infusions:  cefTRIAXone (ROCEPHIN)  IV 1 g (04/07/22 0841)     LOS: 43 days    Time spent: 35 mins    Madoline Bhatt, MD [facility]   If 7PM-7AM, please contact night-coverage

## 2022-04-08 LAB — GLUCOSE, CAPILLARY
Glucose-Capillary: 136 mg/dL — ABNORMAL HIGH (ref 70–99)
Glucose-Capillary: 206 mg/dL — ABNORMAL HIGH (ref 70–99)
Glucose-Capillary: 125 mg/dL — ABNORMAL HIGH (ref 70–99)
Glucose-Capillary: 167 mg/dL — ABNORMAL HIGH (ref 70–99)

## 2022-04-08 NOTE — Progress Notes (Signed)
Physical Therapy Treatment Patient Details Name: Bryan Mitchell MRN: [id number] DOB: 09/24/1960 Today's Date: 04/08/2022   History of Present Illness Patient is a 61 year old male with worsening acute on chronic hypoxic/hypercapnic respiratory failure requiring emergent intubation and mechanical ventilatory support. Suspected COPD exacerbation in the setting of HFpEF and possible pulmonary hypertension. Required intuatbion but is now extubated.    PT Comments    Pt declined all mobility this session secondary to level of recent activity with OT and associated bilateral foot pain.  Pt agreed to below supine therex only but put forth good effort with exercises.  Pt will benefit from PT services in a SNF setting upon discharge to safely address deficits listed in patient problem list for decreased caregiver assistance and eventual return to PLOF.    Recommendations for follow up therapy are one component of a multi-disciplinary discharge planning process, led by the attending physician.  Recommendations may be updated based on patient status, additional functional criteria and insurance authorization.  Follow Up Recommendations  Skilled nursing-short term rehab (<3 hours/day) Can patient physically be transported by private vehicle: No   Assistance Recommended at Discharge Frequent or constant Supervision/Assistance  Patient can return home with the following Help with stairs or ramp for entrance;Assist for transportation;Assistance with feeding;Direct supervision/assist for medications management;Direct supervision/assist for financial management;Assistance with cooking/housework;A lot of help with walking and/or transfers;A lot of help with bathing/dressing/bathroom   Equipment Recommendations  Other (comment) (TBD at next venue of care)    Recommendations for Other Services       Precautions / Restrictions Precautions Precautions: Fall Restrictions Weight Bearing Restrictions: No      Mobility  Bed Mobility               General bed mobility comments: Pt declined mobility this session, agreed to supine therex only    Transfers                        Ambulation/Gait                   Stairs             Wheelchair Mobility    Modified Rankin (Stroke Patients Only)       Balance                                            Cognition Arousal/Alertness: Awake/alert Behavior During Therapy: WFL for tasks assessed/performed, Agitated Overall Cognitive Status: Within Functional Limits for tasks assessed                                          Exercises Total Joint Exercises Ankle Circles/Pumps: AROM, Strengthening, Both, 5 reps, 10 reps Quad Sets: Strengthening, Both, 5 reps, 10 reps Gluteal Sets: Strengthening, Both, 5 reps, 10 reps Heel Slides: Strengthening, Both, 10 reps, 5 reps Hip ABduction/ADduction: Strengthening, Both, 10 reps Other Exercises Other Exercises: HEP education for BLE APs, QS, and GS x 10 each to tolerance every 1-2 hours daily    General Comments        Pertinent Vitals/Pain Pain Assessment Pain Assessment: 0-10 Pain Score: 6  Pain Location: bilateral feet Pain Descriptors / Indicators: Aching, Sore    Home Living  Prior Function            PT Goals (current goals can now be found in the care plan section) Progress towards PT goals: PT to reassess next treatment    Frequency    Min 2X/week      PT Plan Current plan remains appropriate    Co-evaluation              AM-PAC PT "6 Clicks" Mobility   Outcome Measure  Help needed turning from your back to your side while in a flat bed without using bedrails?: None Help needed moving from lying on your back to sitting on the side of a flat bed without using bedrails?: A Little Help needed moving to and from a bed to a chair (including a wheelchair)?: A  Little Help needed standing up from a chair using your arms (e.g., wheelchair or bedside chair)?: A Little Help needed to walk in hospital room?: A Lot Help needed climbing 3-5 steps with a railing? : Total 6 Click Score: 16    End of Session Equipment Utilized During Treatment: Oxygen Activity Tolerance: Patient limited by pain Patient left: in bed;with call bell/phone within reach;with bed alarm set Nurse Communication: Mobility status PT Visit Diagnosis: Unsteadiness on feet (R26.81);Muscle weakness (generalized) (M62.81);Pain Pain - Right/Left:  (bilateral) Pain - part of body: Ankle and joints of foot     Time: 1417-1430 PT Time Calculation (min) (ACUTE ONLY): 13 min  Charges:  $Therapeutic Exercise: 8-22 mins                    D. Scott Lorely Bubb PT, DPT 04/08/22, 3:30 PM

## 2022-04-08 NOTE — TOC Progression Note (Signed)
Transition of Care [facility]) - Progression Note    Patient Details  Name: Bryan Mitchell MRN: [id number] Date of Birth: September 06, 1960  Transition of [facility]) CM/SW [place], LCSW Phone Number: 04/08/2022, 12:12 PM  Clinical Narrative: Left voicemail for [place] with [facility] to see if there were any updates on potential LOG bed.    Expected Discharge Plan: [place] Barriers to Discharge: Continued Medical Work up  Expected Discharge Plan and Services Expected Discharge Plan: [place]   Discharge Planning Services: CM Consult Post Acute Care Choice: [place] Living arrangements for the past 2 months: Single Family Home                                       Social Determinants of Health (SDOH) Interventions    Readmission Risk Interventions     No data to display

## 2022-04-08 NOTE — Progress Notes (Signed)
Occupational Therapy Re-Evaluation  Patient Details Name: Bryan Mitchell MRN: [id number] DOB: 12-Apr-1961 Today's Date: 04/08/2022   History of present illness Patient is a 61 year old male with worsening acute on chronic hypoxic/hypercapnic respiratory failure requiring emergent intubation and mechanical ventilatory support. Suspected COPD exacerbation in the setting of HFpEF and possible pulmonary hypertension. Required intuatbion but is now extubated.   OT comments  Mr Hanak was seen for OT re-evaluation on this date, goals updated to reflect progress this session. Upon arrival to room pt seated on BSC with NT. MAX A pericare in standing, BUE support with fair static balance. MIN A don B shoes, MOD I don gown seated EOB. SBA + RW for BSC t/f and simulated toilet t/f. Pt tolerated 20 ft x2 functional mobility, ~200 ft pushing/pulling self in recliner with BLE. SpO2 87% on RA at rest, 83% on RA during mobility (pt elects to remove despite low O2), SpO2 90% on 3L [place] during mobility. Educated on importance of O2 use and OOB for strengthening. Pt continues to be limited by B foot pain L>R, reports improved with use of epsom salt bath. RN notified pain 8/10. Pt making good progress toward goals, will continue to follow POC. Discharge recommendation remains appropriate.     Recommendations for follow up therapy are one component of a multi-disciplinary discharge planning process, led by the attending physician.  Recommendations may be updated based on patient status, additional functional criteria and insurance authorization.    Follow Up Recommendations  Skilled nursing-short term rehab (<3 hours/day)    Assistance Recommended at Discharge Frequent or constant Supervision/Assistance  Patient can return home with the following  A lot of help with walking and/or transfers;A lot of help with bathing/dressing/bathroom;Help with stairs or ramp for entrance   Equipment Recommendations  BSC/3in1     Recommendations for Other Services      Precautions / Restrictions Precautions Precautions: Fall Restrictions Weight Bearing Restrictions: No       Mobility Bed Mobility Overal bed mobility: Modified Independent                  Transfers Overall transfer level: Needs assistance Equipment used: Rolling walker (2 wheels) Transfers: Bed to chair/wheelchair/BSC, Sit to/from Stand Sit to Stand: Min guard Stand pivot transfers: Min guard               Balance Overall balance assessment: Needs assistance Sitting-balance support: Feet supported Sitting balance-Leahy Scale: Good     Standing balance support: Bilateral upper extremity supported Standing balance-Leahy Scale: Fair                             ADL either performed or assessed with clinical judgement   ADL Overall ADL's : Needs assistance/impaired                                       General ADL Comments: MIN A don B shoes seated EOB. MOD I don gown. SBA + RW for simulated toilet t/f      Cognition Arousal/Alertness: Awake/alert Behavior During Therapy: WFL for tasks assessed/performed, Agitated Overall Cognitive Status: Within Functional Limits for tasks assessed                                 General Comments: poor insight  into O2 needs        Exercises Other Exercises Other Exercises: pt tolerated 20 ft x2 functional mobility, ~200 ft pushing/pulling self in recliner       General Comments SpO2 87% on RA at rest, 83% during mobility (pt elects to remove despite low O2). SpO2 90% on 3L [place] during mobility.     Pertinent Vitals/ Pain       Pain Assessment Pain Assessment: 0-10 Pain Score: 8  Pain Location: L ankle and toes Pain Descriptors / Indicators: Grimacing, Stabbing Pain Intervention(s): Limited activity within patient's tolerance, Repositioned, Utilized relaxation techniques, Heat applied, Patient requesting pain meds-RN  notified   Frequency  Min 2X/week        Progress Toward Goals  OT Goals(current goals can now be found in the care plan section)  Progress towards OT goals: Goals met and updated - see care plan;Progressing toward goals  Acute Rehab OT Goals Patient Stated Goal: to improve pain OT Goal Formulation: With patient Time For Goal Achievement: 04/22/22 Potential to Achieve Goals: Good ADL Goals Pt Will Perform Grooming: with modified independence;standing Pt Will Perform Upper Body Dressing: with modified independence;standing Pt Will Transfer to Toilet: with modified independence;ambulating;regular height toilet Pt/caregiver will Perform Home Exercise Program: Increased ROM;Increased strength;Both right and left upper extremity  Plan Discharge plan remains appropriate;Frequency remains appropriate    Co-evaluation                 AM-PAC OT "6 Clicks" Daily Activity     Outcome Measure   Help from another person eating meals?: None Help from another person taking care of personal grooming?: A Little Help from another person toileting, which includes using toliet, bedpan, or urinal?: A Lot Help from another person bathing (including washing, rinsing, drying)?: A Little Help from another person to put on and taking off regular upper body clothing?: A Little Help from another person to put on and taking off regular lower body clothing?: A Little 6 Click Score: 18    End of Session Equipment Utilized During Treatment: Rolling walker (2 wheels)  OT Visit Diagnosis: Other abnormalities of gait and mobility (R26.89);Muscle weakness (generalized) (M62.81)   Activity Tolerance Patient tolerated treatment well   Patient Left in bed;with call bell/phone within reach;with bed alarm set   Nurse Communication Patient requests pain meds        Time: 1015-1107 OT Time Calculation (min): 52 min  Charges: OT General Charges $OT Visit: 1 Visit OT Evaluation $OT Re-eval: 1  Re-eval OT Treatments $Self Care/Home Management : 23-37 mins $Therapeutic Activity: 8-22 mins  Dessie Coma, M.S. OTR/L  04/08/22, 2:10 PM  ascom [phone]

## 2022-04-08 NOTE — Progress Notes (Signed)
PROGRESS NOTE    Bryan Mitchell  XIP:[id number] DOB: 1961/06/13 DOA: 02/23/2022  PCP: Pcp, No   Brief Narrative:  This 61 yrs old Male presenting to [facility] ED on 02/23/22 from home in a wheelchair with his sister for evaluation of worsening fatigue, poor appetite and SOB over the past 2 weeks. Per ED documentation, Patient also complained of worsening bilateral leg swelling and pain.  Patient was admitted for acute on chronic hypoxic and hypercapnic respiratory failure likely secondary to COPD exacerbation and rhinovirus infection which is now resolved.  PT and OT recommended SNF,  awaiting placement.  Assessment & Plan:   Principal Problem:   Acute hypoxemic respiratory failure (HCC) Active Problems:   Tobacco abuse   Morbid obesity (HCC)   COPD exacerbation (HCC)   Type 2 diabetes mellitus with complication, without long-term current use of insulin (HCC)   Acute respiratory failure (HCC)   Pressure injury of skin   Toxic metabolic encephalopathy   Anisocoria  Acute on chronic hypoxic and hypercapnic respiratory failure: Likely multifactorial due to COPD exacerbation, OSA, Acute on Chronic diastolic heart failure, rhinovirus infection. Rhinovirus infection > resolved.  Patient successfully extubated and placed on HFNC , currently on 3L. Wean from supplemental oxygen as tolerated.  No respiratory distress. He remains on 3 L of supplemental oxygen.   Acute on chronic diastolic heart failure: Pulmonary hypertension Echo shows LVEF 60 to 67%, grade 2 diastolic dysfunction, pulmonary hypertension. Lasix was held due to hypernatremia. Now resumed Lasix 40 mg daily. Appears euvolemic on exam.  Intake output charting not accurate.   Intake/Output Summary (Last 24 hours) at 04/08/2022 1134 Last data filed at 04/08/2022 1039 Gross per 24 hour  Intake 1297 ml  Output 1325 ml  Net -28 ml    Polycythemia due to chronic hypoxia: H/H normalized.   Type 2 diabetes with morbid obesity >  New diagnosis Hb A1c 6.5.  was not on insulin PTA. Continue Metformin 850 mg bid . Continue regular insulin sliding scale.    Urinary retention: > Resolved. Foley removed on 03/18/22, failed voiding trial needing I/O cath re-inserted Foley on 9/13 Continue Flomax.  Patient successfully voided. Urinary retention resolved.   Intermittent confusion: > Improved. Patient continues to have intermittent confusion with agitation likely secondary to hospital delirium. Seroquel is kept on hold until mentation is stable. Patient seems improved. Seroquel resumed.   Morbid obesity BMI 48: Diet and exercise discussed in detail. Estimated body mass index is 45.82 kg/m as calculated from the following:   Height as of this encounter: 5\' 8"  (1.727 m).   Weight as of this encounter: 136.7 kg.    Obstructive sleep apnea: Continue BiPAP at night. Needs outpatient sleep studies.   Bilateral leg and foot pain: He complains of pain in both feet, and tender to palpation on the bottom of both feet. He completed short course of steroids. Left ankle xray shows changes of OA Continue diclofenac gel Continue Gabapentin 200 mg TID ,  given c/o numbness. Continue Percocet 5 mg every 6 hours.  UTI: UA consistent with UTI, LE: Large,  Nitrite- Continue ceftriaxone for 3 days Urine culture not sent.   DVT prophylaxis: Arixtra Code Status: Full code Family Communication: No family at bedside. Disposition Plan:   Status is: Inpatient Remains inpatient appropriate because: Admitted for acute on chronic hypoxic and hypercapnic respiratory failure secondary to acute on chronic CHF and COPD exacerbation.  Now medically stable, awaiting SNF placement.  No bed offers yet.  Anticipated  discharge to SNF 1 to 2 days.   Consultants:  None  Procedures: None Antimicrobials:  Anti-infectives (From admission, onward)    Start     Dose/Rate Route Frequency Ordered Stop   04/06/22 1000  cefTRIAXone (ROCEPHIN) 1  g in sodium chloride 0.9 % 100 mL IVPB        1 g 200 mL/hr over 30 Minutes Intravenous Every 24 hours 04/06/22 0807 04/08/22 1052   02/23/22 2300  cefTRIAXone (ROCEPHIN) 1 g in sodium chloride 0.9 % 100 mL IVPB        1 g 200 mL/hr over 30 Minutes Intravenous Every 24 hours 02/23/22 2241 02/27/22 2255   02/23/22 1915  doxycycline (VIBRAMYCIN) 100 mg in sodium chloride 0.9 % 250 mL IVPB        100 mg 125 mL/hr over 120 Minutes Intravenous Every 12 hours 02/23/22 1902 02/27/22 2205      Subjective: Patient was seen and examined at bedside.  Overnight events noted.   Patient reports pain is much improved after gabapentin is increased. He has not participated in physical therapy in last 2 days.  Objective: Vitals:   04/08/22 0500 04/08/22 0619 04/08/22 0743 04/08/22 0940  BP:  (!) 103/41 (!) 93/48 125/68  Pulse:  92 93 97  Resp:  16 16   Temp:  98.8 F (37.1 C) 98.2 F (36.8 C)   TempSrc:      SpO2:  93% 95%   Weight: (!) 136.7 kg     Height:        Intake/Output Summary (Last 24 hours) at 04/08/2022 1134 Last data filed at 04/08/2022 1039 Gross per 24 hour  Intake 1297 ml  Output 1325 ml  Net -28 ml   Filed Weights   04/06/22 0500 04/07/22 0311 04/08/22 0500  Weight: 135.3 kg (!) 136.6 kg (!) 136.7 kg   Examination:  General exam: Appears comfortable, morbidly obese, NAD. Respiratory system: CTA bilaterally, respiratory effort normal, RR 16. Cardiovascular system: S1-S2 heard, regular rate and rhythm, no murmur. Gastrointestinal system: Abdomen is soft, non tender, non distended, BS+ Central nervous system: Alert and oriented x3. No focal neurological deficits. Extremities: Burning pain in both lower extremities, no edema, no clubbing, no cyanosis. Skin: No rashes, lesions or ulcers Psychiatry: Judgement and insight appear normal. Mood & affect appropriate.    Data Reviewed: I have personally reviewed following labs and imaging studies  CBC: Recent Labs  Lab  04/04/22 0540 04/06/22 0621  WBC 7.1 6.2  HGB 15.1 14.4  HCT 49.0 46.2  MCV 91.6 91.7  PLT 118* [id number]   Basic Metabolic Panel: Recent Labs  Lab 04/04/22 0540 04/06/22 0621  NA 138 136  K 4.1 4.1  CL 98 99  CO2 34* 30  GLUCOSE 130* 172*  BUN 21* 20  CREATININE 0.57* 0.62  CALCIUM 8.8* 8.8*  MG 1.7 1.8  PHOS 3.6 3.5   GFR: Estimated Creatinine Clearance: 132.9 mL/min (by C-G formula based on SCr of 0.62 mg/dL). Liver Function Tests: No results for input(s): "AST", "ALT", "ALKPHOS", "BILITOT", "PROT", "ALBUMIN" in the last 168 hours. No results for input(s): "LIPASE", "AMYLASE" in the last 168 hours. No results for input(s): "AMMONIA" in the last 168 hours. Coagulation Profile: No results for input(s): "INR", "PROTIME" in the last 168 hours. Cardiac Enzymes: No results for input(s): "CKTOTAL", "CKMB", "CKMBINDEX", "TROPONINI" in the last 168 hours. BNP (last 3 results) No results for input(s): "PROBNP" in the last 8760 hours. HbA1C: No results for input(s): "HGBA1C"  in the last 72 hours. CBG: Recent Labs  Lab 04/07/22 0759 04/07/22 1216 04/07/22 1606 04/07/22 2141 04/08/22 0801  GLUCAP 139* 183* 177* 155* 136*   Lipid Profile: No results for input(s): "CHOL", "HDL", "LDLCALC", "TRIG", "CHOLHDL", "LDLDIRECT" in the last 72 hours. Thyroid Function Tests: No results for input(s): "TSH", "T4TOTAL", "FREET4", "T3FREE", "THYROIDAB" in the last 72 hours. Anemia Panel: No results for input(s): "VITAMINB12", "FOLATE", "FERRITIN", "TIBC", "IRON", "RETICCTPCT" in the last 72 hours. Sepsis Labs: No results for input(s): "PROCALCITON", "LATICACIDVEN" in the last 168 hours.  No results found for this or any previous visit (from the past 240 hour(s)).   Radiology Studies: No results found.  Scheduled Meds:  aspirin EC  81 mg Oral Daily   Chlorhexidine Gluconate Cloth  6 each Topical Daily   docusate sodium  100 mg Oral BID   feeding supplement  237 mL Oral TID BM    fondaparinux (ARIXTRA) injection  2.5 mg Subcutaneous Q24H   furosemide  40 mg Oral Daily   gabapentin  200 mg Oral TID   insulin aspart  0-5 Units Subcutaneous QHS   insulin aspart  0-9 Units Subcutaneous TID WC   metFORMIN  850 mg Oral BID WC   metoprolol tartrate  12.5 mg Oral BID   multivitamin with minerals  1 tablet Oral Daily   polyethylene glycol  17 g Oral Daily   tamsulosin  0.4 mg Oral Daily   Continuous Infusions:   LOS: 44 days    Time spent: 35 mins  Leonte Horrigan, MD [facility]   If 7PM-7AM, please contact night-coverage

## 2022-04-09 LAB — GLUCOSE, CAPILLARY
Glucose-Capillary: 146 mg/dL — ABNORMAL HIGH (ref 70–99)
Glucose-Capillary: 197 mg/dL — ABNORMAL HIGH (ref 70–99)
Glucose-Capillary: 167 mg/dL — ABNORMAL HIGH (ref 70–99)
Glucose-Capillary: 177 mg/dL — ABNORMAL HIGH (ref 70–99)

## 2022-04-09 NOTE — Progress Notes (Signed)
PROGRESS NOTE    Bryan Mitchell  HWE:[id number] DOB: 1960-10-15 DOA: 02/23/2022  PCP: Pcp, No   Brief Narrative:  This 61 yrs old Male presenting to [facility] ED on 02/23/22 from home in a wheelchair with his sister for evaluation of worsening fatigue, poor appetite and SOB over the past 2 weeks. Per ED documentation, Patient also complained of worsening bilateral leg swelling and pain.  Patient was admitted for acute on chronic hypoxic and hypercapnic respiratory failure likely secondary to COPD exacerbation and rhinovirus infection which is now resolved.  PT and OT recommended SNF,  awaiting placement.  Assessment & Plan:   Principal Problem:   Acute hypoxemic respiratory failure (HCC) Active Problems:   Tobacco abuse   Morbid obesity (HCC)   COPD exacerbation (HCC)   Type 2 diabetes mellitus with complication, without long-term current use of insulin (HCC)   Acute respiratory failure (HCC)   Pressure injury of skin   Toxic metabolic encephalopathy   Anisocoria  Acute on chronic hypoxic and hypercapnic respiratory failure: Likely multifactorial due to COPD exacerbation, OSA, Acute on Chronic diastolic heart failure, rhinovirus infection. Rhinovirus infection > resolved.  Patient successfully extubated and placed on HFNC , currently on 3L. Wean from supplemental oxygen as tolerated.  No respiratory distress. He remains on 3 L of supplemental oxygen.   Acute on chronic diastolic heart failure: Pulmonary hypertension Echo shows LVEF 60 to 89%, grade 2 diastolic dysfunction, pulmonary hypertension. Lasix was held due to hypernatremia. Now resumed Lasix 40 mg daily. Appears euvolemic on exam.  Intake output charting not accurate.   Intake/Output Summary (Last 24 hours) at 04/09/2022 1454 Last data filed at 04/09/2022 0900 Gross per 24 hour  Intake 960 ml  Output 400 ml  Net 560 ml    Polycythemia due to chronic hypoxia: H/H normalized.   Type 2 diabetes with morbid obesity > New  diagnosis Hb A1c 6.5.  was not on insulin PTA. Continue Metformin 850 mg bid . Continue regular insulin sliding scale.    Urinary retention: > Resolved. Foley removed on 03/18/22, failed voiding trial needing I/O cath re-inserted Foley on 9/13 Continue Flomax.  Patient successfully voided. Urinary retention resolved.   Intermittent confusion: > Improved. Patient continues to have intermittent confusion with agitation likely secondary to hospital delirium. Seroquel is kept on hold until mentation is stable. Patient seems improved.   Morbid obesity BMI 48: Diet and exercise discussed in detail. Estimated body mass index is 45.82 kg/m as calculated from the following:   Height as of this encounter: 5\' 8"  (1.727 m).   Weight as of this encounter: 136.7 kg.    Obstructive sleep apnea: Continue BiPAP at night. Needs outpatient sleep studies.   Bilateral leg and foot pain: He completed short course of steroids. Left ankle xray shows changes of OA Continue diclofenac gel Continue Gabapentin 200 mg TID ,  given c/o numbness. Continue Percocet 5 mg every 6 hours.  UTI: UA consistent with UTI, LE: Large,  Nitrite- Completed ceftriaxone for 3 days.   DVT prophylaxis: Arixtra Code Status: Full code Family Communication: No family at bedside. Disposition Plan:   Status is: Inpatient Remains inpatient appropriate because: Admitted for acute on chronic hypoxic and hypercapnic respiratory failure secondary to acute on chronic CHF and COPD exacerbation.  Now medically stable, awaiting SNF placement.  No bed offers yet.  Anticipated discharge to SNF 1 to 2 days.   Consultants:  None  Procedures: None Antimicrobials:  Anti-infectives (From admission, onward)  Start     Dose/Rate Route Frequency Ordered Stop   04/06/22 1000  cefTRIAXone (ROCEPHIN) 1 g in sodium chloride 0.9 % 100 mL IVPB        1 g 200 mL/hr over 30 Minutes Intravenous Every 24 hours 04/06/22 0807 04/08/22 1052    02/23/22 2300  cefTRIAXone (ROCEPHIN) 1 g in sodium chloride 0.9 % 100 mL IVPB        1 g 200 mL/hr over 30 Minutes Intravenous Every 24 hours 02/23/22 2241 02/27/22 2255   02/23/22 1915  doxycycline (VIBRAMYCIN) 100 mg in sodium chloride 0.9 % 250 mL IVPB        100 mg 125 mL/hr over 120 Minutes Intravenous Every 12 hours 02/23/22 1902 02/27/22 2205      Subjective: Patient was seen and examined at bedside.  Overnight events noted.   Patient reports pain is much improved after gabapentin was increased. He has participated in physical therapy in last 2 days.  Objective: Vitals:   04/08/22 1628 04/08/22 1958 04/09/22 0445 04/09/22 0744  BP: 121/70 131/74 (!) 111/56 119/71  Pulse: 100 (!) 101 85 92  Resp: 17 18 17 20   Temp: 98.2 F (36.8 C) 98.1 F (36.7 C) 98 F (36.7 C) 98.1 F (36.7 C)  TempSrc:  Oral    SpO2: 92% 94% 94% 93%  Weight:      Height:        Intake/Output Summary (Last 24 hours) at 04/09/2022 1454 Last data filed at 04/09/2022 0900 Gross per 24 hour  Intake 960 ml  Output 400 ml  Net 560 ml   Filed Weights   04/06/22 0500 04/07/22 0311 04/08/22 0500  Weight: 135.3 kg (!) 136.6 kg (!) 136.7 kg   Examination:  General exam: Appears comfortable, morbidly obese, NAD. Respiratory system: CTA bilaterally, respiratory effort normal, RR 16. Cardiovascular system: S1-S2 heard, regular rate and rhythm, no murmur. Gastrointestinal system: Abdomen is soft, non tender, non distended, BS+ Central nervous system: Alert and oriented x 3. No focal neurological deficits. Extremities: Burning pain in both lower extremities, no edema, no clubbing, no cyanosis. Skin: No rashes, lesions or ulcers Psychiatry: Judgement and insight appear normal. Mood & affect appropriate.    Data Reviewed: I have personally reviewed following labs and imaging studies  CBC: Recent Labs  Lab 04/04/22 0540 04/06/22 0621  WBC 7.1 6.2  HGB 15.1 14.4  HCT 49.0 46.2  MCV 91.6 91.7   PLT 118* [id number]   Basic Metabolic Panel: Recent Labs  Lab 04/04/22 0540 04/06/22 0621  NA 138 136  K 4.1 4.1  CL 98 99  CO2 34* 30  GLUCOSE 130* 172*  BUN 21* 20  CREATININE 0.57* 0.62  CALCIUM 8.8* 8.8*  MG 1.7 1.8  PHOS 3.6 3.5   GFR: Estimated Creatinine Clearance: 132.9 mL/min (by C-G formula based on SCr of 0.62 mg/dL). Liver Function Tests: No results for input(s): "AST", "ALT", "ALKPHOS", "BILITOT", "PROT", "ALBUMIN" in the last 168 hours. No results for input(s): "LIPASE", "AMYLASE" in the last 168 hours. No results for input(s): "AMMONIA" in the last 168 hours. Coagulation Profile: No results for input(s): "INR", "PROTIME" in the last 168 hours. Cardiac Enzymes: No results for input(s): "CKTOTAL", "CKMB", "CKMBINDEX", "TROPONINI" in the last 168 hours. BNP (last 3 results) No results for input(s): "PROBNP" in the last 8760 hours. HbA1C: No results for input(s): "HGBA1C" in the last 72 hours. CBG: Recent Labs  Lab 04/08/22 1150 04/08/22 1629 04/08/22 1949 04/09/22 0743 04/09/22 1201  GLUCAP 206* 125* 167* 146* 197*   Lipid Profile: No results for input(s): "CHOL", "HDL", "LDLCALC", "TRIG", "CHOLHDL", "LDLDIRECT" in the last 72 hours. Thyroid Function Tests: No results for input(s): "TSH", "T4TOTAL", "FREET4", "T3FREE", "THYROIDAB" in the last 72 hours. Anemia Panel: No results for input(s): "VITAMINB12", "FOLATE", "FERRITIN", "TIBC", "IRON", "RETICCTPCT" in the last 72 hours. Sepsis Labs: No results for input(s): "PROCALCITON", "LATICACIDVEN" in the last 168 hours.  No results found for this or any previous visit (from the past 240 hour(s)).   Radiology Studies: No results found.  Scheduled Meds:  aspirin EC  81 mg Oral Daily   docusate sodium  100 mg Oral BID   feeding supplement  237 mL Oral TID BM   fondaparinux (ARIXTRA) injection  2.5 mg Subcutaneous Q24H   furosemide  40 mg Oral Daily   gabapentin  200 mg Oral TID   insulin aspart  0-5 Units  Subcutaneous QHS   insulin aspart  0-9 Units Subcutaneous TID WC   metFORMIN  850 mg Oral BID WC   metoprolol tartrate  12.5 mg Oral BID   multivitamin with minerals  1 tablet Oral Daily   polyethylene glycol  17 g Oral Daily   tamsulosin  0.4 mg Oral Daily   Continuous Infusions:   LOS: 45 days    Time spent: 35 mins  Meylin Stenzel, MD [facility]   If 7PM-7AM, please contact night-coverage

## 2022-04-09 NOTE — Progress Notes (Signed)
Occupational Therapy Treatment Patient Details Name: Bryan Mitchell MRN: [id number] DOB: 04-03-1961 Today's Date: 04/09/2022   History of present illness Patient is a 61 year old male with worsening acute on chronic hypoxic/hypercapnic respiratory failure requiring emergent intubation and mechanical ventilatory support. Suspected COPD exacerbation in the setting of HFpEF and possible pulmonary hypertension. Required intuatbion but is now extubated.   OT comments  Bryan Mitchell was seen for OT treatment on this date. Upon arrival to room pt receiving pain medicine, agreeable to tx. Pt reports use of epsom salt improves B ankle pain/stiffness - provided warm foot bath. Pt requires MAX A don B socks, SUPERVISION doff seated EOB. CGA + RW sit<>stand and ~20 ft mobility. Pt making good progress toward goals, will continue to follow POC. Discharge recommendation remains appropriate.     Recommendations for follow up therapy are one component of a multi-disciplinary discharge planning process, led by the attending physician.  Recommendations may be updated based on patient status, additional functional criteria and insurance authorization.    Follow Up Recommendations  Skilled nursing-short term rehab (<3 hours/day)    Assistance Recommended at Discharge Frequent or constant Supervision/Assistance  Patient can return home with the following  A lot of help with walking and/or transfers;A lot of help with bathing/dressing/bathroom;Help with stairs or ramp for entrance   Equipment Recommendations  BSC/3in1    Recommendations for Other Services      Precautions / Restrictions Precautions Precautions: Fall Restrictions Weight Bearing Restrictions: No       Mobility Bed Mobility Overal bed mobility: Modified Independent                  Transfers Overall transfer level: Needs assistance Equipment used: Rolling walker (2 wheels)   Sit to Stand: Min guard, From elevated surface                  Balance Overall balance assessment: Needs assistance Sitting-balance support: Feet supported Sitting balance-Leahy Scale: Good     Standing balance support: Bilateral upper extremity supported, During functional activity Standing balance-Leahy Scale: Fair                             ADL either performed or assessed with clinical judgement   ADL Overall ADL's : Needs assistance/impaired                                       General ADL Comments: MAX A don B socks, SUPERVISION doff seated EOB. CGA + RW for simulated toilet t/f ~20 ft      Cognition Arousal/Alertness: Awake/alert Behavior During Therapy: WFL for tasks assessed/performed, Agitated Overall Cognitive Status: Within Functional Limits for tasks assessed                                                General Comments SpO2 84% on RA during mobility, 90% on RA seated with cues for PLB    Pertinent Vitals/ Pain       Pain Assessment Pain Assessment: 0-10 Pain Score: 3  Pain Location: bilateral feet Pain Descriptors / Indicators: Aching, Sore Pain Intervention(s): Limited activity within patient's tolerance, RN gave pain meds during session, Heat applied   Frequency  Min 2X/week  Progress Toward Goals  OT Goals(current goals can now be found in the care plan section)  Progress towards OT goals: Progressing toward goals  Acute Rehab OT Goals Patient Stated Goal: improve pain OT Goal Formulation: With patient Time For Goal Achievement: 04/22/22 Potential to Achieve Goals: Good ADL Goals Pt Will Perform Grooming: with modified independence;standing Pt Will Perform Upper Body Dressing: with modified independence;standing Pt Will Transfer to Toilet: with modified independence;ambulating;regular height toilet Pt/caregiver will Perform Home Exercise Program: Increased ROM;Increased strength;Both right and left upper extremity  Plan Discharge  plan remains appropriate;Frequency remains appropriate    Co-evaluation                 AM-PAC OT "6 Clicks" Daily Activity     Outcome Measure   Help from another person eating meals?: None Help from another person taking care of personal grooming?: A Little Help from another person toileting, which includes using toliet, bedpan, or urinal?: A Lot Help from another person bathing (including washing, rinsing, drying)?: A Little Help from another person to put on and taking off regular upper body clothing?: A Little Help from another person to put on and taking off regular lower body clothing?: A Little 6 Click Score: 18    End of Session Equipment Utilized During Treatment: Rolling walker (2 wheels)  OT Visit Diagnosis: Other abnormalities of gait and mobility (R26.89);Muscle weakness (generalized) (M62.81)   Activity Tolerance Patient tolerated treatment well   Patient Left in bed;with call bell/phone within reach   Nurse Communication          Time: 5102-5852 OT Time Calculation (min): 25 min  Charges: OT General Charges $OT Visit: 1 Visit OT Treatments $Self Care/Home Management : 8-22 mins $Therapeutic Activity: 8-22 mins  Dessie Coma, M.S. OTR/L  04/09/22, 4:26 PM  ascom [phone]

## 2022-04-09 NOTE — TOC Progression Note (Signed)
Transition of Care [facility]) - Progression Note    Patient Details  Name: Mohannad Olivero MRN: [id number] Date of Birth: 06-Nov-1960  Transition of Care [facility]) CM/SW Contact  Laurena Slimmer, RN Phone Number: 04/09/2022, 10:52 AM  Clinical Narrative:    Spoke with Brittney from [place]. LOG will be accepted for patient. Brittney provided MCD rate of $268.21 per day.    Expected Discharge Plan: [place] Barriers to Discharge: Continued Medical Work up  Expected Discharge Plan and Services Expected Discharge Plan: [place]   Discharge Planning Services: CM Consult Post Acute Care Choice: [place] Living arrangements for the past 2 months: Single Family Home                                       Social Determinants of Health (SDOH) Interventions    Readmission Risk Interventions     No data to display

## 2022-04-09 NOTE — Plan of Care (Signed)
  Problem: Education: Goal: Knowledge of General Education information will improve Description: Including pain rating scale, medication(s)/side effects and non-pharmacologic comfort measures Outcome: Progressing   Problem: Health Behavior/Discharge Planning: Goal: Ability to manage health-related needs will improve Outcome: Progressing   Problem: Clinical Measurements: Goal: Ability to maintain clinical measurements within normal limits will improve Outcome: Progressing Goal: Will remain free from infection Outcome: Progressing Goal: Diagnostic test results will improve Outcome: Progressing Goal: Respiratory complications will improve Outcome: Progressing Goal: Cardiovascular complication will be avoided Outcome: Progressing   Problem: Activity: Goal: Risk for activity intolerance will decrease Outcome: Progressing   Problem: Nutrition: Goal: Adequate nutrition will be maintained Outcome: Progressing   Problem: Coping: Goal: Level of anxiety will decrease Outcome: Progressing   Problem: Elimination: Goal: Will not experience complications related to bowel motility Outcome: Progressing Goal: Will not experience complications related to urinary retention Outcome: Progressing   Problem: Pain Managment: Goal: General experience of comfort will improve Outcome: Progressing   Problem: Safety: Goal: Ability to remain free from injury will improve Outcome: Progressing   Problem: Skin Integrity: Goal: Risk for impaired skin integrity will decrease Outcome: Progressing   Problem: Education: Goal: Ability to describe self-care measures that may prevent or decrease complications (Diabetes Survival Skills Education) will improve Outcome: Progressing   Problem: Coping: Goal: Ability to adjust to condition or change in health will improve Outcome: Progressing   Problem: Fluid Volume: Goal: Ability to maintain a balanced intake and output will improve Outcome:  Progressing   Problem: Health Behavior/Discharge Planning: Goal: Ability to identify and utilize available resources and services will improve Outcome: Progressing Goal: Ability to manage health-related needs will improve Outcome: Progressing   Problem: Metabolic: Goal: Ability to maintain appropriate glucose levels will improve Outcome: Progressing   Problem: Nutritional: Goal: Maintenance of adequate nutrition will improve Outcome: Progressing Goal: Progress toward achieving an optimal weight will improve Outcome: Progressing   Problem: Skin Integrity: Goal: Risk for impaired skin integrity will decrease Outcome: Progressing   Problem: Tissue Perfusion: Goal: Adequacy of tissue perfusion will improve Outcome: Progressing   

## 2022-04-10 ENCOUNTER — Inpatient Hospital Stay: Payer: Medicaid Other

## 2022-04-10 LAB — GLUCOSE, CAPILLARY
Glucose-Capillary: 155 mg/dL — ABNORMAL HIGH (ref 70–99)
Glucose-Capillary: 206 mg/dL — ABNORMAL HIGH (ref 70–99)
Glucose-Capillary: 218 mg/dL — ABNORMAL HIGH (ref 70–99)
Glucose-Capillary: 170 mg/dL — ABNORMAL HIGH (ref 70–99)

## 2022-04-10 MED ORDER — FUROSEMIDE 40 MG PO TABS
40.0000 mg | ORAL_TABLET | Freq: Once | ORAL | Status: AC
  Administered 2022-04-10: 40 mg via ORAL
  Filled 2022-04-10: qty 1

## 2022-04-10 NOTE — Progress Notes (Signed)
Physical Therapy Treatment Patient Details Name: Bryan Mitchell MRN: [id number] DOB: 09-16-1960 Today's Date: 04/10/2022   History of Present Illness Patient is a 61 year old male with worsening acute on chronic hypoxic/hypercapnic respiratory failure requiring emergent intubation and mechanical ventilatory support. Suspected COPD exacerbation in the setting of HFpEF and possible pulmonary hypertension. Required intuatbion but is now extubated.    PT Comments    Pt was motivated to participate during the session and put forth good effort throughout.  Pt's SpO2 88-91% on 2LO2/min throughout the session measured frequently pre, during, and post gait.  Pt reported some improvement in bilateral foot pain but still 7/10 pre-medicated.  Pt ambulated 15 feet with reciprocal pattern and several short standing therapeutic rest breaks and complained of significant L knee pain and instability.  Educated pt verbally and visually on step-to sequencing as if LLE was PWB with pt stating improvement in symptoms with that pattern. Pt will benefit from PT services in a SNF setting upon discharge to safely address deficits listed in patient problem list for decreased caregiver assistance and eventual return to PLOF.      Recommendations for follow up therapy are one component of a multi-disciplinary discharge planning process, led by the attending physician.  Recommendations may be updated based on patient status, additional functional criteria and insurance authorization.  Follow Up Recommendations  Skilled nursing-short term rehab (<3 hours/day) Can patient physically be transported by private vehicle: No   Assistance Recommended at Discharge Frequent or constant Supervision/Assistance  Patient can return home with the following Help with stairs or ramp for entrance;Assist for transportation;Assistance with feeding;Direct supervision/assist for medications management;Direct supervision/assist for financial  management;Assistance with cooking/housework;A lot of help with walking and/or transfers;A lot of help with bathing/dressing/bathroom   Equipment Recommendations  Other (comment) (TBD at next venue of care; pt stated nephew found him a new bariatric wheel chair)    Recommendations for Other Services       Precautions / Restrictions Precautions Precautions: Fall Restrictions Weight Bearing Restrictions: No     Mobility  Bed Mobility Overal bed mobility: Modified Independent             General bed mobility comments: Min extra time and effort only with bed mobility tasks    Transfers Overall transfer level: Needs assistance Equipment used: Rolling walker (2 wheels) Transfers: Sit to/from Stand Sit to Stand: Min guard, From elevated surface           General transfer comment: Extra time and effort to come to standing from an elevated surface    Ambulation/Gait Ambulation/Gait assistance: Min guard Gait Distance (Feet): 15 Feet x 2 Assistive device: Rolling walker (2 wheels) Gait Pattern/deviations: Wide base of support, Trunk flexed, Step-to pattern, Step-through pattern, Decreased stance time - left Gait velocity: decreased     General Gait Details: Mod verbal cues for amb closer to the RW and for step-to sequencing to address L knee pain/instability   Stairs             Wheelchair Mobility    Modified Rankin (Stroke Patients Only)       Balance Overall balance assessment: Needs assistance Sitting-balance support: Feet supported Sitting balance-Leahy Scale: Good     Standing balance support: Bilateral upper extremity supported, During functional activity, Reliant on assistive device for balance Standing balance-Leahy Scale: Fair  Cognition Arousal/Alertness: Awake/alert Behavior During Therapy: WFL for tasks assessed/performed Overall Cognitive Status: Within Functional Limits for tasks assessed                                           Exercises      General Comments        Pertinent Vitals/Pain Pain Assessment Pain Assessment: 0-10 Pain Score: 7  Pain Location: bilateral feet Pain Descriptors / Indicators: Aching, Sore Pain Intervention(s): Repositioned, Premedicated before session, Monitored during session    Home Living                          Prior Function            PT Goals (current goals can now be found in the care plan section) Progress towards PT goals: Progressing toward goals    Frequency    Min 2X/week      PT Plan Current plan remains appropriate    Co-evaluation              AM-PAC PT "6 Clicks" Mobility   Outcome Measure  Help needed turning from your back to your side while in a flat bed without using bedrails?: None Help needed moving from lying on your back to sitting on the side of a flat bed without using bedrails?: None Help needed moving to and from a bed to a chair (including a wheelchair)?: A Little Help needed standing up from a chair using your arms (e.g., wheelchair or bedside chair)?: A Little Help needed to walk in hospital room?: A Little Help needed climbing 3-5 steps with a railing? : Total 6 Click Score: 18    End of Session Equipment Utilized During Treatment: Oxygen;Gait belt Activity Tolerance: Patient limited by pain Patient left: in bed;with call bell/phone within reach Nurse Communication: Mobility status;Other (comment) (Pt's bed alarm off) PT Visit Diagnosis: Unsteadiness on feet (R26.81);Muscle weakness (generalized) (M62.81);Pain Pain - part of body: Ankle and joints of foot     Time: 1455-1527 PT Time Calculation (min) (ACUTE ONLY): 32 min  Charges:  $Gait Training: 8-22 mins $Therapeutic Activity: 8-22 mins                    D. Scott Joniah Bednarski PT, DPT 04/10/22, 3:49 PM

## 2022-04-10 NOTE — Plan of Care (Signed)
Problem: Education: Goal: Knowledge of General Education information will improve Description: Including pain rating scale, medication(s)/side effects and non-pharmacologic comfort measures 04/10/2022 2326 by Avon Gully, RN Outcome: Progressing 04/10/2022 2326 by Avon Gully, RN Outcome: Progressing   Problem: Health Behavior/Discharge Planning: Goal: Ability to manage health-related needs will improve 04/10/2022 2326 by Avon Gully, RN Outcome: Progressing 04/10/2022 2326 by Avon Gully, RN Outcome: Progressing   Problem: Clinical Measurements: Goal: Ability to maintain clinical measurements within normal limits will improve 04/10/2022 2326 by Avon Gully, RN Outcome: Progressing 04/10/2022 2326 by Avon Gully, RN Outcome: Progressing Goal: Will remain free from infection 04/10/2022 2326 by Avon Gully, RN Outcome: Progressing 04/10/2022 2326 by Avon Gully, RN Outcome: Progressing Goal: Diagnostic test results will improve 04/10/2022 2326 by Avon Gully, RN Outcome: Progressing 04/10/2022 2326 by Avon Gully, RN Outcome: Progressing Goal: Respiratory complications will improve 04/10/2022 2326 by Avon Gully, RN Outcome: Progressing 04/10/2022 2326 by Avon Gully, RN Outcome: Progressing Goal: Cardiovascular complication will be avoided 04/10/2022 2326 by Avon Gully, RN Outcome: Progressing 04/10/2022 2326 by Avon Gully, RN Outcome: Progressing   Problem: Activity: Goal: Risk for activity intolerance will decrease 04/10/2022 2326 by Avon Gully, RN Outcome: Progressing 04/10/2022 2326 by Avon Gully, RN Outcome: Progressing   Problem: Nutrition: Goal: Adequate nutrition will be maintained 04/10/2022 2326 by Avon Gully, RN Outcome: Progressing 04/10/2022 2326 by Avon Gully, RN Outcome: Progressing   Problem: Coping: Goal: Level of anxiety will decrease 04/10/2022 2326 by Avon Gully, RN Outcome:  Progressing 04/10/2022 2326 by Avon Gully, RN Outcome: Progressing   Problem: Elimination: Goal: Will not experience complications related to bowel motility 04/10/2022 2326 by Avon Gully, RN Outcome: Progressing 04/10/2022 2326 by Avon Gully, RN Outcome: Progressing Goal: Will not experience complications related to urinary retention 04/10/2022 2326 by Avon Gully, RN Outcome: Progressing 04/10/2022 2326 by Avon Gully, RN Outcome: Progressing   Problem: Pain Managment: Goal: General experience of comfort will improve 04/10/2022 2326 by Avon Gully, RN Outcome: Progressing 04/10/2022 2326 by Avon Gully, RN Outcome: Progressing   Problem: Safety: Goal: Ability to remain free from injury will improve 04/10/2022 2326 by Avon Gully, RN Outcome: Progressing 04/10/2022 2326 by Avon Gully, RN Outcome: Progressing   Problem: Skin Integrity: Goal: Risk for impaired skin integrity will decrease 04/10/2022 2326 by Avon Gully, RN Outcome: Progressing 04/10/2022 2326 by Avon Gully, RN Outcome: Progressing   Problem: Education: Goal: Ability to describe self-care measures that may prevent or decrease complications (Diabetes Survival Skills Education) will improve 04/10/2022 2326 by Avon Gully, RN Outcome: Progressing 04/10/2022 2326 by Avon Gully, RN Outcome: Progressing   Problem: Coping: Goal: Ability to adjust to condition or change in health will improve 04/10/2022 2326 by Avon Gully, RN Outcome: Progressing 04/10/2022 2326 by Avon Gully, RN Outcome: Progressing   Problem: Fluid Volume: Goal: Ability to maintain a balanced intake and output will improve 04/10/2022 2326 by Avon Gully, RN Outcome: Progressing 04/10/2022 2326 by Avon Gully, RN Outcome: Progressing   Problem: Health Behavior/Discharge Planning: Goal: Ability to identify and utilize available resources and services will improve 04/10/2022 2326 by Avon Gully, RN Outcome: Progressing 04/10/2022 2326 by Avon Gully, RN Outcome: Progressing Goal: Ability to manage health-related needs will improve 04/10/2022 2326 by Avon Gully, RN Outcome: Progressing 04/10/2022 2326 by Avon Gully, RN Outcome: Progressing   Problem: Metabolic: Goal: Ability to maintain appropriate glucose levels will improve Outcome: Progressing   Problem: Nutritional: Goal: Maintenance of adequate nutrition will improve Outcome: Progressing Goal: Progress toward achieving an  optimal weight will improve Outcome: Progressing   Problem: Skin Integrity: Goal: Risk for impaired skin integrity will decrease Outcome: Progressing   Problem: Tissue Perfusion: Goal: Adequacy of tissue perfusion will improve Outcome: Progressing   Problem: Education: Goal: Ability to describe self-care measures that may prevent or decrease complications (Diabetes Survival Skills Education) will improve Outcome: Progressing Goal: Individualized Educational Video(s) Outcome: Progressing   Problem: Coping: Goal: Ability to adjust to condition or change in health will improve Outcome: Progressing   Problem: Fluid Volume: Goal: Ability to maintain a balanced intake and output will improve Outcome: Progressing   Problem: Health Behavior/Discharge Planning: Goal: Ability to identify and utilize available resources and services will improve Outcome: Progressing Goal: Ability to manage health-related needs will improve Outcome: Progressing   Problem: Metabolic: Goal: Ability to maintain appropriate glucose levels will improve Outcome: Progressing   Problem: Nutritional: Goal: Maintenance of adequate nutrition will improve Outcome: Progressing Goal: Progress toward achieving an optimal weight will improve Outcome: Progressing   Problem: Skin Integrity: Goal: Risk for impaired skin integrity will decrease Outcome: Progressing   Problem: Tissue Perfusion: Goal:  Adequacy of tissue perfusion will improve Outcome: Progressing

## 2022-04-10 NOTE — Plan of Care (Signed)
Problem: Education: Goal: Knowledge of General Education information will improve Description: Including pain rating scale, medication(s)/side effects and non-pharmacologic comfort measures 04/10/2022 1655 by Simonne Martinet, RN Outcome: Progressing 04/10/2022 1655 by Simonne Martinet, RN Outcome: Progressing   Problem: Health Behavior/Discharge Planning: Goal: Ability to manage health-related needs will improve 04/10/2022 1655 by Simonne Martinet, RN Outcome: Progressing 04/10/2022 1655 by Simonne Martinet, RN Outcome: Progressing   Problem: Clinical Measurements: Goal: Ability to maintain clinical measurements within normal limits will improve 04/10/2022 1655 by Simonne Martinet, RN Outcome: Progressing 04/10/2022 1655 by Simonne Martinet, RN Outcome: Progressing Goal: Will remain free from infection 04/10/2022 1655 by Simonne Martinet, RN Outcome: Progressing 04/10/2022 1655 by Simonne Martinet, RN Outcome: Progressing Goal: Diagnostic test results will improve 04/10/2022 1655 by Simonne Martinet, RN Outcome: Progressing 04/10/2022 1655 by Simonne Martinet, RN Outcome: Progressing Goal: Respiratory complications will improve 04/10/2022 1655 by Simonne Martinet, RN Outcome: Progressing 04/10/2022 1655 by Simonne Martinet, RN Outcome: Progressing Goal: Cardiovascular complication will be avoided 04/10/2022 1655 by Simonne Martinet, RN Outcome: Progressing 04/10/2022 1655 by Simonne Martinet, RN Outcome: Progressing   Problem: Activity: Goal: Risk for activity intolerance will decrease 04/10/2022 1655 by Simonne Martinet, RN Outcome: Progressing 04/10/2022 1655 by Simonne Martinet, RN Outcome: Progressing   Problem: Nutrition: Goal: Adequate nutrition will be maintained 04/10/2022 1655 by Simonne Martinet, RN Outcome: Progressing 04/10/2022 1655 by Simonne Martinet, RN Outcome: Progressing   Problem: Coping: Goal: Level of anxiety will decrease 04/10/2022 1655 by Simonne Martinet, RN Outcome:  Progressing 04/10/2022 1655 by Simonne Martinet, RN Outcome: Progressing   Problem: Elimination: Goal: Will not experience complications related to bowel motility 04/10/2022 1655 by Simonne Martinet, RN Outcome: Progressing 04/10/2022 1655 by Simonne Martinet, RN Outcome: Progressing Goal: Will not experience complications related to urinary retention 04/10/2022 1655 by Simonne Martinet, RN Outcome: Progressing 04/10/2022 1655 by Simonne Martinet, RN Outcome: Progressing   Problem: Pain Managment: Goal: General experience of comfort will improve 04/10/2022 1655 by Simonne Martinet, RN Outcome: Progressing 04/10/2022 1655 by Simonne Martinet, RN Outcome: Progressing   Problem: Safety: Goal: Ability to remain free from injury will improve 04/10/2022 1655 by Simonne Martinet, RN Outcome: Progressing 04/10/2022 1655 by Simonne Martinet, RN Outcome: Progressing   Problem: Skin Integrity: Goal: Risk for impaired skin integrity will decrease 04/10/2022 1655 by Simonne Martinet, RN Outcome: Progressing 04/10/2022 1655 by Simonne Martinet, RN Outcome: Progressing   Problem: Health Behavior/Discharge Planning: Goal: Ability to identify and utilize available resources and services will improve 04/10/2022 1655 by Simonne Martinet, RN Outcome: Progressing 04/10/2022 1655 by Simonne Martinet, RN Outcome: Progressing Goal: Ability to manage health-related needs will improve 04/10/2022 1655 by Simonne Martinet, RN Outcome: Progressing 04/10/2022 1655 by Simonne Martinet, RN Outcome: Progressing   Problem: Nutritional: Goal: Progress toward achieving an optimal weight will improve 04/10/2022 1655 by Simonne Martinet, RN Outcome: Progressing 04/10/2022 1655 by Simonne Martinet, RN Outcome: Progressing   Problem: Education: Goal: Ability to describe self-care measures that may prevent or decrease complications (Diabetes Survival Skills Education) will improve Outcome: Progressing Goal: Individualized Educational  Video(s) Outcome: Progressing   Problem: Coping: Goal: Ability to adjust to condition or change in health will improve Outcome: Progressing   Problem: Fluid Volume: Goal: Ability to maintain a balanced intake and output will improve Outcome: Progressing   Problem: Health Behavior/Discharge Planning: Goal: Ability to identify and utilize available resources and services will improve Outcome: Progressing Goal: Ability to manage health-related needs will improve Outcome: Progressing   Problem: Metabolic: Goal: Ability to maintain appropriate glucose  levels will improve Outcome: Progressing   Problem: Nutritional: Goal: Maintenance of adequate nutrition will improve Outcome: Progressing Goal: Progress toward achieving an optimal weight will improve Outcome: Progressing   Problem: Skin Integrity: Goal: Risk for impaired skin integrity will decrease Outcome: Progressing   Problem: Tissue Perfusion: Goal: Adequacy of tissue perfusion will improve Outcome: Progressin

## 2022-04-10 NOTE — Progress Notes (Signed)
24- Pts sister present and at bedside during bedside shift report. Sister inquired from this RN and off-going RN why patient's blood sugar levels had been high. Sister asked why patient was not on a "low carb diet." Explained to patient and sister about diabetic regimen, to include medications, insulin injections, and choice of diet. Pt and sister educated on patient's ability to order healthy food options from cafeteria if desired. Pt and sister verbalized understanding.   1950- This RN into patient's room. No visitors present at bedside. Asked patient if he would like this RN to petition MD to change diet to carb modified diet, to better control blood sugar levels. Patient replied "no, I don't want my diet changed." Pt understanding of diagnosis of pre-diabetes and recommended regimen.

## 2022-04-10 NOTE — Progress Notes (Signed)
PROGRESS NOTE    Bryan Mitchell  AQT:[id number] DOB: 04-27-1961 DOA: 02/23/2022  PCP: Pcp, No   Brief Narrative:  This 61 yrs old Male presenting to [facility] ED on 02/23/22 from home in a wheelchair with his sister for evaluation of worsening fatigue, poor appetite and SOB over the past 2 weeks. Per ED documentation, Patient also complained of worsening bilateral leg swelling and pain.  Patient was admitted for acute on chronic hypoxic and hypercapnic respiratory failure likely secondary to COPD exacerbation and rhinovirus infection which is now resolved.  PT and OT recommended SNF,  awaiting placement.  Assessment & Plan:   Principal Problem:   Acute hypoxemic respiratory failure (HCC) Active Problems:   Tobacco abuse   Morbid obesity (HCC)   COPD exacerbation (HCC)   Type 2 diabetes mellitus with complication, without long-term current use of insulin (HCC)   Acute respiratory failure (HCC)   Pressure injury of skin   Toxic metabolic encephalopathy   Anisocoria  Acute on chronic hypoxic and hypercapnic respiratory failure: Likely multifactorial due to COPD exacerbation, OSA, Acute on Chronic diastolic heart failure, rhinovirus infection. Rhinovirus infection > resolved.  Patient successfully extubated and placed on HFNC , currently on 3L. Wean from supplemental oxygen as tolerated.  No respiratory distress. He remains on 3 L of supplemental oxygen.   Acute on chronic diastolic heart failure: Pulmonary hypertension Echo shows LVEF 60 to 56%, grade 2 diastolic dysfunction, pulmonary hypertension. Lasix was held due to hypernatremia.  Now resumed Lasix 40 mg daily. Appears euvolemic on exam.  Intake output charting not accurate.   Intake/Output Summary (Last 24 hours) at 04/10/2022 1323 Last data filed at 04/10/2022 1011 Gross per 24 hour  Intake 480 ml  Output --  Net 480 ml    Polycythemia due to chronic hypoxia: H/H normalized.   Type 2 diabetes with morbid obesity > New  diagnosis Hb A1c 6.5.  was not on insulin PTA. Continue Metformin 850 mg bid . Continue regular insulin sliding scale.    Urinary retention: > Resolved. Foley removed on 03/18/22, failed voiding trial needing I/O cath re-inserted Foley on 9/13 Continue Flomax.  Patient successfully voided. Urinary retention resolved.   Intermittent confusion: > Improved. Patient continues to have intermittent confusion with agitation likely secondary to hospital delirium. Seroquel is kept on hold until mentation is stable. Patient seems improved.   Morbid obesity BMI 48: Diet and exercise discussed in detail. Estimated body mass index is 47.26 kg/m as calculated from the following:   Height as of this encounter: 5\' 8"  (1.727 m).   Weight as of this encounter: 141 kg.    Obstructive sleep apnea: Continue BiPAP at night. Needs outpatient sleep studies.   Bilateral leg and foot pain: He completed short course of steroids. Left ankle xray shows changes of OA Continue diclofenac gel Continue Gabapentin 200 mg TID ,  given c/o numbness. Continue Percocet 5 mg every 6 hours.  UTI: UA consistent with UTI, LE: Large,  Nitrite- Completed ceftriaxone for 3 days.   DVT prophylaxis: Arixtra Code Status: Full code Family Communication: No family at bedside. Disposition Plan:   Status is: Inpatient  Remains inpatient appropriate because:   Requires SNF/rehab.   Now medically stable, awaiting SNF placement.   TOC following for LOG to place pt at SNF.    Consultants:  None  Procedures: None Antimicrobials:  Anti-infectives (From admission, onward)    Start     Dose/Rate Route Frequency Ordered Stop   04/06/22 1000  cefTRIAXone (ROCEPHIN) 1 g in sodium chloride 0.9 % 100 mL IVPB        1 g 200 mL/hr over 30 Minutes Intravenous Every 24 hours 04/06/22 0807 04/08/22 1052   02/23/22 2300  cefTRIAXone (ROCEPHIN) 1 g in sodium chloride 0.9 % 100 mL IVPB        1 g 200 mL/hr over 30 Minutes  Intravenous Every 24 hours 02/23/22 2241 02/27/22 2255   02/23/22 1915  doxycycline (VIBRAMYCIN) 100 mg in sodium chloride 0.9 % 250 mL IVPB        100 mg 125 mL/hr over 120 Minutes Intravenous Every 12 hours 02/23/22 1902 02/27/22 2205      Subjective: Patient was seen and examined at bedside.   No acute events reported. He says breathing is much better. Asks when he might get out of the hospital to rehab.  No current acute complaints.   Has left foot pain he attributes to swelling, says it's been bothering him for a month.   Objective: Vitals:   04/10/22 0313 04/10/22 0523 04/10/22 0723 04/10/22 1110  BP:  111/62 117/68   Pulse:  90 83   Resp:  18 20   Temp:  97.9 F (36.6 C) 97.6 F (36.4 C)   TempSrc:   Oral   SpO2:  93% 90%   Weight: (!) 414.2 kg   (!) 141 kg  Height:        Intake/Output Summary (Last 24 hours) at 04/10/2022 1323 Last data filed at 04/10/2022 1011 Gross per 24 hour  Intake 480 ml  Output --  Net 480 ml   Filed Weights   04/08/22 0500 04/10/22 0313 04/10/22 1110  Weight: (!) 136.7 kg (!) 414.2 kg (!) 141 kg   Examination:  General exam: awake, alert, no acute distress, obese, chronically ill appearing HEENT: moist mucus membranes, hearing grossly normal  Respiratory system: CTAB diminished bases, no wheezes, rales or rhonchi, normal respiratory effort. Cardiovascular system: normal S1/S2, RRR Gastrointestinal system: soft, NT, ND Central nervous system: A&O x3. no gross focal neurologic deficits, normal speech Extremities: moves all, no cyanosis, mild L pedal edema, normal tone Skin: dry, intact, normal temperature Psychiatry: normal mood, congruent affect, judgement and insight appear normal    Data Reviewed: I have personally reviewed following labs and imaging studies  No new labs today. CBG's overall at goal with occasional high, 197 at lunch yesterday    LOS: 46 days    Time spent: 35 mins  Bryan Slocumb, DO [facility]   If 7PM-7AM, please contact night-coverage

## 2022-04-11 ENCOUNTER — Other Ambulatory Visit: Payer: Self-pay

## 2022-04-11 LAB — GLUCOSE, CAPILLARY
Glucose-Capillary: 126 mg/dL — ABNORMAL HIGH (ref 70–99)
Glucose-Capillary: 262 mg/dL — ABNORMAL HIGH (ref 70–99)
Glucose-Capillary: 183 mg/dL — ABNORMAL HIGH (ref 70–99)
Glucose-Capillary: 210 mg/dL — ABNORMAL HIGH (ref 70–99)

## 2022-04-11 MED ORDER — FUROSEMIDE 40 MG PO TABS
40.0000 mg | ORAL_TABLET | Freq: Two times a day (BID) | ORAL | Status: DC
  Administered 2022-04-11: 40 mg via ORAL
  Filled 2022-04-11: qty 1

## 2022-04-11 MED ORDER — FUROSEMIDE 40 MG PO TABS
60.0000 mg | ORAL_TABLET | Freq: Two times a day (BID) | ORAL | Status: DC
  Administered 2022-04-12: 60 mg via ORAL
  Filled 2022-04-11: qty 1

## 2022-04-11 NOTE — Plan of Care (Signed)
  Problem: Education: Goal: Knowledge of General Education information will improve Description: Including pain rating scale, medication(s)/side effects and non-pharmacologic comfort measures Outcome: Progressing   Problem: Health Behavior/Discharge Planning: Goal: Ability to manage health-related needs will improve Outcome: Progressing   Problem: Clinical Measurements: Goal: Ability to maintain clinical measurements within normal limits will improve Outcome: Progressing Goal: Will remain free from infection Outcome: Progressing Goal: Diagnostic test results will improve Outcome: Progressing Goal: Respiratory complications will improve Outcome: Progressing Goal: Cardiovascular complication will be avoided Outcome: Progressing   Problem: Activity: Goal: Risk for activity intolerance will decrease Outcome: Progressing   Problem: Nutrition: Goal: Adequate nutrition will be maintained Outcome: Progressing   Problem: Coping: Goal: Level of anxiety will decrease Outcome: Progressing   Problem: Elimination: Goal: Will not experience complications related to bowel motility Outcome: Progressing Goal: Will not experience complications related to urinary retention Outcome: Progressing   Problem: Pain Managment: Goal: General experience of comfort will improve Outcome: Progressing   Problem: Safety: Goal: Ability to remain free from injury will improve Outcome: Progressing   Problem: Skin Integrity: Goal: Risk for impaired skin integrity will decrease Outcome: Progressing   Problem: Education: Goal: Ability to describe self-care measures that may prevent or decrease complications (Diabetes Survival Skills Education) will improve Outcome: Progressing   Problem: Coping: Goal: Ability to adjust to condition or change in health will improve Outcome: Progressing   Problem: Fluid Volume: Goal: Ability to maintain a balanced intake and output will improve Outcome:  Progressing   Problem: Health Behavior/Discharge Planning: Goal: Ability to identify and utilize available resources and services will improve Outcome: Progressing Goal: Ability to manage health-related needs will improve Outcome: Progressing   Problem: Metabolic: Goal: Ability to maintain appropriate glucose levels will improve Outcome: Progressing   Problem: Nutritional: Goal: Maintenance of adequate nutrition will improve Outcome: Progressing Goal: Progress toward achieving an optimal weight will improve Outcome: Progressing   Problem: Skin Integrity: Goal: Risk for impaired skin integrity will decrease Outcome: Progressing   Problem: Tissue Perfusion: Goal: Adequacy of tissue perfusion will improve Outcome: Progressing   Problem: Education: Goal: Ability to describe self-care measures that may prevent or decrease complications (Diabetes Survival Skills Education) will improve Outcome: Progressing Goal: Individualized Educational Video(s) Outcome: Progressing   Problem: Coping: Goal: Ability to adjust to condition or change in health will improve Outcome: Progressing   Problem: Fluid Volume: Goal: Ability to maintain a balanced intake and output will improve Outcome: Progressing   Problem: Health Behavior/Discharge Planning: Goal: Ability to identify and utilize available resources and services will improve Outcome: Progressing Goal: Ability to manage health-related needs will improve Outcome: Progressing   Problem: Metabolic: Goal: Ability to maintain appropriate glucose levels will improve Outcome: Progressing   Problem: Nutritional: Goal: Maintenance of adequate nutrition will improve Outcome: Progressing Goal: Progress toward achieving an optimal weight will improve Outcome: Progressing   Problem: Skin Integrity: Goal: Risk for impaired skin integrity will decrease Outcome: Progressing   Problem: Tissue Perfusion: Goal: Adequacy of tissue perfusion  will improve Outcome: Progressing

## 2022-04-11 NOTE — TOC Progression Note (Addendum)
Transition of Care [facility]) - Progression Note    Patient Details  Name: Bryan Mitchell MRN: [id number] Date of Birth: February 27, 1961  [facility]) CM/SW Contact  Laurena Slimmer, RN Phone Number: 04/11/2022, 1:53 PM  Clinical Narrative:    Spoke with patient. Patient is refusing to go to a  SNF. Patient agreeable to [facility], however he is uninsured. Charity referral sent to [facility].   Charity Bari walker and [facility] requested via adapt   Expected Discharge Plan: [place] Barriers to Discharge: Continued Medical Work up  Expected Discharge Plan and Services Expected Discharge Plan: [place]   Discharge Planning Services: CM Consult Post Acute Care Choice: [place] Living arrangements for the past 2 months: Single Family Home                                       Social Determinants of Health (SDOH) Interventions    Readmission Risk Interventions     No data to display

## 2022-04-11 NOTE — Progress Notes (Addendum)
Patient suffers COPD which impairs their ability to perform daily activities like toileting, transferring in the home.  A cane, crutch, or walker will not resolve issue with performing activities of daily living. A wheelchair will allow patient to safely perform daily activities. Patient can safely propel the wheelchair in the home or has a caregiver who can provide assistance. Length of need Lifetime.

## 2022-04-11 NOTE — Plan of Care (Signed)
  Problem: Education: Goal: Knowledge of General Education information will improve Description: Including pain rating scale, medication(s)/side effects and non-pharmacologic comfort measures Outcome: Progressing   Problem: Health Behavior/Discharge Planning: Goal: Ability to manage health-related needs will improve Outcome: Progressing   Problem: Clinical Measurements: Goal: Ability to maintain clinical measurements within normal limits will improve Outcome: Progressing Goal: Will remain free from infection Outcome: Progressing Goal: Diagnostic test results will improve Outcome: Progressing Goal: Respiratory complications will improve Outcome: Progressing Goal: Cardiovascular complication will be avoided Outcome: Progressing   Problem: Activity: Goal: Risk for activity intolerance will decrease Outcome: Progressing   Problem: Nutrition: Goal: Adequate nutrition will be maintained Outcome: Progressing   Problem: Coping: Goal: Level of anxiety will decrease Outcome: Progressing   Problem: Elimination: Goal: Will not experience complications related to bowel motility Outcome: Progressing Goal: Will not experience complications related to urinary retention Outcome: Progressing   Problem: Pain Managment: Goal: General experience of comfort will improve Outcome: Progressing   Problem: Safety: Goal: Ability to remain free from injury will improve Outcome: Progressing   Problem: Skin Integrity: Goal: Risk for impaired skin integrity will decrease Outcome: Progressing   Problem: Education: Goal: Ability to describe self-care measures that may prevent or decrease complications (Diabetes Survival Skills Education) will improve Outcome: Progressing   Problem: Coping: Goal: Ability to adjust to condition or change in health will improve Outcome: Progressing   Problem: Fluid Volume: Goal: Ability to maintain a balanced intake and output will improve Outcome:  Progressing   Problem: Health Behavior/Discharge Planning: Goal: Ability to identify and utilize available resources and services will improve Outcome: Progressing Goal: Ability to manage health-related needs will improve Outcome: Progressing   Problem: Metabolic: Goal: Ability to maintain appropriate glucose levels will improve Outcome: Progressing   Problem: Nutritional: Goal: Maintenance of adequate nutrition will improve Outcome: Progressing Goal: Progress toward achieving an optimal weight will improve Outcome: Progressing   Problem: Skin Integrity: Goal: Risk for impaired skin integrity will decrease Outcome: Progressing   Problem: Tissue Perfusion: Goal: Adequacy of tissue perfusion will improve Outcome: Progressing   Problem: Education: Goal: Ability to describe self-care measures that may prevent or decrease complications (Diabetes Survival Skills Education) will improve Outcome: Progressing Goal: Individualized Educational Video(s) Outcome: Progressing   Problem: Coping: Goal: Ability to adjust to condition or change in health will improve Outcome: Progressing   Problem: Fluid Volume: Goal: Ability to maintain a balanced intake and output will improve Outcome: Progressing   Problem: Health Behavior/Discharge Planning: Goal: Ability to identify and utilize available resources and services will improve Outcome: Progressing Goal: Ability to manage health-related needs will improve Outcome: Progressing   Problem: Metabolic: Goal: Ability to maintain appropriate glucose levels will improve Outcome: Progressing   Problem: Nutritional: Goal: Maintenance of adequate nutrition will improve Outcome: Progressing Goal: Progress toward achieving an optimal weight will improve Outcome: Progressing   Problem: Skin Integrity: Goal: Risk for impaired skin integrity will decrease Outcome: Progressing   Problem: Tissue Perfusion: Goal: Adequacy of tissue perfusion  will improve Outcome: Progressing   

## 2022-04-11 NOTE — Progress Notes (Signed)
PROGRESS NOTE    Bryan Mitchell  [id number] DOB: 1961/02/10 DOA: 02/23/2022  PCP: Pcp, No   Brief Narrative:  This 61 yrs old Male presenting to [facility] ED on 02/23/22 from home in a wheelchair with his sister for evaluation of worsening fatigue, poor appetite and SOB over the past 2 weeks. Per ED documentation, Patient also complained of worsening bilateral leg swelling and pain.  Patient was admitted for acute on chronic hypoxic and hypercapnic respiratory failure likely secondary to COPD exacerbation and rhinovirus infection which is now resolved.    PT and OT recommended SNF, has been awaiting placement.  TOC working on letter of guarantee given pt lacks Primary school teacher.    10/5 - pt now refusing to go to SNF/rehab.  Arranging DME he will need to return home. Adjusting diuretic dose given persistent pedal edema impacting his ambulation.   Assessment & Plan:   Principal Problem:   Acute hypoxemic respiratory failure (HCC) Active Problems:   Tobacco abuse   Morbid obesity (HCC)   COPD exacerbation (HCC)   Type 2 diabetes mellitus with complication, without long-term current use of insulin (HCC)   Acute respiratory failure (HCC)   Pressure injury of skin   Toxic metabolic encephalopathy   Anisocoria  Acute on chronic hypoxic and hypercapnic respiratory failure: Likely multifactorial due to COPD exacerbation, OSA, Acute on Chronic diastolic heart failure, rhinovirus infection. Rhinovirus infection > resolved.  Patient successfully extubated and placed on HFNC , currently on 3L. Wean from supplemental oxygen as tolerated.  No respiratory distress. Remains on 2 L of supplemental oxygen. Will need home O2 at discharge.   Acute on chronic diastolic heart failure: Pulmonary hypertension Echo shows LVEF 60 to 123456, grade 2 diastolic dysfunction, pulmonary hypertension. Lasix was held due to hypernatremia.  Now resumed Lasix 40 mg PO BID - increase to 60 mg BID given worsening pedal  edema. Intake output charting not accurate.    Polycythemia due to chronic hypoxia: H/H normalized.   Type 2 diabetes with morbid obesity > New diagnosis Hb A1c 6.5.  was not on insulin PTA. Continue Metformin 850 mg bid . Continue regular insulin sliding scale.    Urinary retention: > Resolved. Foley removed on 03/18/22, failed voiding trial needing I/O cath re-inserted Foley on 9/13 Continue Flomax.  Patient successfully voided. Urinary retention resolved.   Intermittent confusion: > Improved. Patient continues to have intermittent confusion with agitation likely secondary to hospital delirium. Seroquel is kept on hold until mentation is stable. Patient seems improved.   Morbid obesity BMI 48: Diet and exercise discussed in detail. Estimated body mass index is 46.63 kg/m as calculated from the following:   Height as of this encounter: 5\' 8"  (1.727 m).   Weight as of this encounter: 139.1 kg.    Obstructive sleep apnea: Continue BiPAP at night. Needs outpatient sleep studies.   Bilateral leg and foot pain: He completed short course of steroids. Left ankle xray shows changes of OA Continue diclofenac gel Continue Gabapentin 200 mg TID ,  given c/o numbness. Continue Percocet 5 mg every 6 hours.  UTI: UA consistent with UTI, LE: Large,  Nitrite- Completed ceftriaxone for 3 days.   DVT prophylaxis: Arixtra Code Status: Full code Family Communication: No family at bedside. Disposition Plan:   Status is: Inpatient  Remains inpatient appropriate because:   Requires SNF/rehab, now refusing.   Now medically stable, awaiting required DME for discharge home, likely tomorrow.     Consultants: None  Procedures: None  Subjective: Patient was seen and examined at bedside.   No acute events reported. He says "I'm not going to any nursing home", referring to them being "crooks". He wants to go home because he feels any nursing home will try to acquire his personal  property to pay the bills (lacks insurance).  He says his feet are more swollen, wants Lasix increased.  Says left knee feels like it gives out when standing on it.     Objective: Vitals:   04/11/22 0441 04/11/22 0457 04/11/22 0743 04/11/22 1519  BP: 114/67  103/66 120/76  Pulse: 89  93 89  Resp: 18  16 16   Temp: 98 F (36.7 C)  97.6 F (36.4 C) 98.2 F (36.8 C)  TempSrc:    Oral  SpO2: 92%  90% 90%  Weight:  (!) 139.1 kg    Height:        Intake/Output Summary (Last 24 hours) at 04/11/2022 1744 Last data filed at 04/11/2022 1500 Gross per 24 hour  Intake 717 ml  Output 961 ml  Net -244 ml   Filed Weights   04/10/22 0313 04/10/22 1110 04/11/22 0457  Weight: (!) 414.2 kg (!) 141 kg (!) 139.1 kg   Examination:  General exam: awake, alert, no acute distress, obese, chronically ill appearing HEENT: moist mucus membranes, hearing grossly normal  Respiratory system: CTAB diminished bases, no wheezes, rales or rhonchi, normal respiratory effort. Cardiovascular system: normal S1/S2, RRR Gastrointestinal system: soft, NT, ND Central nervous system: A&O x3. no gross focal neurologic deficits, normal speech Extremities: moves all, no cyanosis, mild L pedal edema, normal tone Skin: dry, intact, normal temperature Psychiatry: normal mood, congruent affect, judgement and insight appear normal    Data Reviewed: I have personally reviewed following labs and imaging studies  No new labs today. CBG's overall at goal with occasional high, 197 at lunch yesterday    LOS: 47 days    Time spent: 35 mins  Ezekiel Slocumb, DO [facility]   If 7PM-7AM, please contact night-coverage

## 2022-04-12 LAB — BASIC METABOLIC PANEL
Anion gap: 4 — ABNORMAL LOW (ref 5–15)
BUN: 24 mg/dL — ABNORMAL HIGH (ref 6–20)
CO2: 39 mmol/L — ABNORMAL HIGH (ref 22–32)
Calcium: 9.2 mg/dL (ref 8.9–10.3)
Chloride: 98 mmol/L (ref 98–111)
Creatinine, Ser: 0.5 mg/dL — ABNORMAL LOW (ref 0.61–1.24)
GFR, Estimated: >60 mL/min (ref >60)
Glucose, Bld: 147 mg/dL — ABNORMAL HIGH (ref 70–99)
Potassium: 4.2 mmol/L (ref 3.5–5.1)
Sodium: 141 mmol/L (ref 135–145)

## 2022-04-12 LAB — GLUCOSE, CAPILLARY
Glucose-Capillary: 152 mg/dL — ABNORMAL HIGH (ref 70–99)
Glucose-Capillary: 174 mg/dL — ABNORMAL HIGH (ref 70–99)
Glucose-Capillary: 165 mg/dL — ABNORMAL HIGH (ref 70–99)

## 2022-04-12 LAB — MAGNESIUM: Magnesium: 2.1 mg/dL (ref 1.7–2.4)

## 2022-04-12 MED ORDER — DOCUSATE SODIUM 100 MG PO CAPS: 100.0000 mg | ORAL_CAPSULE | Freq: Two times a day (BID) | ORAL | 0 refills | Status: DC | PRN

## 2022-04-12 MED ORDER — METFORMIN HCL 850 MG PO TABS
850.0000 mg | ORAL_TABLET | Freq: Two times a day (BID) | ORAL | 1 refills | Status: DC
  Filled 2022-04-19: qty 60, 30d supply, fill #0
  Filled 2022-05-13: qty 60, 30d supply, fill #1

## 2022-04-12 MED ORDER — ALBUTEROL SULFATE HFA 108 (90 BASE) MCG/ACT IN AERS
2.0000 | INHALATION_SPRAY | RESPIRATORY_TRACT | 11 refills | Status: DC | PRN
  Filled 2022-04-19: qty 6.7, 25d supply, fill #0

## 2022-04-12 MED ORDER — FUROSEMIDE 20 MG PO TABS
60.0000 mg | ORAL_TABLET | Freq: Two times a day (BID) | ORAL | 1 refills | Status: DC
  Filled 2022-05-10: qty 90, 15d supply, fill #0

## 2022-04-12 MED ORDER — TAMSULOSIN HCL 0.4 MG PO CAPS
0.4000 mg | ORAL_CAPSULE | Freq: Every day | ORAL | 1 refills | Status: DC
  Filled 2022-05-10: qty 30, 30d supply, fill #0

## 2022-04-12 MED ORDER — DICLOFENAC SODIUM 1 % EX GEL: 2.0000 g | Freq: Three times a day (TID) | CUTANEOUS | Status: DC | PRN

## 2022-04-12 MED ORDER — ENSURE ENLIVE PO LIQD: 237.0000 mL | Freq: Three times a day (TID) | ORAL | 12 refills | Status: DC

## 2022-04-12 MED ORDER — GABAPENTIN 100 MG PO CAPS: 200.0000 mg | ORAL_CAPSULE | Freq: Three times a day (TID) | ORAL | 0 refills | Status: DC

## 2022-04-12 MED ORDER — OXYCODONE-ACETAMINOPHEN 5-325 MG PO TABS: 1.0000 | ORAL_TABLET | Freq: Four times a day (QID) | ORAL | 0 refills | Status: DC | PRN

## 2022-04-12 MED ORDER — ADULT MULTIVITAMIN W/MINERALS CH: 1.0000 | ORAL_TABLET | Freq: Every day | ORAL | Status: DC

## 2022-04-12 MED ORDER — ACETAMINOPHEN 325 MG PO TABS: 650.0000 mg | ORAL_TABLET | ORAL | Status: DC | PRN

## 2022-04-12 MED ORDER — METOPROLOL TARTRATE 25 MG PO TABS
12.5000 mg | ORAL_TABLET | Freq: Two times a day (BID) | ORAL | 1 refills | Status: DC
  Filled 2022-05-10: qty 60, 60d supply, fill #0

## 2022-04-12 NOTE — TOC Transition Note (Signed)
Transition of Care [facility]) - CM/SW Discharge Note   Patient Details  Name: Bryan Mitchell MRN: [id number] Date of Birth: 06-08-1961  [facility]) CM/SW Contact:  Laurena Slimmer, RN Phone Number: 04/12/2022, 3:35 PM   Clinical Narrative:    Discharging home with oxygen, bari wheel chair, and bari walker.    Final next level of care: Home/Self Care Barriers to Discharge: Barriers Resolved   Patient Goals and CMS Choice Patient states their goals for this hospitalization and ongoing recovery are:: To return home CMS Medicare.gov Compare Post Acute Care list provided to:: Patient Choice offered to / list presented to : Patient, Sibling  Discharge Placement                       Discharge Plan and Services   Discharge Planning Services: CM Consult Post Acute Care Choice: [place]          DME Arranged: Oxygen, [place]ford Rile, Wheelchair manual DME Agency: [employer] Date DME Agency Contacted: 04/12/22 Time DME Agency Contacted: [phone] Representative spoke with at DME Agency: [place] ([place]) Interventions     Readmission Risk Interventions     No data to display

## 2022-04-12 NOTE — Progress Notes (Signed)
Physical Therapy Treatment Patient Details Name: Bryan Mitchell MRN: [id number] DOB: 15-Apr-1961 Today's Date: 04/12/2022   History of Present Illness Patient is a 61 year old male with worsening acute on chronic hypoxic/hypercapnic respiratory failure requiring emergent intubation and mechanical ventilatory support. Suspected COPD exacerbation in the setting of HFpEF and possible pulmonary hypertension. Required intuatbion but is now extubated.    PT Comments    Pt was pleasant and motivated to participate during the session and put forth good effort throughout. Pt presented with grossly improved functional strength and activity tolerance this session and was able to amb 50 feet before needing to return to sitting.  Pt performed well during wheelchair training and was able to navigate tight spaces with good control of the chair.  Pt reported no adverse symptoms during the session other than his foot/knee. Pt will benefit from PT services in a SNF setting upon discharge to safely address deficits listed in patient problem list for decreased caregiver assistance and eventual return to PLOF.      Recommendations for follow up therapy are one component of a multi-disciplinary discharge planning process, led by the attending physician.  Recommendations may be updated based on patient status, additional functional criteria and insurance authorization.  Follow Up Recommendations  Skilled nursing-short term rehab (<3 hours/day) Can patient physically be transported by private vehicle: Yes   Assistance Recommended at Discharge Frequent or constant Supervision/Assistance  Patient can return home with the following Help with stairs or ramp for entrance;Assist for transportation;Direct supervision/assist for medications management;Assistance with cooking/housework;A little help with walking and/or transfers;A little help with bathing/dressing/bathroom   Equipment Recommendations  Rolling walker (2  wheels);Wheelchair (measurements PT);BSC/3in1    Recommendations for Other Services       Precautions / Restrictions Precautions Precautions: Fall Restrictions Weight Bearing Restrictions: No     Mobility  Bed Mobility Overal bed mobility: Modified Independent             General bed mobility comments: Min extra time and effort only with bed mobility tasks    Transfers Overall transfer level: Needs assistance Equipment used: Rolling walker (2 wheels) Transfers: Sit to/from Stand Sit to Stand: Min guard, From elevated surface           General transfer comment: Extra time and effort to come to standing from multiple height surfaces    Ambulation/Gait Ambulation/Gait assistance: Min guard Gait Distance (Feet): 50 Feet Assistive device: Rolling walker (2 wheels) Gait Pattern/deviations: Wide base of support, Trunk flexed, Step-to pattern, Decreased step length - right, Decreased stance time - left Gait velocity: decreased     General Gait Details: Mod verbal cues for amb closer to the RW and for step-to sequencing to address L knee pain/instability   Stairs             Wheelchair Mobility    Modified Rankin (Stroke Patients Only)       Balance Overall balance assessment: Needs assistance Sitting-balance support: Feet supported Sitting balance-Leahy Scale: Good     Standing balance support: Bilateral upper extremity supported, During functional activity, Reliant on assistive device for balance Standing balance-Leahy Scale: Fair                              Cognition Arousal/Alertness: Awake/alert Behavior During Therapy: WFL for tasks assessed/performed Overall Cognitive Status: Within Functional Limits for tasks assessed  Exercises Other Exercises Other Exercises: Wheelchair mobility training forwards, backwards, and turns in tight spaces    General Comments         Pertinent Vitals/Pain Pain Assessment Pain Assessment: 0-10 Pain Score: 6  Pain Location: bilateral feet and L knee Pain Descriptors / Indicators: Aching, Sore Pain Intervention(s): Repositioned, Premedicated before session, Monitored during session    Home Living                          Prior Function            PT Goals (current goals can now be found in the care plan section) Progress towards PT goals: Progressing toward goals    Frequency    Min 2X/week      PT Plan Current plan remains appropriate    Co-evaluation              AM-PAC PT "6 Clicks" Mobility   Outcome Measure  Help needed turning from your back to your side while in a flat bed without using bedrails?: None Help needed moving from lying on your back to sitting on the side of a flat bed without using bedrails?: None Help needed moving to and from a bed to a chair (including a wheelchair)?: A Little Help needed standing up from a chair using your arms (e.g., wheelchair or bedside chair)?: A Little Help needed to walk in hospital room?: A Little Help needed climbing 3-5 steps with a railing? : A Lot 6 Click Score: 19    End of Session Equipment Utilized During Treatment: Oxygen;Gait belt Activity Tolerance: Patient limited by pain Patient left: in chair;with call bell/phone within reach Nurse Communication: Mobility status;Other (comment) (pt requested to remain in his wheelchair at end of session) PT Visit Diagnosis: Unsteadiness on feet (R26.81);Muscle weakness (generalized) (M62.81);Pain Pain - Right/Left: Left Pain - part of body: Ankle and joints of foot     Time: 1013-1040 PT Time Calculation (min) (ACUTE ONLY): 27 min  Charges:  $Gait Training: 8-22 mins $Therapeutic Activity: 8-22 mins                     D. Scott Dexter Sauser PT, DPT 04/12/22, 11:56 AM

## 2022-04-12 NOTE — Discharge Summary (Signed)
Physician Discharge Summary   Patient: Bryan Mitchell MRN: [id number] DOB: [birth year]  Admit date:     02/23/2022  Discharge date: 04/13/22  Discharge Physician: Ezekiel Slocumb   PCP: Pcp, No   Recommendations at discharge:   Follow up with Primary Care in 1-2 weeks Follow up on oxygen requirements, discharged on 2 L/min supplemental O2 Follow up on diuretic needs and adjust Lasix dose as needed Consider referral to orthopedics for evaluation of knee pain Consider sleep study and treat for suspected underlying sleep apnea. Pt was counseled to use oxygen when sleeping. Repeat BMP, Mg, CBC in 1-2 weeks We were not able to get home health PT/OT set up due to lack of insurance.  Patient refused to go to SNF/rehab at discharge as was recommended.  Please consider outpatient PT for patient, if he is agreeable (declined it for [place]).  He was set up with bariatric wheelchair and walker for safety at home.    Discharge Diagnoses: Principal Problem:   Acute hypoxemic respiratory failure (HCC) Active Problems:   Tobacco abuse   Morbid obesity (HCC)   COPD exacerbation (HCC)   Type 2 diabetes mellitus with complication, without long-term current use of insulin (HCC)   Acute respiratory failure (HCC)   Pressure injury of skin   Toxic metabolic encephalopathy   Anisocoria  Resolved Problems:   * No resolved hospital problems. [facility] Course: This 61 yrs old Male presenting to [facility] ED on 02/23/22 from home in a wheelchair with his sister for evaluation of worsening fatigue, poor appetite and SOB over the past 2 weeks. Per ED documentation, Patient also complained of worsening bilateral leg swelling and pain.  Patient was admitted for acute on chronic hypoxic and hypercapnic respiratory failure likely secondary to COPD exacerbation and rhinovirus infection which is now resolved.     PT and OT recommended SNF, has been awaiting placement.  TOC working on letter of guarantee given pt lacks  Primary school teacher.     10/5 - pt now refusing to go to SNF/rehab.  Arranging DME he will need to return home. Adjusting diuretic dose given persistent pedal edema impacting his ambulation.  10/6 -- stable for discharge home.  DME delivered.  Assessment and Plan: Acute on chronic hypoxic and hypercapnic respiratory failure: Likely multifactorial due to COPD exacerbation, OSA, Acute on Chronic diastolic heart failure, rhinovirus infection. Rhinovirus infection > resolved.  Patient successfully extubated and placed on HFNC , currently on 3L. Wean from supplemental oxygen as tolerated.  No respiratory distress. Remains on 2 L of supplemental oxygen. Will need home O2 at discharge.   Acute on chronic diastolic heart failure: Pulmonary hypertension Echo shows LVEF 60 to 16%, grade 2 diastolic dysfunction, pulmonary hypertension. Lasix was held due to hypernatremia.  Now resumed Lasix 40 mg PO BID - increase to 60 mg BID given worsening pedal edema. Intake output charting not accurate.     Polycythemia due to chronic hypoxia: H/H normalized.   Type 2 diabetes with morbid obesity > New diagnosis Hb A1c 6.5.  was not on insulin PTA. Continue Metformin 850 mg bid . Continue regular insulin sliding scale.    Urinary retention: > Resolved. Foley removed on 03/18/22, failed voiding trial needing I/O cath re-inserted Foley on 9/13 Continue Flomax.  Patient successfully voided. Urinary retention resolved.   Intermittent confusion: > Improved. Patient continues to have intermittent confusion with agitation likely secondary to hospital delirium. Seroquel is kept on hold until mentation is stable. Patient seems  improved.   Morbid obesity BMI 48: Diet and exercise discussed in detail. Estimated body mass index is 46.63 kg/m as calculated from the following:   Height as of this encounter: 5' 8"  (1.727 m).   Weight as of this encounter: 139.1 kg.    Obstructive sleep apnea: Continue BiPAP  at night. Needs outpatient sleep studies.   Bilateral leg and foot pain: He completed short course of steroids. Left ankle xray shows changes of OA Continue diclofenac gel Continue Gabapentin 200 mg TID ,  given c/o numbness. Continue Percocet 5 mg every 6 hours. Needs outpatient follow up   UTI: UA consistent with UTI, LE: Large,  Nitrite- Completed ceftriaxone for 3 days.         Consultants: NOne Procedures performed: NOne  Disposition: Home (pt declined SNF, unable to obtain [facility] due to lack of insurance)  Diet recommendation:  Cardiac and Carb modified diet  DISCHARGE MEDICATION: Allergies as of 04/12/2022       Reactions   Other Anaphylaxis   McCormick's Rub (Food)   Beef-derived Products Itching   Alpha gal allergy   Pork-derived Products Hives   Alpha gal        Medication List     STOP taking these medications    buPROPion 150 MG 12 hr tablet Commonly known as: WELLBUTRIN SR   diphenhydrAMINE 25 MG tablet Commonly known as: BENADRYL   fluticasone 50 MCG/ACT nasal spray Commonly known as: FLONASE   folic acid 1 MG tablet Commonly known as: FOLVITE   phentermine 15 MG capsule   traMADol 50 MG tablet Commonly known as: ULTRAM   umeclidinium bromide 62.5 MCG/INH Aepb Commonly known as: Incruse Ellipta       TAKE these medications    acetaminophen 325 MG tablet Commonly known as: TYLENOL Take 2 tablets (650 mg total) by mouth every 4 (four) hours as needed for headache or mild pain.   albuterol 108 (90 Base) MCG/ACT inhaler Commonly known as: VENTOLIN HFA Inhale 2 puffs into the lungs every 4 (four) hours as needed for wheezing or shortness of breath. What changed: when to take this   aspirin 81 MG tablet Take 81 mg by mouth daily.   blood glucose meter kit and supplies Dispense based on patient and insurance preference. Use up to four times daily as directed. (FOR ICD-10 E10.9, E11.9).   diclofenac Sodium 1 % Gel Commonly known  as: VOLTAREN Apply 2 g topically 3 (three) times daily as needed (pain). Use on knees and other areas of pain   docusate sodium 100 MG capsule Commonly known as: COLACE Take 1 capsule (100 mg total) by mouth 2 (two) times daily as needed for mild constipation.   feeding supplement Liqd Take 237 mLs by mouth 3 (three) times daily between meals.   furosemide 20 MG tablet Commonly known as: LASIX Take 3 tablets (60 mg total) by mouth 2 (two) times daily. What changed:  medication strength how much to take when to take this   gabapentin 100 MG capsule Commonly known as: NEURONTIN Take 2 capsules (200 mg total) by mouth 3 (three) times daily.   meloxicam 15 MG tablet Commonly known as: MOBIC Take 15 mg by mouth daily.   metFORMIN 850 MG tablet Commonly known as: GLUCOPHAGE Take 1 tablet (850 mg total) by mouth 2 (two) times daily with a meal.   metoprolol tartrate 25 MG tablet Commonly known as: LOPRESSOR Take 0.5 tablets (12.5 mg total) by mouth 2 (two) times  daily.   multivitamin with minerals Tabs tablet Take 1 tablet by mouth daily.   oxyCODONE-acetaminophen 5-325 MG tablet Commonly known as: PERCOCET/ROXICET Take 1 tablet by mouth every 6 (six) hours as needed for severe pain.   tamsulosin 0.4 MG Caps capsule Commonly known as: FLOMAX Take 1 capsule (0.4 mg total) by mouth daily.               Discharge Care Instructions  (From admission, onward)           Start     Ordered   04/12/22 0000  Discharge wound care:       Comments: Let the area heal on its own.  Do not intentionally open up the blister. If the blister opens on its own, be sure to keep the area clean - wash with soap and water, pat dry.   04/12/22 1412            Discharge Exam: Filed Weights   04/10/22 1110 04/11/22 0457 04/12/22 0500  Weight: (!) 141 kg (!) 139.1 kg (!) 139.6 kg   General exam: awake, alert, no acute distress, morbidly obese HEENT: moist mucus membranes,  hearing grossly normal  Respiratory system: CTAB, no wheezes, rales or rhonchi, normal respiratory effort, on 2 L/min o2. Cardiovascular system: normal S1/S2, RRR, improved pedal edema.   Gastrointestinal system: soft, NT, ND, no HSM felt, +bowel sounds. Central nervous system: A&O x4. no gross focal neurologic deficits, normal speech Extremities: moves all, no edema, normal tone, LE venous stasis Skin: dry, intact, normal temperature Psychiatry: normal mood, congruent affect, judgement and insight appear normal   Condition at discharge: stable  The results of significant diagnostics from this hospitalization (including imaging, microbiology, ancillary and laboratory) are listed below for reference.   Imaging Studies: DG Abd 1 View  Result Date: 04/10/2022 CLINICAL DATA:  Abdominal pain EXAM: ABDOMEN - 1 VIEW COMPARISON:  03/05/2022 FINDINGS: Scattered large and small bowel gas is noted. No abnormal mass or abnormal calcifications are seen. Fecal material is noted throughout the right and transverse colons consistent with a degree of constipation. No obstructive changes are seen. Degenerative changes of lumbar spine are noted. IMPRESSION: Mild constipation. Electronically Signed   By: Inez Catalina M.D.   On: 04/10/2022 20:06   DG Ankle Complete Left  Result Date: 03/29/2022 CLINICAL DATA:  Left ankle pain. EXAM: LEFT ANKLE COMPLETE - 3+ VIEW COMPARISON:  Left tibia and fibula radiographs 04/21/2012 FINDINGS: The medial aspect of the tibiotalar clear space appears minimally increased compared to the lateral clear space, measuring up to approximately 6 mm compared to 4 mm, respectively. This appears not significantly changed from 04/21/2012 prior tibiofibular radiographs. Mild distal medial malleolar degenerative osteophytosis. Mild-to-moderate distal anterior tibial plafond degenerative osteophytosis and subchondral cystic change. Mild-to-moderate plantar and posterior calcaneal heel spurs. Mild  diffuse soft tissue swelling. No acute fracture is seen. No dislocation. IMPRESSION: 1. No acute fracture is seen. 2. Mild tibiotalar osteoarthritis. 3. Mild-to-moderate plantar and posterior calcaneal heel spurs. Electronically Signed   By: Yvonne Kendall M.D.   On: 03/29/2022 15:56    Microbiology: Results for orders placed or performed during the hospital encounter of 02/23/22  Resp Panel by RT-PCR (Flu A&B, Covid) Anterior Nasal Swab     Status: None   Collection Time: 02/23/22  5:41 PM   Specimen: Anterior Nasal Swab  Result Value Ref Range Status   SARS Coronavirus 2 by RT PCR NEGATIVE NEGATIVE Final    Comment: (NOTE) SARS-CoV-2  target nucleic acids are NOT DETECTED.  The SARS-CoV-2 RNA is generally detectable in upper respiratory specimens during the acute phase of infection. The lowest concentration of SARS-CoV-2 viral copies this assay can detect is 138 copies/mL. A negative result does not preclude SARS-Cov-2 infection and should not be used as the sole basis for treatment or other patient management decisions. A negative result may occur with  improper specimen collection/handling, submission of specimen other than nasopharyngeal swab, presence of viral mutation(s) within the areas targeted by this assay, and inadequate number of viral copies(<138 copies/mL). A negative result must be combined with clinical observations, patient history, and epidemiological information. The expected result is Negative.  Fact Sheet for Patients:  EntrepreneurPulse.com.au  Fact Sheet for Healthcare Providers:  IncredibleEmployment.be  This test is no t yet approved or cleared by the [place] FDA and  has been authorized for detection and/or diagnosis of SARS-CoV-2 by FDA under an Emergency Use Authorization (EUA). This EUA will remain  in effect (meaning this test can be used) for the duration of the COVID-19 declaration under Section 564(b)(1) of  the Act, 21 U.S.C.section 360bbb-3(b)(1), unless the authorization is terminated  or revoked sooner.       Influenza A by PCR NEGATIVE NEGATIVE Final   Influenza B by PCR NEGATIVE NEGATIVE Final    Comment: (NOTE) The Xpert Xpress SARS-CoV-2/FLU/RSV plus assay is intended as an aid in the diagnosis of influenza from Nasopharyngeal swab specimens and should not be used as a sole basis for treatment. Nasal washings and aspirates are unacceptable for Xpert Xpress SARS-CoV-2/FLU/RSV testing.  Fact Sheet for Patients: EntrepreneurPulse.com.au  Fact Sheet for Healthcare Providers: IncredibleEmployment.be  This test is not yet approved or cleared by the [place] FDA and has been authorized for detection and/or diagnosis of SARS-CoV-2 by FDA under an Emergency Use Authorization (EUA). This EUA will remain in effect (meaning this test can be used) for the duration of the COVID-19 declaration under Section 564(b)(1) of the Act, 21 U.S.C. section 360bbb-3(b)(1), unless the authorization is terminated or revoked.  Performed at [facility], [place], [place] [address]   Respiratory (~20 pathogens) panel by PCR     Status: Abnormal   Collection Time: 02/23/22 10:14 PM   Specimen: Nasopharyngeal Swab; Respiratory  Result Value Ref Range Status   Adenovirus NOT DETECTED NOT DETECTED Final   Coronavirus 229E NOT DETECTED NOT DETECTED Final    Comment: (NOTE) The Coronavirus on the Respiratory Panel, DOES NOT test for the novel  Coronavirus (2019 nCoV)    Coronavirus HKU1 NOT DETECTED NOT DETECTED Final   Coronavirus NL63 NOT DETECTED NOT DETECTED Final   Coronavirus OC43 NOT DETECTED NOT DETECTED Final   Metapneumovirus NOT DETECTED NOT DETECTED Final   Rhinovirus / Enterovirus DETECTED (A) NOT DETECTED Final   Influenza A NOT DETECTED NOT DETECTED Final   Influenza B NOT DETECTED NOT DETECTED Final   Parainfluenza  Virus 1 NOT DETECTED NOT DETECTED Final   Parainfluenza Virus 2 NOT DETECTED NOT DETECTED Final   Parainfluenza Virus 3 NOT DETECTED NOT DETECTED Final   Parainfluenza Virus 4 NOT DETECTED NOT DETECTED Final   Respiratory Syncytial Virus NOT DETECTED NOT DETECTED Final   Bordetella pertussis NOT DETECTED NOT DETECTED Final   Bordetella Parapertussis NOT DETECTED NOT DETECTED Final   Chlamydophila pneumoniae NOT DETECTED NOT DETECTED Final   Mycoplasma pneumoniae NOT DETECTED NOT DETECTED Final    Comment: Performed at [facility], [place].  [address] [place], [place] [address]  MRSA Next Gen by PCR, Nasal     Status: None   Collection Time: 02/23/22 11:55 PM   Specimen: Nasal Mucosa; Nasal Swab  Result Value Ref Range Status   MRSA by PCR Next Gen NOT DETECTED NOT DETECTED Final    Comment: (NOTE) The GeneXpert MRSA Assay (FDA approved for NASAL specimens only), is one component of a comprehensive MRSA colonization surveillance program. It is not intended to diagnose MRSA infection nor to guide or monitor treatment for MRSA infections. Test performance is not FDA approved in patients less than 87 years old. Performed at [facility], [place] [place], [place] [address]   Culture, Respiratory w Gram Stain     Status: None   Collection Time: 02/24/22  8:30 AM   Specimen: Tracheal Aspirate; Respiratory  Result Value Ref Range Status   Specimen Description   Final    TRACHEAL ASPIRATE Performed at [facility], [address], [place], [place] [address]    Special Requests   Final    NONE Performed at [facility], [place] [place], [place] [address]    Gram Stain   Final    ABUNDANT WBC PRESENT, PREDOMINANTLY PMN FEW GRAM POSITIVE COCCI RARE GRAM POSITIVE RODS RARE GRAM NEGATIVE RODS    Culture   Final    RARE Normal respiratory flora-no Staph aureus or Pseudomonas seen Performed at [facility], [address],  [place], [place] [address]    Report Status 02/26/2022 FINAL  Final  Culture, Respiratory w Gram Stain     Status: None   Collection Time: 03/04/22  4:07 PM   Specimen: Tracheal Aspirate; Respiratory  Result Value Ref Range Status   Specimen Description   Final    TRACHEAL ASPIRATE Performed at [facility], [address], [place], [place] [address]    Special Requests   Final    NONE Performed at [facility], [place] [place], [place] [address]    Gram Stain   Final    FEW WBC PRESENT, PREDOMINANTLY PMN NO ORGANISMS SEEN    Culture   Final    RARE Normal respiratory flora-no Staph aureus or Pseudomonas seen Performed at [facility] [address] [place], [place] [address]    Report Status 03/07/2022 FINAL  Final  Culture, Respiratory w Gram Stain     Status: None   Collection Time: 03/07/22 10:25 AM   Specimen: Tracheal Aspirate; Respiratory  Result Value Ref Range Status   Specimen Description   Final    TRACHEAL ASPIRATE Performed at [facility], [address], [place], [place] [address]    Special Requests   Final    NONE Performed at [facility], [place], [place] [address]    Gram Stain   Final    FEW WBC PRESENT,BOTH PMN AND MONONUCLEAR RARE GRAM POSITIVE RODS    Culture   Final    RARE DIPHTHEROIDS(CORYNEBACTERIUM SPECIES) Standardized susceptibility testing for this organism is not available. Performed at [facility], [place] [address] [place], [place] [address]    Report Status 03/09/2022 FINAL  Final    Labs: CBC: No results for input(s): "WBC", "NEUTROABS", "HGB", "HCT", "MCV", "PLT" in the last 168 hours.  Basic Metabolic Panel: Recent Labs  Lab 04/12/22 0616  NA 141  K 4.2  CL 98  CO2 39*  GLUCOSE 147*  BUN 24*  CREATININE 0.50*  CALCIUM 9.2  MG 2.1   Liver Function Tests: No results for input(s): "AST", "ALT", "ALKPHOS", "BILITOT", "PROT", "ALBUMIN" in the last  168 hours. CBG: Recent Labs  Lab 04/11/22 1609 04/11/22 2041 04/12/22 0254 04/12/22 0811 04/12/22 1221  GLUCAP 183* 210* 152* 174* 165*    Discharge time spent: greater than 30 minutes.  Signed: Ezekiel Slocumb, DO [facility] 04/13/2022

## 2022-04-12 NOTE — TOC Progression Note (Addendum)
Transition of Care [facility]) - Progression Note    Patient Details  Name: Bryan Mitchell MRN: [id number] Date of Birth: Aug 06, 1960  [facility]) CM/SW Contact  Laurena Slimmer, RN Phone Number: 04/12/2022, 1:29 PM  Clinical Narrative:    Oxygen requested via [employer]  Spoke with patient at bedside. Patient unaware he was discharging today despite being told by MD and assigned nurse. Patient stated he had a PCP via [facility] in [place]. He was last seen approximately 3 months ago. Advised to follow up with them. [facility] information provided as well. Patient stated his sister could pick him up after work. Oxygen has been delivered to room.   Attempt to contact patient's sister, Bethena Roys twice. No answer. Unable to leave a message  Retrieved a call from [facility]. She stated she would pick patient up at 4:15 in the [place]. Nurse notified. TOC signing off.      Expected Discharge Plan: [place] Barriers to Discharge: Continued Medical Work up  Expected Discharge Plan and Services Expected Discharge Plan: [place]   Discharge Planning Services: CM Consult Post Acute Care Choice: [place] Living arrangements for the past 2 months: Single Family Home                                       Social Determinants of Health (SDOH) Interventions    Readmission Risk Interventions     No data to display

## 2022-04-12 NOTE — Progress Notes (Signed)
SATURATION QUALIFICATIONS: (This note is used to comply with regulatory documentation for home oxygen)  Patient Saturations on Room Air at Rest = 84%   Patient Saturations on 2 Liters of oxygen while Ambulating = 93%  Please briefly explain why patient needs home oxygen: Pt needs O2 to help maintain oxygenation saturations at baseline while at rest.

## 2022-04-18 ENCOUNTER — Other Ambulatory Visit: Payer: Self-pay

## 2022-04-19 ENCOUNTER — Other Ambulatory Visit: Payer: Self-pay

## 2022-05-10 ENCOUNTER — Other Ambulatory Visit: Payer: Self-pay

## 2022-05-13 ENCOUNTER — Other Ambulatory Visit: Payer: Self-pay

## 2022-09-14 ENCOUNTER — Emergency Department
Admission: EM | Admit: 2022-09-14 | Discharge: 2022-09-14 | Disposition: A | Discharge status: Home / Self Care | Payer: Medicaid Other | Attending: Emergency Medicine | Admitting: Emergency Medicine

## 2022-09-14 DIAGNOSIS — R0602 Shortness of breath: Secondary | ICD-10-CM | POA: Diagnosis present

## 2022-09-14 DIAGNOSIS — T782XXA Anaphylactic shock, unspecified, initial encounter: Secondary | ICD-10-CM | POA: Diagnosis not present

## 2022-09-14 DIAGNOSIS — E119 Type 2 diabetes mellitus without complications: Secondary | ICD-10-CM | POA: Diagnosis not present

## 2022-09-14 DIAGNOSIS — I509 Heart failure, unspecified: Secondary | ICD-10-CM | POA: Diagnosis not present

## 2022-09-14 DIAGNOSIS — J9611 Chronic respiratory failure with hypoxia: Secondary | ICD-10-CM | POA: Insufficient documentation

## 2022-09-14 DIAGNOSIS — J449 Chronic obstructive pulmonary disease, unspecified: Secondary | ICD-10-CM | POA: Insufficient documentation

## 2022-09-14 MED ORDER — PREDNISONE 50 MG PO TABS: 50.0000 mg | ORAL_TABLET | Freq: Every day | ORAL | 0 refills | Status: AC

## 2022-09-14 MED ORDER — DIPHENHYDRAMINE HCL 50 MG/ML IJ SOLN
25.0000 mg | Freq: Once | INTRAMUSCULAR | Status: AC
  Administered 2022-09-14: 25 mg via INTRAVENOUS
  Filled 2022-09-14: qty 1

## 2022-09-14 MED ORDER — FAMOTIDINE 20 MG PO TABS: 20.0000 mg | ORAL_TABLET | Freq: Every day | ORAL | 0 refills | Status: DC

## 2022-09-14 MED ORDER — EPINEPHRINE 0.3 MG/0.3ML IJ SOAJ: 0.3000 mg | INTRAMUSCULAR | 0 refills | Status: DC | PRN

## 2022-09-14 MED ORDER — FAMOTIDINE IN NACL 20-0.9 MG/50ML-% IV SOLN
20.0000 mg | Freq: Once | INTRAVENOUS | Status: AC
  Administered 2022-09-14: 20 mg via INTRAVENOUS
  Filled 2022-09-14: qty 50

## 2022-09-14 MED ORDER — HYDROXYZINE HCL 25 MG PO TABS: 25.0000 mg | ORAL_TABLET | Freq: Three times a day (TID) | ORAL | 0 refills | Status: DC | PRN

## 2022-09-14 MED ORDER — HYDROXYZINE HCL 25 MG PO TABS: 25.0000 mg | ORAL_TABLET | Freq: Three times a day (TID) | ORAL | 0 refills | Status: AC | PRN

## 2022-09-14 MED ORDER — SODIUM CHLORIDE 0.9 % IV BOLUS
1000.0000 mL | Freq: Once | INTRAVENOUS | Status: AC
  Administered 2022-09-14: 1000 mL via INTRAVENOUS

## 2022-09-14 MED ORDER — IPRATROPIUM-ALBUTEROL 0.5-2.5 (3) MG/3ML IN SOLN
9.0000 mL | Freq: Once | RESPIRATORY_TRACT | Status: AC
  Administered 2022-09-14: 9 mL via RESPIRATORY_TRACT
  Filled 2022-09-14: qty 9

## 2022-09-14 MED ORDER — PREDNISONE 50 MG PO TABS: 50.0000 mg | ORAL_TABLET | Freq: Every day | ORAL | 0 refills | Status: DC

## 2022-09-14 NOTE — ED Provider Notes (Addendum)
[facility] Provider Note    Event Date/Time   First MD Initiated Contact with Patient 09/14/22 2028     (approximate)   History   Allergic Reaction   HPI  Bryan Mitchell is a 62 y.o. male past medical history significant for morbid obesity, alpha gal allergy, COPD, CHF, diabetes, polycythemia from chronic hypoxia, who presents to the emergency department for shortness of breath and rash.  Patient states that he was eating sausage just prior to arrival and then developed symptoms of an allergic reaction.  Patient has a known alpha gal allergy.  Ate 2 sausage just prior to arrival.  Developed shortness of breath, rash with urticaria, diffuse wheezing, throat swelling and tongue swelling.  When EMS arrived patient had significant tongue swelling.  Was given epinephrine, IV steroids and DuoNeb treatments.  Placed on a 15 L nonrebreather.  Patient took multiple doses of Benadryl prior to arrival.  Feels like his symptoms have improved     Physical Exam   Triage Vital Signs: ED Triage Vitals  Enc Vitals Group     BP      Pulse      Resp      Temp      Temp src      SpO2      Weight      Height      Head Circumference      Peak Flow      Pain Score      Pain Loc      Pain Edu?      Excl. in [place]?     Most recent vital signs: Vitals:   09/14/22 2130 09/14/22 2200  BP: 110/71 (!) 96/59  Pulse: (!) 103 98  Resp: (!) 24 (!) 27  SpO2: 90% 90%    Physical Exam Constitutional:      Appearance: He is well-developed. He is obese.  HENT:     Head: Atraumatic.  Eyes:     Conjunctiva/sclera: Conjunctivae normal.  Cardiovascular:     Rate and Rhythm: Regular rhythm.  Pulmonary:     Effort: Respiratory distress present.     Breath sounds: Wheezing present.     Comments: Hypoxic to 80% on room air, placed on 15 L nonrebreather Musculoskeletal:        General: Normal range of motion.     Cervical back: Normal range of motion.  Skin:    General: Skin  is warm.     Findings: Rash (Diffuse urticarial rash) present.  Neurological:     Mental Status: He is alert. Mental status is at baseline.     IMPRESSION / MDM / ASSESSMENT AND PLAN / ED COURSE  I reviewed the triage vital signs and the nursing notes.  Differential diagnosis including anaphylactic reaction, COPD exacerbation, pulmonary hypertension  With EMS patient received epinephrine, IV Solu-Medrol  On chart review patient has chronic hypoxia and is noncompliant with CPAP, history of heart failure and COPD.  EKG  No tachycardic or bradycardic dysrhythmias while on cardiac telemetry.   LABS (all labs ordered are listed, but only abnormal results are displayed) Labs interpreted as -    Labs Reviewed - No data to display  TREATMENT  DuoNeb, IV Benadryl, IV Pepcid  MDM  Clinical picture is consistent with anaphylactic reaction.  Significant hypoxia on room air to 80%, placed on nonrebreather and given another DuoNeb treatment.  Improvement of tongue swelling and throat swelling.  Will monitor in the emergency  department for recurrence of anaphylactic reaction.  On reevaluation significant improvement of his anaphylactic reaction.  Patient continues to be hypoxic, on chart review has a history of chronic hypoxia and has a long history of noncompliance with oxygen and CPAP.  Patient states he is supposed to wear oxygen at all times but he chooses not to.  He is normally on 2 L to 4 L.  Patient requiring 3 L nasal cannula emergency department.  Encouraged to wear his home oxygen and follow-up with his primary care physician and pulmonologist.  Will discharge the patient home with prescription for epinephrine, steroids, famotidine and antihistamines.     PROCEDURES:  Critical Care performed: yes  .Critical Care  Performed by: Nathaniel Man, MD Authorized by: Nathaniel Man, MD   Critical care provider statement:    Critical care time (minutes):  30   Critical care  time was exclusive of:  Separately billable procedures and treating other patients   Critical care was necessary to treat or prevent imminent or life-threatening deterioration of the following conditions:  Circulatory failure   Critical care was time spent personally by me on the following activities:  Development of treatment plan with patient or surrogate, discussions with consultants, evaluation of patient's response to treatment, examination of patient, ordering and review of laboratory studies, ordering and review of radiographic studies, ordering and performing treatments and interventions, pulse oximetry, re-evaluation of patient's condition and review of old charts   Patient's presentation is most consistent with acute presentation with potential threat to life or bodily function.   MEDICATIONS ORDERED IN ED: Medications  diphenhydrAMINE (BENADRYL) injection 25 mg (25 mg Intravenous Given 09/14/22 2049)  famotidine (PEPCID) IVPB 20 mg premix (0 mg Intravenous Stopped 09/14/22 2115)  ipratropium-albuterol (DUONEB) 0.5-2.5 (3) MG/3ML nebulizer solution 9 mL (9 mLs Nebulization Given 09/14/22 2056)  sodium chloride 0.9 % bolus 1,000 mL (0 mLs Intravenous Stopped 09/14/22 2221)  diphenhydrAMINE (BENADRYL) injection 25 mg (25 mg Intravenous Given 09/14/22 2128)    FINAL CLINICAL IMPRESSION(S) / ED DIAGNOSES   Final diagnoses:  Anaphylaxis, initial encounter  Chronic hypoxic respiratory failure ([place])     Rx / DC Orders   ED Discharge Orders          Ordered    EPINEPHrine 0.3 mg/0.3 mL IJ SOAJ injection  As needed,   Status:  Discontinued        09/14/22 2130    predniSONE (DELTASONE) 50 MG tablet  Daily with breakfast,   Status:  Discontinued        09/14/22 2130    famotidine (PEPCID) 20 MG tablet  Daily,   Status:  Discontinued        09/14/22 2130    hydrOXYzine (ATARAX) 25 MG tablet  3 times daily PRN,   Status:  Discontinued        09/14/22 2130    EPINEPHrine 0.3 mg/0.3 mL IJ  SOAJ injection  As needed        09/14/22 2317    famotidine (PEPCID) 20 MG tablet  Daily        09/14/22 2317    hydrOXYzine (ATARAX) 25 MG tablet  3 times daily PRN        09/14/22 2317    predniSONE (DELTASONE) 50 MG tablet  Daily with breakfast        09/14/22 2317             Note:  This document was prepared using Dragon voice recognition software and may  include unintentional dictation errors.   Nathaniel Man, MD 09/14/22 [id number]    Nathaniel Man, MD 09/14/22 2344

## 2022-09-14 NOTE — ED Notes (Signed)
Report given to Dorian RN 

## 2022-09-14 NOTE — ED Triage Notes (Signed)
Pt presents to the ED with [facility] from home. Pt has alpha gal. Pt had sausage about 2 hours ago. Started to have an allergic reaction. Pt took 50-'75mg'$  of benadryl at home before EMS got there. Was given IM epi and solumedrol en route. Hives noted to torso. Pt reports tongue swelling has improved since receiving epi. Pt was 84% RA with EMS and they placed him on 15L NRB. Pt reports chest tightness. ED MD at bedside at time of triage.

## 2022-09-14 NOTE — ED Notes (Signed)
Pt reports itching all over. Provider notified.

## 2022-09-27 ENCOUNTER — Ambulatory Visit (INDEPENDENT_AMBULATORY_CARE_PROVIDER_SITE_OTHER): Payer: Medicaid Other | Admitting: Family Medicine

## 2022-09-27 ENCOUNTER — Encounter: Payer: Self-pay | Admitting: Family Medicine

## 2022-09-27 VITALS — BP 113/74 | HR 76 | Temp 98.3°F | Ht 68.0 in | Wt 336.5 lb

## 2022-09-27 DIAGNOSIS — J449 Chronic obstructive pulmonary disease, unspecified: Secondary | ICD-10-CM

## 2022-09-27 DIAGNOSIS — I5032 Chronic diastolic (congestive) heart failure: Secondary | ICD-10-CM

## 2022-09-27 DIAGNOSIS — Z7689 Persons encountering health services in other specified circumstances: Secondary | ICD-10-CM

## 2022-09-27 DIAGNOSIS — G8929 Other chronic pain: Secondary | ICD-10-CM

## 2022-09-27 DIAGNOSIS — M545 Low back pain, unspecified: Secondary | ICD-10-CM

## 2022-09-27 DIAGNOSIS — Z91014 Allergy to mammalian meats: Secondary | ICD-10-CM

## 2022-09-27 DIAGNOSIS — E118 Type 2 diabetes mellitus with unspecified complications: Secondary | ICD-10-CM

## 2022-09-27 NOTE — Patient Instructions (Signed)
It was nice to meet you,   I would like you to come back in 3 weeks to talk about your diabetes.  No changes to your medications were made.    Have a great day,   Dr. Jeannine Kitten

## 2022-09-27 NOTE — Assessment & Plan Note (Addendum)
Is supposed to take metformin 850 twice daily.  Only takes it once a day. - Follow-up in next 3 to 4 weeks so we can discuss changing diabetic medications. - Had long discussion about proper diet. - At next visit can switch to metformin XL. - Given his obesity, would benefit from GLP-1 addition as well. - Follow-up A1c - Diabetic foot exam at next visit - Urine albumin creatinine ratio prior to next visit - Lipid panel and discuss starting a statin next visit.

## 2022-09-27 NOTE — Assessment & Plan Note (Signed)
Advised patient against long-term use of chronic NSAIDs - Advised him to use Tylenol and topical medications. - If patient needs better control discussed the different options of orthopedics versus physical medicine and rehab versus "" pain management clinics" - Advised him he would need to go to a pain management clinic if he wanted to continue chronic opioid therapy

## 2022-09-27 NOTE — Assessment & Plan Note (Signed)
Encourage patient to use his oxygen with goal of above 89% SpO2.  Patient noncompliant currently with oxygen. - At next visit for COPD will need to discuss long-term inhalers - Would need up-to-date pulmonary function test for further classification of degree of COPD. - Encourage smoking sensation - At future visit discuss lung cancer screening tests

## 2022-09-27 NOTE — Assessment & Plan Note (Addendum)
Echo performed while he was in the hospital last year.  No cardiologist currently. Takes metoprolol for heart failure and blood pressure currently.  Takes metoprolol tartrate twice daily. - At next visit discuss transitioning his metoprolol from twice daily to once daily Toprol-XL for better compliance. - Will need to discuss cardiology referral in the future - Continue taking furosemide - CMP before next visit

## 2022-09-27 NOTE — Progress Notes (Signed)
New Patient Office Visit  Subjective    Patient ID: Bryan Mitchell, male    DOB: [birth year]  Age: 62 y.o. MRN: [id number]  CC:  Chief Complaint  Patient presents with   Establish Care    HPI Bryan Mitchell presents to establish care.  He is accompanied by his sister.  He has previously been going to [facility] since October of last year when he got out of the hospital.  Prior to that he was not going to a doctor on a regular basis.  He was not taking medications.  Now is taking medications for diabetes heart failure and blood pressure.  Patient states he was diagnosed with diabetes while in the hospital last year, but we discussed previous notes from 2019 that showed he had an elevated A1c at that time and had seen a provider where they discussed it.  He does not remember this.  We had a long discussion about the mechanics of diabetes type 2 and diet.  We had a discussion about the patient's diagnosis of diastolic heart failure.  Patient states he was told he has a "thick heart".  We discussed LV hypertrophy.  Patient does not want to take medications at night.  Only refers to take medications in the morning.  We discussed possibly changing some of his medications from twice a day to once a day at next visit.  Patient states he is aware he has had COPD for a while.  He is supposed to wear oxygen at home but states he "rarely uses it".  We discussed his ideal oxygen levels and the importance of not being hypoxic chronically.  Discussed both long-term and acute dangers of hypoxia.  Alpha gal allergy-patient recently seen in the urgent care for anaphylactic reaction.  He states he has a EpiPen at home.  Advised to continue keeping that on him at all times.  Discussed the mechanisms of alpha gal allergy and avoiding red meat including pork and beef.  Discussed following up in 3 weeks and getting further blood work before changing any medications.   Outpatient Encounter  Medications as of 09/27/2022  Medication Sig   acetaminophen (TYLENOL) 325 MG tablet Take 2 tablets (650 mg total) by mouth every 4 (four) hours as needed for headache or mild pain.   albuterol (VENTOLIN HFA) 108 (90 Base) MCG/ACT inhaler Inhale 2 puffs into the lungs every 4 (four) hours as needed for wheezing or shortness of breath.   aspirin 81 MG tablet Take 81 mg by mouth daily.   blood glucose meter kit and supplies Dispense based on patient and insurance preference. Use up to four times daily as directed. (FOR ICD-10 E10.9, E11.9).   diclofenac Sodium (VOLTAREN) 1 % GEL Apply 2 g topically 3 (three) times daily as needed (pain). Use on knees and other areas of pain   docusate sodium (COLACE) 100 MG capsule Take 1 capsule (100 mg total) by mouth 2 (two) times daily as needed for mild constipation.   EPINEPHrine 0.3 mg/0.3 mL IJ SOAJ injection Inject 0.3 mg into the muscle as needed for anaphylaxis.   famotidine (PEPCID) 20 MG tablet Take 1 tablet (20 mg total) by mouth daily for 5 days.   feeding supplement (ENSURE ENLIVE / ENSURE PLUS) LIQD Take 237 mLs by mouth 3 (three) times daily between meals.   furosemide (LASIX) 20 MG tablet Take 3 tablets (60 mg total) by mouth 2 (two) times daily.   gabapentin (NEURONTIN) 100 MG capsule Take  2 capsules (200 mg total) by mouth 3 (three) times daily.   meloxicam (MOBIC) 15 MG tablet Take 15 mg by mouth daily. (Patient not taking: Reported on 02/23/2022)   metFORMIN (GLUCOPHAGE) 850 MG tablet Take 1 tablet (850 mg total) by mouth 2 (two) times daily with a meal.   metoprolol tartrate (LOPRESSOR) 25 MG tablet Take 0.5 tablets (12.5 mg total) by mouth 2 (two) times daily.   Multiple Vitamin (MULTIVITAMIN WITH MINERALS) TABS tablet Take 1 tablet by mouth daily.   oxyCODONE-acetaminophen (PERCOCET/ROXICET) 5-325 MG tablet Take 1 tablet by mouth every 6 (six) hours as needed for severe pain.   tamsulosin (FLOMAX) 0.4 MG CAPS capsule Take 1 capsule (0.4 mg  total) by mouth daily.   No facility-administered encounter medications on file as of 09/27/2022.    Past Medical History:  Diagnosis Date   (HFpEF) heart failure with preserved ejection fraction (HCC)    Allergic reaction 12/15/2016   COPD (chronic obstructive pulmonary disease) ([place])    Hyperglycemia 12/2016   Hypertension    Polycythemia     Past Surgical History:  Procedure Laterality Date   KNEE SURGERY Right     Family History  Problem Relation Age of Onset   CAD Father    CAD Brother     Social History   Socioeconomic History   Marital status: Single    Spouse name: Not on file   Number of children: Not on file   Years of education: Not on file   Highest education level: Not on file  Occupational History   Not on file  Tobacco Use   Smoking status: Every Day    Types: Cigars   Smokeless tobacco: Never  Vaping Use   Vaping Use: Never used  Substance and Sexual Activity   Alcohol use: Yes   Drug use: No   Sexual activity: Not on file  Other Topics Concern   Not on file  Social History Narrative   Not on file   Social Determinants of Health   Financial Resource Strain: Not on file  Food Insecurity: Not on file  Transportation Needs: Not on file  Physical Activity: Not on file  Stress: Not on file  Social Connections: Not on file  Intimate Partner Violence: Not on file    ROS      Objective    BP 113/74   Pulse 76   Temp 98.3 F (36.8 C) (Oral)   Ht 5\' 8"  (1.727 m)   Wt (!) 336 lb 8 oz (152.6 kg)   SpO2 (!) 89%   BMI 51.16 kg/m   Physical Exam  General: Alert, oriented.  Obese. HEENT: Bilateral conjunctival erythema Pulmonary: No respiratory distress.  No cough.  Not wearing oxygen. GI: Large pannus       Assessment & Plan:   Problem List Items Addressed This Visit       Cardiovascular and Mediastinum   Chronic diastolic heart failure ([place]) - Primary    Echo performed while he was in the hospital last year.  No  cardiologist currently. Takes metoprolol for heart failure and blood pressure currently.  Takes metoprolol tartrate twice daily. - At next visit discuss transitioning his metoprolol from twice daily to once daily Toprol-XL for better compliance. - Will need to discuss cardiology referral in the future - Continue taking furosemide - CMP before next visit         Respiratory   COPD (chronic obstructive pulmonary disease) ([place])    Encourage  patient to use his oxygen with goal of above 89% SpO2.  Patient noncompliant currently with oxygen. - At next visit for COPD will need to discuss long-term inhalers - Would need up-to-date pulmonary function test for further classification of degree of COPD. - Encourage smoking sensation - At future visit discuss lung cancer screening tests        Endocrine   Type 2 diabetes mellitus with complication, without long-term current use of insulin ([place])    Is supposed to take metformin 850 twice daily.  Only takes it once a day. - Follow-up in next 3 to 4 weeks so we can discuss changing diabetic medications. - Had long discussion about proper diet. - At next visit can switch to metformin XL. - Given his obesity, would benefit from GLP-1 addition as well. - Follow-up A1c - Diabetic foot exam at next visit - Urine albumin creatinine ratio prior to next visit - Lipid panel and discuss starting a statin next visit.        Other   Chronic low back pain    Advised patient against long-term use of chronic NSAIDs - Advised him to use Tylenol and topical medications. - If patient needs better control discussed the different options of orthopedics versus physical medicine and rehab versus "" pain management clinics" - Advised him he would need to go to a pain management clinic if he wanted to continue chronic opioid therapy      Encounter to establish care    Return in about 3 weeks (around 10/18/2022) for DM.   Benay Pike, MD

## 2022-10-02 ENCOUNTER — Other Ambulatory Visit: Payer: Medicaid Other

## 2022-10-02 DIAGNOSIS — Z91014 Allergy to mammalian meats: Secondary | ICD-10-CM

## 2022-10-02 DIAGNOSIS — I5032 Chronic diastolic (congestive) heart failure: Secondary | ICD-10-CM

## 2022-10-02 DIAGNOSIS — E118 Type 2 diabetes mellitus with unspecified complications: Secondary | ICD-10-CM

## 2022-10-03 LAB — MICROALBUMIN / CREATININE URINE RATIO
Creatinine, Urine: 14.8 mg/dL
Microalb/Creat Ratio: 33 mg/g creat — ABNORMAL HIGH (ref 0–29)
Microalbumin, Urine: 4.9 ug/mL

## 2022-10-07 ENCOUNTER — Other Ambulatory Visit (HOSPITAL_COMMUNITY): Payer: Self-pay

## 2022-10-08 LAB — COMPREHENSIVE METABOLIC PANEL
ALT: 29 IU/L (ref 0–44)
AST: 23 IU/L (ref 0–40)
Albumin/Globulin Ratio: 1.4 (ref 1.2–2.2)
Albumin: 4.2 g/dL (ref 3.8–4.9)
Alkaline Phosphatase: 96 IU/L (ref 44–121)
BUN/Creatinine Ratio: 27 — ABNORMAL HIGH (ref 10–24)
BUN: 21 mg/dL (ref 8–27)
Bilirubin Total: 0.4 mg/dL (ref 0.0–1.2)
CO2: 29 mmol/L (ref 20–29)
Calcium: 9.5 mg/dL (ref 8.6–10.2)
Chloride: 94 mmol/L — ABNORMAL LOW (ref 96–106)
Creatinine, Ser: 0.77 mg/dL (ref 0.76–1.27)
Globulin, Total: 2.9 g/dL (ref 1.5–4.5)
Glucose: 188 mg/dL — ABNORMAL HIGH (ref 70–99)
Potassium: 4.4 mmol/L (ref 3.5–5.2)
Sodium: 140 mmol/L (ref 134–144)
Total Protein: 7.1 g/dL (ref 6.0–8.5)
eGFR: 102 mL/min/{1.73_m2} (ref >59)

## 2022-10-08 LAB — ALPHA-GAL PANEL
Allergen Lamb IgE: 43.4 kU/L — AB
Beef IgE: 40.1 kU/L — AB
IgE (Immunoglobulin E), Serum: 125 IU/mL (ref 6–495)
O215-IgE Alpha-Gal: 65.1 kU/L — AB
Pork IgE: 26.8 kU/L — AB

## 2022-10-08 LAB — LIPID PANEL
Chol/HDL Ratio: 7.3 ratio — ABNORMAL HIGH (ref 0.0–5.0)
Cholesterol, Total: 301 mg/dL — ABNORMAL HIGH (ref 100–199)
HDL: 41 mg/dL (ref >39)
LDL Chol Calc (NIH): 198 mg/dL — ABNORMAL HIGH (ref 0–99)
Triglycerides: 310 mg/dL — ABNORMAL HIGH (ref 0–149)
VLDL Cholesterol Cal: 62 mg/dL — ABNORMAL HIGH (ref 5–40)

## 2022-10-08 LAB — HEMOGLOBIN A1C
Est. average glucose Bld gHb Est-mCnc: 154 mg/dL
Hgb A1c MFr Bld: 7 % — ABNORMAL HIGH (ref 4.8–5.6)

## 2022-10-21 ENCOUNTER — Encounter: Payer: Self-pay | Admitting: Family Medicine

## 2022-10-21 ENCOUNTER — Ambulatory Visit (INDEPENDENT_AMBULATORY_CARE_PROVIDER_SITE_OTHER): Payer: Medicaid Other | Admitting: Family Medicine

## 2022-10-21 VITALS — BP 128/75 | HR 96 | Ht 68.0 in | Wt 344.0 lb

## 2022-10-21 DIAGNOSIS — E118 Type 2 diabetes mellitus with unspecified complications: Secondary | ICD-10-CM | POA: Diagnosis not present

## 2022-10-21 DIAGNOSIS — M25511 Pain in right shoulder: Secondary | ICD-10-CM

## 2022-10-21 DIAGNOSIS — G8929 Other chronic pain: Secondary | ICD-10-CM

## 2022-10-21 DIAGNOSIS — M25519 Pain in unspecified shoulder: Secondary | ICD-10-CM | POA: Insufficient documentation

## 2022-10-21 DIAGNOSIS — R0602 Shortness of breath: Secondary | ICD-10-CM

## 2022-10-21 DIAGNOSIS — M25512 Pain in left shoulder: Secondary | ICD-10-CM

## 2022-10-21 DIAGNOSIS — J449 Chronic obstructive pulmonary disease, unspecified: Secondary | ICD-10-CM

## 2022-10-21 MED ORDER — SEMAGLUTIDE (1 MG/DOSE) 4 MG/3ML ~~LOC~~ SOPN: 1.0000 mg | PEN_INJECTOR | SUBCUTANEOUS | 1 refills | Status: DC

## 2022-10-21 MED ORDER — METFORMIN HCL 850 MG PO TABS: 850.0000 mg | ORAL_TABLET | Freq: Two times a day (BID) | ORAL | 1 refills | Status: DC

## 2022-10-21 MED ORDER — STIOLTO RESPIMAT 2.5-2.5 MCG/ACT IN AERS: 2.0000 | INHALATION_SPRAY | Freq: Every day | RESPIRATORY_TRACT | 3 refills | Status: DC

## 2022-10-21 MED ORDER — ALBUTEROL SULFATE HFA 108 (90 BASE) MCG/ACT IN AERS: 2.0000 | INHALATION_SPRAY | RESPIRATORY_TRACT | 11 refills | Status: DC | PRN

## 2022-10-21 NOTE — Progress Notes (Signed)
Established Patient Office Visit  Subjective   Patient ID: Bryan Mitchell, male    DOB: 1961/01/10  Age: 62 y.o. MRN: [id number]  Chief Complaint  Patient presents with   Diabetes     Patient has stopped taking all his medications except for metformin and furosemide since her last visit.  He feels like his metoprolol was making him feel dizzy.  We discussed adding a medication for diabetes that would help him lose weight.  Patient initially did not want to take a injection but when we discussed it would be once a week and he would not need to adjust it every time like insulin he was okay with this.  We discussed starting a daily inhaler for COPD.  He is okay with this.  Discussed starting Stiolto.  He was educated on how often to use it.  Patient complaining of shoulder pain.  This has been chronic.  But has been acting up lately.  Wants to know what he can take for it.  We discussed the risk associated with NSAIDs including risk of heart attack and increased risk of bleeding, also discussed opioid medications and the risk of dependence.  Recommended that the only oral medication he would be appropriate for at this time is Tylenol.  Discussed the use of topical treatments and experimenting with different ones till he find one that works best for him.  He has some lidocaine patches at home.       ROS    Objective:     BP 128/75   Pulse 96   Ht 5\' 8"  (1.727 m)   Wt (!) 344 lb (156 kg)   SpO2 (!) 82% Comment: on RA  BMI 52.31 kg/m    Physical Exam Constitutional:      Appearance: Normal appearance. He is obese.  Pulmonary:     Effort: Pulmonary effort is normal.     Comments: On nasal cannula.  Desats when not on nasal cannula. Neurological:     Mental Status: He is alert.      No results found for any visits on 10/21/22.    The 10-year ASCVD risk score (Arnett DK, et al., 2019) is: 38.4%    Assessment & Plan:   Problem List Items Addressed This Visit        Respiratory   COPD (chronic obstructive pulmonary disease)    Starting Stiolto daily.  Refilling his albuterol.      Relevant Medications   Tiotropium Bromide-Olodaterol (STIOLTO RESPIMAT) 2.5-2.5 MCG/ACT AERS   albuterol (VENTOLIN HFA) 108 (90 Base) MCG/ACT inhaler     Endocrine   Type 2 diabetes mellitus with complication, without long-term current use of insulin - Primary    Continue metformin as once a day as patient will only take it once a day.  Adding semaglutide once weekly injectable.  Will discuss adjusting it at next visit.      Relevant Medications   Semaglutide, 1 MG/DOSE, 4 MG/3ML SOPN   metFORMIN (GLUCOPHAGE) 850 MG tablet     Other   Shoulder pain    Chronic shoulder pain has been worsening lately.  More so in the right side.  Worsens if he tries to use it such as getting in and out of a car.  Patient not a good candidate for NSAIDs or opioids to treat this as it is a chronic issue and he has issues with heart disease.  Discussed topicals.  Patient has multiple topicals at home.  Encouraged him to experiment  with different topicals to see which one works best as well as use topical heat as needed.  Weight loss should also help his chronic pain.      Other Visit Diagnoses     Shortness of breath       Relevant Medications   albuterol (VENTOLIN HFA) 108 (90 Base) MCG/ACT inhaler       Return in about 4 weeks (around 11/18/2022).    Sandre Kitty, MD

## 2022-10-21 NOTE — Patient Instructions (Addendum)
I have sent in refills for your albuterol and metformin.    I have sent in new prescriptions for your inhaler to use once a day and your injectable for diabetes once a week.    Please follow up in one month.    Dr. Constance Goltz

## 2022-10-21 NOTE — Assessment & Plan Note (Addendum)
Chronic shoulder pain has been worsening lately.  More so in the right side.  Worsens if he tries to use it such as getting in and out of a car.  Patient not a good candidate for NSAIDs or opioids to treat this as it is a chronic issue and he has issues with heart disease.  Discussed topicals.  Patient has multiple topicals at home.  Encouraged him to experiment with different topicals to see which one works best as well as use topical heat as needed.  Weight loss should also help his chronic pain.

## 2022-10-21 NOTE — Assessment & Plan Note (Signed)
Starting [employer] daily.  Refilling his albuterol.

## 2022-10-21 NOTE — Assessment & Plan Note (Signed)
Continue metformin as once a day as patient will only take it once a day.  Adding semaglutide once weekly injectable.  Will discuss adjusting it at next visit.

## 2022-10-30 ENCOUNTER — Encounter: Payer: Self-pay | Admitting: Family Medicine

## 2022-10-30 LAB — PSA: PSA: 0.78

## 2022-11-26 ENCOUNTER — Ambulatory Visit (INDEPENDENT_AMBULATORY_CARE_PROVIDER_SITE_OTHER): Payer: Medicaid Other | Admitting: Family Medicine

## 2022-11-26 ENCOUNTER — Encounter: Payer: Self-pay | Admitting: Family Medicine

## 2022-11-26 VITALS — BP 119/81 | HR 96 | Ht 68.0 in | Wt 341.1 lb

## 2022-11-26 DIAGNOSIS — E118 Type 2 diabetes mellitus with unspecified complications: Secondary | ICD-10-CM

## 2022-11-26 DIAGNOSIS — Z23 Encounter for immunization: Secondary | ICD-10-CM | POA: Diagnosis not present

## 2022-11-26 DIAGNOSIS — J449 Chronic obstructive pulmonary disease, unspecified: Secondary | ICD-10-CM

## 2022-11-26 DIAGNOSIS — E1169 Type 2 diabetes mellitus with other specified complication: Secondary | ICD-10-CM | POA: Diagnosis not present

## 2022-11-26 DIAGNOSIS — R0602 Shortness of breath: Secondary | ICD-10-CM

## 2022-11-26 DIAGNOSIS — E785 Hyperlipidemia, unspecified: Secondary | ICD-10-CM | POA: Diagnosis not present

## 2022-11-26 MED ORDER — EMPAGLIFLOZIN 10 MG PO TABS: 10.0000 mg | ORAL_TABLET | Freq: Every day | ORAL | 3 refills | Status: DC

## 2022-11-26 MED ORDER — ALBUTEROL SULFATE HFA 108 (90 BASE) MCG/ACT IN AERS
2.0000 | INHALATION_SPRAY | RESPIRATORY_TRACT | 11 refills | Status: DC | PRN
Start: 2022-11-26 — End: 2022-12-28

## 2022-11-26 MED ORDER — ATORVASTATIN CALCIUM 20 MG PO TABS
20.0000 mg | ORAL_TABLET | Freq: Every day | ORAL | 3 refills | Status: DC
Start: 2022-11-26 — End: 2022-12-28

## 2022-11-26 NOTE — Progress Notes (Signed)
   Established Patient Office Visit  Subjective   Patient ID: Bryan Mitchell, male    DOB: June 23, 1961  Age: 62 y.o. MRN: [id number]  Chief Complaint  Patient presents with   Medical Management of Chronic Issues    HPI  Copd -patient says he did not receive the Stiolto inhaler.  Has not been taking it.  We looked through his bag of medications including once he was not taking and found the unopened Stiolto.  I loaded it and showed him how to use it.  Patient used it with me in the room.  Advised him to use it daily.  Patient needed refill on albuterol.  Dm2 -patient says he tried the semaglutide 1 time and it made his "heart flutter".  Does not want to use the Ozempic anymore.  Willing to try an SGLT2 inhibitor after I discussed it with him.  Patient's sister is with him and she says that she knows someone else who takes that medication.  HLD -discussed importance of taking his statin with him to reduce his risk of heart attack or stroke.  Patient willing to take the statin at this time.  The 10-year ASCVD risk score (Arnett DK, et al., 2019) is: 34.7%     ROS    Objective:     BP 119/81   Pulse 96   Ht 5\' 8"  (1.727 m)   Wt (!) 341 lb 1.9 oz (154.7 kg)   SpO2 (!) 84%   BMI 51.87 kg/m    Physical Exam General: Alert, oriented HEENT: Eyes watery and sclera injected Pulmonary: Patient on supplemental oxygen.  No respiratory distress  No results found for any visits on 11/26/22.    The 10-year ASCVD risk score (Arnett DK, et al., 2019) is: 34.7%    Assessment & Plan:   Problem List Items Addressed This Visit       Respiratory   COPD (chronic obstructive pulmonary disease) (HCC)    Demonstrated how patient should use his Stiolto inhaler.  Patient use the inhaler in the room.  Refilled albuterol       Relevant Medications   albuterol (VENTOLIN HFA) 108 (90 Base) MCG/ACT inhaler     Endocrine   Hyperlipidemia associated with type 2 diabetes mellitus (HCC)     Discussed the importance of taking a statin.  Discussed his LDL levels.  Sending in prescription for atorvastatin.  Recheck in 6 weeks.      Relevant Medications   atorvastatin (LIPITOR) 20 MG tablet   empagliflozin (JARDIANCE) 10 MG TABS tablet   Type 2 diabetes mellitus with complication, without long-term current use of insulin (HCC)    Patient does not want to use semaglutide anymore.  Will try Jardiance.      Relevant Medications   atorvastatin (LIPITOR) 20 MG tablet   empagliflozin (JARDIANCE) 10 MG TABS tablet   Other Visit Diagnoses     Need for shingles vaccine    -  Primary   Relevant Orders   Varicella-zoster vaccine IM (Completed)   Shortness of breath       Relevant Medications   albuterol (VENTOLIN HFA) 108 (90 Base) MCG/ACT inhaler       Return in about 4 weeks (around 12/24/2022) for DM, COPD.    Sandre Kitty, MD

## 2022-11-26 NOTE — Patient Instructions (Signed)
It was nice to see you today,  We made the following changes: - Stop taking your semaglutide injection - I have sent in a prescription for a different medication for your diabetes.  It is a pill you take once a day.  If I need to we can resend it to a different pharmacy - I have refilled your albuterol inhaler - We showed you how to use your Stiolto inhaler.  Uses daily - You can pick up Zyrtec or the generic equivalent and Flonase (fluticasone nasal spray) over-the-counter at the pharmacy or grocery store.  These are for allergies. - We also sent in a prescription for your statin which will help reduce your risk of stroke or heart attack in the future  I would like to see back in 1 month  Have a great day,  Frederic Jericho, MD

## 2022-11-29 DIAGNOSIS — E1169 Type 2 diabetes mellitus with other specified complication: Secondary | ICD-10-CM | POA: Insufficient documentation

## 2022-11-29 NOTE — Assessment & Plan Note (Signed)
Discussed the importance of taking a statin.  Discussed his LDL levels.  Sending in prescription for atorvastatin.  Recheck in 6 weeks.

## 2022-11-29 NOTE — Assessment & Plan Note (Signed)
Patient does not want to use semaglutide anymore.  Will try Jardiance.

## 2022-11-29 NOTE — Assessment & Plan Note (Signed)
Demonstrated how patient should use his Stiolto inhaler.  Patient use the inhaler in the room.  Refilled albuterol

## 2022-12-19 ENCOUNTER — Other Ambulatory Visit: Payer: Self-pay

## 2022-12-19 ENCOUNTER — Inpatient Hospital Stay
Admission: EM | Admit: 2022-12-19 | Discharge: 2023-01-06 | DRG: 870 | Disposition: E | Payer: Medicaid Other | Attending: Internal Medicine | Admitting: Internal Medicine

## 2022-12-19 ENCOUNTER — Emergency Department: Payer: Medicaid Other

## 2022-12-19 DIAGNOSIS — Z515 Encounter for palliative care: Secondary | ICD-10-CM | POA: Diagnosis not present

## 2022-12-19 DIAGNOSIS — J440 Chronic obstructive pulmonary disease with acute lower respiratory infection: Secondary | ICD-10-CM | POA: Diagnosis present

## 2022-12-19 DIAGNOSIS — Z79899 Other long term (current) drug therapy: Secondary | ICD-10-CM

## 2022-12-19 DIAGNOSIS — I493 Ventricular premature depolarization: Secondary | ICD-10-CM | POA: Diagnosis not present

## 2022-12-19 DIAGNOSIS — I2489 Other forms of acute ischemic heart disease: Secondary | ICD-10-CM | POA: Diagnosis present

## 2022-12-19 DIAGNOSIS — R570 Cardiogenic shock: Secondary | ICD-10-CM | POA: Diagnosis not present

## 2022-12-19 DIAGNOSIS — J441 Chronic obstructive pulmonary disease with (acute) exacerbation: Secondary | ICD-10-CM | POA: Diagnosis present

## 2022-12-19 DIAGNOSIS — Z91148 Patient's other noncompliance with medication regimen for other reason: Secondary | ICD-10-CM

## 2022-12-19 DIAGNOSIS — J9600 Acute respiratory failure, unspecified whether with hypoxia or hypercapnia: Secondary | ICD-10-CM | POA: Diagnosis present

## 2022-12-19 DIAGNOSIS — Z8249 Family history of ischemic heart disease and other diseases of the circulatory system: Secondary | ICD-10-CM

## 2022-12-19 DIAGNOSIS — I358 Other nonrheumatic aortic valve disorders: Secondary | ICD-10-CM | POA: Diagnosis present

## 2022-12-19 DIAGNOSIS — A419 Sepsis, unspecified organism: Principal | ICD-10-CM | POA: Diagnosis present

## 2022-12-19 DIAGNOSIS — I272 Pulmonary hypertension, unspecified: Secondary | ICD-10-CM | POA: Diagnosis present

## 2022-12-19 DIAGNOSIS — J9611 Chronic respiratory failure with hypoxia: Secondary | ICD-10-CM | POA: Diagnosis present

## 2022-12-19 DIAGNOSIS — J9621 Acute and chronic respiratory failure with hypoxia: Secondary | ICD-10-CM | POA: Diagnosis present

## 2022-12-19 DIAGNOSIS — I509 Heart failure, unspecified: Secondary | ICD-10-CM

## 2022-12-19 DIAGNOSIS — R6521 Severe sepsis with septic shock: Secondary | ICD-10-CM | POA: Diagnosis present

## 2022-12-19 DIAGNOSIS — Z66 Do not resuscitate: Secondary | ICD-10-CM | POA: Diagnosis not present

## 2022-12-19 DIAGNOSIS — E8729 Other acidosis: Secondary | ICD-10-CM | POA: Diagnosis not present

## 2022-12-19 DIAGNOSIS — Z87892 Personal history of anaphylaxis: Secondary | ICD-10-CM

## 2022-12-19 DIAGNOSIS — G9341 Metabolic encephalopathy: Secondary | ICD-10-CM | POA: Diagnosis not present

## 2022-12-19 DIAGNOSIS — Z9981 Dependence on supplemental oxygen: Secondary | ICD-10-CM

## 2022-12-19 DIAGNOSIS — E875 Hyperkalemia: Secondary | ICD-10-CM | POA: Diagnosis present

## 2022-12-19 DIAGNOSIS — E785 Hyperlipidemia, unspecified: Secondary | ICD-10-CM | POA: Diagnosis present

## 2022-12-19 DIAGNOSIS — Z7984 Long term (current) use of oral hypoglycemic drugs: Secondary | ICD-10-CM

## 2022-12-19 DIAGNOSIS — J80 Acute respiratory distress syndrome: Secondary | ICD-10-CM | POA: Diagnosis present

## 2022-12-19 DIAGNOSIS — E871 Hypo-osmolality and hyponatremia: Secondary | ICD-10-CM | POA: Diagnosis not present

## 2022-12-19 DIAGNOSIS — Z1152 Encounter for screening for COVID-19: Secondary | ICD-10-CM | POA: Diagnosis not present

## 2022-12-19 DIAGNOSIS — E662 Morbid (severe) obesity with alveolar hypoventilation: Secondary | ICD-10-CM | POA: Diagnosis present

## 2022-12-19 DIAGNOSIS — J9622 Acute and chronic respiratory failure with hypercapnia: Secondary | ICD-10-CM | POA: Diagnosis present

## 2022-12-19 DIAGNOSIS — D696 Thrombocytopenia, unspecified: Secondary | ICD-10-CM | POA: Diagnosis present

## 2022-12-19 DIAGNOSIS — R0902 Hypoxemia: Secondary | ICD-10-CM

## 2022-12-19 DIAGNOSIS — Z7985 Long-term (current) use of injectable non-insulin antidiabetic drugs: Secondary | ICD-10-CM

## 2022-12-19 DIAGNOSIS — E1165 Type 2 diabetes mellitus with hyperglycemia: Secondary | ICD-10-CM | POA: Diagnosis not present

## 2022-12-19 DIAGNOSIS — I5033 Acute on chronic diastolic (congestive) heart failure: Secondary | ICD-10-CM | POA: Diagnosis present

## 2022-12-19 DIAGNOSIS — R0602 Shortness of breath: Principal | ICD-10-CM

## 2022-12-19 DIAGNOSIS — J189 Pneumonia, unspecified organism: Secondary | ICD-10-CM | POA: Diagnosis present

## 2022-12-19 DIAGNOSIS — E870 Hyperosmolality and hypernatremia: Secondary | ICD-10-CM | POA: Diagnosis not present

## 2022-12-19 DIAGNOSIS — E878 Other disorders of electrolyte and fluid balance, not elsewhere classified: Secondary | ICD-10-CM | POA: Diagnosis present

## 2022-12-19 DIAGNOSIS — Z91018 Allergy to other foods: Secondary | ICD-10-CM

## 2022-12-19 DIAGNOSIS — Z91014 Allergy to mammalian meats: Secondary | ICD-10-CM

## 2022-12-19 DIAGNOSIS — I110 Hypertensive heart disease with heart failure: Secondary | ICD-10-CM | POA: Diagnosis present

## 2022-12-19 DIAGNOSIS — Z91199 Patient's noncompliance with other medical treatment and regimen due to unspecified reason: Secondary | ICD-10-CM

## 2022-12-19 DIAGNOSIS — I428 Other cardiomyopathies: Secondary | ICD-10-CM | POA: Diagnosis not present

## 2022-12-19 DIAGNOSIS — I452 Bifascicular block: Secondary | ICD-10-CM | POA: Diagnosis present

## 2022-12-19 DIAGNOSIS — Z7982 Long term (current) use of aspirin: Secondary | ICD-10-CM

## 2022-12-19 DIAGNOSIS — N179 Acute kidney failure, unspecified: Secondary | ICD-10-CM | POA: Diagnosis not present

## 2022-12-19 DIAGNOSIS — R000 Tachycardia, unspecified: Secondary | ICD-10-CM | POA: Diagnosis not present

## 2022-12-19 DIAGNOSIS — F17290 Nicotine dependence, other tobacco product, uncomplicated: Secondary | ICD-10-CM | POA: Diagnosis present

## 2022-12-19 DIAGNOSIS — D751 Secondary polycythemia: Secondary | ICD-10-CM | POA: Diagnosis present

## 2022-12-19 DIAGNOSIS — T501X6A Underdosing of loop [high-ceiling] diuretics, initial encounter: Secondary | ICD-10-CM | POA: Diagnosis present

## 2022-12-19 DIAGNOSIS — A411 Sepsis due to other specified staphylococcus: Principal | ICD-10-CM | POA: Diagnosis present

## 2022-12-19 LAB — CBC
HCT: 64.3 % — ABNORMAL HIGH (ref 39.0–52.0)
Hemoglobin: 18.4 g/dL — ABNORMAL HIGH (ref 13.0–17.0)
MCH: 26.9 pg (ref 26.0–34.0)
MCHC: 28.6 g/dL — ABNORMAL LOW (ref 30.0–36.0)
MCV: 94 fL (ref 80.0–100.0)
Platelets: 145 10*3/uL — ABNORMAL LOW (ref 150–400)
RBC: 6.84 MIL/uL — ABNORMAL HIGH (ref 4.22–5.81)
RDW: 18.8 % — ABNORMAL HIGH (ref 11.5–15.5)
WBC: 6 10*3/uL (ref 4.0–10.5)
nRBC: 1.8 % — ABNORMAL HIGH (ref 0.0–0.2)

## 2022-12-19 LAB — URINALYSIS, COMPLETE (UACMP) WITH MICROSCOPIC
Bacteria, UA: NONE SEEN
Bilirubin Urine: NEGATIVE
Glucose, UA: NEGATIVE mg/dL
Ketones, ur: NEGATIVE mg/dL
Leukocytes,Ua: NEGATIVE
Nitrite: NEGATIVE
Protein, ur: 30 mg/dL — AB
Specific Gravity, Urine: 1.014 (ref 1.005–1.030)
pH: 5 (ref 5.0–8.0)

## 2022-12-19 LAB — COMPREHENSIVE METABOLIC PANEL
ALT: 15 U/L (ref 0–44)
AST: 23 U/L (ref 15–41)
Albumin: 3.8 g/dL (ref 3.5–5.0)
Alkaline Phosphatase: 79 U/L (ref 38–126)
Anion gap: 8 (ref 5–15)
BUN: 53 mg/dL — ABNORMAL HIGH (ref 6–20)
CO2: 32 mmol/L (ref 22–32)
Calcium: 9 mg/dL (ref 8.9–10.3)
Chloride: 97 mmol/L — ABNORMAL LOW (ref 98–111)
Creatinine, Ser: 1.26 mg/dL — ABNORMAL HIGH (ref 0.61–1.24)
GFR, Estimated: >60 mL/min (ref >60)
Glucose, Bld: 132 mg/dL — ABNORMAL HIGH (ref 70–99)
Potassium: 5.3 mmol/L — ABNORMAL HIGH (ref 3.5–5.1)
Sodium: 137 mmol/L (ref 135–145)
Total Bilirubin: 1.1 mg/dL (ref 0.3–1.2)
Total Protein: 7.4 g/dL (ref 6.5–8.1)

## 2022-12-19 LAB — TROPONIN I (HIGH SENSITIVITY)
Troponin I (High Sensitivity): 20 ng/L — ABNORMAL HIGH (ref <18)
Troponin I (High Sensitivity): 19 ng/L — ABNORMAL HIGH (ref <18)

## 2022-12-19 LAB — BRAIN NATRIURETIC PEPTIDE: B Natriuretic Peptide: 478.4 pg/mL — ABNORMAL HIGH (ref 0.0–100.0)

## 2022-12-19 MED ORDER — EPINEPHRINE 0.3 MG/0.3ML IJ SOAJ: 0.3000 mg | INTRAMUSCULAR | Status: DC | PRN

## 2022-12-19 MED ORDER — LIDOCAINE 5 % EX PTCH: 1.0000 | MEDICATED_PATCH | CUTANEOUS | Status: DC

## 2022-12-19 MED ORDER — FUROSEMIDE 10 MG/ML IJ SOLN
60.0000 mg | Freq: Once | INTRAMUSCULAR | Status: AC
  Administered 2022-12-19: 60 mg via INTRAVENOUS
  Filled 2022-12-19: qty 8

## 2022-12-19 MED ORDER — DOCUSATE SODIUM 100 MG PO CAPS: 100.0000 mg | ORAL_CAPSULE | Freq: Two times a day (BID) | ORAL | Status: DC | PRN

## 2022-12-19 MED ORDER — TRAZODONE HCL 50 MG PO TABS: 25.0000 mg | ORAL_TABLET | Freq: Every evening | ORAL | Status: DC | PRN

## 2022-12-19 MED ORDER — TAMSULOSIN HCL 0.4 MG PO CAPS: 0.4000 mg | ORAL_CAPSULE | Freq: Every day | ORAL | Status: DC

## 2022-12-19 MED ORDER — ASPIRIN 81 MG PO TBEC
81.0000 mg | DELAYED_RELEASE_TABLET | Freq: Every day | ORAL | Status: DC
  Administered 2022-12-20: 81 mg via ORAL
  Filled 2022-12-19: qty 1

## 2022-12-19 MED ORDER — ACETAMINOPHEN 325 MG PO TABS: 650.0000 mg | ORAL_TABLET | Freq: Four times a day (QID) | ORAL | Status: DC | PRN

## 2022-12-19 MED ORDER — FAMOTIDINE 20 MG PO TABS
20.0000 mg | ORAL_TABLET | Freq: Every day | ORAL | Status: DC
  Filled 2022-12-19: qty 1

## 2022-12-19 MED ORDER — LIDOCAINE 5 % EX PTCH: 1.0000 | MEDICATED_PATCH | Freq: Every day | CUTANEOUS | Status: DC | PRN

## 2022-12-19 MED ORDER — ATORVASTATIN CALCIUM 20 MG PO TABS: 20.0000 mg | ORAL_TABLET | Freq: Every day | ORAL | Status: DC

## 2022-12-19 MED ORDER — ENOXAPARIN SODIUM 80 MG/0.8ML IJ SOSY
80.0000 mg | PREFILLED_SYRINGE | INTRAMUSCULAR | Status: DC
  Administered 2022-12-20: 80 mg via SUBCUTANEOUS
  Filled 2022-12-19: qty 0.8

## 2022-12-19 MED ORDER — ALBUTEROL SULFATE HFA 108 (90 BASE) MCG/ACT IN AERS: 2.0000 | INHALATION_SPRAY | RESPIRATORY_TRACT | Status: DC | PRN

## 2022-12-19 MED ORDER — ALBUTEROL SULFATE (2.5 MG/3ML) 0.083% IN NEBU
2.5000 mg | INHALATION_SOLUTION | RESPIRATORY_TRACT | Status: DC | PRN
  Administered 2022-12-25: 2.5 mg via RESPIRATORY_TRACT
  Filled 2022-12-19: qty 3

## 2022-12-19 MED ORDER — ONDANSETRON HCL 4 MG PO TABS: 4.0000 mg | ORAL_TABLET | Freq: Four times a day (QID) | ORAL | Status: DC | PRN

## 2022-12-19 MED ORDER — ACETAMINOPHEN 325 MG RE SUPP: 650.0000 mg | Freq: Four times a day (QID) | RECTAL | Status: DC | PRN

## 2022-12-19 MED ORDER — MAGNESIUM HYDROXIDE 400 MG/5ML PO SUSP: 30.0000 mL | Freq: Every day | ORAL | Status: DC | PRN

## 2022-12-19 MED ORDER — ENOXAPARIN SODIUM 40 MG/0.4ML IJ SOSY
40.0000 mg | PREFILLED_SYRINGE | INTRAMUSCULAR | Status: DC
Start: 2022-12-19 — End: 2022-12-19

## 2022-12-19 MED ORDER — FUROSEMIDE 10 MG/ML IJ SOLN
40.0000 mg | Freq: Two times a day (BID) | INTRAMUSCULAR | Status: DC
  Administered 2022-12-20 – 2022-12-21 (×3): 40 mg via INTRAVENOUS
  Filled 2022-12-19 (×3): qty 4

## 2022-12-19 MED ORDER — ONDANSETRON HCL 4 MG/2ML IJ SOLN: 4.0000 mg | Freq: Four times a day (QID) | INTRAMUSCULAR | Status: DC | PRN

## 2022-12-19 NOTE — ED Provider Notes (Signed)
[facility] Provider Note    Event Date/Time   First MD Initiated Contact with Patient 12/19/22 2129     (approximate)  History   Chief Complaint: Shortness of Breath  HPI  Bryan Mitchell is a 62 y.o. male with a past medical history of CHF, COPD, hypertension, hyperlipidemia on 5 L chronically who presents to the emergency department for shortness of breath.  According to EMS they were called out to the patient's residence for shortness of breath.  Noted the patient to be satting 80% on 5 L nasal cannula.  Placed on a nonrebreather mask and brought to the emergency department.  Upon arrival patient satting around 90% on nonrebreather mask.  Patient is answering questions appropriately following commands.  Patient states shortness of breath but is feeling better than he did at home.  Patient admits he has not been taking any of his Lasix and he has had increased swelling of the legs as well as occasional cough but denies any fever.  Physical Exam   Triage Vital Signs: ED Triage Vitals [12/19/22 2135]  Enc Vitals Group     BP      Pulse      Resp      Temp      Temp src      SpO2      Weight      Height      Head Circumference      Peak Flow      Pain Score 0     Pain Loc      Pain Edu?      Excl. in GC?     Most recent vital signs: There were no vitals filed for this visit.  General: Awake, no distress.  CV:  Good peripheral perfusion.  Regular rate and rhythm  Resp:  Normal effort.  Equal but diminished breath sounds bilaterally with no obvious wheeze rales or rhonchi Abd:  No distention.  Soft, nontender.  No rebound or guarding. Other:  Moderate lower extremity IMA bilaterally.  No weeping.   ED Results / Procedures / Treatments   EKG  EKG viewed and interpreted by myself shows sinus tachycardia 104 bpm with a slightly widened QRS, right axis deviation, largely normal intervals with nonspecific ST changes no ST  elevation.  RADIOLOGY  I reviewed and interpreted chest x-ray images.  Patient appears to have bilateral pulmonary edema on my evaluation.  Also appears to have significant cardiomegaly. Radiology has read the x-ray as cardiac enlargement pulmonary vascular congestion perihilar edema and probable effusions.   MEDICATIONS ORDERED IN ED: Medications - No data to display   IMPRESSION / MDM / ASSESSMENT AND PLAN / ED COURSE  I reviewed the triage vital signs and the nursing notes.  Patient's presentation is most consistent with acute presentation with potential threat to life or bodily function.  Patient presents emergency department for worsening shortness of breath over the last few days acutely worse tonight.  Patient satting 80% on his typical 5 L per EMS when they arrived.  Patient satting 90% on a nonrebreather mask in the emergency department.  Will place on BiPAP.  Patient does have lower extremity IMA.  Differential would include CHF exacerbation/pulmonary edema, pneumonia, COPD exacerbation although no wheeze on exam.  We will check labs, chest x-ray, placed on BiPAP and continue to closely monitor while awaiting results.  Patient agreeable to plan of care.  I did discuss intubation if absolutely needed patient is agreeable  to intubation although not needed currently.  Chest x-ray is consistent with pulmonary edema.  Patient's BNP is elevated to 478 troponin slightly elevated suspect more demand ischemia.  Chemistry overall reassuring slight hyperkalemia.  We will dose IV Lasix.  CBC shows no concerning findings mild hemoconcentration.  Patient will be admitted to the hospitalist service for further workup treatment IV diuresis.  Patient agreeable to plan of care.    CRITICAL CARE Performed by: Minna Antis   Total critical care time: 30 minutes  Critical care time was exclusive of separately billable procedures and treating other patients.  Critical care was necessary to  treat or prevent imminent or life-threatening deterioration.  Critical care was time spent personally by me on the following activities: development of treatment plan with patient and/or surrogate as well as nursing, discussions with consultants, evaluation of patient's response to treatment, examination of patient, obtaining history from patient or surrogate, ordering and performing treatments and interventions, ordering and review of laboratory studies, ordering and review of radiographic studies, pulse oximetry and re-evaluation of patient's condition.   FINAL CLINICAL IMPRESSION(S) / ED DIAGNOSES   CHF exacerbation Dyspnea Hypoxia Respiratory failure secondary to CHF exacerbation   Note:  This document was prepared using Dragon voice recognition software and may include unintentional dictation errors.   Minna Antis, MD 12/19/22 [phone]

## 2022-12-19 NOTE — Progress Notes (Signed)
PHARMACIST - PHYSICIAN COMMUNICATION  CONCERNING:  Enoxaparin (Lovenox) for DVT Prophylaxis    RECOMMENDATION: Patient was prescribed enoxaprin 40mg  q24 hours for VTE prophylaxis.   Filed Weights   12/19/22 2148  Weight: (!) 163.4 kg (360 lb 4.8 oz)    Body mass index is 54.78 kg/m.  Estimated Creatinine Clearance: 93.8 mL/min (A) (by C-G formula based on SCr of 1.26 mg/dL (H)).   Based on [facility] policy patient is candidate for enoxaparin 0.5mg /kg TBW SQ every 24 hours based on BMI being >30.  DESCRIPTION: Pharmacy has adjusted enoxaparin dose per [facility] policy.  Patient is now receiving enoxaparin 0.5 mg/kg every 24 hours   Otelia Sergeant, PharmD, [facility] 12/19/2022 11:49 PM

## 2022-12-19 NOTE — H&P (Addendum)
[place]   PATIENT NAME: Bryan Mitchell    MR#:  [id number]  DATE OF BIRTH:  13-Feb-1961  DATE OF ADMISSION:  12/19/2022  PRIMARY CARE PHYSICIAN: Sandre Kitty, MD   Patient is coming from: Home  REQUESTING/REFERRING PHYSICIAN: Minna Antis, MD  CHIEF COMPLAINT:   Chief Complaint  Patient presents with   Shortness of Breath    HISTORY OF PRESENT ILLNESS:  Bryan Mitchell is a 62 y.o. obese Caucasian male with medical history significant for HFpEF, COPD, hypertension polycythemia, who presented to emergency room with acute onset of worsening dyspnea over the last week with associated orthopnea and paroxysmal nocturnal dyspnea, worsening lower extremity edema and dyspnea on exertion.  The patient has not been taking his Lasix on a regular basis.  He is on home O2 at 5 L/min and was satting at 80% on his home O2.  He did not have any reported fever or chills.  The history was obtained by his sister who lives 10 minutes from him as the patient was fairly somnolent and less responsive on BiPAP.  ED Course: When he came to the ER, heart rate was 102 with respiratory rate of 27, pulse oximetry was 94% on 100% nonrebreather and later 96% on BiPAP with 60% FiO2.  Labs revealed mild hyperkalemia 5.3 with hypochloremia of 97, BUN of 53 and creatinine 1.26 compared to 21/0.77 on 10/02/2022.  BNP was 470.4 and high sensitive troponin I was 20 and later on 19.  CBC showed hemoconcentration with hemoglobin 18.1 and hematocrit 64.3 and mild thrombocytopenia 145.  UA was remarkable for 30 protein and specific gravity 1014. EKG as reviewed by me : EKG showed sinus tachycardia with a rate of 104 with probable left atrial enlargement, right bundle branch block and left posterior fascicular block Imaging: Portable chest x-ray showed cardiomegaly with pulmonary vascular congestion and perihilar edema with probable pleural effusions.  The patient was given 80 mg of IV Lasix by EMS was placed on  BiPAP in the ER.  He was ordered 60 mg of IV Lasix.  He will be admitted to a progressive unit bed for further evaluation and management. PAST MEDICAL HISTORY:   Past Medical History:  Diagnosis Date   (HFpEF) heart failure with preserved ejection fraction (HCC)    Allergic reaction 12/15/2016   COPD (chronic obstructive pulmonary disease) (HCC)    Hyperglycemia 12/2016   Hypertension    Polycythemia     PAST SURGICAL HISTORY:   Past Surgical History:  Procedure Laterality Date   KNEE SURGERY Right     SOCIAL HISTORY:   Social History   Tobacco Use   Smoking status: Every Day    Types: Cigars   Smokeless tobacco: Never  Substance Use Topics   Alcohol use: Yes    FAMILY HISTORY:   Family History  Problem Relation Age of Onset   CAD Father    CAD Brother     DRUG ALLERGIES:   Allergies  Allergen Reactions   Other Anaphylaxis    McCormick's Rub (Food)   Beef-Derived Products Itching    Alpha gal allergy   Pork-Derived Products Hives    Alpha gal    REVIEW OF SYSTEMS:   ROS As per history of present illness. All pertinent systems were reviewed above. Constitutional, HEENT, cardiovascular, respiratory, GI, GU, musculoskeletal, neuro, psychiatric, endocrine, integumentary and hematologic systems were reviewed and are otherwise negative/unremarkable except for positive findings mentioned above in the HPI.   MEDICATIONS  AT HOME:   Prior to Admission medications   Medication Sig Start Date End Date Taking? Authorizing Provider  acetaminophen (TYLENOL) 325 MG tablet Take 2 tablets (650 mg total) by mouth every 4 (four) hours as needed for headache or mild pain. 04/12/22  Yes Esaw Grandchild A, DO  albuterol (VENTOLIN HFA) 108 (90 Base) MCG/ACT inhaler Inhale 2 puffs into the lungs every 4 (four) hours as needed for wheezing or shortness of breath. 11/26/22  Yes Sandre Kitty, MD  atorvastatin (LIPITOR) 20 MG tablet Take 1 tablet (20 mg total) by mouth daily.  11/26/22  Yes Sandre Kitty, MD  docusate sodium (COLACE) 100 MG capsule Take 1 capsule (100 mg total) by mouth 2 (two) times daily as needed for mild constipation. 04/12/22  Yes Esaw Grandchild A, DO  furosemide (LASIX) 40 MG tablet Take 80 mg by mouth daily. 11/29/22  Yes [provider]  LIDODERM 5 % Place 1 patch onto the skin daily. 07/22/22  Yes [provider]  metFORMIN (GLUCOPHAGE) 850 MG tablet Take 1 tablet (850 mg total) by mouth 2 (two) times daily with a meal. 10/21/22  Yes Sandre Kitty, MD  Semaglutide, 1 MG/DOSE, 4 MG/3ML SOPN Inject 1 mg as directed once a week. 10/21/22  Yes Sandre Kitty, MD  Tiotropium Bromide-Olodaterol (STIOLTO RESPIMAT) 2.5-2.5 MCG/ACT AERS Inhale 2 puffs into the lungs daily. 10/21/22  Yes Sandre Kitty, MD  Vitamin D, Ergocalciferol, (DRISDOL) 1.25 MG (50000 UNIT) CAPS capsule Take 50,000 Units by mouth once a week. 10/08/22  Yes [provider]  aspirin 81 MG tablet Take 81 mg by mouth daily. Patient not taking: Reported on 10/21/2022    [provider]  atorvastatin (LIPITOR) 10 MG tablet Take 10 mg by mouth at bedtime. Patient not taking: Reported on 12/19/2022 07/22/22   [provider]  blood glucose meter kit and supplies Dispense based on patient and insurance preference. Use up to four times daily as directed. (FOR ICD-10 E10.9, E11.9). 01/15/18   Rodolph Bong, MD  diclofenac Sodium (VOLTAREN) 1 % GEL Apply 2 g topically 3 (three) times daily as needed (pain). Use on knees and other areas of pain Patient not taking: Reported on 10/21/2022 04/12/22   Esaw Grandchild A, DO  empagliflozin (JARDIANCE) 10 MG TABS tablet Take 1 tablet (10 mg total) by mouth daily before breakfast. Patient not taking: Reported on 12/19/2022 11/26/22   Sandre Kitty, MD  EPINEPHrine 0.3 mg/0.3 mL IJ SOAJ injection Inject 0.3 mg into the muscle as needed for anaphylaxis. 09/14/22   Corena Herter, MD  famotidine (PEPCID) 20 MG  tablet Take 1 tablet (20 mg total) by mouth daily for 5 days. 09/14/22 09/19/22  Corena Herter, MD  feeding supplement (ENSURE ENLIVE / ENSURE PLUS) LIQD Take 237 mLs by mouth 3 (three) times daily between meals. Patient not taking: Reported on 10/21/2022 04/12/22   Esaw Grandchild A, DO  furosemide (LASIX) 20 MG tablet Take 3 tablets (60 mg total) by mouth 2 (two) times daily. Patient not taking: Reported on 12/19/2022 04/12/22   Esaw Grandchild A, DO  gabapentin (NEURONTIN) 100 MG capsule Take 2 capsules (200 mg total) by mouth 3 (three) times daily. Patient not taking: Reported on 10/21/2022 04/12/22   Esaw Grandchild A, DO  metoprolol tartrate (LOPRESSOR) 25 MG tablet Take 0.5 tablets (12.5 mg total) by mouth 2 (two) times daily. Patient not taking: Reported on 10/21/2022 04/12/22   Pennie Banter, DO  Multiple  Vitamin (MULTIVITAMIN WITH MINERALS) TABS tablet Take 1 tablet by mouth daily. Patient not taking: Reported on 10/21/2022 04/13/22   Esaw Grandchild A, DO  oxyCODONE-acetaminophen (PERCOCET/ROXICET) 5-325 MG tablet Take 1 tablet by mouth every 6 (six) hours as needed for severe pain. Patient not taking: Reported on 10/21/2022 04/12/22   Esaw Grandchild A, DO  tamsulosin (FLOMAX) 0.4 MG CAPS capsule Take 1 capsule (0.4 mg total) by mouth daily. Patient not taking: Reported on 10/21/2022 04/13/22   Esaw Grandchild A, DO      VITAL SIGNS:  Blood pressure 99/61, pulse 85, resp. rate 15, height 5\' 8"  (1.727 m), weight (!) 163.4 kg, SpO2 91 %.  PHYSICAL EXAMINATION:  Physical Exam  GENERAL: Acutely ill 62 y.o.-year-old obese Caucasian male patient lying in the bed in mild respiratory distress on BiPAP.  He was very somnolent and difficult to arouse. EYES: Pupils equal, round, reactive to light and accommodation. No scleral icterus. Extraocular muscles intact.  HEENT: Head atraumatic, normocephalic. Oropharynx and nasopharynx clear.  NECK:  Supple, no jugular venous distention. No thyroid  enlargement, no tenderness.  LUNGS: Diminished basal breath sounds with bibasilar rales.  No use of accessory muscles of respiration.  CARDIOVASCULAR: Regular rate and rhythm, S1, S2 normal. No murmurs, rubs, or gallops.  ABDOMEN: Soft, nondistended, nontender. Bowel sounds present. No organomegaly or mass.  EXTREMITIES: 1-2+ bilateral lower extremity pitting edema with no cyanosis, or clubbing.  NEUROLOGIC: Cranial nerves II through XII are intact. Muscle strength 5/5 in all extremities. Sensation intact. Gait not checked.  PSYCHIATRIC: The patient is alert and oriented x 3.  Normal affect and good eye contact. SKIN: No obvious rash, lesion, or ulcer.   LABORATORY PANEL:   CBC Recent Labs  Lab 12/19/22 2142  WBC 6.0  HGB 18.4*  HCT 64.3*  PLT 145*   ------------------------------------------------------------------------------------------------------------------  Chemistries  Recent Labs  Lab 12/19/22 2142  NA 137  K 5.3*  CL 97*  CO2 32  GLUCOSE 132*  BUN 53*  CREATININE 1.26*  CALCIUM 9.0  AST 23  ALT 15  ALKPHOS 79  BILITOT 1.1   ------------------------------------------------------------------------------------------------------------------  Cardiac Enzymes No results for input(s): "TROPONINI" in the last 168 hours. ------------------------------------------------------------------------------------------------------------------  RADIOLOGY:  DG Chest Portable 1 View  Result Date: 12/19/2022 CLINICAL DATA:  Shortness of breath EXAM: PORTABLE CHEST 1 VIEW COMPARISON:  03/12/2022 FINDINGS: Shallow inspiration. Cardiac enlargement with pulmonary vascular congestion and bilateral perihilar infiltrates, likely edema. Probable small pleural effusions. No pneumothorax. Mediastinal contours appear intact. IMPRESSION: Cardiac enlargement with pulmonary vascular congestion, perihilar edema, and probable effusions. Electronically Signed   By: Burman Nieves M.D.   On:  12/19/2022 22:10      IMPRESSION AND PLAN:  Assessment and Plan: * Acute on chronic respiratory failure with hypoxia (HCC) - The patient will be admitted to a progressive unit bed. - This is clearly secondary to his acute CHF. - We will continue the patient on BiPAP. - We will check his ABG. - O2 protocol will be followed. - We will manage his acute CHF as below.  Acute on chronic diastolic (congestive) heart failure (HCC) - This is clearly secondary to noncompliance. - We will continue diuresis with IV Lasix. - We Will follow serial troponins. - We will follow I's and O's and daily weights.    AKI (acute kidney injury) (HCC) - This is likely prerenal secondary to renal hypoperfusion from acute CHF. - This is associated with mild hyperkalemia that should correct with diuresis. - Will  follow BMP with diuresis. - We will avoid nephrotoxins.   DVT prophylaxis: Lovenox.  Advanced Care Planning:  Code Status: full code.  Family Communication:  The plan of care was discussed in details with the patient (and family). I answered all questions. The patient agreed to proceed with the above mentioned plan. Further management will depend upon hospital course. Disposition Plan: Back to previous home environment Consults called: none.  All the records are reviewed and case discussed with ED provider.  Status is: Inpatient   At the time of the admission, it appears that the appropriate admission status for this patient is inpatient.  This is judged to be reasonable and necessary in order to provide the required intensity of service to ensure the patient's safety given the presenting symptoms, physical exam findings and initial radiographic and laboratory data in the context of comorbid conditions.  The patient requires inpatient status due to high intensity of service, high risk of further deterioration and high frequency of surveillance required.  I certify that at the time of admission,  it is my clinical judgment that the patient will require inpatient hospital care extending more than 2 midnights.                            Dispo: The patient is from: Home              Anticipated d/c is to: Home              Patient currently is not medically stable to d/c.              Difficult to place patient: No Authorized and performed by: Valente David, MD  Total critical care time:   45     minutes. Due to a high probability of clinically significant, life-threatening deterioration, the patient required my highest level of preparedness to intervene emergently and I personally spent this critical care time directly and personally managing the patient.  This critical care time included obtaining a history, examining the patient, pulse oximetry, ordering and review of studies, arranging urgent treatment with development of management plan, evaluation of patient's response to treatment, frequent reassessment, and discussions with other providers. This critical care time was performed to assess and manage the high probability of imminent, life-threatening deterioration that could result in multiorgan failure.  It was exclusive of separately billable procedures and treating other patients and teaching time.   Hannah Beat M.D on 12/20/2022 at 12:23 AM  [facility]   From 7 PM-7 AM, contact night-coverage [email or web address]  CC: Primary care physician; Sandre Kitty, MD

## 2022-12-19 NOTE — ED Notes (Signed)
Pt ripped off Bipap mask and yelling he needed to urinate. Gave patient urinal and he voided with assistance. Pt Bipap machine still alarming after putting mask back on patient called respiratory to come look at machine and fix alarming. Respiratory at bedside now.

## 2022-12-19 NOTE — ED Notes (Addendum)
At this time respiratory at bedside putting patient on BiPap machine.

## 2022-12-19 NOTE — ED Triage Notes (Signed)
Pt came in via EMS. Pt normally wears 5L O2 at home and per wife has been at 80% for last 3 days. Pt has been not compliant with taking lasix medication and wife gave 80mg  of lasix before EMS arrived on scene. EMS put patient on 15 L NRB and pt went up to 96%. Pt has hx of COPD, CHF and diabetes.  EMS vitals: BP 134/73, HR 151, CBG 115

## 2022-12-20 ENCOUNTER — Inpatient Hospital Stay: Payer: Medicaid Other

## 2022-12-20 DIAGNOSIS — N179 Acute kidney failure, unspecified: Secondary | ICD-10-CM

## 2022-12-20 DIAGNOSIS — J9621 Acute and chronic respiratory failure with hypoxia: Secondary | ICD-10-CM | POA: Diagnosis not present

## 2022-12-20 DIAGNOSIS — J9600 Acute respiratory failure, unspecified whether with hypoxia or hypercapnia: Secondary | ICD-10-CM | POA: Diagnosis present

## 2022-12-20 DIAGNOSIS — E875 Hyperkalemia: Secondary | ICD-10-CM

## 2022-12-20 LAB — BASIC METABOLIC PANEL
Anion gap: 9 (ref 5–15)
BUN: 51 mg/dL — ABNORMAL HIGH (ref 6–20)
CO2: 30 mmol/L (ref 22–32)
Calcium: 9 mg/dL (ref 8.9–10.3)
Chloride: 99 mmol/L (ref 98–111)
Creatinine, Ser: 1.26 mg/dL — ABNORMAL HIGH (ref 0.61–1.24)
GFR, Estimated: >60 mL/min (ref >60)
Glucose, Bld: 123 mg/dL — ABNORMAL HIGH (ref 70–99)
Potassium: 4.8 mmol/L (ref 3.5–5.1)
Sodium: 138 mmol/L (ref 135–145)

## 2022-12-20 LAB — BLOOD GAS, ARTERIAL
Acid-Base Excess: 0.9 mmol/L (ref 0.0–2.0)
Acid-Base Excess: 3.8 mmol/L — ABNORMAL HIGH (ref 0.0–2.0)
Acid-Base Excess: 5.8 mmol/L — ABNORMAL HIGH (ref 0.0–2.0)
Acid-Base Excess: 6 mmol/L — ABNORMAL HIGH (ref 0.0–2.0)
Bicarbonate: 32.4 mmol/L — ABNORMAL HIGH (ref 20.0–28.0)
Bicarbonate: 36 mmol/L — ABNORMAL HIGH (ref 20.0–28.0)
Bicarbonate: 32.8 mmol/L — ABNORMAL HIGH (ref 20.0–28.0)
Bicarbonate: 31.9 mmol/L — ABNORMAL HIGH (ref 20.0–28.0)
Delivery systems: POSITIVE
Expiratory PAP: 8 cmH2O
Expiratory PAP: 8 cmH2O
FIO2: 60 %
FIO2: 50 %
FIO2: 90 %
FIO2: 90 %
Inspiratory PAP: 18 cmH2O
MECHVT: 600 mL
MECHVT: 450 mL
Mode: POSITIVE
O2 Saturation: 94.2 %
O2 Saturation: 92.4 %
O2 Saturation: 96.9 %
O2 Saturation: 96.7 %
PEEP: 8 cmH2O
PEEP: 8 cmH2O
Patient temperature: 37
Patient temperature: 37
Patient temperature: 37
Patient temperature: 37
RATE: 24 resp/min
RATE: 22 resp/min
RATE: 28 resp/min
pCO2 arterial: 91 mmHg (ref 32–48)
pCO2 arterial: 101 mmHg (ref 32–48)
pCO2 arterial: 53 mmHg — ABNORMAL HIGH (ref 32–48)
pCO2 arterial: 48 mmHg (ref 32–48)
pH, Arterial: 7.16 — CL (ref 7.35–7.45)
pH, Arterial: 7.16 — CL (ref 7.35–7.45)
pH, Arterial: 7.4 (ref 7.35–7.45)
pH, Arterial: 7.43 (ref 7.35–7.45)
pO2, Arterial: 77 mmHg — ABNORMAL LOW (ref 83–108)
pO2, Arterial: 68 mmHg — ABNORMAL LOW (ref 83–108)
pO2, Arterial: 77 mmHg — ABNORMAL LOW (ref 83–108)
pO2, Arterial: 77 mmHg — ABNORMAL LOW (ref 83–108)

## 2022-12-20 LAB — MRSA NEXT GEN BY PCR, NASAL: MRSA by PCR Next Gen: NOT DETECTED

## 2022-12-20 LAB — GLUCOSE, CAPILLARY
Glucose-Capillary: 118 mg/dL — ABNORMAL HIGH (ref 70–99)
Glucose-Capillary: 133 mg/dL — ABNORMAL HIGH (ref 70–99)
Glucose-Capillary: 139 mg/dL — ABNORMAL HIGH (ref 70–99)
Glucose-Capillary: 147 mg/dL — ABNORMAL HIGH (ref 70–99)
Glucose-Capillary: 150 mg/dL — ABNORMAL HIGH (ref 70–99)
Glucose-Capillary: 165 mg/dL — ABNORMAL HIGH (ref 70–99)

## 2022-12-20 LAB — MAGNESIUM: Magnesium: 2.3 mg/dL (ref 1.7–2.4)

## 2022-12-20 LAB — CBC
HCT: 66.8 % — ABNORMAL HIGH (ref 39.0–52.0)
Hemoglobin: 18.8 g/dL — ABNORMAL HIGH (ref 13.0–17.0)
MCH: 26.9 pg (ref 26.0–34.0)
MCHC: 28.1 g/dL — ABNORMAL LOW (ref 30.0–36.0)
MCV: 95.4 fL (ref 80.0–100.0)
Platelets: 116 10*3/uL — ABNORMAL LOW (ref 150–400)
RBC: 7 MIL/uL — ABNORMAL HIGH (ref 4.22–5.81)
RDW: 19.1 % — ABNORMAL HIGH (ref 11.5–15.5)
WBC: 5.7 10*3/uL (ref 4.0–10.5)
nRBC: 2.3 % — ABNORMAL HIGH (ref 0.0–0.2)

## 2022-12-20 LAB — PROTIME-INR
INR: 1.2 (ref 0.8–1.2)
Prothrombin Time: 15.2 seconds (ref 11.4–15.2)

## 2022-12-20 LAB — PHOSPHORUS: Phosphorus: 6.8 mg/dL — ABNORMAL HIGH (ref 2.5–4.6)

## 2022-12-20 LAB — PROCALCITONIN: Procalcitonin: 0.62 ng/mL

## 2022-12-20 LAB — SARS CORONAVIRUS 2 BY RT PCR: SARS Coronavirus 2 by RT PCR: NEGATIVE

## 2022-12-20 MED ORDER — SODIUM CHLORIDE 0.9 % IV SOLN
1.0000 g | INTRAVENOUS | Status: DC
  Administered 2022-12-20: 1 g via INTRAVENOUS
  Filled 2022-12-20 (×3): qty 10

## 2022-12-20 MED ORDER — PROPOFOL 1000 MG/100ML IV EMUL
INTRAVENOUS | Status: AC
  Filled 2022-12-20: qty 100

## 2022-12-20 MED ORDER — CHLORHEXIDINE GLUCONATE CLOTH 2 % EX PADS
6.0000 | MEDICATED_PAD | Freq: Every day | CUTANEOUS | Status: DC
  Administered 2022-12-20 – 2022-12-28 (×9): 6 via TOPICAL

## 2022-12-20 MED ORDER — FENTANYL BOLUS VIA INFUSION
50.0000 ug | INTRAVENOUS | Status: DC | PRN
  Administered 2022-12-20: 75 ug via INTRAVENOUS
  Administered 2022-12-21 (×2): 50 ug via INTRAVENOUS

## 2022-12-20 MED ORDER — DEXMEDETOMIDINE HCL IN NACL 400 MCG/100ML IV SOLN
0.0000 ug/kg/h | INTRAVENOUS | Status: DC
  Administered 2022-12-20 (×2): 0.8 ug/kg/h via INTRAVENOUS
  Administered 2022-12-20 (×2): 1.1 ug/kg/h via INTRAVENOUS
  Administered 2022-12-20: 0.8 ug/kg/h via INTRAVENOUS
  Administered 2022-12-20: 0.4 ug/kg/h via INTRAVENOUS
  Administered 2022-12-20: 1 ug/kg/h via INTRAVENOUS
  Administered 2022-12-21: 1.2 ug/kg/h via INTRAVENOUS
  Administered 2022-12-21: 1 ug/kg/h via INTRAVENOUS
  Administered 2022-12-21 – 2022-12-22 (×11): 1.2 ug/kg/h via INTRAVENOUS
  Administered 2022-12-22: 1 ug/kg/h via INTRAVENOUS
  Filled 2022-12-20 (×21): qty 100

## 2022-12-20 MED ORDER — BUDESONIDE 0.25 MG/2ML IN SUSP
0.2500 mg | Freq: Two times a day (BID) | RESPIRATORY_TRACT | Status: DC
  Administered 2022-12-20 – 2022-12-23 (×7): 0.25 mg via RESPIRATORY_TRACT
  Filled 2022-12-20 (×7): qty 2

## 2022-12-20 MED ORDER — ACETAMINOPHEN 650 MG RE SUPP: 650.0000 mg | Freq: Four times a day (QID) | RECTAL | Status: DC | PRN

## 2022-12-20 MED ORDER — INSULIN ASPART 100 UNIT/ML IJ SOLN
0.0000 [IU] | INTRAMUSCULAR | Status: DC
  Administered 2022-12-20 (×4): 3 [IU] via SUBCUTANEOUS
  Administered 2022-12-21: 7 [IU] via SUBCUTANEOUS
  Administered 2022-12-21: 4 [IU] via SUBCUTANEOUS
  Administered 2022-12-21: 7 [IU] via SUBCUTANEOUS
  Administered 2022-12-21: 4 [IU] via SUBCUTANEOUS
  Administered 2022-12-21: 7 [IU] via SUBCUTANEOUS
  Administered 2022-12-21 – 2022-12-22 (×3): 4 [IU] via SUBCUTANEOUS
  Administered 2022-12-22: 3 [IU] via SUBCUTANEOUS
  Administered 2022-12-22: 7 [IU] via SUBCUTANEOUS
  Administered 2022-12-22 (×2): 4 [IU] via SUBCUTANEOUS
  Administered 2022-12-23: 7 [IU] via SUBCUTANEOUS
  Administered 2022-12-23: 4 [IU] via SUBCUTANEOUS
  Administered 2022-12-23: 3 [IU] via SUBCUTANEOUS
  Administered 2022-12-23 – 2022-12-24 (×3): 4 [IU] via SUBCUTANEOUS
  Administered 2022-12-24: 3 [IU] via SUBCUTANEOUS
  Administered 2022-12-24: 4 [IU] via SUBCUTANEOUS
  Administered 2022-12-24: 3 [IU] via SUBCUTANEOUS
  Administered 2022-12-24: 4 [IU] via SUBCUTANEOUS
  Administered 2022-12-25: 15 [IU] via SUBCUTANEOUS
  Administered 2022-12-25: 7 [IU] via SUBCUTANEOUS
  Administered 2022-12-25: 11 [IU] via SUBCUTANEOUS
  Administered 2022-12-25: 4 [IU] via SUBCUTANEOUS
  Administered 2022-12-25: 7 [IU] via SUBCUTANEOUS
  Administered 2022-12-25: 15 [IU] via SUBCUTANEOUS
  Administered 2022-12-26: 20 [IU] via SUBCUTANEOUS
  Filled 2022-12-20 (×31): qty 1

## 2022-12-20 MED ORDER — NOREPINEPHRINE 4 MG/250ML-% IV SOLN
INTRAVENOUS | Status: AC
  Filled 2022-12-20: qty 250

## 2022-12-20 MED ORDER — KETAMINE HCL 10 MG/ML IJ SOLN
INTRAMUSCULAR | Status: AC
  Filled 2022-12-20: qty 1

## 2022-12-20 MED ORDER — LORAZEPAM 2 MG/ML IJ SOLN
0.5000 mg | Freq: Once | INTRAMUSCULAR | Status: AC | PRN
  Administered 2022-12-20: 0.5 mg via INTRAVENOUS
  Filled 2022-12-20: qty 1

## 2022-12-20 MED ORDER — SODIUM CHLORIDE 0.9 % IV SOLN
500.0000 mg | INTRAVENOUS | Status: DC
  Administered 2022-12-20: 500 mg via INTRAVENOUS
  Filled 2022-12-20 (×3): qty 5

## 2022-12-20 MED ORDER — FREE WATER
30.0000 mL | Status: DC
  Administered 2022-12-20 – 2022-12-25 (×27): 30 mL

## 2022-12-20 MED ORDER — SODIUM CHLORIDE 0.9 % IV SOLN
250.0000 mL | INTRAVENOUS | Status: DC
  Administered 2022-12-20 – 2022-12-24 (×2): 250 mL via INTRAVENOUS

## 2022-12-20 MED ORDER — POLYETHYLENE GLYCOL 3350 17 G PO PACK
17.0000 g | PACK | Freq: Every day | ORAL | Status: DC
  Administered 2022-12-20 – 2022-12-28 (×9): 17 g
  Filled 2022-12-20 (×8): qty 1

## 2022-12-20 MED ORDER — MIDAZOLAM HCL 2 MG/2ML IJ SOLN
1.0000 mg | INTRAMUSCULAR | Status: DC | PRN
  Administered 2022-12-20 – 2022-12-22 (×9): 2 mg via INTRAVENOUS
  Filled 2022-12-20 (×11): qty 2

## 2022-12-20 MED ORDER — NOREPINEPHRINE 4 MG/250ML-% IV SOLN
2.0000 ug/min | INTRAVENOUS | Status: DC
  Administered 2022-12-20: 2 ug/min via INTRAVENOUS

## 2022-12-20 MED ORDER — SODIUM CHLORIDE 0.9 % IV SOLN
500.0000 mg | INTRAVENOUS | Status: DC
  Filled 2022-12-20 (×3): qty 5

## 2022-12-20 MED ORDER — FENTANYL CITRATE PF 50 MCG/ML IJ SOSY
50.0000 ug | PREFILLED_SYRINGE | Freq: Once | INTRAMUSCULAR | Status: AC
  Administered 2022-12-20: 50 ug via INTRAVENOUS
  Filled 2022-12-20: qty 1

## 2022-12-20 MED ORDER — IPRATROPIUM-ALBUTEROL 0.5-2.5 (3) MG/3ML IN SOLN
3.0000 mL | Freq: Four times a day (QID) | RESPIRATORY_TRACT | Status: DC
  Administered 2022-12-20 – 2022-12-28 (×33): 3 mL via RESPIRATORY_TRACT
  Filled 2022-12-20 (×34): qty 3

## 2022-12-20 MED ORDER — FONDAPARINUX SODIUM 2.5 MG/0.5ML ~~LOC~~ SOLN
2.5000 mg | SUBCUTANEOUS | Status: DC
  Administered 2022-12-21 – 2022-12-27 (×7): 2.5 mg via SUBCUTANEOUS
  Filled 2022-12-20 (×8): qty 0.5

## 2022-12-20 MED ORDER — ATORVASTATIN CALCIUM 20 MG PO TABS
20.0000 mg | ORAL_TABLET | Freq: Every day | ORAL | Status: DC
  Administered 2022-12-20 – 2022-12-28 (×9): 20 mg
  Filled 2022-12-20 (×9): qty 1

## 2022-12-20 MED ORDER — VITAL HIGH PROTEIN PO LIQD
1000.0000 mL | ORAL | Status: AC
  Administered 2022-12-20 – 2022-12-25 (×8): 1000 mL

## 2022-12-20 MED ORDER — ONDANSETRON HCL 4 MG/2ML IJ SOLN: 4.0000 mg | Freq: Four times a day (QID) | INTRAMUSCULAR | Status: DC | PRN

## 2022-12-20 MED ORDER — ORAL CARE MOUTH RINSE: 15.0000 mL | OROMUCOSAL | Status: DC | PRN

## 2022-12-20 MED ORDER — ASPIRIN 81 MG PO CHEW
81.0000 mg | CHEWABLE_TABLET | Freq: Every day | ORAL | Status: DC
  Administered 2022-12-21 – 2022-12-28 (×8): 81 mg
  Filled 2022-12-20 (×8): qty 1

## 2022-12-20 MED ORDER — TRAZODONE HCL 50 MG PO TABS: 25.0000 mg | ORAL_TABLET | Freq: Every evening | ORAL | Status: DC | PRN

## 2022-12-20 MED ORDER — METHYLPREDNISOLONE SODIUM SUCC 40 MG IJ SOLR
40.0000 mg | Freq: Two times a day (BID) | INTRAMUSCULAR | Status: DC
  Administered 2022-12-20 – 2022-12-21 (×3): 40 mg via INTRAVENOUS
  Filled 2022-12-20 (×3): qty 1

## 2022-12-20 MED ORDER — ROCURONIUM BROMIDE 10 MG/ML (PF) SYRINGE
PREFILLED_SYRINGE | INTRAVENOUS | Status: AC
  Administered 2022-12-20: 100 mg
  Filled 2022-12-20: qty 10

## 2022-12-20 MED ORDER — ORAL CARE MOUTH RINSE
15.0000 mL | OROMUCOSAL | Status: DC
  Administered 2022-12-20 – 2022-12-28 (×95): 15 mL via OROMUCOSAL
  Filled 2022-12-20 (×5): qty 15

## 2022-12-20 MED ORDER — ONDANSETRON HCL 4 MG PO TABS: 4.0000 mg | ORAL_TABLET | Freq: Four times a day (QID) | ORAL | Status: DC | PRN

## 2022-12-20 MED ORDER — FAMOTIDINE 20 MG PO TABS
20.0000 mg | ORAL_TABLET | Freq: Every day | ORAL | Status: DC
  Administered 2022-12-21 – 2022-12-23 (×3): 20 mg
  Filled 2022-12-20 (×3): qty 1

## 2022-12-20 MED ORDER — FENTANYL 2500MCG IN NS 250ML (10MCG/ML) PREMIX INFUSION
0.0000 ug/h | INTRAVENOUS | Status: DC
  Administered 2022-12-20: 50 ug/h via INTRAVENOUS
  Administered 2022-12-21 (×2): 400 ug/h via INTRAVENOUS
  Administered 2022-12-21: 200 ug/h via INTRAVENOUS
  Administered 2022-12-21 – 2022-12-24 (×9): 400 ug/h via INTRAVENOUS
  Administered 2022-12-24: 350 ug/h via INTRAVENOUS
  Administered 2022-12-24: 400 ug/h via INTRAVENOUS
  Administered 2022-12-25 – 2022-12-26 (×7): 350 ug/h via INTRAVENOUS
  Administered 2022-12-27: 300 ug/h via INTRAVENOUS
  Administered 2022-12-27: 325 ug/h via INTRAVENOUS
  Administered 2022-12-28: 50 ug/h via INTRAVENOUS
  Filled 2022-12-20 (×27): qty 250

## 2022-12-20 MED ORDER — DOCUSATE SODIUM 50 MG/5ML PO LIQD
100.0000 mg | Freq: Two times a day (BID) | ORAL | Status: DC
  Administered 2022-12-20 – 2022-12-28 (×17): 100 mg
  Filled 2022-12-20 (×16): qty 10

## 2022-12-20 MED ORDER — MAGNESIUM HYDROXIDE 400 MG/5ML PO SUSP: 30.0000 mL | Freq: Every day | ORAL | Status: DC | PRN

## 2022-12-20 MED ORDER — DOCUSATE SODIUM 50 MG/5ML PO LIQD
100.0000 mg | Freq: Two times a day (BID) | ORAL | Status: DC | PRN
  Administered 2022-12-27: 100 mg

## 2022-12-20 MED ORDER — ACETAMINOPHEN 325 MG PO TABS
650.0000 mg | ORAL_TABLET | Freq: Four times a day (QID) | ORAL | Status: DC | PRN
  Administered 2022-12-24 – 2022-12-26 (×2): 650 mg
  Filled 2022-12-20 (×2): qty 2

## 2022-12-20 NOTE — Progress Notes (Signed)
An USGPIV (ultrasound guided PIV) has been placed for short-term vasopressor infusion. A correctly placed ivWatch must be used when administering Vasopressors. Should this treatment be needed beyond 72 hours, central line access should be obtained.  It will be the responsibility of the bedside nurse to follow best practice to prevent extravasations.   

## 2022-12-20 NOTE — Progress Notes (Signed)
PHARMACY CONSULT NOTE  Pharmacy Consult for Electrolyte Monitoring and Replacement   Recent Labs: Potassium (mmol/L)  Date Value  12/20/2022 4.8  04/21/2012 4.8   Magnesium (mg/dL)  Date Value  16/04/9603 2.3   Calcium (mg/dL)  Date Value  54/03/8118 9.0   Calcium, Total (mg/dL)  Date Value  14/78/2956 8.8   Albumin (g/dL)  Date Value  21/30/8657 3.8  10/02/2022 4.2  04/21/2012 4.0   Phosphorus (mg/dL)  Date Value  84/69/6295 6.8 (H)   Sodium (mmol/L)  Date Value  12/20/2022 138  10/02/2022 140  04/21/2012 139     Assessment: 62 y.o. male w/ PMH of morbid obesity, alpha gal allergy, COPD, CHF, diabetes, polycythemia from chronic hypoxia, who presents with COPDe and CHFe. Pharmacy is asked to follow and replace electrolytes while in CCU  Diuretics: furosemide 40 mg IV every 12 hours  Goal of Therapy:  Electrolytes WNL  Plan:  ---no electrolytes replacement warranted for today ---recheck electrolytes in am  Lowella Bandy ,PharmD Clinical Pharmacist 12/20/2022 7:07 AM

## 2022-12-20 NOTE — Procedures (Signed)
Intubation Procedure Note  Bryan Mitchell  [id number]  07/17/1960  Date:12/20/22  Time:5:30 AM   Provider Performing:Finnleigh Marchetti L [place]    Procedure: Intubation (31500)  Indication(s) Respiratory Failure  Consent Unable to obtain consent due to emergent nature of procedure.   Anesthesia Rocuronium and Ketamine   Time Out Verified patient identification, verified procedure, site/side was marked, verified correct patient position, special equipment/implants available, medications/allergies/relevant history reviewed, required imaging and test results available.   Sterile Technique Usual hand hygeine, masks, and gloves were used   Procedure Description Patient positioned in bed supine.  Sedation given as noted above.  Patient was intubated with endotracheal tube using Glidescope.  View was Grade 1 full glottis .  Number of attempts was 1.  Colorimetric CO2 detector was consistent with tracheal placement.   Complications/Tolerance None; patient tolerated the procedure well. Chest X-ray is ordered to verify placement.   EBL Minimal   Specimen(s) None  Betsey Holiday, AGACNP-BC Acute Care Nurse Practitioner [facility]   [phone] / [phone] Please see Amion for details.

## 2022-12-20 NOTE — ED Notes (Signed)
Respiratory at bedside collecting blood gas.

## 2022-12-20 NOTE — Progress Notes (Signed)
Transport from ER to ICU without event, handoff to RRT Cori.

## 2022-12-20 NOTE — Progress Notes (Signed)
AVAPS: 550, 24, +8, 50%; Min P 20, Max P 30

## 2022-12-20 NOTE — Plan of Care (Signed)
  Problem: Education: Goal: Ability to demonstrate management of disease process will improve Outcome: Progressing Goal: Ability to verbalize understanding of medication therapies will improve Outcome: Progressing Goal: Individualized Educational Video(s) Outcome: Progressing   Problem: Cardiac: Goal: Ability to achieve and maintain adequate cardiopulmonary perfusion will improve Outcome: Progressing   Problem: Activity: Goal: Ability to tolerate increased activity will improve Outcome: Progressing   Problem: Respiratory: Goal: Ability to maintain a clear airway and adequate ventilation will improve Outcome: Progressing   Problem: Role Relationship: Goal: Method of communication will improve Outcome: Progressing   Problem: Education: Goal: Knowledge of General Education information will improve Description: Including pain rating scale, medication(s)/side effects and non-pharmacologic comfort measures Outcome: Progressing   Problem: Health Behavior/Discharge Planning: Goal: Ability to manage health-related needs will improve Outcome: Progressing   Problem: Clinical Measurements: Goal: Ability to maintain clinical measurements within normal limits will improve Outcome: Progressing Goal: Will remain free from infection Outcome: Progressing Goal: Diagnostic test results will improve Outcome: Progressing Goal: Respiratory complications will improve Outcome: Progressing Goal: Cardiovascular complication will be avoided Outcome: Progressing   Problem: Clinical Measurements: Goal: Ability to maintain clinical measurements within normal limits will improve Outcome: Progressing Goal: Will remain free from infection Outcome: Progressing Goal: Diagnostic test results will improve Outcome: Progressing Goal: Respiratory complications will improve Outcome: Progressing Goal: Cardiovascular complication will be avoided Outcome: Progressing   Problem: Clinical  Measurements: Goal: Ability to maintain clinical measurements within normal limits will improve Outcome: Progressing Goal: Will remain free from infection Outcome: Progressing Goal: Diagnostic test results will improve Outcome: Progressing Goal: Respiratory complications will improve Outcome: Progressing Goal: Cardiovascular complication will be avoided Outcome: Progressing   Problem: Clinical Measurements: Goal: Ability to maintain clinical measurements within normal limits will improve Outcome: Progressing Goal: Will remain free from infection Outcome: Progressing Goal: Diagnostic test results will improve Outcome: Progressing Goal: Respiratory complications will improve Outcome: Progressing Goal: Cardiovascular complication will be avoided Outcome: Progressing   Problem: Activity: Goal: Risk for activity intolerance will decrease Outcome: Progressing   Problem: Nutrition: Goal: Adequate nutrition will be maintained Outcome: Progressing   Problem: Coping: Goal: Level of anxiety will decrease Outcome: Progressing   Problem: Elimination: Goal: Will not experience complications related to bowel motility Outcome: Progressing Goal: Will not experience complications related to urinary retention Outcome: Progressing   Problem: Pain Managment: Goal: General experience of comfort will improve Outcome: Progressing   Problem: Safety: Goal: Ability to remain free from injury will improve Outcome: Progressing   Problem: Skin Integrity: Goal: Risk for impaired skin integrity will decrease Outcome: Progressing   Problem: Education: Goal: Ability to describe self-care measures that may prevent or decrease complications (Diabetes Survival Skills Education) will improve Outcome: Progressing Goal: Individualized Educational Video(s) Outcome: Progressing   Problem: Coping: Goal: Ability to adjust to condition or change in health will improve Outcome: Progressing    Problem: Health Behavior/Discharge Planning: Goal: Ability to identify and utilize available resources and services will improve Outcome: Progressing Goal: Ability to manage health-related needs will improve Outcome: Progressing   Problem: Nutritional: Goal: Maintenance of adequate nutrition will improve Outcome: Progressing Goal: Progress toward achieving an optimal weight will improve Outcome: Progressing   Problem: Tissue Perfusion: Goal: Adequacy of tissue perfusion will improve Outcome: Progressing

## 2022-12-20 NOTE — Consult Note (Signed)
NAME:  Bryan Mitchell, MRN:  [id number], DOB:  Sep 22, 1960, LOS: 1 ADMISSION DATE:  12/19/2022, CONSULTATION DATE:  12/20/22 REFERRING MD:  Cliffton Asters, NP, CHIEF COMPLAINT:  shortness of breath   History of Present Illness:  62 year old male presenting to [facility] ED from home via EMS on 12/19/2022 for evaluation of shortness of breath.  History provided per chart review as patient is in respiratory distress and unable to participate in interview at this time. Patient reported acute onset of worsening dyspnea over the last week with associated orthopnea and paroxysmal nocturnal dyspnea, worsening lower extremity edema and dyspnea on exertion.  He admitted to not taking his Lasix on a regular basis.  He denied fever/chills, but did endorse occasional cough.  Prior to EMS arrival the patient was administered 80 mg of Lasix.  Upon EMS arrival the patient was noted to be satting 80% on his home O2 of 5 L nasal cannula & tachycardic at 151.  EMS transition the patient to NRB and oxygen improved to 96%.  ED course: Upon arrival patient alert and appropriately following commands, reporting his dyspnea feels better than it did at home.  Patient transition to BiPAP support.  Imaging consistent with pulmonary edema and lab work suggestive of CHF exacerbation with an elevated BNP.  Labs also significant for mild hyperkalemia, mildly elevated troponin, AKI, hypochloremia, as well as mild thrombocytopenia.  Blood gas revealed significant respiratory acidosis. TRH consulted for admission due to suspected HFpEF exacerbation with AECOPD. Medications given: 60 mg Lasix Initial Vitals: 97.6, 22, 102, 138/64 and 96% on 60% FiO2 BiPAP Significant labs: (Labs/ Imaging personally reviewed) I, Cheryll Cockayne Rust-Chester, AGACNP-BC, personally viewed and interpreted this ECG. EKG Interpretation: Date: 12/19/2022 EKG Time: 21:36, Rate: 104, Rhythm: ST, QRS Axis: RAD Intervals: RBBB and LPFB, ST/T Wave abnormalities: Diffuse T  wave inversions, Narrative Interpretation: ST with RBBB and LPFB with diffuse T wave inversions  Chemistry: Na+: 137, K+: 5.3, BUN/Cr.:  53/1.26, Serum CO2/ AG: 32/8 Hematology: WBC: 6, Hgb: 18.4, plt: 145 Troponin: 20 > 19, BNP: 478.4, PCT: Pending, COVID-19 & Influenza A/B: Pending  ABG: 7.16/91/77/32.4 >> 7.16/101/68/36  CXR 12/19/2022: Cardiac enlargement with pulmonary vascular congestion, perihilar edema and probable effusions  Patient's respiratory acidosis did not improve after 4 hours on BiPAP with appropriate tidal volumes. PCCM consulted for assistance in management and monitoring due to acute on chronic hypoxic and hypercarbic respiratory failure requiring emergent intubation and mechanical ventilatory support.  Pertinent  Medical History  HFpEF COPD 5 L chronic oxygen HTN Polycythemia T2dm Significant Hospital Events: Including procedures, antibiotic start and stop dates in addition to other pertinent events   12/19/2022: Admitted by [facility] with HFpEF exacerbation and AECOPD.  Overnight respiratory acidosis did not improve on BiPAP support and PCCM urgently consulted for emergent intubation and mechanical ventilatory support due to acute on chronic hypoxic and hypercarbic respiratory failure.  Interim History / Subjective:  Patient agitated and restless in respiratory distress unable to participate in interview.  Objective   Blood pressure 116/82, pulse 85, temperature 97.6 F (36.4 C), temperature source Oral, resp. rate 16, height 5\' 8"  (1.727 m), weight (!) 163.4 kg, SpO2 (!) 88 %.    FiO2 (%):  [50 %-60 %] 50 %  No intake or output data in the 24 hours ending 12/20/22 0532 Filed Weights   12/19/22 2148  Weight: (!) 163.4 kg    Examination: General: Adult male, critically ill, lying in bed, agitated and restless in respiratory distress HEENT: MM pink/moist,  anicteric, atraumatic, neck supple, red sclera Neuro: RASS +2, unable to follow commands, PERRL +3 , MAE CV:  s1s2 RRR, ST on monitor, no r/m/g Pulm: Regular, labored on BiPAP, breath sounds diminished with expiratory wheezing-BUL & diminished-BLL GI: soft, rounded, non tender, bs x 4 Skin: Scattered ecchymosis and scabbed abrasions  Extremities: warm/dry, pulses + 2 R/P, +1 edema noted BLE  Resolved Hospital Problem list     Assessment & Plan:  Acute on chronic hypoxic / Hypercapnic Respiratory Failure multifocal in the setting of suspected HFpEF exacerbation & AECOPD  - Ventilator settings: PRVC  8 mL/kg, 90% FiO2, 8 PEEP, continue ventilator support & lung protective strategies - Wean PEEP & FiO2 as tolerated, maintain SpO2 > 90% - Head of bed elevated 30 degrees, VAP protocol in place - Plateau pressures less than 30 cm H20  - Intermittent chest x-ray & ABG PRN - Daily WUA with SBT as tolerated  - Ensure adequate pulmonary hygiene  - F/u PCT > consider empiric antibiotics depending on WBC & PCT trend - Steroids initiated: solu-medrol 40 mg BID  - Budesonide nebs BID, DuoNebs every 6 & bronchodilators PRN - PAD protocol in place: continue Fentanyl drip & Precedex drip  Acute on Chronic HFpEF exacerbation Hyperlipidemia - Echocardiogram ordered - Continuous cardiac monitoring  - Daily weights to assess volume status -Continue to diurese with the use of IV lasix twice daily-as hemodynamics and renal function allow -Continue outpatient Lipitor -Outpatient metoprolol on hold due to marginal hemodynamics, consider restarting as patient stabilizes  Acute Kidney Injury  Mild hyperkalemia Baseline Cr: 0.77, Cr on admission: 1.26 - Strict I/O's: alert provider if UOP < 0.5 mL/kg/hr -Judicious IV fluid resuscitation in the setting of HFpEF exacerbation - Daily BMP, replace electrolytes PRN - Avoid nephrotoxic agents as able, ensure adequate renal perfusion  T2DM At risk for Steroid Induced Hyperglycemia - Monitor CBG Q 4 hours - SSI resistant dosing - target range while in ICU:  140-180 - follow ICU hyper/hypo-glycemia protocol  Thrombocytopenia  Appears to be around baseline - Monitor for s/s of bleeding - Daily CBC, Monitor coag panel - Consider transfusion of platelets if < 10  Best Practice (right click and "Reselect all SmartList Selections" daily)  Diet/type: NPO w/ meds via tube DVT prophylaxis: LMWH GI prophylaxis: H2B Lines: N/A Foley:  Yes, and it is still needed Code Status:  full code Last date of multidisciplinary goals of care discussion [12/19/2022]  Labs   CBC: Recent Labs  Lab 12/19/22 2142  WBC 6.0  HGB 18.4*  HCT 64.3*  MCV 94.0  PLT 145*    Basic Metabolic Panel: Recent Labs  Lab 12/19/22 2142  NA 137  K 5.3*  CL 97*  CO2 32  GLUCOSE 132*  BUN 53*  CREATININE 1.26*  CALCIUM 9.0   GFR: Estimated Creatinine Clearance: 93.8 mL/min (A) (by C-G formula based on SCr of 1.26 mg/dL (H)). Recent Labs  Lab 12/19/22 2142  WBC 6.0    Liver Function Tests: Recent Labs  Lab 12/19/22 2142  AST 23  ALT 15  ALKPHOS 79  BILITOT 1.1  PROT 7.4  ALBUMIN 3.8   No results for input(s): "LIPASE", "AMYLASE" in the last 168 hours. No results for input(s): "AMMONIA" in the last 168 hours.  ABG    Component Value Date/Time   PHART 7.16 (LL) 12/20/2022 0430   PCO2ART 101 (HH) 12/20/2022 0430   PO2ART 68 (L) 12/20/2022 0430   HCO3 36.0 (H) 12/20/2022 0430  TCO2 39 (H) 01/13/2018 0006   O2SAT 92.4 12/20/2022 0430     Coagulation Profile: No results for input(s): "INR", "PROTIME" in the last 168 hours.  Cardiac Enzymes: No results for input(s): "CKTOTAL", "CKMB", "CKMBINDEX", "TROPONINI" in the last 168 hours.  HbA1C: Hgb A1c MFr Bld  Date/Time Value Ref Range Status  10/02/2022 09:41 AM 7.0 (H) 4.8 - 5.6 % Final    Comment:             Prediabetes: 5.7 - 6.4          Diabetes: >6.4          Glycemic control for adults with diabetes: <7.0   02/23/2022 10:05 PM 6.5 (H) 4.8 - 5.6 % Final    Comment:     (NOTE) Pre diabetes:          5.7%-6.4%  Diabetes:              >6.4%  Glycemic control for   <7.0% adults with diabetes     CBG: No results for input(s): "GLUCAP" in the last 168 hours.  Review of Systems:   UTA- patient altered and in respiratory distress, unable to participate in interview at this time.  Past Medical History:  He,  has a past medical history of (HFpEF) heart failure with preserved ejection fraction (HCC), Allergic reaction (12/15/2016), COPD (chronic obstructive pulmonary disease) (HCC), Hyperglycemia (12/2016), Hypertension, and Polycythemia.   Surgical History:   Past Surgical History:  Procedure Laterality Date   KNEE SURGERY Right      Social History:   reports that he has been smoking cigars. He has never used smokeless tobacco. He reports current alcohol use. He reports that he does not use drugs.   Family History:  His family history includes CAD in his brother and father.   Allergies Allergies  Allergen Reactions   Other Anaphylaxis    McCormick's Rub (Food)   Beef-Derived Products Itching    Alpha gal allergy   Pork-Derived Products Hives    Alpha gal     Home Medications  Prior to Admission medications   Medication Sig Start Date End Date Taking? Authorizing Provider  acetaminophen (TYLENOL) 325 MG tablet Take 2 tablets (650 mg total) by mouth every 4 (four) hours as needed for headache or mild pain. 04/12/22  Yes Esaw Grandchild A, DO  albuterol (VENTOLIN HFA) 108 (90 Base) MCG/ACT inhaler Inhale 2 puffs into the lungs every 4 (four) hours as needed for wheezing or shortness of breath. 11/26/22  Yes Sandre Kitty, MD  atorvastatin (LIPITOR) 20 MG tablet Take 1 tablet (20 mg total) by mouth daily. 11/26/22  Yes Sandre Kitty, MD  docusate sodium (COLACE) 100 MG capsule Take 1 capsule (100 mg total) by mouth 2 (two) times daily as needed for mild constipation. 04/12/22  Yes Esaw Grandchild A, DO  furosemide (LASIX) 40 MG tablet Take 80 mg  by mouth daily. 11/29/22  Yes [provider]  LIDODERM 5 % Place 1 patch onto the skin daily. 07/22/22  Yes [provider]  metFORMIN (GLUCOPHAGE) 850 MG tablet Take 1 tablet (850 mg total) by mouth 2 (two) times daily with a meal. 10/21/22  Yes Sandre Kitty, MD  Semaglutide, 1 MG/DOSE, 4 MG/3ML SOPN Inject 1 mg as directed once a week. 10/21/22  Yes Sandre Kitty, MD  Tiotropium Bromide-Olodaterol (STIOLTO RESPIMAT) 2.5-2.5 MCG/ACT AERS Inhale 2 puffs into the lungs daily. 10/21/22  Yes Sandre Kitty, MD  Vitamin D, Ergocalciferol, (DRISDOL) 1.25 MG (50000 UNIT) CAPS capsule Take 50,000 Units by mouth once a week. 10/08/22  Yes [provider]  aspirin 81 MG tablet Take 81 mg by mouth daily. Patient not taking: Reported on 10/21/2022    [provider]  atorvastatin (LIPITOR) 10 MG tablet Take 10 mg by mouth at bedtime. Patient not taking: Reported on 12/19/2022 07/22/22   [provider]  blood glucose meter kit and supplies Dispense based on patient and insurance preference. Use up to four times daily as directed. (FOR ICD-10 E10.9, E11.9). 01/15/18   Rodolph Bong, MD  diclofenac Sodium (VOLTAREN) 1 % GEL Apply 2 g topically 3 (three) times daily as needed (pain). Use on knees and other areas of pain Patient not taking: Reported on 10/21/2022 04/12/22   Esaw Grandchild A, DO  empagliflozin (JARDIANCE) 10 MG TABS tablet Take 1 tablet (10 mg total) by mouth daily before breakfast. Patient not taking: Reported on 12/19/2022 11/26/22   Sandre Kitty, MD  EPINEPHrine 0.3 mg/0.3 mL IJ SOAJ injection Inject 0.3 mg into the muscle as needed for anaphylaxis. 09/14/22   Corena Herter, MD  famotidine (PEPCID) 20 MG tablet Take 1 tablet (20 mg total) by mouth daily for 5 days. 09/14/22 09/19/22  Corena Herter, MD  feeding supplement (ENSURE ENLIVE / ENSURE PLUS) LIQD Take 237 mLs by mouth 3 (three) times daily between meals. Patient not taking: Reported on  10/21/2022 04/12/22   Esaw Grandchild A, DO  furosemide (LASIX) 20 MG tablet Take 3 tablets (60 mg total) by mouth 2 (two) times daily. Patient not taking: Reported on 12/19/2022 04/12/22   Esaw Grandchild A, DO  gabapentin (NEURONTIN) 100 MG capsule Take 2 capsules (200 mg total) by mouth 3 (three) times daily. Patient not taking: Reported on 10/21/2022 04/12/22   Esaw Grandchild A, DO  metoprolol tartrate (LOPRESSOR) 25 MG tablet Take 0.5 tablets (12.5 mg total) by mouth 2 (two) times daily. Patient not taking: Reported on 10/21/2022 04/12/22   Esaw Grandchild A, DO  Multiple Vitamin (MULTIVITAMIN WITH MINERALS) TABS tablet Take 1 tablet by mouth daily. Patient not taking: Reported on 10/21/2022 04/13/22   Esaw Grandchild A, DO  oxyCODONE-acetaminophen (PERCOCET/ROXICET) 5-325 MG tablet Take 1 tablet by mouth every 6 (six) hours as needed for severe pain. Patient not taking: Reported on 10/21/2022 04/12/22   Esaw Grandchild A, DO  tamsulosin (FLOMAX) 0.4 MG CAPS capsule Take 1 capsule (0.4 mg total) by mouth daily. Patient not taking: Reported on 10/21/2022 04/13/22   Pennie Banter, DO     Critical care time: 65 minutes       Cheryll Cockayne Rust-Chester, AGACNP-BC Acute Care Nurse Practitioner [facility]   [phone] / [phone] Please see Amion for details.

## 2022-12-20 NOTE — Progress Notes (Signed)
eLink Physician-Brief Progress Note Patient Name: Bryan Mitchell DOB: 08-17-1960 MRN: [id number]   Date of Service  12/20/2022  HPI/Events of Note  61/M presenting with progressive shortness of breath associated with PND, worsening LE edema. Workup in ED consistent with CHF exacerbation. He was started on diuretics, placed on BIPAP with initial improvement.  Overnight, he developed worsening hypoxemia, and was intubated.   eICU Interventions  - Admit to ICU - Maintain on vent support.    - TV 6-98ml/kg PBW, target plateau pressures <30     - Titrate FiO2, PEEP to maintain SpO2 >90%     - Follow serial ABG, will make further vent setting changes as needed - Continue sedation. Presently on fentanyl and precedex.  - Continue diuretic therapy. Will follow I/Os, daily weights.          Domenico Achord M DELA CRUZ 12/20/2022, 6:35 AM

## 2022-12-20 NOTE — Assessment & Plan Note (Addendum)
-   The patient will be admitted to a progressive unit bed. - This is clearly secondary to his acute CHF. - We will continue the patient on BiPAP. - We will check his ABG. - O2 protocol will be followed. - We will manage his acute CHF as below.

## 2022-12-20 NOTE — ED Notes (Signed)
At this time patient was found having pulled off bipap mask and was attempting to get out of bed. Pt face started to turn blue in color. Secretary called respiratory and other staff members entered room to help settle agitated patient. Patient was able to urinate 700cc urine. Staff moved patient back into bed. Respiratory reconnected patient to bipap and reconnected to RN's reconnected monitoring equipment. Morrie Sheldon, RN called Jon Billings, NP and informed her about patient and incident. She put in PRN ativan one time dose to use if needed but she must be notified first before giving to patient. Patient then given warm blanket and is now back asleep in bed.

## 2022-12-20 NOTE — Progress Notes (Signed)
Pt was very malodorous. Gave him a bath with warm soap and water just now. Performed peri-care and Foley care as well. Pt tolerated all very well.

## 2022-12-20 NOTE — ED Notes (Signed)
Pt sleeping at this time, even chest rise and fall noted.

## 2022-12-20 NOTE — Assessment & Plan Note (Addendum)
-   This is clearly secondary to noncompliance. - We will continue diuresis with IV Lasix. - We Will follow serial troponins. - We will follow I's and O's and daily weights.

## 2022-12-20 NOTE — Assessment & Plan Note (Addendum)
-   This is likely prerenal secondary to renal hypoperfusion from acute CHF. - This is associated with mild hyperkalemia that should correct with diuresis. - Will follow BMP with diuresis. - We will avoid nephrotoxins.

## 2022-12-20 NOTE — ED Notes (Signed)
Called respiratory to look at Bipap machine since patient is SPO2 in low 90s.

## 2022-12-20 NOTE — H&P (Incomplete)
[place]   PATIENT NAME: Bryan Mitchell    MR#:  [id number]  DATE OF BIRTH:  06/28/1961  DATE OF ADMISSION:  12/19/2022  PRIMARY CARE PHYSICIAN: Sandre Kitty, MD   Patient is coming from: Home  REQUESTING/REFERRING PHYSICIAN: Minna Antis, MD  CHIEF COMPLAINT:   Chief Complaint  Patient presents with  . Shortness of Breath    HISTORY OF PRESENT ILLNESS:  Donovan Logeman is a 62 y.o. male with medical history significant for HFpEF, COPD, hypertension polycythemia, who presented to emergency room with acute onset of worsening dyspnea over the last week with associated orthopnea and paroxysmal nocturnal dyspnea, worsening lower extremity edema and dyspnea on exertion.  The patient has not been taking his Lasix on a regular basis.  He is on home O2 at 5 L/min and was satting at 80% on his home O2.  He did not have any reported fever or chills.  The history was obtained by his sister who lives 10 minutes from him as the patient was fairly somnolent and less responsive on BiPAP.  ED Course: When he came to the ER EKG as reviewed by me : *** Imaging: *** PAST MEDICAL HISTORY:   Past Medical History:  Diagnosis Date  . (HFpEF) heart failure with preserved ejection fraction (HCC)   . Allergic reaction 12/15/2016  . COPD (chronic obstructive pulmonary disease) (HCC)   . Hyperglycemia 12/2016  . Hypertension   . Polycythemia     PAST SURGICAL HISTORY:   Past Surgical History:  Procedure Laterality Date  . KNEE SURGERY Right     SOCIAL HISTORY:   Social History   Tobacco Use  . Smoking status: Every Day    Types: Cigars  . Smokeless tobacco: Never  Substance Use Topics  . Alcohol use: Yes    FAMILY HISTORY:   Family History  Problem Relation Age of Onset  . CAD Father   . CAD Brother     DRUG ALLERGIES:   Allergies  Allergen Reactions  . Other Anaphylaxis    McCormick's Rub (Food)  . Beef-Derived Products Itching    Alpha gal allergy  .  Pork-Derived Photographer gal    REVIEW OF SYSTEMS:   ROS As per history of present illness. All pertinent systems were reviewed above. Constitutional, HEENT, cardiovascular, respiratory, GI, GU, musculoskeletal, neuro, psychiatric, endocrine, integumentary and hematologic systems were reviewed and are otherwise negative/unremarkable except for positive findings mentioned above in the HPI.   MEDICATIONS AT HOME:   Prior to Admission medications   Medication Sig Start Date End Date Taking? Authorizing Provider  acetaminophen (TYLENOL) 325 MG tablet Take 2 tablets (650 mg total) by mouth every 4 (four) hours as needed for headache or mild pain. 04/12/22  Yes Esaw Grandchild A, DO  albuterol (VENTOLIN HFA) 108 (90 Base) MCG/ACT inhaler Inhale 2 puffs into the lungs every 4 (four) hours as needed for wheezing or shortness of breath. 11/26/22  Yes Sandre Kitty, MD  atorvastatin (LIPITOR) 20 MG tablet Take 1 tablet (20 mg total) by mouth daily. 11/26/22  Yes Sandre Kitty, MD  docusate sodium (COLACE) 100 MG capsule Take 1 capsule (100 mg total) by mouth 2 (two) times daily as needed for mild constipation. 04/12/22  Yes Esaw Grandchild A, DO  furosemide (LASIX) 40 MG tablet Take 80 mg by mouth daily. 11/29/22  Yes [provider]  LIDODERM 5 % Place 1 patch onto the skin daily.  07/22/22  Yes [provider]  metFORMIN (GLUCOPHAGE) 850 MG tablet Take 1 tablet (850 mg total) by mouth 2 (two) times daily with a meal. 10/21/22  Yes Sandre Kitty, MD  Semaglutide, 1 MG/DOSE, 4 MG/3ML SOPN Inject 1 mg as directed once a week. 10/21/22  Yes Sandre Kitty, MD  Tiotropium Bromide-Olodaterol (STIOLTO RESPIMAT) 2.5-2.5 MCG/ACT AERS Inhale 2 puffs into the lungs daily. 10/21/22  Yes Sandre Kitty, MD  Vitamin D, Ergocalciferol, (DRISDOL) 1.25 MG (50000 UNIT) CAPS capsule Take 50,000 Units by mouth once a week. 10/08/22  Yes [provider]  aspirin 81 MG tablet Take 81 mg  by mouth daily. Patient not taking: Reported on 10/21/2022    [provider]  atorvastatin (LIPITOR) 10 MG tablet Take 10 mg by mouth at bedtime. Patient not taking: Reported on 12/19/2022 07/22/22   [provider]  blood glucose meter kit and supplies Dispense based on patient and insurance preference. Use up to four times daily as directed. (FOR ICD-10 E10.9, E11.9). 01/15/18   Rodolph Bong, MD  diclofenac Sodium (VOLTAREN) 1 % GEL Apply 2 g topically 3 (three) times daily as needed (pain). Use on knees and other areas of pain Patient not taking: Reported on 10/21/2022 04/12/22   Esaw Grandchild A, DO  empagliflozin (JARDIANCE) 10 MG TABS tablet Take 1 tablet (10 mg total) by mouth daily before breakfast. Patient not taking: Reported on 12/19/2022 11/26/22   Sandre Kitty, MD  EPINEPHrine 0.3 mg/0.3 mL IJ SOAJ injection Inject 0.3 mg into the muscle as needed for anaphylaxis. 09/14/22   Corena Herter, MD  famotidine (PEPCID) 20 MG tablet Take 1 tablet (20 mg total) by mouth daily for 5 days. 09/14/22 09/19/22  Corena Herter, MD  feeding supplement (ENSURE ENLIVE / ENSURE PLUS) LIQD Take 237 mLs by mouth 3 (three) times daily between meals. Patient not taking: Reported on 10/21/2022 04/12/22   Esaw Grandchild A, DO  furosemide (LASIX) 20 MG tablet Take 3 tablets (60 mg total) by mouth 2 (two) times daily. Patient not taking: Reported on 12/19/2022 04/12/22   Esaw Grandchild A, DO  gabapentin (NEURONTIN) 100 MG capsule Take 2 capsules (200 mg total) by mouth 3 (three) times daily. Patient not taking: Reported on 10/21/2022 04/12/22   Esaw Grandchild A, DO  metoprolol tartrate (LOPRESSOR) 25 MG tablet Take 0.5 tablets (12.5 mg total) by mouth 2 (two) times daily. Patient not taking: Reported on 10/21/2022 04/12/22   Esaw Grandchild A, DO  Multiple Vitamin (MULTIVITAMIN WITH MINERALS) TABS tablet Take 1 tablet by mouth daily. Patient not taking: Reported on 10/21/2022 04/13/22   Esaw Grandchild A, DO  oxyCODONE-acetaminophen (PERCOCET/ROXICET) 5-325 MG tablet Take 1 tablet by mouth every 6 (six) hours as needed for severe pain. Patient not taking: Reported on 10/21/2022 04/12/22   Esaw Grandchild A, DO  tamsulosin (FLOMAX) 0.4 MG CAPS capsule Take 1 capsule (0.4 mg total) by mouth daily. Patient not taking: Reported on 10/21/2022 04/13/22   Esaw Grandchild A, DO      VITAL SIGNS:  Blood pressure (!) 96/42, pulse (!) 102, resp. rate (!) 22, height 5\' 8"  (1.727 m), weight (!) 163.4 kg, SpO2 96 %.  PHYSICAL EXAMINATION:  Physical Exam  GENERAL:  62 y.o.-year-old patient lying in the bed with no acute distress.  EYES: Pupils equal, round, reactive to light and accommodation. No scleral icterus. Extraocular muscles intact.  HEENT: Head atraumatic, normocephalic. Oropharynx and nasopharynx clear.  NECK:  Supple,  no jugular venous distention. No thyroid enlargement, no tenderness.  LUNGS: Normal breath sounds bilaterally, no wheezing, rales,rhonchi or crepitation. No use of accessory muscles of respiration.  CARDIOVASCULAR: Regular rate and rhythm, S1, S2 normal. No murmurs, rubs, or gallops.  ABDOMEN: Soft, nondistended, nontender. Bowel sounds present. No organomegaly or mass.  EXTREMITIES: No pedal edema, cyanosis, or clubbing.  NEUROLOGIC: Cranial nerves II through XII are intact. Muscle strength 5/5 in all extremities. Sensation intact. Gait not checked.  PSYCHIATRIC: The patient is alert and oriented x 3.  Normal affect and good eye contact. SKIN: No obvious rash, lesion, or ulcer.   LABORATORY PANEL:   CBC Recent Labs  Lab 12/19/22 2142  WBC 6.0  HGB 18.4*  HCT 64.3*  PLT 145*   ------------------------------------------------------------------------------------------------------------------  Chemistries  Recent Labs  Lab 12/19/22 2142  NA 137  K 5.3*  CL 97*  CO2 32  GLUCOSE 132*  BUN 53*  CREATININE 1.26*  CALCIUM 9.0  AST 23  ALT 15  ALKPHOS 79   BILITOT 1.1   ------------------------------------------------------------------------------------------------------------------  Cardiac Enzymes No results for input(s): "TROPONINI" in the last 168 hours. ------------------------------------------------------------------------------------------------------------------  RADIOLOGY:  DG Chest Portable 1 View  Result Date: 12/19/2022 CLINICAL DATA:  Shortness of breath EXAM: PORTABLE CHEST 1 VIEW COMPARISON:  03/12/2022 FINDINGS: Shallow inspiration. Cardiac enlargement with pulmonary vascular congestion and bilateral perihilar infiltrates, likely edema. Probable small pleural effusions. No pneumothorax. Mediastinal contours appear intact. IMPRESSION: Cardiac enlargement with pulmonary vascular congestion, perihilar edema, and probable effusions. Electronically Signed   By: Burman Nieves M.D.   On: 12/19/2022 22:10      IMPRESSION AND PLAN:  Assessment and Plan: No notes have been filed under this hospital service. Service: Hospitalist      DVT prophylaxis: Lovenox***  Advanced Care Planning:  Code Status: full code***  Family Communication:  The plan of care was discussed in details with the patient (and family). I answered all questions. The patient agreed to proceed with the above mentioned plan. Further management will depend upon hospital course. Disposition Plan: Back to previous home environment Consults called: none***  All the records are reviewed and case discussed with ED provider.  Status is: Inpatient {Inpatient:23812}   At the time of the admission, it appears that the appropriate admission status for this patient is inpatient.  This is judged to be reasonable and necessary in order to provide the required intensity of service to ensure the patient's safety given the presenting symptoms, physical exam findings and initial radiographic and laboratory data in the context of comorbid conditions.  The patient requires  inpatient status due to high intensity of service, high risk of further deterioration and high frequency of surveillance required.  I certify that at the time of admission, it is my clinical judgment that the patient will require inpatient hospital care extending more than 2 midnights.                            Dispo: The patient is from: Home              Anticipated d/c is to: Home              Patient currently is not medically stable to d/c.              Difficult to place patient: No  Hannah Beat M.D on 12/19/2022 at 11:29 PM  [facility]   From 7 PM-7 AM,  contact night-coverage [email or web address]  CC: Primary care physician; Sandre Kitty, MD

## 2022-12-20 NOTE — Progress Notes (Signed)
Initial Nutrition Assessment  DOCUMENTATION CODES:   Morbid obesity  INTERVENTION:   Vital HP @70ml /hr- Initiate at 73ml/hr and increase by 73ml/hr q 8 hours until goal rate was reached.   Free water flushes 30ml q4 hours to maintain tube patency   Regimen provides 1680kcal/day, 147g/day protein and 1532ml/day of free water.   Daily weights   NUTRITION DIAGNOSIS:   Inadequate oral intake related to inability to eat (pt sedated and ventilated) as evidenced by NPO status.  GOAL:   Provide needs based on ASPEN/SCCM guidelines  MONITOR:   Vent status, Labs, Weight trends, TF tolerance, I & O's, Skin  REASON FOR ASSESSMENT:   Ventilator    ASSESSMENT:   62 y/o male with h/o COPD, DM, HTN, HLD, CHF, polycythemia and alpha gal who is admitted with AKI, CHF and COPD exacerbation  Pt sedated and ventilated. OGT in place. Will plan to initiate tube feeds today. Pt is well known to this RD from previous admissions. Pt unable to have any protein modulars secondary to his Alpha Gal allergy.  Pt does drink chocolate Ensure.   Per chart, pt is currently up ~20lbs from his UBW.   Medications reviewed and include: aspirin, colace, pepcid, lasix, insulin, solu-medrol, miralax, azithromycin, ceftriaxone, levophed   Labs reviewed: K 4.8 wnl, BUN 51(H), creat 1.26(H), P 6.8(H), Mg 2.3 wnl BNP- 478.4(H)- 6/13 Cbgs- 150, 133, 118 x 24 hrs  AIC 7.0(H)- 3/27  Patient is currently intubated on ventilator support MV: 12.4 L/min Temp (24hrs), Avg:97.9 F (36.6 C), Min:97.6 F (36.4 C), Max:98.5 F (36.9 C)  Propofol: none   MAP- >78mmHg   UOP-   NUTRITION - FOCUSED PHYSICAL EXAM:  Flowsheet Row Most Recent Value  Orbital Region No depletion  Upper Arm Region No depletion  Thoracic and Lumbar Region No depletion  Buccal Region No depletion  Temple Region No depletion  Clavicle Bone Region No depletion  Clavicle and Acromion Bone Region No depletion  Scapular Bone  Region No depletion  Dorsal Hand No depletion  Patellar Region No depletion  Anterior Thigh Region No depletion  Posterior Calf Region No depletion  Edema (RD Assessment) Moderate  Hair Reviewed  Eyes Reviewed  Mouth Reviewed  Skin Reviewed  Nails Reviewed   Diet Order:   Diet Order             Diet NPO time specified  Diet effective now                  EDUCATION NEEDS:   No education needs have been identified at this time  Skin:  Skin Assessment: Reviewed RN Assessment (ecchymosis)  Last BM:  PTA  Height:   Ht Readings from Last 1 Encounters:  12/19/22 5\' 8"  (1.727 m)    Weight:   Wt Readings from Last 1 Encounters:  12/19/22 (!) 163.4 kg    Ideal Body Weight:  70 kg  BMI:  Body mass index is 54.78 kg/m.  Estimated Nutritional Needs:   Kcal:  1540-1750kcal/day  Protein:  150-175g/day  Fluid:  1.8-2.1L/day  Betsey Holiday MS, RD, LDN Please refer to [facility] for RD and/or RD on-call/weekend/after hours pager

## 2022-12-20 NOTE — Progress Notes (Signed)
Pt intubated earlier this morning.  Peak pressures approximately 35, current settings: PRVC: 22 / 600/ 90% FiO2 / 8 Peep.  Decreased TV to 450 cc (about 7 cc/kg) and RR increased to 28 breaths/min to match current minute ventilation.  Peak pressures improved to about 30, seems to be tolerating.  Respiratory Therapy was at bedside when changes made.  Follow up ABG in 1 hr.    [name], AGACNP-BC [facility] Prefer epic messenger for cross cover needs If after hours, please call E-link

## 2022-12-21 ENCOUNTER — Inpatient Hospital Stay: Payer: Medicaid Other

## 2022-12-21 ENCOUNTER — Inpatient Hospital Stay (HOSPITAL_COMMUNITY)
Admit: 2022-12-21 | Discharge: 2022-12-21 | Disposition: A | Discharge status: Home / Self Care | Payer: Medicaid Other | Attending: Pulmonary Disease | Admitting: Pulmonary Disease

## 2022-12-21 DIAGNOSIS — I428 Other cardiomyopathies: Secondary | ICD-10-CM | POA: Diagnosis not present

## 2022-12-21 LAB — BLOOD GAS, ARTERIAL
Acid-Base Excess: 7.8 mmol/L — ABNORMAL HIGH (ref 0.0–2.0)
Bicarbonate: 32 mmol/L — ABNORMAL HIGH (ref 20.0–28.0)
FIO2: 90 %
O2 Saturation: 98 %
PEEP: 8 cmH2O
Patient temperature: 37
Pressure control: 18 cmH2O
RATE: 24 resp/min
pCO2 arterial: 42 mmHg (ref 32–48)
pH, Arterial: 7.49 — ABNORMAL HIGH (ref 7.35–7.45)
pO2, Arterial: 86 mmHg (ref 83–108)

## 2022-12-21 LAB — RENAL FUNCTION PANEL
Albumin: 3 g/dL — ABNORMAL LOW (ref 3.5–5.0)
Anion gap: 11 (ref 5–15)
BUN: 47 mg/dL — ABNORMAL HIGH (ref 6–20)
CO2: 29 mmol/L (ref 22–32)
Calcium: 8.5 mg/dL — ABNORMAL LOW (ref 8.9–10.3)
Chloride: 101 mmol/L (ref 98–111)
Creatinine, Ser: 1.09 mg/dL (ref 0.61–1.24)
GFR, Estimated: >60 mL/min (ref >60)
Glucose, Bld: 186 mg/dL — ABNORMAL HIGH (ref 70–99)
Phosphorus: 3.5 mg/dL (ref 2.5–4.6)
Potassium: 4.3 mmol/L (ref 3.5–5.1)
Sodium: 141 mmol/L (ref 135–145)

## 2022-12-21 LAB — CBC
HCT: 62.8 % — ABNORMAL HIGH (ref 39.0–52.0)
Hemoglobin: 19 g/dL — ABNORMAL HIGH (ref 13.0–17.0)
MCH: 26.4 pg (ref 26.0–34.0)
MCHC: 30.3 g/dL (ref 30.0–36.0)
MCV: 87.3 fL (ref 80.0–100.0)
Platelets: 94 10*3/uL — ABNORMAL LOW (ref 150–400)
RBC: 7.19 MIL/uL — ABNORMAL HIGH (ref 4.22–5.81)
RDW: 19.1 % — ABNORMAL HIGH (ref 11.5–15.5)
WBC: 5.4 10*3/uL (ref 4.0–10.5)
nRBC: 0 % (ref 0.0–0.2)

## 2022-12-21 LAB — ECHOCARDIOGRAM COMPLETE
AR max vel: 2.45 cm2
AV Peak grad: 9.5 mmHg
Ao pk vel: 1.54 m/s
Area-P 1/2: 2.75 cm2
Height: 68 in
S' Lateral: 2.7 cm
Weight: 5764.8 oz

## 2022-12-21 LAB — GLUCOSE, CAPILLARY
Glucose-Capillary: 178 mg/dL — ABNORMAL HIGH (ref 70–99)
Glucose-Capillary: 181 mg/dL — ABNORMAL HIGH (ref 70–99)
Glucose-Capillary: 208 mg/dL — ABNORMAL HIGH (ref 70–99)
Glucose-Capillary: 206 mg/dL — ABNORMAL HIGH (ref 70–99)
Glucose-Capillary: 212 mg/dL — ABNORMAL HIGH (ref 70–99)
Glucose-Capillary: 200 mg/dL — ABNORMAL HIGH (ref 70–99)
Glucose-Capillary: 199 mg/dL — ABNORMAL HIGH (ref 70–99)

## 2022-12-21 LAB — MAGNESIUM: Magnesium: 2.1 mg/dL (ref 1.7–2.4)

## 2022-12-21 MED ORDER — FUROSEMIDE 10 MG/ML IJ SOLN
60.0000 mg | Freq: Two times a day (BID) | INTRAMUSCULAR | Status: DC
  Administered 2022-12-21 – 2022-12-23 (×4): 60 mg via INTRAVENOUS
  Filled 2022-12-21 (×3): qty 6

## 2022-12-21 MED ORDER — NOREPINEPHRINE 4 MG/250ML-% IV SOLN
INTRAVENOUS | Status: AC
  Filled 2022-12-21: qty 250

## 2022-12-21 MED ORDER — AMOXICILLIN-POT CLAVULANATE 875-125 MG PO TABS
1.0000 | ORAL_TABLET | Freq: Two times a day (BID) | ORAL | Status: AC
  Administered 2022-12-21 – 2022-12-24 (×8): 1
  Filled 2022-12-21 (×8): qty 1

## 2022-12-21 MED ORDER — AZITHROMYCIN 500 MG PO TABS
500.0000 mg | ORAL_TABLET | Freq: Every day | ORAL | Status: AC
  Administered 2022-12-21 – 2022-12-22 (×2): 500 mg
  Filled 2022-12-21 (×2): qty 1

## 2022-12-21 MED ORDER — VECURONIUM BROMIDE 10 MG IV SOLR
INTRAVENOUS | Status: AC
  Administered 2022-12-21: 10 mg
  Filled 2022-12-21: qty 10

## 2022-12-21 MED ORDER — SODIUM CHLORIDE 0.9 % IV SOLN
2.0000 g | INTRAVENOUS | Status: DC
  Filled 2022-12-21: qty 20

## 2022-12-21 MED ORDER — METHYLPREDNISOLONE SODIUM SUCC 40 MG IJ SOLR
40.0000 mg | Freq: Every day | INTRAMUSCULAR | Status: DC
  Administered 2022-12-22 – 2022-12-28 (×7): 40 mg via INTRAVENOUS
  Filled 2022-12-21 (×7): qty 1

## 2022-12-21 MED ORDER — PROPOFOL 1000 MG/100ML IV EMUL
INTRAVENOUS | Status: AC
  Administered 2022-12-21 – 2022-12-22 (×2): 40 ug
  Filled 2022-12-21: qty 100

## 2022-12-21 MED ORDER — STERILE WATER FOR INJECTION IJ SOLN
INTRAMUSCULAR | Status: AC
  Filled 2022-12-21: qty 10

## 2022-12-21 NOTE — Progress Notes (Signed)
Attempted CPT via bed, patient did not tolerate the procedure ( he started flailing, spit bite block out, became very irritated), covering nurse called to bedside to assist with appropriate medicine.

## 2022-12-21 NOTE — Progress Notes (Signed)
  Echocardiogram 2D Echocardiogram has been performed.  Bryan Mitchell 12/21/2022, 8:29 AM

## 2022-12-21 NOTE — Progress Notes (Signed)
PHARMACY CONSULT NOTE  Pharmacy Consult for Electrolyte Monitoring and Replacement   Recent Labs: Potassium (mmol/L)  Date Value  12/21/2022 4.3  04/21/2012 4.8   Magnesium (mg/dL)  Date Value  60/45/4098 2.1   Calcium (mg/dL)  Date Value  11/91/4782 8.5 (L)   Calcium, Total (mg/dL)  Date Value  95/62/1308 8.8   Albumin (g/dL)  Date Value  65/78/4696 3.0 (L)  10/02/2022 4.2  04/21/2012 4.0   Phosphorus (mg/dL)  Date Value  29/52/8413 3.5   Sodium (mmol/L)  Date Value  12/21/2022 141  10/02/2022 140  04/21/2012 139     Assessment: 62 y.o. male w/ PMH of morbid obesity, alpha gal allergy, COPD, CHF, diabetes, polycythemia from chronic hypoxia, who presents with COPDe and CHFe. Pharmacy is asked to follow and replace electrolytes while in CCU  Diuretics: IV Lasix 40 mg q12h  Goal of Therapy:  Electrolytes within normal limits  Plan:  --No electrolytes replacement warranted for today --Re-check electrolytes in AM  Tressie Ellis 12/21/2022 9:12 AM

## 2022-12-21 NOTE — Progress Notes (Signed)
NAME:  Bryan Mitchell, MRN:  [id number], DOB:  06-Apr-1961, LOS: 2 ADMISSION DATE:  12/19/2022, CONSULTATION DATE:  12/20/22 REFERRING MD:  Cliffton Asters, NP, CHIEF COMPLAINT:  shortness of breath   History of Present Illness:  62 year old male presenting to [facility] ED from home via EMS on 12/19/2022 for evaluation of shortness of breath.  History provided per chart review as patient is in respiratory distress and unable to participate in interview at this time. Patient reported acute onset of worsening dyspnea over the last week with associated orthopnea and paroxysmal nocturnal dyspnea, worsening lower extremity edema and dyspnea on exertion.  He admitted to not taking his Lasix on a regular basis.  He denied fever/chills, but did endorse occasional cough.  Prior to EMS arrival the patient was administered 80 mg of Lasix.  Upon EMS arrival the patient was noted to be satting 80% on his home O2 of 5 L nasal cannula & tachycardic at 151.  EMS transition the patient to NRB and oxygen improved to 96%.  12/21/22- patient is on 80% PRVC, +foley with lasix non oliguric , sedation with fentanyl and propofol, off levophed now.    Pertinent  Medical History  HFpEF COPD 5 L chronic oxygen HTN Polycythemia T2dm Significant Hospital Events: Including procedures, antibiotic start and stop dates in addition to other pertinent events   12/19/2022: Admitted by [facility] with HFpEF exacerbation and AECOPD.  Overnight respiratory acidosis did not improve on BiPAP support and PCCM urgently consulted for emergent intubation and mechanical ventilatory support due to acute on chronic hypoxic and hypercarbic respiratory failure.  Interim History / Subjective:  Patient agitated and restless in respiratory distress unable to participate in interview.  Objective   Blood pressure 122/62, pulse 72, temperature 98.5 F (36.9 C), temperature source Oral, resp. rate (!) 24, height 5\' 8"  (1.727 m), weight (!) 163.4 kg, SpO2 99 %.     Vent Mode: PCV FiO2 (%):  [90 %-100 %] 90 % Set Rate:  [24 bmp-28 bmp] 24 bmp Vt Set:  [450 mL] 450 mL PEEP:  [8 cmH20-10 cmH20] 8 cmH20   Intake/Output Summary (Last 24 hours) at 12/21/2022 7829 Last data filed at 12/21/2022 0400 Gross per 24 hour  Intake 1259.46 ml  Output 3950 ml  Net -2690.54 ml   Filed Weights   12/19/22 2148  Weight: (!) 163.4 kg    Examination: General: Adult male, critically ill, lying in bed, agitated and restless in respiratory distress HEENT: MM pink/moist, anicteric, atraumatic, neck supple, red sclera Neuro: RASS +2, unable to follow commands, PERRL +3 , MAE CV: s1s2 RRR, ST on monitor, no r/m/g Pulm: Regular, labored on BiPAP, breath sounds diminished with expiratory wheezing-BUL & diminished-BLL GI: soft, rounded, non tender, bs x 4 Skin: Scattered ecchymosis and scabbed abrasions  Extremities: warm/dry, pulses + 2 R/P, +1 edema noted BLE  Imaging       Assessment & Plan:  Acute on chronic hypoxic / Hypercapnic Respiratory Failure multifocal in the setting of suspected HFpEF exacerbation & AECOPD  - Ventilator settings: PRVC  8 mL/kg, 90% FiO2, 8 PEEP, continue ventilator support & lung protective strategies - Wean PEEP & FiO2 as tolerated, maintain SpO2 > 90% - Head of bed elevated 30 degrees, VAP protocol in place - Plateau pressures less than 30 cm H20  - Intermittent chest x-ray & ABG PRN - Daily WUA with SBT as tolerated  - Ensure adequate pulmonary hygiene  - F/u PCT > consider empiric antibiotics depending on WBC &  PCT trend - Steroids initiated: solu-medrol 40 mg BID >>once daily  - Budesonide nebs BID, DuoNebs every 6 & bronchodilators PRN - PAD protocol in place: continue Fentanyl drip & Precedex drip -  diuresing more aggresively - have increased 60 bid   Acute on Chronic HFpEF exacerbation Hyperlipidemia - Echocardiogram ordered - Continuous cardiac monitoring  - Daily weights to assess volume status -Continue to  diurese with the use of IV lasix twice daily-as hemodynamics and renal function allow -Continue outpatient Lipitor -Outpatient metoprolol on hold due to marginal hemodynamics, consider restarting as patient stabilizes  Acute Kidney Injury  Mild hyperkalemia Baseline Cr: 0.77, Cr on admission: 1.26 - Strict I/O's: alert provider if UOP < 0.5 mL/kg/hr -Judicious IV fluid resuscitation in the setting of HFpEF exacerbation - Daily BMP, replace electrolytes PRN - Avoid nephrotoxic agents as able, ensure adequate renal perfusion  T2DM At risk for Steroid Induced Hyperglycemia - Monitor CBG Q 4 hours - SSI resistant dosing - target range while in ICU: 140-180 - follow ICU hyper/hypo-glycemia protocol  Thrombocytopenia  Appears to be around baseline - Monitor for s/s of bleeding - Daily CBC, Monitor coag panel - Consider transfusion of platelets if < 10  Best Practice (right click and "Reselect all SmartList Selections" daily)  Diet/type: NPO w/ meds via tube DVT prophylaxis: LMWH GI prophylaxis: H2B Lines: N/A Foley:  Yes, and it is still needed Code Status:  full code Last date of multidisciplinary goals of care discussion [12/19/2022]  Labs   CBC: Recent Labs  Lab 12/19/22 2142 12/20/22 0557  WBC 6.0 5.7  HGB 18.4* 18.8*  HCT 64.3* 66.8*  MCV 94.0 95.4  PLT 145* 116*     Basic Metabolic Panel: Recent Labs  Lab 12/19/22 2142 12/20/22 0557 12/21/22 0655  NA 137 138 141  K 5.3* 4.8 4.3  CL 97* 99 101  CO2 32 30 29  GLUCOSE 132* 123* 186*  BUN 53* 51* 47*  CREATININE 1.26* 1.26* 1.09  CALCIUM 9.0 9.0 8.5*  MG  --  2.3 2.1  PHOS  --  6.8* 3.5    GFR: Estimated Creatinine Clearance: 108.5 mL/min (by C-G formula based on SCr of 1.09 mg/dL). Recent Labs  Lab 12/19/22 2142 12/20/22 0557  PROCALCITON  --  0.62  WBC 6.0 5.7     Liver Function Tests: Recent Labs  Lab 12/19/22 2142 12/21/22 0655  AST 23  --   ALT 15  --   ALKPHOS 79  --   BILITOT 1.1   --   PROT 7.4  --   ALBUMIN 3.8 3.0*    No results for input(s): "LIPASE", "AMYLASE" in the last 168 hours. No results for input(s): "AMMONIA" in the last 168 hours.  ABG    Component Value Date/Time   PHART 7.49 (H) 12/21/2022 0157   PCO2ART 42 12/21/2022 0157   PO2ART 86 12/21/2022 0157   HCO3 32.0 (H) 12/21/2022 0157   TCO2 39 (H) 01/13/2018 0006   O2SAT 98 12/21/2022 0157     Coagulation Profile: Recent Labs  Lab 12/20/22 0752  INR 1.2    Cardiac Enzymes: No results for input(s): "CKTOTAL", "CKMB", "CKMBINDEX", "TROPONINI" in the last 168 hours.  HbA1C: Hgb A1c MFr Bld  Date/Time Value Ref Range Status  10/02/2022 09:41 AM 7.0 (H) 4.8 - 5.6 % Final    Comment:             Prediabetes: 5.7 - 6.4  Diabetes: >6.4          Glycemic control for adults with diabetes: <7.0   02/23/2022 10:05 PM 6.5 (H) 4.8 - 5.6 % Final    Comment:    (NOTE) Pre diabetes:          5.7%-6.4%  Diabetes:              >6.4%  Glycemic control for   <7.0% adults with diabetes     CBG: Recent Labs  Lab 12/20/22 1920 12/20/22 2343 12/21/22 0351 12/21/22 0412 12/21/22 0733  GLUCAP 139* 165* 178* 181* 208*    Review of Systems:   UTA- patient altered and in respiratory distress, unable to participate in interview at this time.  Past Medical History:  He,  has a past medical history of (HFpEF) heart failure with preserved ejection fraction (HCC), Allergic reaction (12/15/2016), COPD (chronic obstructive pulmonary disease) (HCC), Hyperglycemia (12/2016), Hypertension, and Polycythemia.   Surgical History:   Past Surgical History:  Procedure Laterality Date   KNEE SURGERY Right      Social History:   reports that he has been smoking cigars. He has never used smokeless tobacco. He reports current alcohol use. He reports that he does not use drugs.   Family History:  His family history includes CAD in his brother and father.   Allergies Allergies  Allergen  Reactions   Other Anaphylaxis    McCormick's Rub (Food)   Beef-Derived Products Itching    Alpha gal allergy   Pork-Derived Products Hives    Alpha gal     Home Medications  Prior to Admission medications   Medication Sig Start Date End Date Taking? Authorizing Provider  acetaminophen (TYLENOL) 325 MG tablet Take 2 tablets (650 mg total) by mouth every 4 (four) hours as needed for headache or mild pain. 04/12/22  Yes Esaw Grandchild A, DO  albuterol (VENTOLIN HFA) 108 (90 Base) MCG/ACT inhaler Inhale 2 puffs into the lungs every 4 (four) hours as needed for wheezing or shortness of breath. 11/26/22  Yes Sandre Kitty, MD  atorvastatin (LIPITOR) 20 MG tablet Take 1 tablet (20 mg total) by mouth daily. 11/26/22  Yes Sandre Kitty, MD  docusate sodium (COLACE) 100 MG capsule Take 1 capsule (100 mg total) by mouth 2 (two) times daily as needed for mild constipation. 04/12/22  Yes Esaw Grandchild A, DO  furosemide (LASIX) 40 MG tablet Take 80 mg by mouth daily. 11/29/22  Yes [provider]  LIDODERM 5 % Place 1 patch onto the skin daily. 07/22/22  Yes [provider]  metFORMIN (GLUCOPHAGE) 850 MG tablet Take 1 tablet (850 mg total) by mouth 2 (two) times daily with a meal. 10/21/22  Yes Sandre Kitty, MD  Semaglutide, 1 MG/DOSE, 4 MG/3ML SOPN Inject 1 mg as directed once a week. 10/21/22  Yes Sandre Kitty, MD  Tiotropium Bromide-Olodaterol (STIOLTO RESPIMAT) 2.5-2.5 MCG/ACT AERS Inhale 2 puffs into the lungs daily. 10/21/22  Yes Sandre Kitty, MD  Vitamin D, Ergocalciferol, (DRISDOL) 1.25 MG (50000 UNIT) CAPS capsule Take 50,000 Units by mouth once a week. 10/08/22  Yes [provider]  aspirin 81 MG tablet Take 81 mg by mouth daily. Patient not taking: Reported on 10/21/2022    [provider]  atorvastatin (LIPITOR) 10 MG tablet Take 10 mg by mouth at bedtime. Patient not taking: Reported on 12/19/2022 07/22/22   [provider]  blood glucose meter  kit and supplies Dispense based on patient  and insurance preference. Use up to four times daily as directed. (FOR ICD-10 E10.9, E11.9). 01/15/18   Rodolph Bong, MD  diclofenac Sodium (VOLTAREN) 1 % GEL Apply 2 g topically 3 (three) times daily as needed (pain). Use on knees and other areas of pain Patient not taking: Reported on 10/21/2022 04/12/22   Esaw Grandchild A, DO  empagliflozin (JARDIANCE) 10 MG TABS tablet Take 1 tablet (10 mg total) by mouth daily before breakfast. Patient not taking: Reported on 12/19/2022 11/26/22   Sandre Kitty, MD  EPINEPHrine 0.3 mg/0.3 mL IJ SOAJ injection Inject 0.3 mg into the muscle as needed for anaphylaxis. 09/14/22   Corena Herter, MD  famotidine (PEPCID) 20 MG tablet Take 1 tablet (20 mg total) by mouth daily for 5 days. 09/14/22 09/19/22  Corena Herter, MD  feeding supplement (ENSURE ENLIVE / ENSURE PLUS) LIQD Take 237 mLs by mouth 3 (three) times daily between meals. Patient not taking: Reported on 10/21/2022 04/12/22   Esaw Grandchild A, DO  furosemide (LASIX) 20 MG tablet Take 3 tablets (60 mg total) by mouth 2 (two) times daily. Patient not taking: Reported on 12/19/2022 04/12/22   Esaw Grandchild A, DO  gabapentin (NEURONTIN) 100 MG capsule Take 2 capsules (200 mg total) by mouth 3 (three) times daily. Patient not taking: Reported on 10/21/2022 04/12/22   Esaw Grandchild A, DO  metoprolol tartrate (LOPRESSOR) 25 MG tablet Take 0.5 tablets (12.5 mg total) by mouth 2 (two) times daily. Patient not taking: Reported on 10/21/2022 04/12/22   Esaw Grandchild A, DO  Multiple Vitamin (MULTIVITAMIN WITH MINERALS) TABS tablet Take 1 tablet by mouth daily. Patient not taking: Reported on 10/21/2022 04/13/22   Esaw Grandchild A, DO  oxyCODONE-acetaminophen (PERCOCET/ROXICET) 5-325 MG tablet Take 1 tablet by mouth every 6 (six) hours as needed for severe pain. Patient not taking: Reported on 10/21/2022 04/12/22   Esaw Grandchild A, DO  tamsulosin (FLOMAX) 0.4 MG CAPS capsule  Take 1 capsule (0.4 mg total) by mouth daily. Patient not taking: Reported on 10/21/2022 04/13/22   Pennie Banter, DO     Critical care provider statement:   Total critical care time: 33 minutes   Performed by: Karna Christmas MD   Critical care time was exclusive of separately billable procedures and treating other patients.   Critical care was necessary to treat or prevent imminent or life-threatening deterioration.   Critical care was time spent personally by me on the following activities: development of treatment plan with patient and/or surrogate as well as nursing, discussions with consultants, evaluation of patient's response to treatment, examination of patient, obtaining history from patient or surrogate, ordering and performing treatments and interventions, ordering and review of laboratory studies, ordering and review of radiographic studies, pulse oximetry and re-evaluation of patient's condition.    Vida Rigger, M.D.  Pulmonary & Critical Care Medicine

## 2022-12-22 ENCOUNTER — Inpatient Hospital Stay: Payer: Medicaid Other

## 2022-12-22 LAB — RENAL FUNCTION PANEL
Albumin: 3 g/dL — ABNORMAL LOW (ref 3.5–5.0)
Anion gap: 9 (ref 5–15)
BUN: 44 mg/dL — ABNORMAL HIGH (ref 6–20)
CO2: 32 mmol/L (ref 22–32)
Calcium: 8.1 mg/dL — ABNORMAL LOW (ref 8.9–10.3)
Chloride: 100 mmol/L (ref 98–111)
Creatinine, Ser: 1.1 mg/dL (ref 0.61–1.24)
GFR, Estimated: >60 mL/min (ref >60)
Glucose, Bld: 135 mg/dL — ABNORMAL HIGH (ref 70–99)
Phosphorus: 5.5 mg/dL — ABNORMAL HIGH (ref 2.5–4.6)
Potassium: 4.3 mmol/L (ref 3.5–5.1)
Sodium: 141 mmol/L (ref 135–145)

## 2022-12-22 LAB — CBC
HCT: 61.9 % — ABNORMAL HIGH (ref 39.0–52.0)
Hemoglobin: 18.3 g/dL — ABNORMAL HIGH (ref 13.0–17.0)
MCH: 26.5 pg (ref 26.0–34.0)
MCHC: 29.6 g/dL — ABNORMAL LOW (ref 30.0–36.0)
MCV: 89.6 fL (ref 80.0–100.0)
Platelets: 84 10*3/uL — ABNORMAL LOW (ref 150–400)
RBC: 6.91 MIL/uL — ABNORMAL HIGH (ref 4.22–5.81)
RDW: 19.2 % — ABNORMAL HIGH (ref 11.5–15.5)
WBC: 6.3 10*3/uL (ref 4.0–10.5)
nRBC: 0.3 % — ABNORMAL HIGH (ref 0.0–0.2)

## 2022-12-22 LAB — GLUCOSE, CAPILLARY
Glucose-Capillary: 119 mg/dL — ABNORMAL HIGH (ref 70–99)
Glucose-Capillary: 145 mg/dL — ABNORMAL HIGH (ref 70–99)
Glucose-Capillary: 152 mg/dL — ABNORMAL HIGH (ref 70–99)
Glucose-Capillary: 166 mg/dL — ABNORMAL HIGH (ref 70–99)
Glucose-Capillary: 195 mg/dL — ABNORMAL HIGH (ref 70–99)
Glucose-Capillary: 221 mg/dL — ABNORMAL HIGH (ref 70–99)

## 2022-12-22 MED ORDER — NOREPINEPHRINE 4 MG/250ML-% IV SOLN
2.0000 ug/min | INTRAVENOUS | Status: DC
  Administered 2022-12-22: 6 ug/min via INTRAVENOUS
  Administered 2022-12-22 – 2022-12-24 (×2): 5 ug/min via INTRAVENOUS
  Administered 2022-12-25: 4 ug/min via INTRAVENOUS
  Administered 2022-12-25: 6 ug/min via INTRAVENOUS
  Administered 2022-12-26: 9 ug/min via INTRAVENOUS
  Filled 2022-12-22 (×5): qty 250

## 2022-12-22 MED ORDER — IOHEXOL 350 MG/ML SOLN
80.0000 mL | Freq: Once | INTRAVENOUS | Status: AC | PRN
  Administered 2022-12-22: 80 mL via INTRAVENOUS

## 2022-12-22 MED ORDER — IOHEXOL 350 MG/ML SOLN
75.0000 mL | Freq: Once | INTRAVENOUS | Status: AC | PRN
  Administered 2022-12-22: 75 mL via INTRAVENOUS

## 2022-12-22 MED ORDER — NOREPINEPHRINE 4 MG/250ML-% IV SOLN: 0.0000 ug/min | INTRAVENOUS | Status: DC

## 2022-12-22 MED ORDER — SODIUM CHLORIDE 0.9 % IV SOLN
250.0000 mL | INTRAVENOUS | Status: DC
  Administered 2022-12-22: 250 mL via INTRAVENOUS

## 2022-12-22 MED ORDER — FUROSEMIDE 10 MG/ML IJ SOLN
40.0000 mg | Freq: Once | INTRAMUSCULAR | Status: AC
  Administered 2022-12-22: 40 mg via INTRAVENOUS
  Filled 2022-12-22: qty 4

## 2022-12-22 MED ORDER — PROPOFOL 1000 MG/100ML IV EMUL
5.0000 ug/kg/min | INTRAVENOUS | Status: DC
  Administered 2022-12-22: 45 ug/kg/min via INTRAVENOUS
  Administered 2022-12-22 – 2022-12-23 (×6): 40 ug/kg/min via INTRAVENOUS
  Administered 2022-12-23 (×3): 45 ug/kg/min via INTRAVENOUS
  Administered 2022-12-23: 40 ug/kg/min via INTRAVENOUS
  Administered 2022-12-23 (×4): 45 ug/kg/min via INTRAVENOUS
  Administered 2022-12-24: 30 ug/kg/min via INTRAVENOUS
  Administered 2022-12-24: 40 ug/kg/min via INTRAVENOUS
  Administered 2022-12-24: 45 ug/kg/min via INTRAVENOUS
  Administered 2022-12-24: 40 ug/kg/min via INTRAVENOUS
  Administered 2022-12-24: 45 ug/kg/min via INTRAVENOUS
  Administered 2022-12-24: 30 ug/kg/min via INTRAVENOUS
  Administered 2022-12-24: 45 ug/kg/min via INTRAVENOUS
  Administered 2022-12-24 – 2022-12-26 (×13): 30 ug/kg/min via INTRAVENOUS
  Administered 2022-12-27 (×2): 20 ug/kg/min via INTRAVENOUS
  Administered 2022-12-27: 15 ug/kg/min via INTRAVENOUS
  Administered 2022-12-27: 30 ug/kg/min via INTRAVENOUS
  Administered 2022-12-28: 10 ug/kg/min via INTRAVENOUS
  Filled 2022-12-22 (×48): qty 100

## 2022-12-22 MED ORDER — VASOPRESSIN 20 UNITS/100 ML INFUSION FOR SHOCK
0.0000 [IU]/min | INTRAVENOUS | Status: DC
  Filled 2022-12-22: qty 100

## 2022-12-22 NOTE — Progress Notes (Signed)
Recruitment maneuver performed on this patient during vent rounds without incident. Press 40, Ti 3 sec, R10 100% peep 5. X 1 minute.

## 2022-12-22 NOTE — Progress Notes (Signed)
NAME:  Bryan Mitchell, MRN:  [id number], DOB:  04/01/1961, LOS: 3 ADMISSION DATE:  12/19/2022, CONSULTATION DATE:  12/20/22 REFERRING MD:  Cliffton Asters, NP, CHIEF COMPLAINT:  shortness of breath   History of Present Illness:  62 year old male presenting to [facility] ED from home via EMS on 12/19/2022 for evaluation of shortness of breath.  History provided per chart review as patient is in respiratory distress and unable to participate in interview at this time. Patient reported acute onset of worsening dyspnea over the last week with associated orthopnea and paroxysmal nocturnal dyspnea, worsening lower extremity edema and dyspnea on exertion.  He admitted to not taking his Lasix on a regular basis.  He denied fever/chills, but did endorse occasional cough.  Prior to EMS arrival the patient was administered 80 mg of Lasix.  Upon EMS arrival the patient was noted to be satting 80% on his home O2 of 5 L nasal cannula & tachycardic at 151.  EMS transition the patient to NRB and oxygen improved to 96%.  12/21/22- patient is on 80% PRVC, +foley with lasix non oliguric , sedation with fentanyl and propofol, off levophed now.   12/22/22- patient had acute event overnight with severe resp distress aggitation attempt to self extubate.  He is on 100% and in shock requiring levophed support  Pertinent  Medical History  HFpEF COPD 5 L chronic oxygen HTN Polycythemia T2dm Significant Hospital Events: Including procedures, antibiotic start and stop dates in addition to other pertinent events   12/19/2022: Admitted by [facility] with HFpEF exacerbation and AECOPD.  Overnight respiratory acidosis did not improve on BiPAP support and PCCM urgently consulted for emergent intubation and mechanical ventilatory support due to acute on chronic hypoxic and hypercarbic respiratory failure.  Interim History / Subjective:  Patient agitated and restless in respiratory distress unable to participate in interview.  Objective    Blood pressure 115/63, pulse 77, temperature 100 F (37.8 C), temperature source Oral, resp. rate (!) 24, height 5\' 8"  (1.727 m), weight (!) 158.4 kg, SpO2 95 %.    Vent Mode: PRVC FiO2 (%):  [60 %-100 %] 100 % Set Rate:  [24 bmp] 24 bmp Vt Set:  [450 mL] 450 mL PEEP:  [8 cmH20] 8 cmH20 Plateau Pressure:  [19 cmH20-20 cmH20] 20 cmH20   Intake/Output Summary (Last 24 hours) at 12/22/2022 0840 Last data filed at 12/22/2022 0400 Gross per 24 hour  Intake 2721.98 ml  Output 4400 ml  Net -1678.02 ml    Filed Weights   12/19/22 2148 12/22/22 0500  Weight: (!) 163.4 kg (!) 158.4 kg    Examination: General: Adult male, critically ill, lying in bed, agitated and restless in respiratory distress HEENT: MM pink/moist, anicteric, atraumatic, neck supple, red sclera Neuro: RASS +2, unable to follow commands, PERRL +3 , MAE CV: s1s2 RRR, ST on monitor, no r/m/g Pulm: Regular, labored on BiPAP, breath sounds diminished with expiratory wheezing-BUL & diminished-BLL GI: soft, rounded, non tender, bs x 4 Skin: Scattered ecchymosis and scabbed abrasions  Extremities: warm/dry, pulses + 2 R/P, +1 edema noted BLE  Imaging       Assessment & Plan:  Acute on chronic hypoxic / Hypercapnic Respiratory Failure multifocal in the setting of suspected HFpEF exacerbation & AECOPD  - Ventilator settings: PRVC  8 mL/kg, 90% FiO2, 8 PEEP, continue ventilator support & lung protective strategies - Wean PEEP & FiO2 as tolerated, maintain SpO2 > 90% - Head of bed elevated 30 degrees, VAP protocol in place [facility]  pressures less than 30 cm H20  - Intermittent chest x-ray & ABG PRN - Daily WUA with SBT as tolerated  - Ensure adequate pulmonary hygiene  - F/u PCT > consider empiric antibiotics depending on WBC & PCT trend - Steroids initiated: solu-medrol 40 mg BID >>once daily  - Budesonide nebs BID, DuoNebs every 6 & bronchodilators PRN - PAD protocol in place: continue Fentanyl drip & Precedex  drip -  diuresing more aggresively - have increased 60 bid  -s/p 4.5L diuresis  -need to rule out PE have ordered CTPE now that renal function has improved   Acute on Chronic HFpEF exacerbation Hyperlipidemia - Echocardiogram ordered - Continuous cardiac monitoring  - Daily weights to assess volume status -Continue to diurese with the use of IV lasix twice daily-as hemodynamics and renal function allow -Continue outpatient Lipitor -Outpatient metoprolol on hold due to marginal hemodynamics, consider restarting as patient stabilizes  Acute Kidney Injury -RESOLVED Mild hyperkalemia Baseline Cr: 0.77, Cr on admission: 1.26 - Strict I/O's: alert provider if UOP < 0.5 mL/kg/hr -Judicious IV fluid resuscitation in the setting of HFpEF exacerbation - Daily BMP, replace electrolytes PRN - Avoid nephrotoxic agents as able, ensure adequate renal perfusion  T2DM At risk for Steroid Induced Hyperglycemia - Monitor CBG Q 4 hours - SSI resistant dosing - target range while in ICU: 140-180 - follow ICU hyper/hypo-glycemia protocol  Thrombocytopenia  Appears to be around baseline - Monitor for s/s of bleeding - Daily CBC, Monitor coag panel - Consider transfusion of platelets if < 10  Best Practice (right click and "Reselect all SmartList Selections" daily)  Diet/type: NPO w/ meds via tube DVT prophylaxis: LMWH GI prophylaxis: H2B Lines: N/A Foley:  Yes, and it is still needed Code Status:  full code Last date of multidisciplinary goals of care discussion [12/19/2022]  Labs   CBC: Recent Labs  Lab 12/19/22 2142 12/20/22 0557 12/21/22 0655 12/22/22 0435  WBC 6.0 5.7 5.4 6.3  HGB 18.4* 18.8* 19.0* 18.3*  HCT 64.3* 66.8* 62.8* 61.9*  MCV 94.0 95.4 87.3 89.6  PLT 145* 116* 94* 84*     Basic Metabolic Panel: Recent Labs  Lab 12/19/22 2142 12/20/22 0557 12/21/22 0655 12/22/22 0435  NA 137 138 141 141  K 5.3* 4.8 4.3 4.3  CL 97* 99 101 100  CO2 32 30 29 32  GLUCOSE 132*  123* 186* 135*  BUN 53* 51* 47* 44*  CREATININE 1.26* 1.26* 1.09 1.10  CALCIUM 9.0 9.0 8.5* 8.1*  MG  --  2.3 2.1  --   PHOS  --  6.8* 3.5 5.5*    GFR: Estimated Creatinine Clearance: 105.5 mL/min (by C-G formula based on SCr of 1.1 mg/dL). Recent Labs  Lab 12/19/22 2142 12/20/22 0557 12/21/22 0655 12/22/22 0435  PROCALCITON  --  0.62  --   --   WBC 6.0 5.7 5.4 6.3     Liver Function Tests: Recent Labs  Lab 12/19/22 2142 12/21/22 0655 12/22/22 0435  AST 23  --   --   ALT 15  --   --   ALKPHOS 79  --   --   BILITOT 1.1  --   --   PROT 7.4  --   --   ALBUMIN 3.8 3.0* 3.0*    No results for input(s): "LIPASE", "AMYLASE" in the last 168 hours. No results for input(s): "AMMONIA" in the last 168 hours.  ABG    Component Value Date/Time   PHART 7.49 (H) 12/21/2022 0157  PCO2ART 42 12/21/2022 0157   PO2ART 86 12/21/2022 0157   HCO3 32.0 (H) 12/21/2022 0157   TCO2 39 (H) 01/13/2018 0006   O2SAT 98 12/21/2022 0157     Coagulation Profile: Recent Labs  Lab 12/20/22 0752  INR 1.2     Cardiac Enzymes: No results for input(s): "CKTOTAL", "CKMB", "CKMBINDEX", "TROPONINI" in the last 168 hours.  HbA1C: Hgb A1c MFr Bld  Date/Time Value Ref Range Status  10/02/2022 09:41 AM 7.0 (H) 4.8 - 5.6 % Final    Comment:             Prediabetes: 5.7 - 6.4          Diabetes: >6.4          Glycemic control for adults with diabetes: <7.0   02/23/2022 10:05 PM 6.5 (H) 4.8 - 5.6 % Final    Comment:    (NOTE) Pre diabetes:          5.7%-6.4%  Diabetes:              >6.4%  Glycemic control for   <7.0% adults with diabetes     CBG: Recent Labs  Lab 12/21/22 1546 12/21/22 1930 12/21/22 2309 12/22/22 0356 12/22/22 0725  GLUCAP 212* 200* 199* 119* 145*     Review of Systems:   UTA- patient altered and in respiratory distress, unable to participate in interview at this time.  Past Medical History:  He,  has a past medical history of (HFpEF) heart failure with  preserved ejection fraction (HCC), Allergic reaction (12/15/2016), COPD (chronic obstructive pulmonary disease) (HCC), Hyperglycemia (12/2016), Hypertension, and Polycythemia.   Surgical History:   Past Surgical History:  Procedure Laterality Date   KNEE SURGERY Right      Social History:   reports that he has been smoking cigars. He has never used smokeless tobacco. He reports current alcohol use. He reports that he does not use drugs.   Family History:  His family history includes CAD in his brother and father.   Allergies Allergies  Allergen Reactions   Other Anaphylaxis    McCormick's Rub (Food)   Beef-Derived Products Itching    Alpha gal allergy   Pork-Derived Products Hives    Alpha gal     Home Medications  Prior to Admission medications   Medication Sig Start Date End Date Taking? Authorizing Provider  acetaminophen (TYLENOL) 325 MG tablet Take 2 tablets (650 mg total) by mouth every 4 (four) hours as needed for headache or mild pain. 04/12/22  Yes Esaw Grandchild A, DO  albuterol (VENTOLIN HFA) 108 (90 Base) MCG/ACT inhaler Inhale 2 puffs into the lungs every 4 (four) hours as needed for wheezing or shortness of breath. 11/26/22  Yes Sandre Kitty, MD  atorvastatin (LIPITOR) 20 MG tablet Take 1 tablet (20 mg total) by mouth daily. 11/26/22  Yes Sandre Kitty, MD  docusate sodium (COLACE) 100 MG capsule Take 1 capsule (100 mg total) by mouth 2 (two) times daily as needed for mild constipation. 04/12/22  Yes Esaw Grandchild A, DO  furosemide (LASIX) 40 MG tablet Take 80 mg by mouth daily. 11/29/22  Yes [provider]  LIDODERM 5 % Place 1 patch onto the skin daily. 07/22/22  Yes [provider]  metFORMIN (GLUCOPHAGE) 850 MG tablet Take 1 tablet (850 mg total) by mouth 2 (two) times daily with a meal. 10/21/22  Yes Sandre Kitty, MD  Semaglutide, 1 MG/DOSE, 4 MG/3ML SOPN Inject 1 mg as directed  once a week. 10/21/22  Yes Sandre Kitty, MD  Tiotropium  Bromide-Olodaterol (STIOLTO RESPIMAT) 2.5-2.5 MCG/ACT AERS Inhale 2 puffs into the lungs daily. 10/21/22  Yes Sandre Kitty, MD  Vitamin D, Ergocalciferol, (DRISDOL) 1.25 MG (50000 UNIT) CAPS capsule Take 50,000 Units by mouth once a week. 10/08/22  Yes [provider]  aspirin 81 MG tablet Take 81 mg by mouth daily. Patient not taking: Reported on 10/21/2022    [provider]  atorvastatin (LIPITOR) 10 MG tablet Take 10 mg by mouth at bedtime. Patient not taking: Reported on 12/19/2022 07/22/22   [provider]  blood glucose meter kit and supplies Dispense based on patient and insurance preference. Use up to four times daily as directed. (FOR ICD-10 E10.9, E11.9). 01/15/18   Rodolph Bong, MD  diclofenac Sodium (VOLTAREN) 1 % GEL Apply 2 g topically 3 (three) times daily as needed (pain). Use on knees and other areas of pain Patient not taking: Reported on 10/21/2022 04/12/22   Esaw Grandchild A, DO  empagliflozin (JARDIANCE) 10 MG TABS tablet Take 1 tablet (10 mg total) by mouth daily before breakfast. Patient not taking: Reported on 12/19/2022 11/26/22   Sandre Kitty, MD  EPINEPHrine 0.3 mg/0.3 mL IJ SOAJ injection Inject 0.3 mg into the muscle as needed for anaphylaxis. 09/14/22   Corena Herter, MD  famotidine (PEPCID) 20 MG tablet Take 1 tablet (20 mg total) by mouth daily for 5 days. 09/14/22 09/19/22  Corena Herter, MD  feeding supplement (ENSURE ENLIVE / ENSURE PLUS) LIQD Take 237 mLs by mouth 3 (three) times daily between meals. Patient not taking: Reported on 10/21/2022 04/12/22   Esaw Grandchild A, DO  furosemide (LASIX) 20 MG tablet Take 3 tablets (60 mg total) by mouth 2 (two) times daily. Patient not taking: Reported on 12/19/2022 04/12/22   Esaw Grandchild A, DO  gabapentin (NEURONTIN) 100 MG capsule Take 2 capsules (200 mg total) by mouth 3 (three) times daily. Patient not taking: Reported on 10/21/2022 04/12/22   Esaw Grandchild A, DO  metoprolol tartrate  (LOPRESSOR) 25 MG tablet Take 0.5 tablets (12.5 mg total) by mouth 2 (two) times daily. Patient not taking: Reported on 10/21/2022 04/12/22   Esaw Grandchild A, DO  Multiple Vitamin (MULTIVITAMIN WITH MINERALS) TABS tablet Take 1 tablet by mouth daily. Patient not taking: Reported on 10/21/2022 04/13/22   Esaw Grandchild A, DO  oxyCODONE-acetaminophen (PERCOCET/ROXICET) 5-325 MG tablet Take 1 tablet by mouth every 6 (six) hours as needed for severe pain. Patient not taking: Reported on 10/21/2022 04/12/22   Esaw Grandchild A, DO  tamsulosin (FLOMAX) 0.4 MG CAPS capsule Take 1 capsule (0.4 mg total) by mouth daily. Patient not taking: Reported on 10/21/2022 04/13/22   Pennie Banter, DO     Critical care provider statement:   Total critical care time: 33 minutes   Performed by: Karna Christmas MD   Critical care time was exclusive of separately billable procedures and treating other patients.   Critical care was necessary to treat or prevent imminent or life-threatening deterioration.   Critical care was time spent personally by me on the following activities: development of treatment plan with patient and/or surrogate as well as nursing, discussions with consultants, evaluation of patient's response to treatment, examination of patient, obtaining history from patient or surrogate, ordering and performing treatments and interventions, ordering and review of laboratory studies, ordering and review of radiographic studies, pulse oximetry and re-evaluation of patient's condition.    Vida Rigger, M.D.  Pulmonary & Critical Care Medicine

## 2022-12-22 NOTE — Progress Notes (Signed)
PHARMACY CONSULT NOTE  Pharmacy Consult for Electrolyte Monitoring and Replacement   Recent Labs: Potassium (mmol/L)  Date Value  12/22/2022 4.3  04/21/2012 4.8   Magnesium (mg/dL)  Date Value  16/04/9603 2.1   Calcium (mg/dL)  Date Value  54/03/8118 8.1 (L)   Calcium, Total (mg/dL)  Date Value  14/78/2956 8.8   Albumin (g/dL)  Date Value  21/30/8657 3.0 (L)  10/02/2022 4.2  04/21/2012 4.0   Phosphorus (mg/dL)  Date Value  84/69/6295 5.5 (H)   Sodium (mmol/L)  Date Value  12/22/2022 141  10/02/2022 140  04/21/2012 139     Assessment: 62 y.o. male w/ PMH of morbid obesity, alpha gal allergy, COPD, CHF, diabetes, polycythemia from chronic hypoxia, who presents with COPDe and CHFe. Pharmacy is asked to follow and replace electrolytes while in CCU  Diuretics: IV Lasix 60 mg q12h  Goal of Therapy:  Electrolytes within normal limits  Plan:  --No electrolytes replacement warranted for today --Re-check electrolytes in AM  Tressie Ellis 12/22/2022 7:21 AM

## 2022-12-22 NOTE — Procedures (Signed)
Central Venous Catheter Insertion Procedure Note  Bryan Mitchell  [id number]  04-26-1961  Date:12/22/22  Time:1:35 AM   Provider Performing:Emari Demmer A Horrace Hanak   Procedure: Insertion of Non-tunneled Central Venous Catheter(36556) with US guidance (40981)   Indication(s) Medication administration and Difficult access  Consent Unable to obtain consent due to emergent nature of procedure.  Anesthesia Topical only with 1% lidocaine   Timeout Verified patient identification, verified procedure, site/side was marked, verified correct patient position, special equipment/implants available, medications/allergies/relevant history reviewed, required imaging and test results available.  Sterile Technique Maximal sterile technique including full sterile barrier drape, hand hygiene, sterile gown, sterile gloves, mask, hair covering, sterile ultrasound probe cover (if used).  Procedure Description Area of catheter insertion was cleaned with chlorhexidine and draped in sterile fashion.  With real-time ultrasound guidance a central venous catheter was placed into the left internal jugular vein. Nonpulsatile blood flow and easy flushing noted in all ports.  The catheter was sutured in place and sterile dressing applied.  Complications/Tolerance None; patient tolerated the procedure well. Chest X-ray is ordered to verify placement for internal jugular or subclavian cannulation.   Chest x-ray is not ordered for femoral cannulation.  EBL Minimal  Specimen(s) None  [name], DNP, CCRN, FNP-C, AGACNP-BC Acute Care & Family Nurse Practitioner  [facility]  See Amion for personal pager PCCM on call pager [phone] until 7 am

## 2022-12-23 DIAGNOSIS — J9621 Acute and chronic respiratory failure with hypoxia: Secondary | ICD-10-CM | POA: Diagnosis not present

## 2022-12-23 LAB — BLOOD GAS, ARTERIAL
Acid-Base Excess: 14.5 mmol/L — ABNORMAL HIGH (ref 0.0–2.0)
Acid-Base Excess: 16.7 mmol/L — ABNORMAL HIGH (ref 0.0–2.0)
Acid-Base Excess: 15.8 mmol/L — ABNORMAL HIGH (ref 0.0–2.0)
Acid-Base Excess: 16.8 mmol/L — ABNORMAL HIGH (ref 0.0–2.0)
Bicarbonate: 42.2 mmol/L — ABNORMAL HIGH (ref 20.0–28.0)
Bicarbonate: 43.2 mmol/L — ABNORMAL HIGH (ref 20.0–28.0)
Bicarbonate: 44.1 mmol/L — ABNORMAL HIGH (ref 20.0–28.0)
Bicarbonate: 44.2 mmol/L — ABNORMAL HIGH (ref 20.0–28.0)
FIO2: 100 %
FIO2: 1 %
FIO2: 100 %
FIO2: 100 %
MECHVT: 450 mL
MECHVT: 500 mL
MECHVT: 410 mL
MECHVT: 410 mL
Mechanical Rate: 24
Mechanical Rate: 28
Mechanical Rate: 28
O2 Saturation: 86.2 %
O2 Saturation: 91.7 %
O2 Saturation: 96.4 %
O2 Saturation: 98.5 %
PEEP: 18 cmH2O
PEEP: 14 cmH2O
PEEP: 12 cmH2O
PEEP: 12 cmH2O
Patient temperature: 37
Patient temperature: 37
Patient temperature: 37
Patient temperature: 37
RATE: 24 resp/min
pCO2 arterial: 65 mmHg — ABNORMAL HIGH (ref 32–48)
pCO2 arterial: 58 mmHg — ABNORMAL HIGH (ref 32–48)
pCO2 arterial: 62 mmHg — ABNORMAL HIGH (ref 32–48)
pCO2 arterial: 58 mmHg — ABNORMAL HIGH (ref 32–48)
pH, Arterial: 7.42 (ref 7.35–7.45)
pH, Arterial: 7.48 — ABNORMAL HIGH (ref 7.35–7.45)
pH, Arterial: 7.46 — ABNORMAL HIGH (ref 7.35–7.45)
pH, Arterial: 7.49 — ABNORMAL HIGH (ref 7.35–7.45)
pO2, Arterial: 53 mmHg — ABNORMAL LOW (ref 83–108)
pO2, Arterial: 60 mmHg — ABNORMAL LOW (ref 83–108)
pO2, Arterial: 78 mmHg — ABNORMAL LOW (ref 83–108)
pO2, Arterial: 88 mmHg (ref 83–108)

## 2022-12-23 LAB — CBC
HCT: 60.4 % — ABNORMAL HIGH (ref 39.0–52.0)
Hemoglobin: 17.5 g/dL — ABNORMAL HIGH (ref 13.0–17.0)
MCH: 26.8 pg (ref 26.0–34.0)
MCHC: 29 g/dL — ABNORMAL LOW (ref 30.0–36.0)
MCV: 92.6 fL (ref 80.0–100.0)
Platelets: 79 10*3/uL — ABNORMAL LOW (ref 150–400)
RBC: 6.52 MIL/uL — ABNORMAL HIGH (ref 4.22–5.81)
RDW: 19.1 % — ABNORMAL HIGH (ref 11.5–15.5)
WBC: 6.8 10*3/uL (ref 4.0–10.5)
nRBC: 0 % (ref 0.0–0.2)

## 2022-12-23 LAB — CULTURE, RESPIRATORY W GRAM STAIN

## 2022-12-23 LAB — RENAL FUNCTION PANEL
Albumin: 3.1 g/dL — ABNORMAL LOW (ref 3.5–5.0)
Anion gap: 9 (ref 5–15)
BUN: 48 mg/dL — ABNORMAL HIGH (ref 6–20)
CO2: 35 mmol/L — ABNORMAL HIGH (ref 22–32)
Calcium: 8 mg/dL — ABNORMAL LOW (ref 8.9–10.3)
Chloride: 99 mmol/L (ref 98–111)
Creatinine, Ser: 1.36 mg/dL — ABNORMAL HIGH (ref 0.61–1.24)
GFR, Estimated: 60 mL/min — ABNORMAL LOW (ref >60)
Glucose, Bld: 138 mg/dL — ABNORMAL HIGH (ref 70–99)
Phosphorus: 5.2 mg/dL — ABNORMAL HIGH (ref 2.5–4.6)
Potassium: 3.9 mmol/L (ref 3.5–5.1)
Sodium: 143 mmol/L (ref 135–145)

## 2022-12-23 LAB — GLUCOSE, CAPILLARY
Glucose-Capillary: 118 mg/dL — ABNORMAL HIGH (ref 70–99)
Glucose-Capillary: 136 mg/dL — ABNORMAL HIGH (ref 70–99)
Glucose-Capillary: 182 mg/dL — ABNORMAL HIGH (ref 70–99)
Glucose-Capillary: 197 mg/dL — ABNORMAL HIGH (ref 70–99)
Glucose-Capillary: 207 mg/dL — ABNORMAL HIGH (ref 70–99)

## 2022-12-23 LAB — RESPIRATORY PANEL BY PCR

## 2022-12-23 LAB — PROCALCITONIN: Procalcitonin: 0.23 ng/mL

## 2022-12-23 LAB — TRIGLYCERIDES: Triglycerides: 132 mg/dL (ref <150)

## 2022-12-23 LAB — MAGNESIUM: Magnesium: 2.4 mg/dL (ref 1.7–2.4)

## 2022-12-23 LAB — TROPONIN I (HIGH SENSITIVITY): Troponin I (High Sensitivity): 30 ng/L — ABNORMAL HIGH (ref <18)

## 2022-12-23 MED ORDER — BUDESONIDE 0.5 MG/2ML IN SUSP
0.5000 mg | Freq: Two times a day (BID) | RESPIRATORY_TRACT | Status: DC
  Administered 2022-12-23 – 2022-12-28 (×10): 0.5 mg via RESPIRATORY_TRACT
  Filled 2022-12-23 (×10): qty 2

## 2022-12-23 MED ORDER — FUROSEMIDE 10 MG/ML IJ SOLN: 60.0000 mg | Freq: Every day | INTRAMUSCULAR | Status: DC

## 2022-12-23 NOTE — Progress Notes (Signed)
Prone patient, foam dressing placed to bilateral knee, right elbow for protection.

## 2022-12-23 NOTE — Progress Notes (Signed)
NAME:  Bryan Mitchell, MRN:  [id number], DOB:  04/20/1961, LOS: 4 ADMISSION DATE:  12/19/2022, CONSULTATION DATE:  12/20/22 REFERRING MD:  Cliffton Asters, NP, CHIEF COMPLAINT:  shortness of breath   History of Present Illness:  62 year old male presenting to [facility] ED from home via EMS on 12/19/2022 for evaluation of shortness of breath.  History provided per chart review as patient is in respiratory distress and unable to participate in interview at this time. Patient reported acute onset of worsening dyspnea over the last week with associated orthopnea and paroxysmal nocturnal dyspnea, worsening lower extremity edema and dyspnea on exertion.  He admitted to not taking his Lasix on a regular basis.  He denied fever/chills, but did endorse occasional cough.  Prior to EMS arrival the patient was administered 80 mg of Lasix.  Upon EMS arrival the patient was noted to be satting 80% on his home O2 of 5 L nasal cannula & tachycardic at 151.  EMS transition the patient to NRB and oxygen improved to 96%.  ED course: Upon arrival patient alert and appropriately following commands, reporting his dyspnea feels better than it did at home.  Patient transition to BiPAP support.  Imaging consistent with pulmonary edema and lab work suggestive of CHF exacerbation with an elevated BNP.  Labs also significant for mild hyperkalemia, mildly elevated troponin, AKI, hypochloremia, as well as mild thrombocytopenia.  Blood gas revealed significant respiratory acidosis. TRH consulted for admission due to suspected HFpEF exacerbation with AECOPD. Medications given: 60 mg Lasix Initial Vitals: 97.6, 22, 102, 138/64 and 96% on 60% FiO2 BiPAP Significant labs: (Labs/ Imaging personally reviewed) I, Cheryll Cockayne Rust-Chester, AGACNP-BC, personally viewed and interpreted this ECG. EKG Interpretation: Date: 12/19/2022 EKG Time: 21:36, Rate: 104, Rhythm: ST, QRS Axis: RAD Intervals: RBBB and LPFB, ST/T Wave abnormalities: Diffuse T  wave inversions, Narrative Interpretation: ST with RBBB and LPFB with diffuse T wave inversions  Chemistry: Na+: 137, K+: 5.3, BUN/Cr.:  53/1.26, Serum CO2/ AG: 32/8 Hematology: WBC: 6, Hgb: 18.4, plt: 145 Troponin: 20 > 19, BNP: 478.4, PCT: Pending, COVID-19 & Influenza A/B: Pending  ABG: 7.16/91/77/32.4 >> 7.16/101/68/36  CXR 12/19/2022: Cardiac enlargement with pulmonary vascular congestion, perihilar edema and probable effusions  Patient's respiratory acidosis did not improve after 4 hours on BiPAP with appropriate tidal volumes. PCCM consulted for assistance in management and monitoring due to acute on chronic hypoxic and hypercarbic respiratory failure requiring emergent intubation and mechanical ventilatory support.  Pertinent  Medical History  HFpEF COPD 5 L chronic oxygen HTN Polycythemia T2dm  Significant Hospital Events: Including procedures, antibiotic start and stop dates in addition to other pertinent events   12/19/22: Admitted by [facility] with HFpEF exacerbation and AECOPD.  Overnight respiratory acidosis did not improve on BiPAP support and PCCM urgently consulted for emergent intubation and mechanical ventilatory support due to acute on chronic hypoxic and hypercarbic respiratory failure. 12/21/22: Patient is on 80% PRVC, +foley with lasix non oliguric , sedation with        fentanyl and propofol, off levophed now.   12/22/22: Patient had acute event overnight with severe resp distress        agitation attempt to self extubate.  He is on 100% and in shock requiring        levophed support 12/23/22: Pt severe hypoxia overnight requiring recruitment maneuver's and bagging with peep valve.  Current vent settings: PEEP 14/FiO2 100%.   Micro Data:   COVID 06/14>>negative  MRSA PCR 06/14>>negative  Blood x2 06/16>>negative  Resp (~20 pathogens) 06/16>>negative  Tracheal aspirate>>  Anti-infectives (From admission, onward)    Start     Dose/Rate Route Frequency Ordered Stop    12/21/22 1130  amoxicillin-clavulanate (AUGMENTIN) 875-125 MG per tablet 1 tablet        1 tablet Per Tube Every 12 hours 12/21/22 1031 12/25/22 0959   12/21/22 1130  azithromycin (ZITHROMAX) tablet 500 mg        500 mg Per Tube Daily 12/21/22 1031 12/22/22 0823   12/21/22 1025  cefTRIAXone (ROCEPHIN) 2 g in sodium chloride 0.9 % 100 mL IVPB  Status:  Discontinued        2 g 200 mL/hr over 30 Minutes Intravenous Every 24 hours 12/21/22 1026 12/21/22 1031   12/20/22 1100  azithromycin (ZITHROMAX) 500 mg in sodium chloride 0.9 % 250 mL IVPB  Status:  Discontinued        500 mg 250 mL/hr over 60 Minutes Intravenous Every 24 hours 12/20/22 0920 12/21/22 1031   12/20/22 0900  azithromycin (ZITHROMAX) 500 mg in sodium chloride 0.9 % 250 mL IVPB  Status:  Discontinued        500 mg 250 mL/hr over 60 Minutes Intravenous Every 24 hours 12/20/22 0743 12/20/22 0920   12/20/22 0900  cefTRIAXone (ROCEPHIN) 1 g in sodium chloride 0.9 % 100 mL IVPB  Status:  Discontinued        1 g 200 mL/hr over 30 Minutes Intravenous Every 24 hours 12/20/22 0743 12/21/22 1026      Interim History / Subjective:  As outlined above under significant events   Objective   Blood pressure (!) 78/45, pulse 91, temperature 99.2 F (37.3 C), temperature source Oral, resp. rate 13, height 5\' 8"  (1.727 m), weight (!) 156.2 kg, SpO2 (!) 86 %.    Vent Mode: PRVC FiO2 (%):  [100 %] 100 % Set Rate:  [24 bmp] 24 bmp Vt Set:  [450 mL-500 mL] 500 mL PEEP:  [12 cmH20-14 cmH20] 14 cmH20 Plateau Pressure:  [22 cmH20-25 cmH20] 25 cmH20   Intake/Output Summary (Last 24 hours) at 12/23/2022 0803 Last data filed at 12/23/2022 0430 Gross per 24 hour  Intake 6051.38 ml  Output 4500 ml  Net 1551.38 ml   Filed Weights   12/19/22 2148 12/22/22 0500 12/23/22 0414  Weight: (!) 163.4 kg (!) 158.4 kg (!) 156.2 kg    Examination: General: Acute on chronically-ill appearing male, NAD mechanically intubated  HEENT: Supple, no JVD  Neuro:  Sedated RASS -3, not following commands, PERRL CV: Sinus rhythm with PVC's, no m/r/g, 2+ radial/1+ distal pulses, trace generalized edema Pulm: Faint rhonchi throughout, even, non labored  GI: +BS x4, soft, non distended Skin: Scattered ecchymosis and scabbed abrasions  Extremities: Normal bulk and tone  Resolved Hospital Problem list     Assessment & Plan:  #Acute on chronic hypoxic /hypercapnic respiratory failure multifocal in the setting of suspected pneumonia, HFpEF exacerbation & bilateral pleural effusions  CTA Chest 12/22/22: negative for PE concerning for moderate bilateral pleural effusion  - Full vent support for now: vent settings reviewed and established - Wean PEEP & FiO2 as tolerated, maintain SpO2 > 90% - Head of bed elevated 30 degrees, VAP protocol in place - Plateau pressures less than 30 cm H20  - Intermittent chest x-ray & ABG PRN - Daily WUA with SBT as tolerated  - Ensure adequate pulmonary hygiene  - ABX as outlined above  - Continue iv steroids 40 mg daily wean as tolerated  -  Continue nebulized steroids  - Scheduled and prn bronchodilator therapy  - Will consult IR for thoracentesis   #Acute on chronic HFpEF exacerbation #Hyperlipidemia Echo 12/21/22: EF 60 to 65%, grade I diastolic dysfunction, aortic valve sclerosis present, mildly elevated pulmonary artery systolic pressure   - Continuous cardiac monitoring  - Daily weights to assess volume status - Pt currently net negative 3.7L and with metabolic alkalosis will decrease iv lasix frequency from 60 mg bid to daily for now  -Continue outpatient atorvastatin and aspirin   #Acute kidney injury  #Mild hyperkalemia~resolved Baseline Cr: 0.77, Cr on admission: 1.26 - Trend BMP  - Replace electrolytes as indicated  - Monitor UOP  - Avoid nephrotoxic medications when able   #Thrombocytopenia  - VTE px: subcutaneous arixtra  - Trend CBC - Monitor for s/sx of bleeding  - Transfuse for platelet count of  <20,000  #T2DM - Monitor CBG Q 4 hours - SSI resistant dosing - Target range while in ICU: 140-180 - Follow ICU hyper/hypo-glycemia protocol  #Mechanical intubation pain/discomfort  - Maintain RASS goal of -3 due help with oxygenation  - PAD protocol in place: continue fentanyl & propofol gtts - WUA daily as able    Best Practice (right click and "Reselect all SmartList Selections" daily)  Diet/type: NPO; continue TF's per dietitian recommendations  DVT prophylaxis: subcutaneous arixtra  GI prophylaxis: H2B Lines: Yes and still needed  Foley:  Yes, and it is still needed Code Status:  full code Last date of multidisciplinary goals of care discussion [12/23/2022]  12/23/22: Updated pts sister Chalmers Cater via telephone regarding pts condition and current plan of care.  All questions were answered and Ms. Marca Ancona was appreciative to receive an update Labs   CBC: Recent Labs  Lab 12/19/22 2142 12/20/22 0557 12/21/22 0655 12/22/22 0435 12/23/22 0416  WBC 6.0 5.7 5.4 6.3 6.8  HGB 18.4* 18.8* 19.0* 18.3* 17.5*  HCT 64.3* 66.8* 62.8* 61.9* 60.4*  MCV 94.0 95.4 87.3 89.6 92.6  PLT 145* 116* 94* 84* 79*    Basic Metabolic Panel: Recent Labs  Lab 12/19/22 2142 12/20/22 0557 12/21/22 0655 12/22/22 0435 12/23/22 0416  NA 137 138 141 141 143  K 5.3* 4.8 4.3 4.3 3.9  CL 97* 99 101 100 99  CO2 32 30 29 32 35*  GLUCOSE 132* 123* 186* 135* 138*  BUN 53* 51* 47* 44* 48*  CREATININE 1.26* 1.26* 1.09 1.10 1.36*  CALCIUM 9.0 9.0 8.5* 8.1* 8.0*  MG  --  2.3 2.1  --  2.4  PHOS  --  6.8* 3.5 5.5* 5.2*   GFR: Estimated Creatinine Clearance: 84.6 mL/min (A) (by C-G formula based on SCr of 1.36 mg/dL (H)). Recent Labs  Lab 12/20/22 0557 12/21/22 0655 12/22/22 0435 12/23/22 0416  PROCALCITON 0.62  --   --   --   WBC 5.7 5.4 6.3 6.8    Liver Function Tests: Recent Labs  Lab 12/19/22 2142 12/21/22 0655 12/22/22 0435 12/23/22 0416  AST 23  --   --   --   ALT 15  --   --    --   ALKPHOS 79  --   --   --   BILITOT 1.1  --   --   --   PROT 7.4  --   --   --   ALBUMIN 3.8 3.0* 3.0* 3.1*   No results for input(s): "LIPASE", "AMYLASE" in the last 168 hours. No results for input(s): "AMMONIA" in the last 168  hours.  ABG    Component Value Date/Time   PHART 7.48 (H) 12/23/2022 0741   PCO2ART 58 (H) 12/23/2022 0741   PO2ART 60 (L) 12/23/2022 0741   HCO3 43.2 (H) 12/23/2022 0741   TCO2 39 (H) 01/13/2018 0006   O2SAT 91.7 12/23/2022 0741     Coagulation Profile: Recent Labs  Lab 12/20/22 0752  INR 1.2    Cardiac Enzymes: No results for input(s): "CKTOTAL", "CKMB", "CKMBINDEX", "TROPONINI" in the last 168 hours.  HbA1C: Hgb A1c MFr Bld  Date/Time Value Ref Range Status  10/02/2022 09:41 AM 7.0 (H) 4.8 - 5.6 % Final    Comment:             Prediabetes: 5.7 - 6.4          Diabetes: >6.4          Glycemic control for adults with diabetes: <7.0   02/23/2022 10:05 PM 6.5 (H) 4.8 - 5.6 % Final    Comment:    (NOTE) Pre diabetes:          5.7%-6.4%  Diabetes:              >6.4%  Glycemic control for   <7.0% adults with diabetes     CBG: Recent Labs  Lab 12/22/22 1527 12/22/22 1929 12/22/22 2351 12/23/22 0317 12/23/22 0712  GLUCAP 221* 195* 166* 118* 136*    Review of Systems:   UTA- patient altered and in respiratory distress, unable to participate in interview at this time.  Past Medical History:  He,  has a past medical history of (HFpEF) heart failure with preserved ejection fraction (HCC), Allergic reaction (12/15/2016), COPD (chronic obstructive pulmonary disease) (HCC), Hyperglycemia (12/2016), Hypertension, and Polycythemia.   Surgical History:   Past Surgical History:  Procedure Laterality Date   KNEE SURGERY Right      Social History:   reports that he has been smoking cigars. He has never used smokeless tobacco. He reports current alcohol use. He reports that he does not use drugs.   Family History:  His family  history includes CAD in his brother and father.   Allergies Allergies  Allergen Reactions   Other Anaphylaxis    McCormick's Rub (Food)   Beef-Derived Products Itching    Alpha gal allergy   Pork-Derived Products Hives    Alpha gal     Home Medications  Prior to Admission medications   Medication Sig Start Date End Date Taking? Authorizing Provider  acetaminophen (TYLENOL) 325 MG tablet Take 2 tablets (650 mg total) by mouth every 4 (four) hours as needed for headache or mild pain. 04/12/22  Yes Esaw Grandchild A, DO  albuterol (VENTOLIN HFA) 108 (90 Base) MCG/ACT inhaler Inhale 2 puffs into the lungs every 4 (four) hours as needed for wheezing or shortness of breath. 11/26/22  Yes Sandre Kitty, MD  atorvastatin (LIPITOR) 20 MG tablet Take 1 tablet (20 mg total) by mouth daily. 11/26/22  Yes Sandre Kitty, MD  docusate sodium (COLACE) 100 MG capsule Take 1 capsule (100 mg total) by mouth 2 (two) times daily as needed for mild constipation. 04/12/22  Yes Esaw Grandchild A, DO  furosemide (LASIX) 40 MG tablet Take 80 mg by mouth daily. 11/29/22  Yes [provider]  LIDODERM 5 % Place 1 patch onto the skin daily. 07/22/22  Yes [provider]  metFORMIN (GLUCOPHAGE) 850 MG tablet Take 1 tablet (850 mg total) by mouth 2 (two) times daily with a meal. 10/21/22  Yes Sandre Kitty, MD  Semaglutide, 1 MG/DOSE, 4 MG/3ML SOPN Inject 1 mg as directed once a week. 10/21/22  Yes Sandre Kitty, MD  Tiotropium Bromide-Olodaterol (STIOLTO RESPIMAT) 2.5-2.5 MCG/ACT AERS Inhale 2 puffs into the lungs daily. 10/21/22  Yes Sandre Kitty, MD  Vitamin D, Ergocalciferol, (DRISDOL) 1.25 MG (50000 UNIT) CAPS capsule Take 50,000 Units by mouth once a week. 10/08/22  Yes [provider]  aspirin 81 MG tablet Take 81 mg by mouth daily. Patient not taking: Reported on 10/21/2022    [provider]  atorvastatin (LIPITOR) 10 MG tablet Take 10 mg by mouth at bedtime. Patient not  taking: Reported on 12/19/2022 07/22/22   [provider]  blood glucose meter kit and supplies Dispense based on patient and insurance preference. Use up to four times daily as directed. (FOR ICD-10 E10.9, E11.9). 01/15/18   Rodolph Bong, MD  diclofenac Sodium (VOLTAREN) 1 % GEL Apply 2 g topically 3 (three) times daily as needed (pain). Use on knees and other areas of pain Patient not taking: Reported on 10/21/2022 04/12/22   Esaw Grandchild A, DO  empagliflozin (JARDIANCE) 10 MG TABS tablet Take 1 tablet (10 mg total) by mouth daily before breakfast. Patient not taking: Reported on 12/19/2022 11/26/22   Sandre Kitty, MD  EPINEPHrine 0.3 mg/0.3 mL IJ SOAJ injection Inject 0.3 mg into the muscle as needed for anaphylaxis. 09/14/22   Corena Herter, MD  famotidine (PEPCID) 20 MG tablet Take 1 tablet (20 mg total) by mouth daily for 5 days. 09/14/22 09/19/22  Corena Herter, MD  feeding supplement (ENSURE ENLIVE / ENSURE PLUS) LIQD Take 237 mLs by mouth 3 (three) times daily between meals. Patient not taking: Reported on 10/21/2022 04/12/22   Esaw Grandchild A, DO  furosemide (LASIX) 20 MG tablet Take 3 tablets (60 mg total) by mouth 2 (two) times daily. Patient not taking: Reported on 12/19/2022 04/12/22   Esaw Grandchild A, DO  gabapentin (NEURONTIN) 100 MG capsule Take 2 capsules (200 mg total) by mouth 3 (three) times daily. Patient not taking: Reported on 10/21/2022 04/12/22   Esaw Grandchild A, DO  metoprolol tartrate (LOPRESSOR) 25 MG tablet Take 0.5 tablets (12.5 mg total) by mouth 2 (two) times daily. Patient not taking: Reported on 10/21/2022 04/12/22   Esaw Grandchild A, DO  Multiple Vitamin (MULTIVITAMIN WITH MINERALS) TABS tablet Take 1 tablet by mouth daily. Patient not taking: Reported on 10/21/2022 04/13/22   Esaw Grandchild A, DO  oxyCODONE-acetaminophen (PERCOCET/ROXICET) 5-325 MG tablet Take 1 tablet by mouth every 6 (six) hours as needed for severe pain. Patient not taking: Reported  on 10/21/2022 04/12/22   Esaw Grandchild A, DO  tamsulosin (FLOMAX) 0.4 MG CAPS capsule Take 1 capsule (0.4 mg total) by mouth daily. Patient not taking: Reported on 10/21/2022 04/13/22   Pennie Banter, DO     Critical care time: 45 minutes    Zada Girt, [facility]  Pulmonary/Critical Care Pager [phone] (please enter 7 digits) PCCM Consult Pager [phone] (please enter 7 digits)

## 2022-12-23 NOTE — Progress Notes (Signed)
Patient wih desaturation. Bagged with peep valve in place. Recruitment maneuver performed x 2 mins. O2 saturation remain in low to mid 80's. Peep increased to 18. Abg obtained. Vent setting changed to volume of 500, peep decreased to 14. Patient tolerated interventions well.

## 2022-12-23 NOTE — Procedures (Signed)
Arterial Catheter Insertion Procedure Note  Bryan Mitchell  [id number]  07-18-1960  Date:12/23/22  Time:1:39 PM    Provider Performing: Ezequiel Essex    Procedure: Insertion of Arterial Line (82956) with US guidance (21308)   Indication(s) Blood pressure monitoring and/or need for frequent ABGs  Consent Risks of the procedure as well as the alternatives and risks of each were explained to the patient and/or caregiver.  Consent for the procedure was obtained and is signed in the bedside chart  Anesthesia None   Time Out Verified patient identification, verified procedure, site/side was marked, verified correct patient position, special equipment/implants available, medications/allergies/relevant history reviewed, required imaging and test results available.   Sterile Technique Maximal sterile technique including full sterile barrier drape, hand hygiene, sterile gown, sterile gloves, mask, hair covering, sterile ultrasound probe cover (if used).   Procedure Description Area of catheter insertion was cleaned with chlorhexidine and draped in sterile fashion. With real-time ultrasound guidance an arterial catheter was placed into the right radial artery.  Appropriate arterial tracings confirmed on monitor.     Complications/Tolerance None; patient tolerated the procedure well.   EBL Minimal   Specimen(s) None  Bryan Mitchell, AGNP  Pulmonary/Critical Care Pager [phone] (please enter 7 digits) PCCM Consult Pager [phone] (please enter 7 digits)

## 2022-12-23 NOTE — Progress Notes (Signed)
Patient on vent in prone position. Head turned to left. Airway remains patent. Good return volumes noted. BBS diminished. Recruitment maneuver performed x1 min. Patient tolerated interventions well.

## 2022-12-23 NOTE — Progress Notes (Signed)
PHARMACY CONSULT NOTE  Pharmacy Consult for Electrolyte Monitoring and Replacement   Recent Labs: Potassium (mmol/L)  Date Value  12/23/2022 3.9  04/21/2012 4.8   Magnesium (mg/dL)  Date Value  40/98/1191 2.4   Calcium (mg/dL)  Date Value  47/82/9562 8.0 (L)   Calcium, Total (mg/dL)  Date Value  13/02/6577 8.8   Albumin (g/dL)  Date Value  46/96/2952 3.1 (L)  10/02/2022 4.2  04/21/2012 4.0   Phosphorus (mg/dL)  Date Value  84/13/2440 5.2 (H)   Sodium (mmol/L)  Date Value  12/23/2022 143  10/02/2022 140  04/21/2012 139     Assessment: 62 y.o. male w/ PMH of morbid obesity, alpha gal allergy, COPD, CHF, diabetes, polycythemia from chronic hypoxia, who presents with COPDe and CHFe. Pharmacy is asked to follow and replace electrolytes while in CCU  Diuretics: IV Lasix 60 mg q12h  Goal of Therapy:  Electrolytes within normal limits  Plan:  --No electrolytes replacement warranted for today --Re-check electrolytes in AM  [facility] A [place] Throck[place]on 12/23/2022 7:32 AM

## 2022-12-23 NOTE — Progress Notes (Signed)
Repositioned patient's head to right. Airway remains patent. Able to pass suction catheter. Volumes remain unchanged. Will continue to monitor

## 2022-12-24 ENCOUNTER — Ambulatory Visit: Payer: Medicaid Other | Admitting: Family Medicine

## 2022-12-24 ENCOUNTER — Inpatient Hospital Stay: Payer: Medicaid Other

## 2022-12-24 DIAGNOSIS — J9621 Acute and chronic respiratory failure with hypoxia: Secondary | ICD-10-CM | POA: Diagnosis not present

## 2022-12-24 LAB — BLOOD GAS, ARTERIAL
Acid-Base Excess: 17.1 mmol/L — ABNORMAL HIGH (ref 0.0–2.0)
Acid-Base Excess: 17.6 mmol/L — ABNORMAL HIGH (ref 0.0–2.0)
Acid-Base Excess: 18.7 mmol/L — ABNORMAL HIGH (ref 0.0–2.0)
Bicarbonate: 44.8 mmol/L — ABNORMAL HIGH (ref 20.0–28.0)
Bicarbonate: 46.3 mmol/L — ABNORMAL HIGH (ref 20.0–28.0)
Bicarbonate: 47.1 mmol/L — ABNORMAL HIGH (ref 20.0–28.0)
FIO2: 100 %
FIO2: 100 %
FIO2: 100 %
MECHVT: 410 mL
MECHVT: 410 mL
MECHVT: 410 mL
Mechanical Rate: 28
Mechanical Rate: 28
Mechanical Rate: 28
O2 Saturation: 95.6 %
O2 Saturation: 93.1 %
O2 Saturation: 93.9 %
PEEP: 12 cmH2O
PEEP: 12 cmH2O
PEEP: 14 cmH2O
Patient temperature: 37
Patient temperature: 37
Patient temperature: 37
pCO2 arterial: 66 mmHg (ref 32–48)
pCO2 arterial: 73 mmHg (ref 32–48)
pCO2 arterial: 71 mmHg (ref 32–48)
pH, Arterial: 7.44 (ref 7.35–7.45)
pH, Arterial: 7.41 (ref 7.35–7.45)
pH, Arterial: 7.43 (ref 7.35–7.45)
pO2, Arterial: 73 mmHg — ABNORMAL LOW (ref 83–108)
pO2, Arterial: 66 mmHg — ABNORMAL LOW (ref 83–108)
pO2, Arterial: 68 mmHg — ABNORMAL LOW (ref 83–108)

## 2022-12-24 LAB — MAGNESIUM: Magnesium: 2.5 mg/dL — ABNORMAL HIGH (ref 1.7–2.4)

## 2022-12-24 LAB — RENAL FUNCTION PANEL
Albumin: 2.8 g/dL — ABNORMAL LOW (ref 3.5–5.0)
Anion gap: 9 (ref 5–15)
BUN: 40 mg/dL — ABNORMAL HIGH (ref 6–20)
CO2: 34 mmol/L — ABNORMAL HIGH (ref 22–32)
Calcium: 7.8 mg/dL — ABNORMAL LOW (ref 8.9–10.3)
Chloride: 103 mmol/L (ref 98–111)
Creatinine, Ser: 1.05 mg/dL (ref 0.61–1.24)
GFR, Estimated: >60 mL/min (ref >60)
Glucose, Bld: 129 mg/dL — ABNORMAL HIGH (ref 70–99)
Phosphorus: 4.1 mg/dL (ref 2.5–4.6)
Potassium: 3.7 mmol/L (ref 3.5–5.1)
Sodium: 146 mmol/L — ABNORMAL HIGH (ref 135–145)

## 2022-12-24 LAB — CBC
HCT: 57.1 % — ABNORMAL HIGH (ref 39.0–52.0)
Hemoglobin: 16.6 g/dL (ref 13.0–17.0)
MCH: 26.5 pg (ref 26.0–34.0)
MCHC: 29.1 g/dL — ABNORMAL LOW (ref 30.0–36.0)
MCV: 91.2 fL (ref 80.0–100.0)
Platelets: 75 10*3/uL — ABNORMAL LOW (ref 150–400)
RBC: 6.26 MIL/uL — ABNORMAL HIGH (ref 4.22–5.81)
RDW: 18.6 % — ABNORMAL HIGH (ref 11.5–15.5)
WBC: 6.1 10*3/uL (ref 4.0–10.5)
nRBC: 0.3 % — ABNORMAL HIGH (ref 0.0–0.2)

## 2022-12-24 LAB — GLUCOSE, CAPILLARY
Glucose-Capillary: 146 mg/dL — ABNORMAL HIGH (ref 70–99)
Glucose-Capillary: 153 mg/dL — ABNORMAL HIGH (ref 70–99)
Glucose-Capillary: 153 mg/dL — ABNORMAL HIGH (ref 70–99)
Glucose-Capillary: 166 mg/dL — ABNORMAL HIGH (ref 70–99)
Glucose-Capillary: 192 mg/dL — ABNORMAL HIGH (ref 70–99)
Glucose-Capillary: 228 mg/dL — ABNORMAL HIGH (ref 70–99)
Glucose-Capillary: 243 mg/dL — ABNORMAL HIGH (ref 70–99)

## 2022-12-24 LAB — CULTURE, BLOOD (ROUTINE X 2)
Culture: NO GROWTH
Culture: NO GROWTH

## 2022-12-24 LAB — CULTURE, RESPIRATORY W GRAM STAIN

## 2022-12-24 MED ORDER — POTASSIUM CHLORIDE 20 MEQ PO PACK
40.0000 meq | PACK | Freq: Once | ORAL | Status: AC
  Administered 2022-12-24: 40 meq
  Filled 2022-12-24: qty 2

## 2022-12-24 MED ORDER — PANTOPRAZOLE SODIUM 40 MG IV SOLR
40.0000 mg | Freq: Every day | INTRAVENOUS | Status: DC
  Administered 2022-12-24 – 2022-12-28 (×5): 40 mg via INTRAVENOUS
  Filled 2022-12-24 (×5): qty 10

## 2022-12-24 MED ORDER — BISACODYL 10 MG RE SUPP
10.0000 mg | Freq: Once | RECTAL | Status: AC
  Administered 2022-12-24: 10 mg via RECTAL
  Filled 2022-12-24: qty 1

## 2022-12-24 NOTE — Progress Notes (Signed)
eLink Physician-Brief Progress Note Patient Name: Kuldeep Cotes DOB: 1960/10/28 MRN: [id number]   Date of Service  12/24/2022  HPI/Events of Note  ABG reviewed.  eICU Interventions  Ventilator changes communicated to RT via phone, increase PEEP to 14 and repeat ABG in one hour, if PO2 improved drop FiO2 to  90 % and repeat gas in one hour.        Migdalia Dk 12/24/2022, 8:46 PM

## 2022-12-24 NOTE — Progress Notes (Signed)
NAME:  Bryan Mitchell, MRN:  [id number], DOB:  [birth year], LOS: 5 ADMISSION DATE:  12/19/2022, CONSULTATION DATE:  12/20/22 REFERRING MD:  Cliffton Asters, NP, CHIEF COMPLAINT:  shortness of breath   History of Present Illness:  62 year old male presenting to [facility] ED from home via EMS on 12/19/2022 for evaluation of shortness of breath.  History provided per chart review as patient is in respiratory distress and unable to participate in interview at this time. Patient reported acute onset of worsening dyspnea over the last week with associated orthopnea and paroxysmal nocturnal dyspnea, worsening lower extremity edema and dyspnea on exertion.  He admitted to not taking his Lasix on a regular basis.  He denied fever/chills, but did endorse occasional cough.  Prior to EMS arrival the patient was administered 80 mg of Lasix.  Upon EMS arrival the patient was noted to be satting 80% on his home O2 of 5 L nasal cannula & tachycardic at 151.  EMS transition the patient to NRB and oxygen improved to 96%.  ED course: Upon arrival patient alert and appropriately following commands, reporting his dyspnea feels better than it did at home.  Patient transition to BiPAP support.  Imaging consistent with pulmonary edema and lab work suggestive of CHF exacerbation with an elevated BNP.  Labs also significant for mild hyperkalemia, mildly elevated troponin, AKI, hypochloremia, as well as mild thrombocytopenia.  Blood gas revealed significant respiratory acidosis. TRH consulted for admission due to suspected HFpEF exacerbation with AECOPD. Medications given: 60 mg Lasix Initial Vitals: 97.6, 22, 102, 138/64 and 96% on 60% FiO2 BiPAP Significant labs: (Labs/ Imaging personally reviewed) I, Cheryll Cockayne Rust-Chester, AGACNP-BC, personally viewed and interpreted this ECG. EKG Interpretation: Date: 12/19/2022 EKG Time: 21:36, Rate: 104, Rhythm: ST, QRS Axis: RAD Intervals: RBBB and LPFB, ST/T Wave abnormalities: Diffuse T  wave inversions, Narrative Interpretation: ST with RBBB and LPFB with diffuse T wave inversions  Chemistry: Na+: 137, K+: 5.3, BUN/Cr.:  53/1.26, Serum CO2/ AG: 32/8 Hematology: WBC: 6, Hgb: 18.4, plt: 145 Troponin: 20 > 19, BNP: 478.4, PCT: Pending, COVID-19 & Influenza A/B: Pending  ABG: 7.16/91/77/32.4 >> 7.16/101/68/36  CXR 12/19/2022: Cardiac enlargement with pulmonary vascular congestion, perihilar edema and probable effusions  Patient's respiratory acidosis did not improve after 4 hours on BiPAP with appropriate tidal volumes. PCCM consulted for assistance in management and monitoring due to acute on chronic hypoxic and hypercarbic respiratory failure requiring emergent intubation and mechanical ventilatory support.  Pertinent  Medical History  HFpEF COPD 5 L chronic oxygen HTN Polycythemia T2dm  Significant Hospital Events: Including procedures, antibiotic start and stop dates in addition to other pertinent events   12/19/22: Admitted by [facility] with HFpEF exacerbation and AECOPD.  Overnight respiratory acidosis did not improve on BiPAP support and PCCM urgently consulted for emergent intubation and mechanical ventilatory support due to acute on chronic hypoxic and hypercarbic respiratory failure. 12/21/22: Patient is on 80% PRVC, +foley with lasix non oliguric , sedation with        fentanyl and propofol, off levophed now.   12/22/22: Patient had acute event overnight with severe resp distress        agitation attempt to self extubate.  He is on 100% and in shock requiring        levophed support 12/23/22: Pt severe hypoxia overnight requiring recruitment maneuver's and bagging with peep valve.  Current vent settings: PEEP 14/FiO2 100%. 12/24/22: Pt s/p proning for 18 hrs now in supine position oxygenation slowly improving vent settings: PEEP  12/FiO2 100%.  Will keep in supine position for 6 hrs and than prone for 18hrs today    Micro Data:   COVID 06/14>>negative  MRSA PCR  06/14>>negative  Blood x2 06/16>>negative  Resp (~20 pathogens) 06/16>>negative  Tracheal aspirate>>rare gram negative rods, few wbc present predominantly PMN   Anti-infectives (From admission, onward)    Start     Dose/Rate Route Frequency Ordered Stop   12/21/22 1130  amoxicillin-clavulanate (AUGMENTIN) 875-125 MG per tablet 1 tablet        1 tablet Per Tube Every 12 hours 12/21/22 1031 12/25/22 0959   12/21/22 1130  azithromycin (ZITHROMAX) tablet 500 mg        500 mg Per Tube Daily 12/21/22 1031 12/22/22 0823   12/21/22 1025  cefTRIAXone (ROCEPHIN) 2 g in sodium chloride 0.9 % 100 mL IVPB  Status:  Discontinued        2 g 200 mL/hr over 30 Minutes Intravenous Every 24 hours 12/21/22 1026 12/21/22 1031   12/20/22 1100  azithromycin (ZITHROMAX) 500 mg in sodium chloride 0.9 % 250 mL IVPB  Status:  Discontinued        500 mg 250 mL/hr over 60 Minutes Intravenous Every 24 hours 12/20/22 0920 12/21/22 1031   12/20/22 0900  azithromycin (ZITHROMAX) 500 mg in sodium chloride 0.9 % 250 mL IVPB  Status:  Discontinued        500 mg 250 mL/hr over 60 Minutes Intravenous Every 24 hours 12/20/22 0743 12/20/22 0920   12/20/22 0900  cefTRIAXone (ROCEPHIN) 1 g in sodium chloride 0.9 % 100 mL IVPB  Status:  Discontinued        1 g 200 mL/hr over 30 Minutes Intravenous Every 24 hours 12/20/22 0743 12/21/22 1026      Interim History / Subjective:  As outlined above under significant events   Objective   Blood pressure 119/64, pulse (!) 123, temperature 99.3 F (37.4 C), resp. rate (!) 28, height 5\' 8"  (1.727 m), weight (!) 151.1 kg, SpO2 94 %.    Vent Mode: PRVC FiO2 (%):  [100 %] 100 % Set Rate:  [28 bmp] 28 bmp Vt Set:  [410 mL] 410 mL PEEP:  [12 cmH20] 12 cmH20 Plateau Pressure:  [21 cmH20-23 cmH20] 21 cmH20   Intake/Output Summary (Last 24 hours) at 12/24/2022 1610 Last data filed at 12/24/2022 0600 Gross per 24 hour  Intake 3578.63 ml  Output 3400 ml  Net 178.63 ml   Filed Weights    12/22/22 0500 12/23/22 0414 12/24/22 0409  Weight: (!) 158.4 kg (!) 156.2 kg (!) 151.1 kg    Examination: General: Acute on chronically-ill appearing male, NAD mechanically intubated  HEENT: Supple, no JVD  Neuro: Sedated RASS -3, not following commands, PERRL CV: Sinus rhythm with PVC's, no m/r/g, 2+ radial/2+ distal pulses, trace generalized edema Pulm: Diminished throughout, even, non labored  GI: +BS x4, soft, non distended Skin: Scattered ecchymosis and scabbed abrasions  Extremities: Normal bulk and tone  Resolved Hospital Problem list     Assessment & Plan:  #Acute on chronic hypoxic /hypercapnic respiratory failure multifocal in the setting of suspected pneumonia, HFpEF exacerbation & bilateral pleural effusions  CTA Chest 12/22/22: negative for PE concerning for moderate bilateral pleural effusion  - Full vent support for now: vent settings reviewed and established - Wean PEEP & FiO2 as tolerated, maintain SpO2 > 90% - Head of bed elevated 30 degrees, VAP protocol in place - Plateau pressures less than 30 cm H20  -  Intermittent chest x-ray & ABG PRN - Daily WUA with SBT as tolerated  - Proning for 18 hrs and supine position for 6hrs  - Ensure adequate pulmonary hygiene  - ABX as outlined above  - Continue iv steroids 40 mg daily wean as tolerated  - Continue nebulized steroids  - Scheduled and prn bronchodilator therapy   #Acute on chronic HFpEF exacerbation #Hyperlipidemia Echo 12/21/22: EF 60 to 65%, grade I diastolic dysfunction, aortic valve sclerosis present, mildly elevated pulmonary artery systolic pressure   - Continuous cardiac monitoring  - Daily weights to assess volume status - Pt currently net negative 3.5L and with metabolic alkalosis along with hypernatremia will hold lasix for now  -Continue outpatient atorvastatin and aspirin   #Acute kidney injury  #Mild hypernatremia  Baseline Cr: 0.77, Cr on admission: 1.26 - Trend BMP  - Replace  electrolytes as indicated  - Monitor UOP  - Avoid nephrotoxic medications when able  - Will hold diurese today   #Thrombocytopenia  - VTE px: subcutaneous arixtra  - Trend CBC - Monitor for s/sx of bleeding  - Transfuse for platelet count of <20,000  #T2DM - Monitor CBG Q 4 hours - SSI resistant dosing - Target range while in ICU: 140-180 - Follow ICU hyper/hypo-glycemia protocol  #Mechanical intubation pain/discomfort  - Maintain RASS goal of -3 due help with oxygenation  - PAD protocol in place: continue fentanyl & propofol gtts - WUA daily as able    Best Practice (right click and "Reselect all SmartList Selections" daily)  Diet/type: NPO; continue TF's per dietitian recommendations  DVT prophylaxis: subcutaneous arixtra  GI prophylaxis: H2B Lines: Yes and still needed  Foley:  Yes, and it is still needed Code Status:  full code Last date of multidisciplinary goals of care discussion [12/24/2022]  12/24/22: Updated pts sister Chalmers Cater via telephone regarding pts condition and current plan of care.  All questions were answered and Ms. Marca Ancona was appreciative to receive an update Labs   CBC: Recent Labs  Lab 12/20/22 0557 12/21/22 0655 12/22/22 0435 12/23/22 0416 12/24/22 0255  WBC 5.7 5.4 6.3 6.8 6.1  HGB 18.8* 19.0* 18.3* 17.5* 16.6  HCT 66.8* 62.8* 61.9* 60.4* 57.1*  MCV 95.4 87.3 89.6 92.6 91.2  PLT 116* 94* 84* 79* 75*    Basic Metabolic Panel: Recent Labs  Lab 12/20/22 0557 12/21/22 0655 12/22/22 0435 12/23/22 0416 12/24/22 0255  NA 138 141 141 143 146*  K 4.8 4.3 4.3 3.9 3.7  CL 99 101 100 99 103  CO2 30 29 32 35* 34*  GLUCOSE 123* 186* 135* 138* 129*  BUN 51* 47* 44* 48* 40*  CREATININE 1.26* 1.09 1.10 1.36* 1.05  CALCIUM 9.0 8.5* 8.1* 8.0* 7.8*  MG 2.3 2.1  --  2.4 2.5*  PHOS 6.8* 3.5 5.5* 5.2* 4.1   GFR: Estimated Creatinine Clearance: 107.4 mL/min (by C-G formula based on SCr of 1.05 mg/dL). Recent Labs  Lab 12/20/22 0557  12/21/22 0655 12/22/22 0435 12/23/22 0415 12/23/22 0416 12/24/22 0255  PROCALCITON 0.62  --   --  0.23  --   --   WBC 5.7 5.4 6.3  --  6.8 6.1    Liver Function Tests: Recent Labs  Lab 12/19/22 2142 12/21/22 0655 12/22/22 0435 12/23/22 0416 12/24/22 0255  AST 23  --   --   --   --   ALT 15  --   --   --   --   ALKPHOS 79  --   --   --   --  BILITOT 1.1  --   --   --   --   PROT 7.4  --   --   --   --   ALBUMIN 3.8 3.0* 3.0* 3.1* 2.8*   No results for input(s): "LIPASE", "AMYLASE" in the last 168 hours. No results for input(s): "AMMONIA" in the last 168 hours.  ABG    Component Value Date/Time   PHART 7.44 12/24/2022 0804   PCO2ART 66 (HH) 12/24/2022 0804   PO2ART 73 (L) 12/24/2022 0804   HCO3 44.8 (H) 12/24/2022 0804   TCO2 39 (H) 01/13/2018 0006   O2SAT 95.6 12/24/2022 0804     Coagulation Profile: Recent Labs  Lab 12/20/22 0752  INR 1.2    Cardiac Enzymes: No results for input(s): "CKTOTAL", "CKMB", "CKMBINDEX", "TROPONINI" in the last 168 hours.  HbA1C: Hgb A1c MFr Bld  Date/Time Value Ref Range Status  10/02/2022 09:41 AM 7.0 (H) 4.8 - 5.6 % Final    Comment:             Prediabetes: 5.7 - 6.4          Diabetes: >6.4          Glycemic control for adults with diabetes: <7.0   02/23/2022 10:05 PM 6.5 (H) 4.8 - 5.6 % Final    Comment:    (NOTE) Pre diabetes:          5.7%-6.4%  Diabetes:              >6.4%  Glycemic control for   <7.0% adults with diabetes     CBG: Recent Labs  Lab 12/23/22 1619 12/23/22 1936 12/23/22 2349 12/24/22 0320 12/24/22 0718  GLUCAP 197* 207* 166* 153* 153*    Review of Systems:   UTA- patient altered and in respiratory distress, unable to participate in interview at this time.  Past Medical History:  He,  has a past medical history of (HFpEF) heart failure with preserved ejection fraction (HCC), Allergic reaction (12/15/2016), COPD (chronic obstructive pulmonary disease) (HCC), Hyperglycemia (12/2016),  Hypertension, and Polycythemia.   Surgical History:   Past Surgical History:  Procedure Laterality Date   KNEE SURGERY Right      Social History:   reports that he has been smoking cigars. He has never used smokeless tobacco. He reports current alcohol use. He reports that he does not use drugs.   Family History:  His family history includes CAD in his brother and father.   Allergies Allergies  Allergen Reactions   Other Anaphylaxis    McCormick's Rub (Food)   Beef-Derived Products Itching    Alpha gal allergy   Pork-Derived Products Hives    Alpha gal     Home Medications  Prior to Admission medications   Medication Sig Start Date End Date Taking? Authorizing Provider  acetaminophen (TYLENOL) 325 MG tablet Take 2 tablets (650 mg total) by mouth every 4 (four) hours as needed for headache or mild pain. 04/12/22  Yes Esaw Grandchild A, DO  albuterol (VENTOLIN HFA) 108 (90 Base) MCG/ACT inhaler Inhale 2 puffs into the lungs every 4 (four) hours as needed for wheezing or shortness of breath. 11/26/22  Yes Sandre Kitty, MD  atorvastatin (LIPITOR) 20 MG tablet Take 1 tablet (20 mg total) by mouth daily. 11/26/22  Yes Sandre Kitty, MD  docusate sodium (COLACE) 100 MG capsule Take 1 capsule (100 mg total) by mouth 2 (two) times daily as needed for mild constipation. 04/12/22  Yes Pennie Banter, DO  furosemide (LASIX) 40 MG tablet Take 80 mg by mouth daily. 11/29/22  Yes [provider]  LIDODERM 5 % Place 1 patch onto the skin daily. 07/22/22  Yes [provider]  metFORMIN (GLUCOPHAGE) 850 MG tablet Take 1 tablet (850 mg total) by mouth 2 (two) times daily with a meal. 10/21/22  Yes Sandre Kitty, MD  Semaglutide, 1 MG/DOSE, 4 MG/3ML SOPN Inject 1 mg as directed once a week. 10/21/22  Yes Sandre Kitty, MD  Tiotropium Bromide-Olodaterol (STIOLTO RESPIMAT) 2.5-2.5 MCG/ACT AERS Inhale 2 puffs into the lungs daily. 10/21/22  Yes Sandre Kitty, MD  Vitamin D,  Ergocalciferol, (DRISDOL) 1.25 MG (50000 UNIT) CAPS capsule Take 50,000 Units by mouth once a week. 10/08/22  Yes [provider]  aspirin 81 MG tablet Take 81 mg by mouth daily. Patient not taking: Reported on 10/21/2022    [provider]  atorvastatin (LIPITOR) 10 MG tablet Take 10 mg by mouth at bedtime. Patient not taking: Reported on 12/19/2022 07/22/22   [provider]  blood glucose meter kit and supplies Dispense based on patient and insurance preference. Use up to four times daily as directed. (FOR ICD-10 E10.9, E11.9). 01/15/18   Rodolph Bong, MD  diclofenac Sodium (VOLTAREN) 1 % GEL Apply 2 g topically 3 (three) times daily as needed (pain). Use on knees and other areas of pain Patient not taking: Reported on 10/21/2022 04/12/22   Esaw Grandchild A, DO  empagliflozin (JARDIANCE) 10 MG TABS tablet Take 1 tablet (10 mg total) by mouth daily before breakfast. Patient not taking: Reported on 12/19/2022 11/26/22   Sandre Kitty, MD  EPINEPHrine 0.3 mg/0.3 mL IJ SOAJ injection Inject 0.3 mg into the muscle as needed for anaphylaxis. 09/14/22   Corena Herter, MD  famotidine (PEPCID) 20 MG tablet Take 1 tablet (20 mg total) by mouth daily for 5 days. 09/14/22 09/19/22  Corena Herter, MD  feeding supplement (ENSURE ENLIVE / ENSURE PLUS) LIQD Take 237 mLs by mouth 3 (three) times daily between meals. Patient not taking: Reported on 10/21/2022 04/12/22   Esaw Grandchild A, DO  furosemide (LASIX) 20 MG tablet Take 3 tablets (60 mg total) by mouth 2 (two) times daily. Patient not taking: Reported on 12/19/2022 04/12/22   Esaw Grandchild A, DO  gabapentin (NEURONTIN) 100 MG capsule Take 2 capsules (200 mg total) by mouth 3 (three) times daily. Patient not taking: Reported on 10/21/2022 04/12/22   Esaw Grandchild A, DO  metoprolol tartrate (LOPRESSOR) 25 MG tablet Take 0.5 tablets (12.5 mg total) by mouth 2 (two) times daily. Patient not taking: Reported on 10/21/2022 04/12/22    Esaw Grandchild A, DO  Multiple Vitamin (MULTIVITAMIN WITH MINERALS) TABS tablet Take 1 tablet by mouth daily. Patient not taking: Reported on 10/21/2022 04/13/22   Esaw Grandchild A, DO  oxyCODONE-acetaminophen (PERCOCET/ROXICET) 5-325 MG tablet Take 1 tablet by mouth every 6 (six) hours as needed for severe pain. Patient not taking: Reported on 10/21/2022 04/12/22   Esaw Grandchild A, DO  tamsulosin (FLOMAX) 0.4 MG CAPS capsule Take 1 capsule (0.4 mg total) by mouth daily. Patient not taking: Reported on 10/21/2022 04/13/22   Pennie Banter, DO     Critical care time: 36 minutes    Zada Girt, [facility]  Pulmonary/Critical Care Pager [phone] (please enter 7 digits) PCCM Consult Pager [phone] (please enter 7 digits)

## 2022-12-24 NOTE — Progress Notes (Deleted)
Established Patient Office Visit  Subjective   Patient ID: Bryan Mitchell, male    DOB: [birth year]  Age: 62 y.o. MRN: [id number]  No chief complaint on file.   HPI  Recent CHF exacerbation -   {History (Optional):23778}  ROS    Objective:     There were no vitals taken for this visit. {Vitals History (Optional):23777}  Physical Exam   Results for orders placed or performed during the hospital encounter of 12/19/22  SARS Coronavirus 2 by RT PCR (hospital order, performed in [facility] hospital lab) *cepheid single result test* Anterior Nasal Swab   Specimen: Anterior Nasal Swab  Result Value Ref Range   SARS Coronavirus 2 by RT PCR NEGATIVE NEGATIVE  MRSA Next Gen by PCR, Nasal   Specimen: Nasal Mucosa; Nasal Swab  Result Value Ref Range   MRSA by PCR Next Gen NOT DETECTED NOT DETECTED  Culture, blood (Routine X 2) w Reflex to ID Panel   Specimen: BLOOD LEFT ARM  Result Value Ref Range   Specimen Description BLOOD LEFT ARM    Special Requests      BOTTLES DRAWN AEROBIC AND ANAEROBIC Blood Culture adequate volume   Culture      NO GROWTH 2 DAYS Performed at [facility], [address] [place], [place] [address]    Report Status PENDING   Culture, blood (Routine X 2) w Reflex to ID Panel   Specimen: BLOOD  Result Value Ref Range   Specimen Description BLOOD BLOOD RIGHT HAND    Special Requests      BOTTLES DRAWN AEROBIC AND ANAEROBIC Blood Culture adequate volume   Culture      NO GROWTH 2 DAYS Performed at [facility], [address], [place], [place] [address]    Report Status PENDING   Respiratory (~20 pathogens) panel by PCR   Specimen: Nasopharyngeal Swab; Respiratory  Result Value Ref Range   Adenovirus NOT DETECTED NOT DETECTED   Coronavirus 229E NOT DETECTED NOT DETECTED   Coronavirus HKU1 NOT DETECTED NOT DETECTED   Coronavirus NL63 NOT DETECTED NOT DETECTED   Coronavirus OC43 NOT DETECTED NOT DETECTED   Metapneumovirus  NOT DETECTED NOT DETECTED   Rhinovirus / Enterovirus NOT DETECTED NOT DETECTED   Influenza A NOT DETECTED NOT DETECTED   Influenza B NOT DETECTED NOT DETECTED   Parainfluenza Virus 1 NOT DETECTED NOT DETECTED   Parainfluenza Virus 2 NOT DETECTED NOT DETECTED   Parainfluenza Virus 3 NOT DETECTED NOT DETECTED   Parainfluenza Virus 4 NOT DETECTED NOT DETECTED   Respiratory Syncytial Virus NOT DETECTED NOT DETECTED   Bordetella pertussis NOT DETECTED NOT DETECTED   Bordetella Parapertussis NOT DETECTED NOT DETECTED   Chlamydophila pneumoniae NOT DETECTED NOT DETECTED   Mycoplasma pneumoniae NOT DETECTED NOT DETECTED  Culture, Respiratory w Gram Stain   Specimen: Tracheal Aspirate; Respiratory  Result Value Ref Range   Specimen Description      TRACHEAL ASPIRATE Performed at [facility], [address] [place], [place] [address]    Special Requests      NONE Performed at [facility], [address] [place], [place] [address]    Gram Stain      FEW WBC PRESENT, PREDOMINANTLY PMN RARE GRAM NEGATIVE RODS Performed at [facility], [address] [place], [place] [address]    Culture PENDING    Report Status PENDING   CBC  Result Value Ref Range   WBC 6.0 4.0 - 10.5 K/uL  RBC 6.84 (H) 4.22 - 5.81 MIL/uL   Hemoglobin 18.4 (H) 13.0 - 17.0 g/dL   HCT 16.1 (H) 09.6 - 04.5 %   MCV 94.0 80.0 - 100.0 fL   MCH 26.9 26.0 - 34.0 pg   MCHC 28.6 (L) 30.0 - 36.0 g/dL   RDW 40.9 (H) 81.1 - 91.4 %   Platelets 145 (L) 150 - 400 K/uL   nRBC 1.8 (H) 0.0 - 0.2 %  Comprehensive metabolic panel  Result Value Ref Range   Sodium 137 135 - 145 mmol/L   Potassium 5.3 (H) 3.5 - 5.1 mmol/L   Chloride 97 (L) 98 - 111 mmol/L   CO2 32 22 - 32 mmol/L   Glucose, Bld 132 (H) 70 - 99 mg/dL   BUN 53 (H) 6 - 20 mg/dL   Creatinine, Ser 7.82 (H) 0.61 - 1.24 mg/dL   Calcium 9.0 8.9 - 95.6 mg/dL   Total Protein 7.4 6.5 - 8.1 g/dL   Albumin 3.8 3.5 - 5.0 g/dL   AST 23 15 - 41 U/L    ALT 15 0 - 44 U/L   Alkaline Phosphatase 79 38 - 126 U/L   Total Bilirubin 1.1 0.3 - 1.2 mg/dL   GFR, Estimated >21 >30 mL/min   Anion gap 8 5 - 15  Brain natriuretic peptide  Result Value Ref Range   B Natriuretic Peptide 478.4 (H) 0.0 - 100.0 pg/mL  Urinalysis, Complete w Microscopic -Urine, Clean Catch  Result Value Ref Range   Color, Urine YELLOW (A) YELLOW   APPearance CLEAR (A) CLEAR   Specific Gravity, Urine 1.014 1.005 - 1.030   pH 5.0 5.0 - 8.0   Glucose, UA NEGATIVE NEGATIVE mg/dL   Hgb urine dipstick SMALL (A) NEGATIVE   Bilirubin Urine NEGATIVE NEGATIVE   Ketones, ur NEGATIVE NEGATIVE mg/dL   Protein, ur 30 (A) NEGATIVE mg/dL   Nitrite NEGATIVE NEGATIVE   Leukocytes,Ua NEGATIVE NEGATIVE   RBC / HPF 6-10 0 - 5 RBC/hpf   WBC, UA 0-5 0 - 5 WBC/hpf   Bacteria, UA NONE SEEN NONE SEEN   Squamous Epithelial / HPF 0-5 0 - 5 /HPF   Mucus PRESENT    Hyaline Casts, UA PRESENT   Basic metabolic panel  Result Value Ref Range   Sodium 138 135 - 145 mmol/L   Potassium 4.8 3.5 - 5.1 mmol/L   Chloride 99 98 - 111 mmol/L   CO2 30 22 - 32 mmol/L   Glucose, Bld 123 (H) 70 - 99 mg/dL   BUN 51 (H) 6 - 20 mg/dL   Creatinine, Ser 8.65 (H) 0.61 - 1.24 mg/dL   Calcium 9.0 8.9 - 78.4 mg/dL   GFR, Estimated >69 >62 mL/min   Anion gap 9 5 - 15  CBC  Result Value Ref Range   WBC 5.7 4.0 - 10.5 K/uL   RBC 7.00 (H) 4.22 - 5.81 MIL/uL   Hemoglobin 18.8 (H) 13.0 - 17.0 g/dL   HCT 95.2 (H) 84.1 - 32.4 %   MCV 95.4 80.0 - 100.0 fL   MCH 26.9 26.0 - 34.0 pg   MCHC 28.1 (L) 30.0 - 36.0 g/dL   RDW 40.1 (H) 02.7 - 25.3 %   Platelets 116 (L) 150 - 400 K/uL   nRBC 2.3 (H) 0.0 - 0.2 %  Blood gas, arterial  Result Value Ref Range   FIO2 60 %   Mode BILEVEL POSITIVE AIRWAY PRESSURE    Inspiratory PAP 18 cmH2O   Expiratory  PAP 8 cmH2O   pH, Arterial 7.16 (LL) 7.35 - 7.45   pCO2 arterial 91 (HH) 32 - 48 mmHg   pO2, Arterial 77 (L) 83 - 108 mmHg   Bicarbonate 32.4 (H) 20.0 - 28.0 mmol/L    Acid-Base Excess 0.9 0.0 - 2.0 mmol/L   O2 Saturation 94.2 %   Patient temperature 37.0    Collection site LEFT RADIAL    Allens test (pass/fail) PASS PASS  Blood gas, arterial  Result Value Ref Range   FIO2 50 %   Delivery systems BILEVEL POSITIVE AIRWAY PRESSURE    Mode Average Volume Assured Pressure Support    RATE 24 resp/min   Expiratory PAP 8 cmH2O   pH, Arterial 7.16 (LL) 7.35 - 7.45   pCO2 arterial 101 (HH) 32 - 48 mmHg   pO2, Arterial 68 (L) 83 - 108 mmHg   Bicarbonate 36.0 (H) 20.0 - 28.0 mmol/L   Acid-Base Excess 3.8 (H) 0.0 - 2.0 mmol/L   O2 Saturation 92.4 %   Patient temperature 37.0    Collection site RIGHT RADIAL    Allens test (pass/fail) PASS PASS  Draw ABG 1 hour after initiation of ventilator  Result Value Ref Range   FIO2 90 %   Delivery systems VENTILATOR    Mode PRESSURE REGULATED VOLUME CONTROL    MECHVT 600 mL   RATE 22 resp/min   PEEP 8 cm H20   pH, Arterial 7.4 7.35 - 7.45   pCO2 arterial 53 (H) 32 - 48 mmHg   pO2, Arterial 77 (L) 83 - 108 mmHg   Bicarbonate 32.8 (H) 20.0 - 28.0 mmol/L   Acid-Base Excess 5.8 (H) 0.0 - 2.0 mmol/L   O2 Saturation 96.9 %   Patient temperature 37.0    Collection site LEFT RADIAL    Allens test (pass/fail) PASS PASS  Magnesium  Result Value Ref Range   Magnesium 2.3 1.7 - 2.4 mg/dL  Phosphorus  Result Value Ref Range   Phosphorus 6.8 (H) 2.5 - 4.6 mg/dL  Procalcitonin  Result Value Ref Range   Procalcitonin 0.62 ng/mL  Glucose, capillary  Result Value Ref Range   Glucose-Capillary 118 (H) 70 - 99 mg/dL  Protime-INR  Result Value Ref Range   Prothrombin Time 15.2 11.4 - 15.2 seconds   INR 1.2 0.8 - 1.2  Blood gas, arterial  Result Value Ref Range   FIO2 90 %   Mode PRESSURE REGULATED VOLUME CONTROL    MECHVT 450 mL   RATE 28 resp/min   PEEP 8 cm H20   pH, Arterial 7.43 7.35 - 7.45   pCO2 arterial 48 32 - 48 mmHg   pO2, Arterial 77 (L) 83 - 108 mmHg   Bicarbonate 31.9 (H) 20.0 - 28.0 mmol/L    Acid-Base Excess 6.0 (H) 0.0 - 2.0 mmol/L   O2 Saturation 96.7 %   Patient temperature 37.0    Collection site RIGHT RADIAL    Allens test (pass/fail) PASS PASS  Glucose, capillary  Result Value Ref Range   Glucose-Capillary 133 (H) 70 - 99 mg/dL   Comment 1 Notify RN   Glucose, capillary  Result Value Ref Range   Glucose-Capillary 150 (H) 70 - 99 mg/dL   Comment 1 Notify RN   CBC  Result Value Ref Range   WBC 5.4 4.0 - 10.5 K/uL   RBC 7.19 (H) 4.22 - 5.81 MIL/uL   Hemoglobin 19.0 (H) 13.0 - 17.0 g/dL   HCT 16.1 (H) 09.6 - 04.5 %  MCV 87.3 80.0 - 100.0 fL   MCH 26.4 26.0 - 34.0 pg   MCHC 30.3 30.0 - 36.0 g/dL   RDW 54.0 (H) 98.1 - 19.1 %   Platelets 94 (L) 150 - 400 K/uL   nRBC 0.0 0.0 - 0.2 %  Renal function panel  Result Value Ref Range   Sodium 141 135 - 145 mmol/L   Potassium 4.3 3.5 - 5.1 mmol/L   Chloride 101 98 - 111 mmol/L   CO2 29 22 - 32 mmol/L   Glucose, Bld 186 (H) 70 - 99 mg/dL   BUN 47 (H) 6 - 20 mg/dL   Creatinine, Ser 4.78 0.61 - 1.24 mg/dL   Calcium 8.5 (L) 8.9 - 10.3 mg/dL   Phosphorus 3.5 2.5 - 4.6 mg/dL   Albumin 3.0 (L) 3.5 - 5.0 g/dL   GFR, Estimated >29 >56 mL/min   Anion gap 11 5 - 15  Magnesium  Result Value Ref Range   Magnesium 2.1 1.7 - 2.4 mg/dL  Glucose, capillary  Result Value Ref Range   Glucose-Capillary 147 (H) 70 - 99 mg/dL  Glucose, capillary  Result Value Ref Range   Glucose-Capillary 139 (H) 70 - 99 mg/dL  Glucose, capillary  Result Value Ref Range   Glucose-Capillary 165 (H) 70 - 99 mg/dL  Blood gas, arterial  Result Value Ref Range   FIO2 90 %   Delivery systems VENTILATOR    Mode PRESSURE CONTROL    RATE 24 resp/min   PEEP 8 cm H20   Pressure control 18 cm H20   pH, Arterial 7.49 (H) 7.35 - 7.45   pCO2 arterial 42 32 - 48 mmHg   pO2, Arterial 86 83 - 108 mmHg   Bicarbonate 32.0 (H) 20.0 - 28.0 mmol/L   Acid-Base Excess 7.8 (H) 0.0 - 2.0 mmol/L   O2 Saturation 98 %   Patient temperature 37.0    Collection site  LEFT RADIAL    Allens test (pass/fail) PASS PASS  Glucose, capillary  Result Value Ref Range   Glucose-Capillary 178 (H) 70 - 99 mg/dL  Glucose, capillary  Result Value Ref Range   Glucose-Capillary 181 (H) 70 - 99 mg/dL  Glucose, capillary  Result Value Ref Range   Glucose-Capillary 208 (H) 70 - 99 mg/dL  Glucose, capillary  Result Value Ref Range   Glucose-Capillary 206 (H) 70 - 99 mg/dL  Glucose, capillary  Result Value Ref Range   Glucose-Capillary 212 (H) 70 - 99 mg/dL  CBC  Result Value Ref Range   WBC 6.3 4.0 - 10.5 K/uL   RBC 6.91 (H) 4.22 - 5.81 MIL/uL   Hemoglobin 18.3 (H) 13.0 - 17.0 g/dL   HCT 21.3 (H) 08.6 - 57.8 %   MCV 89.6 80.0 - 100.0 fL   MCH 26.5 26.0 - 34.0 pg   MCHC 29.6 (L) 30.0 - 36.0 g/dL   RDW 46.9 (H) 62.9 - 52.8 %   Platelets 84 (L) 150 - 400 K/uL   nRBC 0.3 (H) 0.0 - 0.2 %  Renal function panel  Result Value Ref Range   Sodium 141 135 - 145 mmol/L   Potassium 4.3 3.5 - 5.1 mmol/L   Chloride 100 98 - 111 mmol/L   CO2 32 22 - 32 mmol/L   Glucose, Bld 135 (H) 70 - 99 mg/dL   BUN 44 (H) 6 - 20 mg/dL   Creatinine, Ser 4.13 0.61 - 1.24 mg/dL   Calcium 8.1 (L) 8.9 - 10.3 mg/dL  Phosphorus 5.5 (H) 2.5 - 4.6 mg/dL   Albumin 3.0 (L) 3.5 - 5.0 g/dL   GFR, Estimated >16 >10 mL/min   Anion gap 9 5 - 15  Glucose, capillary  Result Value Ref Range   Glucose-Capillary 200 (H) 70 - 99 mg/dL  Glucose, capillary  Result Value Ref Range   Glucose-Capillary 199 (H) 70 - 99 mg/dL  Glucose, capillary  Result Value Ref Range   Glucose-Capillary 119 (H) 70 - 99 mg/dL  Glucose, capillary  Result Value Ref Range   Glucose-Capillary 145 (H) 70 - 99 mg/dL  Glucose, capillary  Result Value Ref Range   Glucose-Capillary 152 (H) 70 - 99 mg/dL  Glucose, capillary  Result Value Ref Range   Glucose-Capillary 221 (H) 70 - 99 mg/dL  CBC  Result Value Ref Range   WBC 6.8 4.0 - 10.5 K/uL   RBC 6.52 (H) 4.22 - 5.81 MIL/uL   Hemoglobin 17.5 (H) 13.0 - 17.0 g/dL    HCT 96.0 (H) 45.4 - 52.0 %   MCV 92.6 80.0 - 100.0 fL   MCH 26.8 26.0 - 34.0 pg   MCHC 29.0 (L) 30.0 - 36.0 g/dL   RDW 09.8 (H) 11.9 - 14.7 %   Platelets 79 (L) 150 - 400 K/uL   nRBC 0.0 0.0 - 0.2 %  Renal function panel  Result Value Ref Range   Sodium 143 135 - 145 mmol/L   Potassium 3.9 3.5 - 5.1 mmol/L   Chloride 99 98 - 111 mmol/L   CO2 35 (H) 22 - 32 mmol/L   Glucose, Bld 138 (H) 70 - 99 mg/dL   BUN 48 (H) 6 - 20 mg/dL   Creatinine, Ser 8.29 (H) 0.61 - 1.24 mg/dL   Calcium 8.0 (L) 8.9 - 10.3 mg/dL   Phosphorus 5.2 (H) 2.5 - 4.6 mg/dL   Albumin 3.1 (L) 3.5 - 5.0 g/dL   GFR, Estimated 60 (L) >60 mL/min   Anion gap 9 5 - 15  Triglycerides  Result Value Ref Range   Triglycerides 132 <150 mg/dL  Glucose, capillary  Result Value Ref Range   Glucose-Capillary 195 (H) 70 - 99 mg/dL  Magnesium  Result Value Ref Range   Magnesium 2.4 1.7 - 2.4 mg/dL  Glucose, capillary  Result Value Ref Range   Glucose-Capillary 166 (H) 70 - 99 mg/dL  Glucose, capillary  Result Value Ref Range   Glucose-Capillary 118 (H) 70 - 99 mg/dL  Blood gas, arterial  Result Value Ref Range   FIO2 100 %   MECHVT 450 mL   PEEP 18 cm H20   pH, Arterial 7.42 7.35 - 7.45   pCO2 arterial 65 (H) 32 - 48 mmHg   pO2, Arterial 53 (L) 83 - 108 mmHg   Bicarbonate 42.2 (H) 20.0 - 28.0 mmol/L   Acid-Base Excess 14.5 (H) 0.0 - 2.0 mmol/L   O2 Saturation 86.2 %   Patient temperature 37.0    Allens test (pass/fail) PASS PASS   Mechanical Rate 24   Blood gas, arterial  Result Value Ref Range   FIO2 1.00 %   Delivery systems VENTILATOR    Mode PRESSURE REGULATED VOLUME CONTROL    MECHVT 500 mL   RATE 24 resp/min   PEEP 14.0 cm H20   pH, Arterial 7.48 (H) 7.35 - 7.45   pCO2 arterial 58 (H) 32 - 48 mmHg   pO2, Arterial 60 (L) 83 - 108 mmHg   Bicarbonate 43.2 (H) 20.0 - 28.0 mmol/L  Acid-Base Excess 16.7 (H) 0.0 - 2.0 mmol/L   O2 Saturation 91.7 %   Patient temperature 37.0    Collection site RIGHT  RADIAL    Allens test (pass/fail) PASS PASS  Glucose, capillary  Result Value Ref Range   Glucose-Capillary 136 (H) 70 - 99 mg/dL  Procalcitonin  Result Value Ref Range   Procalcitonin 0.23 ng/mL  Glucose, capillary  Result Value Ref Range   Glucose-Capillary 182 (H) 70 - 99 mg/dL  Blood gas, arterial  Result Value Ref Range   FIO2 100 %   Delivery systems VENTILATOR    Mode PRESSURE REGULATED VOLUME CONTROL    MECHVT 410 mL   PEEP 12 cm H20   pH, Arterial 7.46 (H) 7.35 - 7.45   pCO2 arterial 62 (H) 32 - 48 mmHg   pO2, Arterial 78 (L) 83 - 108 mmHg   Bicarbonate 44.1 (H) 20.0 - 28.0 mmol/L   Acid-Base Excess 15.8 (H) 0.0 - 2.0 mmol/L   O2 Saturation 96.4 %   Patient temperature 37.0    Collection site A-LINE    Mechanical Rate 28   Glucose, capillary  Result Value Ref Range   Glucose-Capillary 197 (H) 70 - 99 mg/dL  Blood gas, arterial  Result Value Ref Range   FIO2 100 %   Delivery systems VENTILATOR    MECHVT 410 mL   PEEP 12 cm H20   pH, Arterial 7.49 (H) 7.35 - 7.45   pCO2 arterial 58 (H) 32 - 48 mmHg   pO2, Arterial 88 83 - 108 mmHg   Bicarbonate 44.2 (H) 20.0 - 28.0 mmol/L   Acid-Base Excess 16.8 (H) 0.0 - 2.0 mmol/L   O2 Saturation 98.5 %   Patient temperature 37.0    Collection site A-LINE    Allens test (pass/fail) PASS PASS   Mechanical Rate 28   CBC  Result Value Ref Range   WBC 6.1 4.0 - 10.5 K/uL   RBC 6.26 (H) 4.22 - 5.81 MIL/uL   Hemoglobin 16.6 13.0 - 17.0 g/dL   HCT 16.1 (H) 09.6 - 04.5 %   MCV 91.2 80.0 - 100.0 fL   MCH 26.5 26.0 - 34.0 pg   MCHC 29.1 (L) 30.0 - 36.0 g/dL   RDW 40.9 (H) 81.1 - 91.4 %   Platelets 75 (L) 150 - 400 K/uL   nRBC 0.3 (H) 0.0 - 0.2 %  Renal function panel  Result Value Ref Range   Sodium 146 (H) 135 - 145 mmol/L   Potassium 3.7 3.5 - 5.1 mmol/L   Chloride 103 98 - 111 mmol/L   CO2 34 (H) 22 - 32 mmol/L   Glucose, Bld 129 (H) 70 - 99 mg/dL   BUN 40 (H) 6 - 20 mg/dL   Creatinine, Ser 7.82 0.61 - 1.24 mg/dL    Calcium 7.8 (L) 8.9 - 10.3 mg/dL   Phosphorus 4.1 2.5 - 4.6 mg/dL   Albumin 2.8 (L) 3.5 - 5.0 g/dL   GFR, Estimated >95 >62 mL/min   Anion gap 9 5 - 15  Magnesium  Result Value Ref Range   Magnesium 2.5 (H) 1.7 - 2.4 mg/dL  Glucose, capillary  Result Value Ref Range   Glucose-Capillary 207 (H) 70 - 99 mg/dL  Glucose, capillary  Result Value Ref Range   Glucose-Capillary 166 (H) 70 - 99 mg/dL  Glucose, capillary  Result Value Ref Range   Glucose-Capillary 153 (H) 70 - 99 mg/dL  Glucose, capillary  Result Value Ref Range  Glucose-Capillary 153 (H) 70 - 99 mg/dL  ECHOCARDIOGRAM COMPLETE  Result Value Ref Range   Weight 5,764.8 oz   Height 68 in   BP 122/62 mmHg   Ao pk vel 1.54 m/s   AR max vel 2.45 cm2   AV Peak grad 9.5 mmHg   S' Lateral 2.70 cm   Area-P 1/2 2.75 cm2   Est EF 60 - 65%   Troponin I (High Sensitivity)  Result Value Ref Range   Troponin I (High Sensitivity) 20 (H) <18 ng/L  Troponin I (High Sensitivity)  Result Value Ref Range   Troponin I (High Sensitivity) 19 (H) <18 ng/L  Troponin I (High Sensitivity)  Result Value Ref Range   Troponin I (High Sensitivity) 30 (H) <18 ng/L    {Labs (Optional):23779}  The 10-year ASCVD risk score (Arnett DK, et al., 2019) is: 34.7%    Assessment & Plan:   Problem List Items Addressed This Visit   None   No follow-ups on file.    Sandre Kitty, MD

## 2022-12-24 NOTE — Progress Notes (Signed)
Pt remained prone entire shift, tolerated well, O2 stats remained 96%. Tube feeds started to come back up out through his mouth around 0100 so tube feeds were held. Pt also remained off Levophed, BP MAP >65 entire shift. Pt due to turn back over at 0700, spoke with Dr.Smith and was advised to wait until after shift change for more resources and to allow more time for the pt to stay in prone position.

## 2022-12-24 NOTE — Progress Notes (Signed)
Repositioned patient's head to left. Vent volumes adequate. Airway patent. Will continue to monitor

## 2022-12-24 NOTE — Progress Notes (Signed)
Nutrition Follow Up Note   DOCUMENTATION CODES:   Morbid obesity  INTERVENTION:   Vital HP @70ml /hr- Initiate at 32ml/hr and increase by 79ml/hr q 6 hours until goal rate was reached.   Free water flushes 30ml q4 hours to maintain tube patency   Regimen provides 1680kcal/day, 147g/day protein and 1577ml/day of free water.   Daily weights   NUTRITION DIAGNOSIS:   Inadequate oral intake related to inability to eat (pt sedated and ventilated) as evidenced by NPO status. -ongoing   GOAL:   Provide needs based on ASPEN/SCCM guidelines -not met   MONITOR:   Vent status, Labs, Weight trends, TF tolerance, I & O's, Skin  ASSESSMENT:   62 y/o male with h/o COPD, DM, HTN, HLD, CHF, polycythemia and alpha gal who is admitted with AKI, CHF and COPD exacerbation  Pt remains sedated and ventilated. Pt is being intermittently proned. OGT in place. Tube feeds held overnight because patient was noted to have tube feeds in his mouth. Tube feeds running at trickle rate this morning; will plan to advance slowly to goal rate. Pepcid changed to protonix for possible GERD. Plan will be reverse trendelenburg the patient while he is proned later this evening. No BM since admission; NP aware and pt is receiving bowel regimen. Plan is for suppository today. Pt with mild hypernatremia today. Per chart, pt is down 8lbs from his UBW. Lasix discontinued today.   Medications reviewed and include: augmentin, aspirin, colace, insulin, solu-medrol, protonix, miralax, propofol   Labs reviewed: Na 146(H), K 3.7 wnl, BUN 40(H), P 4.1 wnl, Mg 2.5(H) Cbgs- 146, 153, 153 x 24 hrs   Patient is currently intubated on ventilator support MV: 11.3 L/min Temp (24hrs), Avg:98.2 F (36.8 C), Min:94.6 F (34.8 C), Max:100.6 F (38.1 C)  Propofol: 38 ml/hr- provides 1003kcal/day   MAP- >58mmHg   UOP-   Diet Order:    Diet Order             Diet NPO time specified  Diet effective now                   EDUCATION NEEDS:   No education needs have been identified at this time  Skin:  Skin Assessment: Reviewed RN Assessment (ecchymosis)  Last BM:  PTA  Height:   Ht Readings from Last 1 Encounters:  12/19/22 5\' 8"  (1.727 m)    Weight:   Wt Readings from Last 1 Encounters:  12/24/22 (!) 151.1 kg    Ideal Body Weight:  70 kg  BMI:  Body mass index is 50.65 kg/m.  Estimated Nutritional Needs:   Kcal:  1540-1750kcal/day  Protein:  150-175g/day  Fluid:  1.8-2.1L/day  Betsey Holiday MS, RD, LDN Please refer to [facility] for RD and/or RD on-call/weekend/after hours pager

## 2022-12-24 NOTE — Progress Notes (Signed)
PHARMACY CONSULT NOTE  Pharmacy Consult for Electrolyte Monitoring and Replacement   Recent Labs: Potassium (mmol/L)  Date Value  12/24/2022 3.7  04/21/2012 4.8   Magnesium (mg/dL)  Date Value  16/04/9603 2.5 (H)   Calcium (mg/dL)  Date Value  54/03/8118 7.8 (L)   Calcium, Total (mg/dL)  Date Value  14/78/2956 8.8   Albumin (g/dL)  Date Value  21/30/8657 2.8 (L)  10/02/2022 4.2  04/21/2012 4.0   Phosphorus (mg/dL)  Date Value  84/69/6295 4.1   Sodium (mmol/L)  Date Value  12/24/2022 146 (H)  10/02/2022 140  04/21/2012 139     Assessment: 62 y.o. male w/ PMH of morbid obesity, alpha gal allergy, COPD, CHF, diabetes, polycythemia from chronic hypoxia, who presents with COPDe and CHFe. Pharmacy is asked to follow and replace electrolytes while in CCU  Diuretics: IV Lasix 60 mg daily  Goal of Therapy:  Electrolytes within normal limits  Plan:  --No electrolytes replacement warranted for today --Re-check electrolytes in AM  [name] 12/24/2022 7:28 AM

## 2022-12-24 NOTE — Progress Notes (Signed)
Repositioned patient's head to the right. Airway patent and volumes in the 400s. 

## 2022-12-24 NOTE — Progress Notes (Signed)
Recruitment maneuver done. Repositioned patient's head to the left. Airway patent and volumes in the 400s.

## 2022-12-24 NOTE — Progress Notes (Signed)
[facility] ADULT ICU REPLACEMENT PROTOCOL   The patient does apply for the [facility] Adult ICU Electrolyte Replacment Protocol based on the criteria listed below:   1.Exclusion criteria: TCTS, ECMO, Dialysis, and Myasthenia Gravis patients 2. Is GFR >/= 30 ml/min? Yes.    Patient's GFR today is >60 3. Is SCr </= 2? Yes.   Patient's SCr is 1.05 mg/dL 4. Did SCr increase >/= 0.5 in 24 hours? No. 5.Pt's weight >40kg  Yes.   6. Abnormal electrolyte(s): Potassium 3.7  7. Electrolytes replaced per protocol 8.  Call MD STAT for K+ </= 2.5, Phos </= 1, or Mag </= 1 Physician:  Dr. Faye Ramsay A Ashwini Jago 12/24/2022 5:15 AM

## 2022-12-25 ENCOUNTER — Inpatient Hospital Stay: Payer: Medicaid Other

## 2022-12-25 DIAGNOSIS — J9622 Acute and chronic respiratory failure with hypercapnia: Secondary | ICD-10-CM

## 2022-12-25 DIAGNOSIS — I493 Ventricular premature depolarization: Secondary | ICD-10-CM | POA: Diagnosis not present

## 2022-12-25 DIAGNOSIS — I5033 Acute on chronic diastolic (congestive) heart failure: Secondary | ICD-10-CM | POA: Diagnosis not present

## 2022-12-25 DIAGNOSIS — J9621 Acute and chronic respiratory failure with hypoxia: Secondary | ICD-10-CM | POA: Diagnosis not present

## 2022-12-25 DIAGNOSIS — E785 Hyperlipidemia, unspecified: Secondary | ICD-10-CM

## 2022-12-25 DIAGNOSIS — R0602 Shortness of breath: Secondary | ICD-10-CM | POA: Diagnosis not present

## 2022-12-25 LAB — BLOOD GAS, ARTERIAL
Acid-Base Excess: 17 mmol/L — ABNORMAL HIGH (ref 0.0–2.0)
Acid-Base Excess: 11.4 mmol/L — ABNORMAL HIGH (ref 0.0–2.0)
Acid-Base Excess: 13.8 mmol/L — ABNORMAL HIGH (ref 0.0–2.0)
Acid-Base Excess: 13 mmol/L — ABNORMAL HIGH (ref 0.0–2.0)
Bicarbonate: 45.8 mmol/L — ABNORMAL HIGH (ref 20.0–28.0)
Bicarbonate: 41.3 mmol/L — ABNORMAL HIGH (ref 20.0–28.0)
Bicarbonate: 43.8 mmol/L — ABNORMAL HIGH (ref 20.0–28.0)
Bicarbonate: 40.6 mmol/L — ABNORMAL HIGH (ref 20.0–28.0)
FIO2: 100 %
FIO2: 100 %
MECHVT: 410 mL
MECHVT: 480 mL
Mechanical Rate: 28
Mechanical Rate: 31
O2 Saturation: 94 %
O2 Saturation: 97.1 %
O2 Saturation: 97.1 %
O2 Saturation: 91.8 %
PEEP: 14 cmH2O
PEEP: 16 cmH2O
Patient temperature: 37
Patient temperature: 37
Patient temperature: 37
Patient temperature: 37
pCO2 arterial: 74 mmHg (ref 32–48)
pCO2 arterial: 82 mmHg (ref 32–48)
pCO2 arterial: 83 mmHg (ref 32–48)
pCO2 arterial: 64 mmHg — ABNORMAL HIGH (ref 32–48)
pH, Arterial: 7.4 (ref 7.35–7.45)
pH, Arterial: 7.31 — ABNORMAL LOW (ref 7.35–7.45)
pH, Arterial: 7.33 — ABNORMAL LOW (ref 7.35–7.45)
pH, Arterial: 7.41 (ref 7.35–7.45)
pO2, Arterial: 68 mmHg — ABNORMAL LOW (ref 83–108)
pO2, Arterial: 84 mmHg (ref 83–108)
pO2, Arterial: 83 mmHg (ref 83–108)
pO2, Arterial: 71 mmHg — ABNORMAL LOW (ref 83–108)

## 2022-12-25 LAB — MAGNESIUM
Magnesium: 2.7 mg/dL — ABNORMAL HIGH (ref 1.7–2.4)
Magnesium: 2.5 mg/dL — ABNORMAL HIGH (ref 1.7–2.4)

## 2022-12-25 LAB — CBC
HCT: 60.8 % — ABNORMAL HIGH (ref 39.0–52.0)
Hemoglobin: 16.7 g/dL (ref 13.0–17.0)
MCH: 26.3 pg (ref 26.0–34.0)
MCHC: 27.5 g/dL — ABNORMAL LOW (ref 30.0–36.0)
MCV: 95.6 fL (ref 80.0–100.0)
Platelets: 77 10*3/uL — ABNORMAL LOW (ref 150–400)
RBC: 6.36 MIL/uL — ABNORMAL HIGH (ref 4.22–5.81)
RDW: 18.9 % — ABNORMAL HIGH (ref 11.5–15.5)
WBC: 8 10*3/uL (ref 4.0–10.5)
nRBC: 0 % (ref 0.0–0.2)

## 2022-12-25 LAB — GLUCOSE, CAPILLARY
Glucose-Capillary: 211 mg/dL — ABNORMAL HIGH (ref 70–99)
Glucose-Capillary: 186 mg/dL — ABNORMAL HIGH (ref 70–99)
Glucose-Capillary: 292 mg/dL — ABNORMAL HIGH (ref 70–99)
Glucose-Capillary: 308 mg/dL — ABNORMAL HIGH (ref 70–99)
Glucose-Capillary: 334 mg/dL — ABNORMAL HIGH (ref 70–99)
Glucose-Capillary: 385 mg/dL — ABNORMAL HIGH (ref 70–99)

## 2022-12-25 LAB — RENAL FUNCTION PANEL
Albumin: 3 g/dL — ABNORMAL LOW (ref 3.5–5.0)
Anion gap: 8 (ref 5–15)
BUN: 27 mg/dL — ABNORMAL HIGH (ref 6–20)
CO2: 35 mmol/L — ABNORMAL HIGH (ref 22–32)
Calcium: 8.5 mg/dL — ABNORMAL LOW (ref 8.9–10.3)
Chloride: 111 mmol/L (ref 98–111)
Creatinine, Ser: 0.78 mg/dL (ref 0.61–1.24)
GFR, Estimated: >60 mL/min (ref >60)
Glucose, Bld: 200 mg/dL — ABNORMAL HIGH (ref 70–99)
Phosphorus: 2.9 mg/dL (ref 2.5–4.6)
Potassium: 3.4 mmol/L — ABNORMAL LOW (ref 3.5–5.1)
Sodium: 154 mmol/L — ABNORMAL HIGH (ref 135–145)

## 2022-12-25 LAB — CULTURE, BLOOD (ROUTINE X 2)

## 2022-12-25 LAB — TROPONIN I (HIGH SENSITIVITY)
Troponin I (High Sensitivity): 33 ng/L — ABNORMAL HIGH (ref <18)
Troponin I (High Sensitivity): 30 ng/L — ABNORMAL HIGH (ref <18)

## 2022-12-25 LAB — BASIC METABOLIC PANEL
Anion gap: 10 (ref 5–15)
BUN: 31 mg/dL — ABNORMAL HIGH (ref 6–20)
CO2: 33 mmol/L — ABNORMAL HIGH (ref 22–32)
Calcium: 8.3 mg/dL — ABNORMAL LOW (ref 8.9–10.3)
Chloride: 105 mmol/L (ref 98–111)
Creatinine, Ser: 0.95 mg/dL (ref 0.61–1.24)
GFR, Estimated: >60 mL/min (ref >60)
Glucose, Bld: 398 mg/dL — ABNORMAL HIGH (ref 70–99)
Potassium: 4.1 mmol/L (ref 3.5–5.1)
Sodium: 148 mmol/L — ABNORMAL HIGH (ref 135–145)

## 2022-12-25 LAB — POTASSIUM: Potassium: 4 mmol/L (ref 3.5–5.1)

## 2022-12-25 LAB — CULTURE, RESPIRATORY W GRAM STAIN

## 2022-12-25 MED ORDER — AMOXICILLIN-POT CLAVULANATE 875-125 MG PO TABS
1.0000 | ORAL_TABLET | Freq: Two times a day (BID) | ORAL | Status: DC
  Administered 2022-12-25 – 2022-12-26 (×3): 1
  Filled 2022-12-25 (×3): qty 1

## 2022-12-25 MED ORDER — FREE WATER
350.0000 mL | Status: DC
  Administered 2022-12-25 – 2022-12-26 (×6): 350 mL

## 2022-12-25 MED ORDER — VECURONIUM BROMIDE 10 MG IV SOLR
INTRAVENOUS | Status: AC
  Filled 2022-12-25: qty 10

## 2022-12-25 MED ORDER — POTASSIUM CHLORIDE 20 MEQ PO PACK
40.0000 meq | PACK | Freq: Once | ORAL | Status: AC
  Administered 2022-12-25: 40 meq
  Filled 2022-12-25: qty 2

## 2022-12-25 MED ORDER — INSULIN ASPART 100 UNIT/ML IJ SOLN
5.0000 [IU] | INTRAMUSCULAR | Status: DC
  Administered 2022-12-25 – 2022-12-26 (×2): 5 [IU] via SUBCUTANEOUS
  Filled 2022-12-25 (×2): qty 1

## 2022-12-25 MED ORDER — VECURONIUM BROMIDE 10 MG IV SOLR: 10.0000 mg | INTRAVENOUS | Status: DC | PRN

## 2022-12-25 MED ORDER — VECURONIUM BROMIDE 10 MG IV SOLR
10.0000 mg | Freq: Once | INTRAVENOUS | Status: AC
  Administered 2022-12-25: 10 mg via INTRAVENOUS

## 2022-12-25 MED ORDER — FUROSEMIDE 10 MG/ML IJ SOLN
60.0000 mg | Freq: Two times a day (BID) | INTRAMUSCULAR | Status: DC
  Administered 2022-12-25 (×2): 60 mg via INTRAVENOUS
  Filled 2022-12-25 (×2): qty 6

## 2022-12-25 MED ORDER — PIVOT 1.5 CAL PO LIQD
1000.0000 mL | ORAL | Status: DC
  Administered 2022-12-25 – 2022-12-28 (×4): 1000 mL
  Filled 2022-12-25: qty 1000

## 2022-12-25 MED ORDER — DEXTROSE 5 % IV SOLN: INTRAVENOUS | Status: DC

## 2022-12-25 MED ORDER — ACETAZOLAMIDE SODIUM 500 MG IJ SOLR
500.0000 mg | Freq: Once | INTRAMUSCULAR | Status: AC
  Administered 2022-12-25: 500 mg via INTRAVENOUS
  Filled 2022-12-25: qty 500

## 2022-12-25 NOTE — Progress Notes (Signed)
PHARMACY CONSULT NOTE  Pharmacy Consult for Electrolyte Monitoring and Replacement   Recent Labs: Potassium (mmol/L)  Date Value  12/25/2022 3.4 (L)  04/21/2012 4.8   Magnesium (mg/dL)  Date Value  16/04/9603 2.7 (H)   Calcium (mg/dL)  Date Value  54/03/8118 8.5 (L)   Calcium, Total (mg/dL)  Date Value  14/78/2956 8.8   Albumin (g/dL)  Date Value  21/30/8657 3.0 (L)  10/02/2022 4.2  04/21/2012 4.0   Phosphorus (mg/dL)  Date Value  84/69/6295 2.9   Sodium (mmol/L)  Date Value  12/25/2022 154 (H)  10/02/2022 140  04/21/2012 139     Assessment: 62 y.o. male w/ PMH of morbid obesity, alpha gal allergy, COPD, CHF, diabetes, polycythemia from chronic hypoxia, who presents with COPDe and CHFe. Pharmacy is asked to follow and replace electrolytes while in CCU  Diuretics: IV Lasix 60 mg daily Diet/Fluids: FWF 30ml q4h  Goal of Therapy:  Electrolytes within normal limits  Plan:  --KCL x 1 --Hypernatremia: started on D5@100ml /hr, FWF increased q4h --Re-check electrolytes in AM  [facility] A Bryan Mitchell 12/25/2022 7:33 AM

## 2022-12-25 NOTE — Progress Notes (Addendum)
NAME:  Bryan Mitchell, MRN:  [id number], DOB:  Nov 14, 1960, LOS: 6 ADMISSION DATE:  12/19/2022, CONSULTATION DATE:  12/20/22 REFERRING MD:  Cliffton Asters, NP, CHIEF COMPLAINT:  shortness of breath   History of Present Illness:  62 year old male presenting to [facility] ED from home via EMS on 12/19/2022 for evaluation of shortness of breath.  History provided per chart review as patient is in respiratory distress and unable to participate in interview at this time. Patient reported acute onset of worsening dyspnea over the last week with associated orthopnea and paroxysmal nocturnal dyspnea, worsening lower extremity edema and dyspnea on exertion.  He admitted to not taking his Lasix on a regular basis.  He denied fever/chills, but did endorse occasional cough.  Prior to EMS arrival the patient was administered 80 mg of Lasix.  Upon EMS arrival the patient was noted to be satting 80% on his home O2 of 5 L nasal cannula & tachycardic at 151.  EMS transition the patient to NRB and oxygen improved to 96%.  ED course: Upon arrival patient alert and appropriately following commands, reporting his dyspnea feels better than it did at home.  Patient transition to BiPAP support.  Imaging consistent with pulmonary edema and lab work suggestive of CHF exacerbation with an elevated BNP.  Labs also significant for mild hyperkalemia, mildly elevated troponin, AKI, hypochloremia, as well as mild thrombocytopenia.  Blood gas revealed significant respiratory acidosis. TRH consulted for admission due to suspected HFpEF exacerbation with AECOPD. Medications given: 60 mg Lasix Initial Vitals: 97.6, 22, 102, 138/64 and 96% on 60% FiO2 BiPAP Significant labs: (Labs/ Imaging personally reviewed) I, Cheryll Cockayne Rust-Chester, AGACNP-BC, personally viewed and interpreted this ECG. EKG Interpretation: Date: 12/19/2022 EKG Time: 21:36, Rate: 104, Rhythm: ST, QRS Axis: RAD Intervals: RBBB and LPFB, ST/T Wave abnormalities: Diffuse T  wave inversions, Narrative Interpretation: ST with RBBB and LPFB with diffuse T wave inversions  Chemistry: Na+: 137, K+: 5.3, BUN/Cr.:  53/1.26, Serum CO2/ AG: 32/8 Hematology: WBC: 6, Hgb: 18.4, plt: 145 Troponin: 20 > 19, BNP: 478.4, PCT: Pending, COVID-19 & Influenza A/B: Pending  ABG: 7.16/91/77/32.4 >> 7.16/101/68/36  CXR 12/19/2022: Cardiac enlargement with pulmonary vascular congestion, perihilar edema and probable effusions  Patient's respiratory acidosis did not improve after 4 hours on BiPAP with appropriate tidal volumes. PCCM consulted for assistance in management and monitoring due to acute on chronic hypoxic and hypercarbic respiratory failure requiring emergent intubation and mechanical ventilatory support.  Pertinent  Medical History  HFpEF COPD 5 L chronic oxygen HTN Polycythemia T2dm  Significant Hospital Events: Including procedures, antibiotic start and stop dates in addition to other pertinent events   12/19/22: Admitted by [facility] with HFpEF exacerbation and AECOPD.  Overnight respiratory acidosis did not improve on BiPAP support and PCCM urgently consulted for emergent intubation and mechanical ventilatory support due to acute on chronic hypoxic and hypercarbic respiratory failure. 12/21/22: Patient is on 80% PRVC, +foley with lasix non oliguric , sedation with        fentanyl and propofol, off levophed now.   12/22/22: Patient had acute event overnight with severe resp distress        agitation attempt to self extubate.  He is on 100% and in shock requiring        levophed support 12/23/22: Pt severe hypoxia overnight requiring recruitment maneuver's and bagging with peep valve.  Current vent settings: PEEP 14/FiO2 100%. 12/24/22: Pt s/p proning for 18 hrs now in supine position oxygenation slowly improving vent settings: PEEP  12/FiO2 100%.  Will keep in supine position for 6 hrs and than prone for 18hrs today  12/25/22: Proned again overnight as ABG did not improve    Micro Data:   COVID 06/14>>negative  MRSA PCR 06/14>>negative  Blood x2 06/16>>negative  Resp (~20 pathogens) 06/16>>negative  Tracheal aspirate>>rare gram negative rods, few wbc present predominantly PMN   Anti-infectives (From admission, onward)    Start     Dose/Rate Route Frequency Ordered Stop   12/21/22 1130  amoxicillin-clavulanate (AUGMENTIN) 875-125 MG per tablet 1 tablet        1 tablet Per Tube Every 12 hours 12/21/22 1031 12/24/22 2241   12/21/22 1130  azithromycin (ZITHROMAX) tablet 500 mg        500 mg Per Tube Daily 12/21/22 1031 12/22/22 0823   12/21/22 1025  cefTRIAXone (ROCEPHIN) 2 g in sodium chloride 0.9 % 100 mL IVPB  Status:  Discontinued        2 g 200 mL/hr over 30 Minutes Intravenous Every 24 hours 12/21/22 1026 12/21/22 1031   12/20/22 1100  azithromycin (ZITHROMAX) 500 mg in sodium chloride 0.9 % 250 mL IVPB  Status:  Discontinued        500 mg 250 mL/hr over 60 Minutes Intravenous Every 24 hours 12/20/22 0920 12/21/22 1031   12/20/22 0900  azithromycin (ZITHROMAX) 500 mg in sodium chloride 0.9 % 250 mL IVPB  Status:  Discontinued        500 mg 250 mL/hr over 60 Minutes Intravenous Every 24 hours 12/20/22 0743 12/20/22 0920   12/20/22 0900  cefTRIAXone (ROCEPHIN) 1 g in sodium chloride 0.9 % 100 mL IVPB  Status:  Discontinued        1 g 200 mL/hr over 30 Minutes Intravenous Every 24 hours 12/20/22 0743 12/21/22 1026      Interim History / Subjective:   Prone for 2 days with no improvement in ABG.  Remains hypoxic with high vent requirements  Objective   Blood pressure 124/61, pulse 86, temperature 98.8 F (37.1 C), resp. rate (!) 28, height 5\' 8"  (1.727 m), weight (!) 148.1 kg, SpO2 (!) 87 %.    Vent Mode: PRVC FiO2 (%):  [100 %] 100 % Set Rate:  [28 bmp] 28 bmp Vt Set:  [410 mL] 410 mL PEEP:  [12 cmH20-14 cmH20] 14 cmH20 Plateau Pressure:  [25 cmH20] 25 cmH20   Intake/Output Summary (Last 24 hours) at 12/25/2022 0835 Last data filed at  12/25/2022 0600 Gross per 24 hour  Intake 3294.21 ml  Output 5120 ml  Net -1825.79 ml   Filed Weights   12/23/22 0414 12/24/22 0409 12/25/22 0500  Weight: (!) 156.2 kg (!) 151.1 kg (!) 148.1 kg    Examination: Gen:      No acute distress, chronically ill-appearing, obese HEENT:  EOMI, sclera anicteric Neck:     No masses; no thyromegaly ETT Lungs:    Clear to auscultation bilaterally; normal respiratory effort CV:         Regular rate and rhythm; no murmurs Abd:      + bowel sounds; soft, non-tender; no palpable masses, no distension Ext:    No edema; adequate peripheral perfusion Skin:      Warm and dry; no rash Neuro: Sedated, unresponsive  Labs/imaging reviewed Significant for sodium increased to 154, potassium 3.4, creatinine 0.78 WBC 8, platelets 77 Glucose 200  Resolved Hospital Problem list     Assessment & Plan:  #Acute on chronic hypoxic /hypercapnic respiratory failure multifocal in  the setting of suspected pneumonia, HFpEF exacerbation & bilateral pleural effusions  CTA Chest 12/22/22: negative for PE concerning for moderate bilateral pleural effusion  Continue full vent support.  Wean PEEP and FiO2 as tolerated He has been prone for 2 days with no change in his ABG.  I do not believe he has any recruitable lung and we will not prone anymore Continue IV steroids, nebulized bronchodilators Continue diuresis as tolerated. Continue augmentin. Has GNR in trach asp culture Follow chest x-ray  #Acute on chronic HFpEF exacerbation #Hyperlipidemia Echo 12/21/22: EF 60 to 65%, grade I diastolic dysfunction, aortic valve sclerosis present, mildly elevated pulmonary artery systolic pressure   - Continuous cardiac monitoring  - Daily weights to assess volume status - Still appears volume overloaded.  Will resume Lasix and give a dose of Diamox today - Pt currently net negative 3.5L and with metabolic alkalosis along with hypernatremia will hold lasix for now  -Continue  outpatient atorvastatin and aspirin   #Acute kidney injury  #Hypernatremia  Baseline Cr: 0.77, Cr on admission: 1.26 - Resume diuresis - Increase free water and start D5 water IV to correct sodium  #Thrombocytopenia  - VTE px: subcutaneous arixtra  - Trend CBC - Monitor for s/sx of bleeding  - Transfuse for platelet count of <20,000  #T2DM - Monitor CBG Q 4 hours - SSI resistant dosing - Target range while in ICU: 140-180 - Follow ICU hyper/hypo-glycemia protocol  #Mechanical intubation pain/discomfort  - Maintain RASS goal of -3 due help with oxygenation  - PAD protocol in place: continue fentanyl & propofol gtts - WUA daily as able    Best Practice (right click and "Reselect all SmartList Selections" daily)  Diet/type: NPO; continue TF's per dietitian recommendations  DVT prophylaxis: subcutaneous arixtra  GI prophylaxis: H2B Lines: Yes and still needed  Foley:  Yes, and it is still needed Code Status:  full code Last date of multidisciplinary goals of care discussion [12/24/2022]  12/24/22: Updated pts sister Chalmers Cater via telephone regarding pts condition and current plan of care.  All questions were answered and Ms. Marca Ancona was appreciative to receive an update  Critical care time:   The patient is critically ill with multiple organ system failure and requires high complexity decision making for assessment and support, frequent evaluation and titration of therapies, advanced monitoring, review of radiographic studies and interpretation of complex data.   Critical Care Time devoted to patient care services, exclusive of separately billable procedures, described in this note is 35 minutes.   Chilton Greathouse MD [facility] See Amion for pager  If no response to pager , please call [phone] until 7pm After 7:00 pm call Elink  [phone] 12/25/2022, 8:35 AM

## 2022-12-25 NOTE — Progress Notes (Signed)
Right posterior proximal leg wound/possible abscess draining serosanguineous fluid from 2 holes in skin. Firm gumball size mass palpated under skin. Image in chart.

## 2022-12-25 NOTE — Inpatient Diabetes Management (Signed)
Inpatient Diabetes Program Recommendations  AACE/ADA: New Consensus Statement on Inpatient Glycemic Control (2015)  Target Ranges:  Prepandial:   less than 140 mg/dL      Peak postprandial:   less than 180 mg/dL (1-2 hours)      Critically ill patients:  140 - 180 mg/dL   Lab Results  Component Value Date   GLUCAP 292 (H) 12/25/2022   HGBA1C 7.0 (H) 10/02/2022    Latest Reference Range & Units 12/24/22 16:05 12/24/22 19:27 12/24/22 23:46 12/25/22 03:46 12/25/22 07:12 12/25/22 11:13  Glucose-Capillary 70 - 99 mg/dL 161 (H) 096 (H) 045 (H) 211 (H) 186 (H) 292 (H)  (H): Data is abnormally high  Diabetes history: DM2 Outpatient Diabetes medications: Ozempic 1 mg weekly, Metformin 850 mg BID  Current orders for Inpatient glycemic control: Novolog 0-20 units q 4 hrs., Solumedrol 40 mg qd  Inpatient Diabetes Program Recommendations:   Currently on tube feed of Vital @ 100 cc/hr and  Tube feed to be changed to -Pivot 1.5 @ 65 ml/hr   Please consider: -Add Novolog1-2 units tube feed coverage q 4 hrs. (Please hold it tube feed held or stopped for any reason).  Thank you, Bryan Mitchell. Mignon Bechler, RN, MSN, CDE  Diabetes Coordinator Inpatient Glycemic Control Team Team Pager [phone] (8am-5pm) 12/25/2022 2:29 PM

## 2022-12-25 NOTE — Progress Notes (Signed)
Repositioned patient's head to the right. Airway patent and volumes in the 400s.

## 2022-12-25 NOTE — Progress Notes (Signed)
Repositioned patient's head to the left. Airway patent and volumes in the 400s.

## 2022-12-25 NOTE — TOC CM/SW Note (Signed)
Transition of Care [facility]) - Inpatient Brief Assessment   Patient Details  Name: Bryan Mitchell MRN: [id number] Date of Birth: [birth year]  Transition of Care [facility] [address]) CM/SW Contact:    Margarito Liner, LCSW Phone Number: 12/25/2022, 12:05 PM   Clinical Narrative: Patient currently on ventilator. Will follow progress and complete readmission prevention screen when appropriate.  Transition of Care Asessment: Insurance and Status: Insurance coverage has been reviewed Patient has primary care physician: Yes Home environment has been reviewed: Single family home Prior level of function:: Not documented Prior/Current Home Services: No current home services Social Determinants of Health Reivew: SDOH reviewed no interventions necessary Readmission risk has been reviewed: Yes Transition of care needs: transition of care needs identified, TOC will continue to follow

## 2022-12-25 NOTE — Consult Note (Signed)
Cardiology Consultation   Patient ID: Bryan Mitchell MRN: [id number]; DOB: April 22, 1961  Admit date: 12/19/2022 Date of Consult: 12/25/2022  PCP:  Bryan Kitty, MD   [facility] Providers Cardiologist:  None      New consult done by Bryan Mitchell  Patient Profile:   Bryan Mitchell is Mitchell 62 y.o. male with Mitchell hx of HFpEF, COPD, hypertension, polycythemia, obesity, chronic peripheral edema, chronic home oxygen, who is being seen 12/25/2022 for the evaluation of ventricular bigeminy at the request of Bryan Asal, NP.  History of Present Illness:  Information found in the medical record as patient is currently intubated and sedated.  Bryan Mitchell patient presents to the [facility] emergency department from home via EMS on 12/19/2022 for worsening shortness of breath.  Patient had reported acute onset of worsening dyspnea over the last week with associated orthopnea and PND, he also noted worsening lower extremity edema and dyspnea on exertion.  He had been admitted to not taking his Lasix on Mitchell regular basis.  He denied any fever or chills but did endorse occasional cough.  Prior to EMS arrival the patient was administered 80 mg of Lasix.  Upon EMS arrival the patient was noted to be satting 80% on his home O2 of 5 L O2 via nasal cannula and was tachycardic at Mitchell rate of 151.  EMS transition the patient to Mitchell nonrebreather with oxygen improvement to 96%.  He then had to be transition to BiPAP for further support.  Initial vitals: Blood pressure 134/73, heart rate of 151, oxygen saturation of 80% on 5 L of O2 via nasal cannula transition to NRB at 15 L with oxygen saturations of 96%  Pertinent labs: Potassium 5.3, chloride of 97, BUN of 53, creatinine 1.26, BNP 470.4, high-sensitivity troponin was 20 and 19, hemoglobin 18.1, hematocrit 64.3, platelets 125, UA positive for protein  Imaging chest x-ray revealed cardiomegaly with pulmonary vascular congestion and perihilar edema with probable pleural  effusions  Medications administered in the emergency department: Prior to arrival he received 80 mg of IV Lasix via EMS and 60 mg of Lasix IVP   PCCM was consulted due to respiratory acidosis without improvement after 4 hours of BiPAP PAP with appropriate tidal volumes which required emergent intubation and mechanical ventilatory support 12/21/2022 the patient was on 80% PRBC, sedation with fentanyl and propofol, Levophed was off. 12/22/2022 patient had acute event overnight with severe respiratory distress, agitation, and attempt to self extubate, he was on 100% FiO2 and in shock requiring Levophed support again 12/23/2022 patient severe hypoxia overnight requiring recruitment maneuvers and bagging with PEEP valve current vent settings continued with 100% FiO2 12/24/2022 patient was proned for 18 hours with oxygenation slowly improving vent settings with FiO2 continued on 100%.  He was continued to be proned for 18 hours daily 12/25/2022 he was proned again overnight but his ABG unfortunately did not improve, continues to have runs of ventricular bigeminy  Cardiology was consulted on 12/25/2022 for concerns of ventricular bigeminy.  Past Medical History:  Diagnosis Date   (HFpEF) heart failure with preserved ejection fraction (HCC)    Allergic reaction 12/15/2016   COPD (chronic obstructive pulmonary disease) (HCC)    Hyperglycemia 12/2016   Hypertension    Polycythemia     Past Surgical History:  Procedure Laterality Date   KNEE SURGERY Right      Home Medications:  Prior to Admission medications   Medication Sig Start Date Mitchell Date Taking? Authorizing Provider  acetaminophen (  TYLENOL) 325 MG tablet Take 2 tablets (650 mg total) by mouth every 4 (four) hours as needed for headache or mild pain. 04/12/22  Yes Bryan Mitchell  albuterol (VENTOLIN HFA) 108 (90 Base) MCG/ACT inhaler Inhale 2 puffs into the lungs every 4 (four) hours as needed for wheezing or shortness of breath. 11/26/22  Yes  Bryan Kitty, MD  atorvastatin (LIPITOR) 20 MG tablet Take 1 tablet (20 mg total) by mouth daily. 11/26/22  Yes Bryan Kitty, MD  docusate sodium (COLACE) 100 MG capsule Take 1 capsule (100 mg total) by mouth 2 (two) times daily as needed for mild constipation. 04/12/22  Yes Bryan Mitchell  furosemide (LASIX) 40 MG tablet Take 80 mg by mouth daily. 11/29/22  Yes [provider]  LIDODERM 5 % Place 1 patch onto the skin daily. 07/22/22  Yes [provider]  metFORMIN (GLUCOPHAGE) 850 MG tablet Take 1 tablet (850 mg total) by mouth 2 (two) times daily with Mitchell meal. 10/21/22  Yes Bryan Kitty, MD  Semaglutide, 1 MG/DOSE, 4 MG/3ML SOPN Inject 1 mg as directed once Mitchell week. 10/21/22  Yes Bryan Kitty, MD  Tiotropium Bromide-Olodaterol (STIOLTO RESPIMAT) 2.5-2.5 MCG/ACT AERS Inhale 2 puffs into the lungs daily. 10/21/22  Yes Bryan Kitty, MD  Vitamin D, Ergocalciferol, (DRISDOL) 1.25 MG (50000 UNIT) CAPS capsule Take 50,000 Units by mouth once Mitchell week. 10/08/22  Yes [provider]  blood glucose meter kit and supplies Dispense based on patient and insurance preference. Use up to four times daily as directed. (FOR ICD-10 E10.9, E11.9). 01/15/18   Bryan Bong, MD  EPINEPHrine 0.3 mg/0.3 mL IJ SOAJ injection Inject 0.3 mg into the muscle as needed for anaphylaxis. 09/14/22   Bryan Herter, MD  metoprolol tartrate (LOPRESSOR) 25 MG tablet Take 0.5 tablets (12.5 mg total) by mouth 2 (two) times daily. Patient not taking: Reported on 10/21/2022 04/12/22   Bryan Banter, Mitchell    Inpatient Medications: Scheduled Meds:  amoxicillin-clavulanate  1 tablet Per Tube Q12H   aspirin  81 mg Per Tube Daily   atorvastatin  20 mg Per Tube Daily   budesonide (PULMICORT) nebulizer solution  0.5 mg Nebulization BID   Chlorhexidine Gluconate Cloth  6 each Topical Daily   docusate  100 mg Per Tube BID   fondaparinux (ARIXTRA) injection  2.5 mg Subcutaneous Q24H   free water  350  mL Per Tube Q4H   furosemide  60 mg Intravenous Q12H   insulin aspart  0-20 Units Subcutaneous Q4H   ipratropium-albuterol  3 mL Nebulization Q6H   methylPREDNISolone (SOLU-MEDROL) injection  40 mg Intravenous Daily   mouth rinse  15 mL Mouth Rinse Q2H   pantoprazole (PROTONIX) IV  40 mg Intravenous Daily   polyethylene glycol  17 g Per Tube Daily   vecuronium       Continuous Infusions:  sodium chloride 10 mL/hr at 12/25/22 0600   sodium chloride Stopped (12/24/22 1653)   dextrose 100 mL/hr at 12/25/22 1010   feeding supplement (PIVOT 1.5 CAL)     feeding supplement (VITAL HIGH PROTEIN) 70 mL/hr at 12/25/22 0600   fentaNYL infusion INTRAVENOUS 350 mcg/hr (12/25/22 0951)   norepinephrine (LEVOPHED) Adult infusion 6 mcg/min (12/25/22 1237)   propofol (DIPRIVAN) infusion 30 mcg/kg/min (12/25/22 1400)   vasopressin Stopped (12/22/22 2017)   PRN Meds: acetaminophen **OR** acetaminophen, albuterol, docusate, EPINEPHrine, fentaNYL, lidocaine, magnesium hydroxide, midazolam, ondansetron **OR** ondansetron (ZOFRAN) IV, traZODone, vecuronium, vecuronium  Allergies:    Allergies  Allergen Reactions   Other Anaphylaxis    McCormick's Rub (Food)   Beef-Derived Products Itching    Alpha gal allergy   Pork-Derived Photographer gal    Social History:   Social History   Socioeconomic History   Marital status: Single    Spouse name: Not on file   Number of children: Not on file   Years of education: Not on file   Highest education level: Not on file  Occupational History   Not on file  Tobacco Use   Smoking status: Every Day    Types: Cigars   Smokeless tobacco: Never  Vaping Use   Vaping Use: Never used  Substance and Sexual Activity   Alcohol use: Yes   Drug use: No   Sexual activity: Not on file  Other Topics Concern   Not on file  Social History Narrative   Not on file   Social Determinants of Health   Financial Resource Strain: Not on file  Food  Insecurity: Not on file  Transportation Needs: Not on file  Physical Activity: Not on file  Stress: Not on file  Social Connections: Not on file  Intimate Partner Violence: Not on file    Family History:    Family History  Problem Relation Age of Onset   CAD Father    CAD Brother      ROS:  Please see the history of present illness.  Review of Systems  Reason unable to perform ROS: Unable to perform due to patient being intubated and sedated.    All other ROS reviewed and negative.     Physical Exam/Data:   Vitals:   12/25/22 1215 12/25/22 1230 12/25/22 1245 12/25/22 1315  BP:      Pulse: (!) 47 (!) 48 (!) 47 (!) 47  Resp: (!) 28 (!) 28 (!) 28 (!) 28  Temp:      TempSrc:      SpO2: 94% 94% 94% 93%  Weight:      Height:        Intake/Output Summary (Last 24 hours) at 12/25/2022 1457 Last data filed at 12/25/2022 1400 Gross per 24 hour  Intake 2382.04 ml  Output 5260 ml  Net -2877.96 ml      12/25/2022    5:00 AM 12/24/2022    4:09 AM 12/23/2022    4:14 AM  Last 3 Weights  Weight (lbs) 326 lb 8 oz 333 lb 1.8 oz 344 lb 5.7 oz  Weight (kg) 148.1 kg 151.1 kg 156.2 kg     Body mass index is 49.64 kg/m.  General: Chronically ill-appearing HEENT: normal Neck: Unable to assess JVD due to body habitus and to follow orders Vascular: No carotid bruits; Distal pulses 2+ bilaterally Cardiac: Distant S1, S2; RRR; no murmur  Lungs: Diminished with scattered rhonchi to auscultation bilaterally, respirations are vent assisted, he remains on 100% FiO2 and 16 of PEEP Abd: soft, nontender, obese, no hepatomegaly  Ext: Trace edema Musculoskeletal:  No deformities, BUE and BLE strength normal and equal Skin: warm and dry  Neuro: Unable to assess due to patient being intubated and sedated Psych: To assess due to patient being intubated and sedated  EKG:  The EKG was personally reviewed and demonstrates: Sinus tachycardia with Mitchell rate of 1 4, right bundle branch block, possible  left atrial enlargement Telemetry:  Telemetry was personally reviewed and demonstrates: Sinus with occasional ventricular bigeminy rates of 80s to  90s  Relevant CV Studies: TTE 12/21/22 1. Left ventricular ejection fraction, by estimation, is 60 to 65%. The  left ventricle has normal function. The left ventricle has no regional  wall motion abnormalities. Left ventricular diastolic parameters are  consistent with Grade I diastolic  dysfunction (impaired relaxation).   2. Right ventricular systolic function is normal. The right ventricular  size is mildly enlarged. There is mildly elevated pulmonary artery  systolic pressure. The estimated right ventricular systolic pressure is  37.7 mmHg.   3. The mitral valve is normal in structure. No evidence of mitral valve  regurgitation. No evidence of mitral stenosis.   4. The aortic valve has an indeterminant number of cusps. Aortic valve  regurgitation is not visualized. Aortic valve sclerosis is present, with  no evidence of aortic valve stenosis.   5. The inferior vena cava is dilated in size with >50% respiratory  variability, suggesting right atrial pressure of 8 mmHg.    Laboratory Data:  High Sensitivity Troponin:   Recent Labs  Lab 12/19/22 2142 12/19/22 2326 12/23/22 1008 12/25/22 1225 12/25/22 1405  TROPONINIHS 20* 19* 30* 33* 30*     Chemistry Recent Labs  Lab 12/23/22 0416 12/24/22 0255 12/25/22 0504 12/25/22 1225  NA 143 146* 154*  --   K 3.9 3.7 3.4* 4.0  CL 99 103 111  --   CO2 35* 34* 35*  --   GLUCOSE 138* 129* 200*  --   BUN 48* 40* 27*  --   CREATININE 1.36* 1.05 0.78  --   CALCIUM 8.0* 7.8* 8.5*  --   MG 2.4 2.5* 2.7* 2.5*  GFRNONAA 60* >60 >60  --   ANIONGAP 9 9 8   --     Recent Labs  Lab 12/19/22 2142 12/21/22 0655 12/23/22 0416 12/24/22 0255 12/25/22 0504  PROT 7.4  --   --   --   --   ALBUMIN 3.8   < > 3.1* 2.8* 3.0*  AST 23  --   --   --   --   ALT 15  --   --   --   --   ALKPHOS 79  --    --   --   --   BILITOT 1.1  --   --   --   --    < > = values in this interval not displayed.   Lipids  Recent Labs  Lab 12/23/22 0416  TRIG 132    Hematology Recent Labs  Lab 12/23/22 0416 12/24/22 0255 12/25/22 0504  WBC 6.8 6.1 8.0  RBC 6.52* 6.26* 6.36*  HGB 17.5* 16.6 16.7  HCT 60.4* 57.1* 60.8*  MCV 92.6 91.2 95.6  MCH 26.8 26.5 26.3  MCHC 29.0* 29.1* 27.5*  RDW 19.1* 18.6* 18.9*  PLT 79* 75* 77*   Thyroid No results for input(s): "TSH", "FREET4" in the last 168 hours.  BNP Recent Labs  Lab 12/19/22 2142  BNP 478.4*    DDimer No results for input(s): "DDIMER" in the last 168 hours.   Radiology/Studies:  DG Chest Port 1 View  Result Date: 12/25/2022 CLINICAL DATA:  Encounter for intubation. EXAM: PORTABLE CHEST 1 VIEW COMPARISON:  Chest x-ray December 24, 2022. FINDINGS: Endotracheal tube tip is approximately 3.1 cm above the carina. Layering bilateral pleural effusions. Overlying bilateral opacities. No visible pneumothorax. Gastric tube courses below diaphragm with the tip likely in the stomach. IMPRESSION: 1. Endotracheal tube tip is approximately 3.1 cm above the carina. 2. Layering bilateral  pleural effusions. Overlying bibasilar opacities could represent atelectasis, aspiration, and/or pneumonia. Electronically Signed   By: Feliberto Harts M.D.   On: 12/25/2022 12:03   DG Chest Port 1 View  Result Date: 12/24/2022 CLINICAL DATA:  Respiratory failure EXAM: PORTABLE CHEST 1 VIEW COMPARISON:  Previous studies including the examination of 12/22/2022 FINDINGS: Transverse diameter of heart is increased. Central pulmonary vessels are prominent. There is haziness in right mid and right lower lung fields. There is increased density in left lower lung field. There is blunting of both lateral CP angles. There is no pneumothorax. Tip of endotracheal tube is 3.4 cm above the carina. Enteric tube is noted traversing the esophagus. Tip of left IJ central venous catheter is seen  in superior vena cava. IMPRESSION: Cardiomegaly. Central pulmonary vessels are prominent. Haziness in right lung may be partly due to layering pleural effusion and underlying atelectasis/pneumonia. There is increased density in left lower lung field suggesting atelectasis and pleural effusion with some interval improvement. Electronically Signed   By: Ernie Avena M.D.   On: 12/24/2022 10:13   CT Angio Chest Pulmonary Embolism (PE) W or WO Contrast  Result Date: 12/22/2022 CLINICAL DATA:  Acute on chronic respiratory failure. Hypoxia. Shortness of breath. High probability for pulmonary embolism. EXAM: CT ANGIOGRAPHY CHEST WITH CONTRAST TECHNIQUE: Multidetector CT imaging of the chest was performed using the standard protocol during bolus administration of intravenous contrast. Multiplanar CT image reconstructions and MIPs were obtained to evaluate the vascular anatomy. RADIATION DOSE REDUCTION: This exam was performed according to the departmental dose-optimization program which includes automated exposure control, adjustment of the mA and/or kV according to patient size and/or use of iterative reconstruction technique. CONTRAST:  155 mL OMNIPAQUE IOHEXOL 350 MG/ML SOLN COMPARISON:  03/03/2022 FINDINGS: Cardiovascular: Satisfactory opacification of pulmonary arteries noted, however the lower lobe pulmonary arteries are not well evaluated due to pulmonary opacity no central pulmonary embolism is seen. No evidence of thoracic aortic dissection or aneurysm. Mediastinum/Nodes: No masses or pathologically enlarged lymph nodes identified. Lungs/Pleura: Endotracheal tube and nasogastric tube are seen in place. Moderate bilateral pleural effusions present with atelectasis or consolidation in both lower lobes. No evidence of central endobronchial obstruction. Upper abdomen: Hepatic cirrhosis and ascites noted. Musculoskeletal: No suspicious bone lesions identified. Review of the MIP images confirms the above  findings. IMPRESSION: Lower lobe pulmonary arteries are not well evaluated due to pulmonary opacity, however there is no evidence of central pulmonary embolism. Moderate bilateral pleural effusions, and bilateral lower lobe atelectasis or consolidation. Hepatic cirrhosis and ascites. Electronically Signed   By: Danae Orleans M.D.   On: 12/22/2022 14:46   DG Chest Port 1 View  Addendum Date: 12/22/2022   ADDENDUM REPORT: 12/22/2022 01:36 ADDENDUM: Voice recognition error occurred in the impression. The impression should read: No pneumothorax following central line placement. Electronically Signed   By: Alcide Clever M.D.   On: 12/22/2022 01:36   Result Date: 12/22/2022 CLINICAL DATA:  Status post central line placement EXAM: PORTABLE CHEST 1 VIEW COMPARISON:  12/21/2022 FINDINGS: Endotracheal tube and gastric catheter are noted in satisfactory position. Left jugular central line is noted in the proximal superior vena cava. No pneumothorax is noted. Persistent left retrocardiac opacification is noted with associated small effusion. Right-sided effusion is seen as well. No bony abnormality is noted. IMPRESSION: Pneumothorax following central line placement. Bilateral effusions and left retrocardiac consolidation. Electronically Signed: By: Alcide Clever M.D. On: 12/22/2022 00:58   DG Abd 1 View  Result Date: 12/22/2022 CLINICAL  DATA:  Abdominal distension EXAM: ABDOMEN - 1 VIEW COMPARISON:  12/20/2022 FINDINGS: Gastric catheter is noted with the tip in the stomach. Proximal side port lies in the distal esophagus. This should be advanced deeper into the stomach. No obstructive changes are noted. IMPRESSION: Gastric catheter as described. This could be advanced deeper into the stomach. Electronically Signed   By: Alcide Clever M.D.   On: 12/22/2022 01:09     Assessment and Plan:   Acute on chronic hypoxic/hypercapnic respiratory failure with multifocal pneumonia and bilateral pleural effusions -CT of the  chest on 6/16 was negative for PE but revealed moderate bilateral pleural effusions -With continued hypoxia requiring high FiO2, once PEEP is weaned down (if possible) may need to consider possible thoracentesis -Continue full ventilator support with PEEP of 16 and FiO2 of 100% -Previously had been present for 2 days with no improvement -Continued on steroids, nebulizers, and diureses as tolerated -Continued on antibiotic therapy with gram-negative rods in sputum -Continued management by PCCM  Acute on chronic HFpEF exacerbation -Presented with worsening shortness of breath -BNP 478.4 -LVEF 60-65%, on last echocardiogram  - -2.8 L output in the last 24 hours, -7 L output since admission -Continued on furosemide 60 mg IV twice daily -Not Mitchell good candidate for SGLT2 inhibitors due due to body habitus and the concern for infection -Can consider MRA once he is off of pressor support -Daily weights, I's and O's  Ventricular bigeminy -Likely secondary to demand from hypoxia, pleural effusions, respiratory failure, multifocal pneumonia, as well as probable heart failure exacerbation -Currently Mitchell not recommend starting amiodarone to suppress PVC's -Maintain potassium greater than 4 less than 5 and magnesium greater than 2 -Continue with telemetry monitoring  Hyperlipidemia -continue atorvastatin   Type 2 diabetes -continued on insulin therapy -management per PCCM    Risk Assessment/Risk Scores:                For questions or updates, please contact [facility] Please consult [email or web address] for contact info under    Signed, Letitia Sabala, NP  12/25/2022 2:57 PM

## 2022-12-25 NOTE — Progress Notes (Signed)
Nutrition Follow-up  DOCUMENTATION CODES:   Morbid obesity  INTERVENTION:   -TF via OGT:   Pivot 1.5 @ 65 ml/hr   350 ml free water flush every 4 hours per MD  Tube feeding regimen provides 2340 kcal (100% of needs), 146 grams of protein, and 1170 ml of H2O. Total free water: 3270 ml daily  -RD discussed pt with DM coordinator via secure chat for further insulin recommendations secondary to hyperglycemia   NUTRITION DIAGNOSIS:   Inadequate oral intake related to inability to eat (pt sedated and ventilated) as evidenced by NPO status.  Ongoing  GOAL:   Provide needs based on ASPEN/SCCM guidelines  Met with TF  MONITOR:   Vent status, Labs, Weight trends, TF tolerance, I & O's, Skin  REASON FOR ASSESSMENT:   Consult Assessment of nutrition requirement/status  ASSESSMENT:   62 y/o male with h/o COPD, DM, HTN, HLD, CHF, polycythemia and alpha gal who is admitted with AKI, CHF and COPD exacerbation  Patient is currently intubated on ventilator support MV: 11.9 L/min Temp (24hrs), Avg:99.5 F (37.5 C), Min:98.8 F (37.1 C), Max:100.8 F (38.2 C)  Reviewed I/O's: -2.2 L x 24 hours and -5.8 L since admission  UOP: 5.1 L x 24 hours  Case discussed with RN, MD, and during ICU rounds. Pt with good output and increased NA. Pt was proned last night, but flipped back over to supine this morning after rounds. Pt is tolerating TF at 70 ml/hr (goal rate). Per RN, pt had a BM last night.   Pt with volume overload; plan to order lasix today. MD increased free water flushes after rounds. Noted pt with increased edema (mild to moderate on exam). RD will also change TF formula to help assist with volume status. Pt unable to be prescribed protein modulars secondary to history of alpha gal.   Suspect steroids may be contributing to hyperglycemia. RD messaged DM coordinator to further insulin recommendations.   Labs reviewed: Na: 154, Mg: 2.5, CBGS: 292 (inpatient orders for  glycemic control are none).    Diet Order:   Diet Order             Diet NPO time specified  Diet effective now                   EDUCATION NEEDS:   No education needs have been identified at this time  Skin:  Skin Assessment: Reviewed RN Assessment (ecchymosis)  Last BM:  12/25/22  Height:   Ht Readings from Last 1 Encounters:  12/19/22 5\' 8"  (1.727 m)    Weight:   Wt Readings from Last 1 Encounters:  12/25/22 (!) 148.1 kg    Ideal Body Weight:  70 kg  BMI:  Body mass index is 49.64 kg/m.  Estimated Nutritional Needs:   Kcal:  1540-1750  Protein:  140-160 grams  Fluid:  1.6-1.8 L    Levada Schilling, RD, LDN, CDCES Registered Dietitian II Certified Diabetes Care and Education Specialist Please refer to [facility] for RD and/or RD on-call/weekend/after hours pager

## 2022-12-25 NOTE — Progress Notes (Signed)
Following pt being placed from prone position back to supine position, pt desaturated with O2 sats down to 80%.  Given paralytic and recruitment maneuver performed on vent with improvement in sats to 90%. PEEP increased to 16 on vent.  CXR obtained showed good ETT positioning, no PTX.   Shortly thereafter, pt again desaturated to about 80%. Required manual bagging with PEEP valve attached (20 PEEP) with improvement of O2 saturations to 92-94%.  Returned to previous vent settings and maintaining O2 saturations of 93%, peak pressures 30 and plateau pressures around 20-25, equal chest rise and equal breath sounds.       Additional Critical Care time: 20 minutes   [name], AGACNP-BC [facility] Prefer epic messenger for cross cover needs If after hours, please call E-link

## 2022-12-25 NOTE — Progress Notes (Signed)
Repositioned patient's head to the left. Airway patent and volumes in the 400s. 

## 2022-12-25 NOTE — Progress Notes (Signed)
Repositioned patient's head to the right. Airway patent and volumes in the 400s. 

## 2022-12-25 NOTE — Progress Notes (Addendum)
Vent adjustments made per verbal order from Dr. Warrick Parisian. Tidal volume increased to 480 and RR increased to 31. ABG to be obtained at 2200.

## 2022-12-25 NOTE — Progress Notes (Addendum)
eLink Physician-Brief Progress Note Patient Name: Bryan Mitchell DOB: April 23, 1961 MRN: [id number]   Date of Service  12/25/2022  HPI/Events of Note  Patient with sub-optimal oxygenation and ventilation on current ventilator settings.  eICU Interventions  Ventilator setting changes made, ABG at 10 pm. Will plan to wean FiO2 as tolerated.        Thomasene Lot Amun Stemm 12/25/2022, 8:15 PM

## 2022-12-25 NOTE — Progress Notes (Signed)
PCCM ECMO Phone Consultation  Case discussed with Dr Isaiah Serge and reviewed by Drs Denese Killings and Chestine Spore.   Patient is not a suitable VV ECMO candidate based on the aggregate of age, morbid obesity and underlying lung disease of uncertain reversibility.   Lynnell Catalan, MD [facility] ICU Physician [facility]  Pager: [phone] Or Epic Secure Chat After hours: [phone].  12/25/2022, 4:47 PM

## 2022-12-25 NOTE — Plan of Care (Signed)
  Problem: Education: Goal: Ability to demonstrate management of disease process will improve Outcome: Not Progressing Goal: Ability to verbalize understanding of medication therapies will improve Outcome: Not Progressing Goal: Individualized Educational Video(s) Outcome: Not Progressing   Problem: Activity: Goal: Capacity to carry out activities will improve Outcome: Not Progressing   Problem: Cardiac: Goal: Ability to achieve and maintain adequate cardiopulmonary perfusion will improve Outcome: Not Progressing   Problem: Activity: Goal: Ability to tolerate increased activity will improve Outcome: Not Progressing   Problem: Respiratory: Goal: Ability to maintain a clear airway and adequate ventilation will improve Outcome: Not Progressing   Problem: Role Relationship: Goal: Method of communication will improve Outcome: Not Progressing   Problem: Education: Goal: Knowledge of General Education information will improve Description: Including pain rating scale, medication(s)/side effects and non-pharmacologic comfort measures Outcome: Not Progressing   Problem: Health Behavior/Discharge Planning: Goal: Ability to manage health-related needs will improve Outcome: Not Progressing   Problem: Clinical Measurements: Goal: Ability to maintain clinical measurements within normal limits will improve Outcome: Not Progressing Goal: Will remain free from infection Outcome: Not Progressing Goal: Diagnostic test results will improve Outcome: Not Progressing Goal: Respiratory complications will improve Outcome: Not Progressing Goal: Cardiovascular complication will be avoided Outcome: Not Progressing   Problem: Activity: Goal: Risk for activity intolerance will decrease Outcome: Not Progressing   Problem: Nutrition: Goal: Adequate nutrition will be maintained Outcome: Not Progressing   Problem: Coping: Goal: Level of anxiety will decrease Outcome: Not Progressing    Problem: Elimination: Goal: Will not experience complications related to bowel motility Outcome: Not Progressing Goal: Will not experience complications related to urinary retention Outcome: Not Progressing   Problem: Pain Managment: Goal: General experience of comfort will improve Outcome: Not Progressing   Problem: Safety: Goal: Ability to remain free from injury will improve Outcome: Not Progressing   Problem: Skin Integrity: Goal: Risk for impaired skin integrity will decrease Outcome: Not Progressing   Problem: Education: Goal: Ability to describe self-care measures that may prevent or decrease complications (Diabetes Survival Skills Education) will improve Outcome: Not Progressing Goal: Individualized Educational Video(s) Outcome: Not Progressing   Problem: Coping: Goal: Ability to adjust to condition or change in health will improve Outcome: Not Progressing   Problem: Fluid Volume: Goal: Ability to maintain a balanced intake and output will improve Outcome: Not Progressing   Problem: Health Behavior/Discharge Planning: Goal: Ability to identify and utilize available resources and services will improve Outcome: Not Progressing Goal: Ability to manage health-related needs will improve Outcome: Not Progressing   Problem: Metabolic: Goal: Ability to maintain appropriate glucose levels will improve Outcome: Not Progressing   Problem: Nutritional: Goal: Maintenance of adequate nutrition will improve Outcome: Not Progressing Goal: Progress toward achieving an optimal weight will improve Outcome: Not Progressing   Problem: Skin Integrity: Goal: Risk for impaired skin integrity will decrease Outcome: Not Progressing   Problem: Tissue Perfusion: Goal: Adequacy of tissue perfusion will improve Outcome: Not Progressing

## 2022-12-26 DIAGNOSIS — R0602 Shortness of breath: Principal | ICD-10-CM

## 2022-12-26 DIAGNOSIS — R000 Tachycardia, unspecified: Secondary | ICD-10-CM

## 2022-12-26 DIAGNOSIS — R0902 Hypoxemia: Secondary | ICD-10-CM

## 2022-12-26 DIAGNOSIS — I509 Heart failure, unspecified: Secondary | ICD-10-CM | POA: Diagnosis not present

## 2022-12-26 DIAGNOSIS — J9621 Acute and chronic respiratory failure with hypoxia: Secondary | ICD-10-CM | POA: Diagnosis not present

## 2022-12-26 DIAGNOSIS — E870 Hyperosmolality and hypernatremia: Secondary | ICD-10-CM | POA: Diagnosis not present

## 2022-12-26 LAB — GLUCOSE, CAPILLARY
Glucose-Capillary: 154 mg/dL — ABNORMAL HIGH (ref 70–99)
Glucose-Capillary: 169 mg/dL — ABNORMAL HIGH (ref 70–99)
Glucose-Capillary: 188 mg/dL — ABNORMAL HIGH (ref 70–99)
Glucose-Capillary: 197 mg/dL — ABNORMAL HIGH (ref 70–99)
Glucose-Capillary: 198 mg/dL — ABNORMAL HIGH (ref 70–99)
Glucose-Capillary: 199 mg/dL — ABNORMAL HIGH (ref 70–99)
Glucose-Capillary: 202 mg/dL — ABNORMAL HIGH (ref 70–99)
Glucose-Capillary: 209 mg/dL — ABNORMAL HIGH (ref 70–99)
Glucose-Capillary: 234 mg/dL — ABNORMAL HIGH (ref 70–99)
Glucose-Capillary: 240 mg/dL — ABNORMAL HIGH (ref 70–99)
Glucose-Capillary: 241 mg/dL — ABNORMAL HIGH (ref 70–99)
Glucose-Capillary: 271 mg/dL — ABNORMAL HIGH (ref 70–99)
Glucose-Capillary: 275 mg/dL — ABNORMAL HIGH (ref 70–99)
Glucose-Capillary: 277 mg/dL — ABNORMAL HIGH (ref 70–99)
Glucose-Capillary: 278 mg/dL — ABNORMAL HIGH (ref 70–99)
Glucose-Capillary: 288 mg/dL — ABNORMAL HIGH (ref 70–99)
Glucose-Capillary: 288 mg/dL — ABNORMAL HIGH (ref 70–99)
Glucose-Capillary: 311 mg/dL — ABNORMAL HIGH (ref 70–99)
Glucose-Capillary: 326 mg/dL — ABNORMAL HIGH (ref 70–99)
Glucose-Capillary: 326 mg/dL — ABNORMAL HIGH (ref 70–99)
Glucose-Capillary: 353 mg/dL — ABNORMAL HIGH (ref 70–99)

## 2022-12-26 LAB — CBC
HCT: 57.5 % — ABNORMAL HIGH (ref 39.0–52.0)
Hemoglobin: 15.8 g/dL (ref 13.0–17.0)
MCH: 26.3 pg (ref 26.0–34.0)
MCHC: 27.5 g/dL — ABNORMAL LOW (ref 30.0–36.0)
MCV: 95.7 fL (ref 80.0–100.0)
Platelets: 105 10*3/uL — ABNORMAL LOW (ref 150–400)
RBC: 6.01 MIL/uL — ABNORMAL HIGH (ref 4.22–5.81)
RDW: 18.9 % — ABNORMAL HIGH (ref 11.5–15.5)
WBC: 8.4 10*3/uL (ref 4.0–10.5)
nRBC: 0 % (ref 0.0–0.2)

## 2022-12-26 LAB — BASIC METABOLIC PANEL
Anion gap: 8 (ref 5–15)
BUN: 33 mg/dL — ABNORMAL HIGH (ref 6–20)
CO2: 37 mmol/L — ABNORMAL HIGH (ref 22–32)
Calcium: 8.7 mg/dL — ABNORMAL LOW (ref 8.9–10.3)
Chloride: 108 mmol/L (ref 98–111)
Creatinine, Ser: 1 mg/dL (ref 0.61–1.24)
GFR, Estimated: >60 mL/min (ref >60)
Glucose, Bld: 341 mg/dL — ABNORMAL HIGH (ref 70–99)
Potassium: 3.7 mmol/L (ref 3.5–5.1)
Sodium: 153 mmol/L — ABNORMAL HIGH (ref 135–145)

## 2022-12-26 LAB — CULTURE, RESPIRATORY W GRAM STAIN

## 2022-12-26 LAB — CULTURE, BLOOD (ROUTINE X 2)
Special Requests: ADEQUATE
Special Requests: ADEQUATE

## 2022-12-26 LAB — TRIGLYCERIDES: Triglycerides: 90 mg/dL (ref <150)

## 2022-12-26 MED ORDER — INSULIN ASPART 100 UNIT/ML IJ SOLN
12.0000 [IU] | Freq: Once | INTRAMUSCULAR | Status: AC
  Administered 2022-12-26: 12 [IU] via SUBCUTANEOUS
  Filled 2022-12-26: qty 1

## 2022-12-26 MED ORDER — FUROSEMIDE 10 MG/ML IJ SOLN
60.0000 mg | Freq: Every day | INTRAMUSCULAR | Status: DC
  Administered 2022-12-26 – 2022-12-27 (×2): 60 mg via INTRAVENOUS
  Filled 2022-12-26 (×2): qty 6

## 2022-12-26 MED ORDER — LINEZOLID 600 MG PO TABS
600.0000 mg | ORAL_TABLET | Freq: Two times a day (BID) | ORAL | Status: DC
  Administered 2022-12-26 – 2022-12-27 (×3): 600 mg
  Filled 2022-12-26 (×4): qty 1

## 2022-12-26 MED ORDER — INSULIN REGULAR(HUMAN) IN NACL 100-0.9 UT/100ML-% IV SOLN
INTRAVENOUS | Status: DC
  Administered 2022-12-26: 29 [IU]/h via INTRAVENOUS
  Administered 2022-12-26: 30 [IU]/h via INTRAVENOUS
  Administered 2022-12-26: 24 [IU]/h via INTRAVENOUS
  Administered 2022-12-26: 30 [IU]/h via INTRAVENOUS
  Administered 2022-12-26: 19 [IU]/h via INTRAVENOUS
  Filled 2022-12-26 (×5): qty 100

## 2022-12-26 MED ORDER — DEXTROSE 50 % IV SOLN: 0.0000 mL | INTRAVENOUS | Status: DC | PRN

## 2022-12-26 MED ORDER — NOREPINEPHRINE 16 MG/250ML-% IV SOLN
2.0000 ug/min | INTRAVENOUS | Status: DC
  Administered 2022-12-26: 13 ug/min via INTRAVENOUS
  Administered 2022-12-27: 5 ug/min via INTRAVENOUS
  Filled 2022-12-26 (×2): qty 250

## 2022-12-26 MED ORDER — FREE WATER
200.0000 mL | Status: DC
  Administered 2022-12-26 – 2022-12-28 (×22): 200 mL

## 2022-12-26 NOTE — Progress Notes (Addendum)
eLink Physician-Brief Progress Note Patient Name: Lily Dutko DOB: 1960/09/22 MRN: [id number]   Date of Service  12/26/2022  HPI/Events of Note  ABG reviewed. Serum Na+ 148, Blood sugar 398.  eICU Interventions  Novolog 12 units SQ x 1 ordered.        Cass Edinger U Tatsuo Musial 12/26/2022, 12:01 AM

## 2022-12-26 NOTE — Progress Notes (Signed)
NAME:  Bryan Mitchell, MRN:  [id number], DOB:  11-13-1960, LOS: 7 ADMISSION DATE:  12/19/2022, CONSULTATION DATE:  12/20/22 REFERRING MD:  Cliffton Asters, NP, CHIEF COMPLAINT:  shortness of breath   History of Present Illness:  62 year old male presenting to [facility] ED from home via EMS on 12/19/2022 for evaluation of shortness of breath.  History provided per chart review as patient is in respiratory distress and unable to participate in interview at this time. Patient reported acute onset of worsening dyspnea over the last week with associated orthopnea and paroxysmal nocturnal dyspnea, worsening lower extremity edema and dyspnea on exertion.  He admitted to not taking his Lasix on a regular basis.  He denied fever/chills, but did endorse occasional cough.  Prior to EMS arrival the patient was administered 80 mg of Lasix.  Upon EMS arrival the patient was noted to be satting 80% on his home O2 of 5 L nasal cannula & tachycardic at 151.  EMS transition the patient to NRB and oxygen improved to 96%.  ED course: Upon arrival patient alert and appropriately following commands, reporting his dyspnea feels better than it did at home.  Patient transition to BiPAP support.  Imaging consistent with pulmonary edema and lab work suggestive of CHF exacerbation with an elevated BNP.  Labs also significant for mild hyperkalemia, mildly elevated troponin, AKI, hypochloremia, as well as mild thrombocytopenia.  Blood gas revealed significant respiratory acidosis. TRH consulted for admission due to suspected HFpEF exacerbation with AECOPD. Medications given: 60 mg Lasix Initial Vitals: 97.6, 22, 102, 138/64 and 96% on 60% FiO2 BiPAP Significant labs: (Labs/ Imaging personally reviewed) I, Cheryll Cockayne Rust-Chester, AGACNP-BC, personally viewed and interpreted this ECG. EKG Interpretation: Date: 12/19/2022 EKG Time: 21:36, Rate: 104, Rhythm: ST, QRS Axis: RAD Intervals: RBBB and LPFB, ST/T Wave abnormalities: Diffuse T  wave inversions, Narrative Interpretation: ST with RBBB and LPFB with diffuse T wave inversions  Chemistry: Na+: 137, K+: 5.3, BUN/Cr.:  53/1.26, Serum CO2/ AG: 32/8 Hematology: WBC: 6, Hgb: 18.4, plt: 145 Troponin: 20 > 19, BNP: 478.4, PCT: Pending, COVID-19 & Influenza A/B: Pending  ABG: 7.16/91/77/32.4 >> 7.16/101/68/36  CXR 12/19/2022: Cardiac enlargement with pulmonary vascular congestion, perihilar edema and probable effusions  Patient's respiratory acidosis did not improve after 4 hours on BiPAP with appropriate tidal volumes. PCCM consulted for assistance in management and monitoring due to acute on chronic hypoxic and hypercarbic respiratory failure requiring emergent intubation and mechanical ventilatory support.  Pertinent  Medical History  HFpEF COPD 5 L chronic oxygen HTN Polycythemia T2dm  Significant Hospital Events: Including procedures, antibiotic start and stop dates in addition to other pertinent events   12/19/22: Admitted by [facility] with HFpEF exacerbation and AECOPD.  Overnight respiratory acidosis did not improve on BiPAP support and PCCM urgently consulted for emergent intubation and mechanical ventilatory support due to acute on chronic hypoxic and hypercarbic respiratory failure. 12/21/22: Patient is on 80% PRVC, +foley with lasix non oliguric , sedation with        fentanyl and propofol, off levophed now.   12/22/22: Patient had acute event overnight with severe resp distress        agitation attempt to self extubate.  He is on 100% and in shock requiring        levophed support 12/23/22: Pt severe hypoxia overnight requiring recruitment maneuver's and bagging with peep valve.  Current vent settings: PEEP 14/FiO2 100%. 12/24/22: Pt s/p proning for 18 hrs now in supine position oxygenation slowly improving vent settings: PEEP  12/FiO2 100%.  Will keep in supine position for 6 hrs and than prone for 18hrs today  12/25/22: Proned again overnight as ABG did not improve.  Discussed with [name], deemed NOT a candidate for ECMO. Cardiology consulted for frequent PVC's with ventricular bigeminy. 12/26/22: Remains critically ill on high vent support (100% FiO2/16 PEEP). Remains on Levophed, tracheal aspirate resulted with Staph Epidermidis. Worsening Hypernatremia. Code status changed to DNR, Palliative Care consulted for further GOC conversations.   Micro Data:   COVID 06/14>>negative  MRSA PCR 06/14>>negative  Blood x2 06/16>>negative  Resp (~20 pathogens) 06/16>>negative  Tracheal aspirate>>STAPHYLOCOCCUS EPIDERMIDIS   Anti-infectives (From admission, onward)    Start     Dose/Rate Route Frequency Ordered Stop   12/25/22 1100  amoxicillin-clavulanate (AUGMENTIN) 875-125 MG per tablet 1 tablet        1 tablet Per Tube Every 12 hours 12/25/22 1005     12/21/22 1130  amoxicillin-clavulanate (AUGMENTIN) 875-125 MG per tablet 1 tablet        1 tablet Per Tube Every 12 hours 12/21/22 1031 12/24/22 2241   12/21/22 1130  azithromycin (ZITHROMAX) tablet 500 mg        500 mg Per Tube Daily 12/21/22 1031 12/22/22 0823   12/21/22 1025  cefTRIAXone (ROCEPHIN) 2 g in sodium chloride 0.9 % 100 mL IVPB  Status:  Discontinued        2 g 200 mL/hr over 30 Minutes Intravenous Every 24 hours 12/21/22 1026 12/21/22 1031   12/20/22 1100  azithromycin (ZITHROMAX) 500 mg in sodium chloride 0.9 % 250 mL IVPB  Status:  Discontinued        500 mg 250 mL/hr over 60 Minutes Intravenous Every 24 hours 12/20/22 0920 12/21/22 1031   12/20/22 0900  azithromycin (ZITHROMAX) 500 mg in sodium chloride 0.9 % 250 mL IVPB  Status:  Discontinued        500 mg 250 mL/hr over 60 Minutes Intravenous Every 24 hours 12/20/22 0743 12/20/22 0920   12/20/22 0900  cefTRIAXone (ROCEPHIN) 1 g in sodium chloride 0.9 % 100 mL IVPB  Status:  Discontinued        1 g 200 mL/hr over 30 Minutes Intravenous Every 24 hours 12/20/22 0743 12/21/22 1026      Interim History / Subjective:  -No significant  events noted overnight -REMAINS CRITICALLY ILL -Proned for 2 days with no improvement in ABG.  Remains hypoxic with high vent requirements (100% FiO2 and 18 PEEP) with intermittent episodes of hypoxia to mid to lower 80's -T Max of 100.6 overnight, leukocytosis resolved ~ Tracheal aspirate with Staph Epidermidis ~ will continue with Augmentin for 10 day total course of ABX -Na+ worsened to 153 from 148 despite increasing free water flushes yesterday to 350 cc q4h ~ will increase D5W to 125 ml/hr and decrease Lasix dosing to 60 mg daily (CVP this am 13-14) -Creatinine stable, UOP 5.3 L last 24 hrs (net -4.3L ) -Bedside US performed by Dr. Isaiah Serge ~ no pleural effusion on the left, only small pleural effusion on the right which is too small for thoracentesis (see images below)      Objective   Blood pressure 124/61, pulse (!) 47, temperature (!) 100.4 F (38 C), resp. rate (!) 31, height 5\' 8"  (1.727 m), weight (!) 142.8 kg, SpO2 90 %.    Vent Mode: PRVC FiO2 (%):  [100 %] 100 % Set Rate:  [10 bmp-31 bmp] 31 bmp Vt Set:  [410 mL-480 mL] 480 mL PEEP:  [  15 cmH20-16 cmH20] 16 cmH20 Plateau Pressure:  [21 cmH20-28 cmH20] 27 cmH20   Intake/Output Summary (Last 24 hours) at 12/26/2022 0853 Last data filed at 12/26/2022 0813 Gross per 24 hour  Intake 7137.16 ml  Output 5725 ml  Net 1412.16 ml    Filed Weights   12/24/22 0409 12/25/22 0500 12/26/22 0444  Weight: (!) 151.1 kg (!) 148.1 kg (!) 142.8 kg    Examination: Gen:      Critically on chronically ill appearing obese male, laying in bed, intubated and sedated, in no acute distress HEENT:  EOMI, sclera anicteric Neck:     No masses; no thyromegaly, difficult to assess JVD due to body habitus, orally intubated Lungs:    Coarse to auscultation bilaterally, even, synchronous with the vent CV:         Regular rate and rhythm; no murmurs Abd:      + bowel sounds; soft, non-tender; no palpable masses, no distension Ext:    2+ peripheral  edema; adequate peripheral perfusion Skin:      Warm and dry; no rash Neuro: Heavily Sedated (requiring higher RASS goal due to severe hypoxia), does not follow commands or withdraw from pain, pupils PERRL   Resolved Hospital Problem list     Assessment & Plan:   #Acute on chronic hypoxic /hypercapnic respiratory failure multifocal in the setting of suspected pneumonia, HFpEF exacerbation/ARDS, bilateral pleural effusions, & AECOPD CTA Chest 12/22/22: negative for PE concerning for moderate bilateral pleural effusion  He has been prone for 2 days with no change in his ABG.  I do not believe he has any recruitable lung and we will not prone anymore ~ DEEMED NOT A ECMO CANDIDATE by [name] -Full vent support, implement lung protective strategies/ARDS protocol -Plateau pressures less than 30 cm H20 -Wean FiO2 & PEEP as tolerated to maintain O2 sats >88% -Follow intermittent Chest X-ray & ABG as needed -Spontaneous Breathing Trials when respiratory parameters met and mental status permits -Implement VAP Bundle -Bronchodilators & Pulmicort nebs -IV steroids -ABX as above -Diuresis as BP and renal function permits ~ decreasing to 60 mg IV Lasix daily on 6/20 -Bedside US 6/20 only showed small right sided pleural effusion which was too small for thoracentesis  #Acute on chronic HFpEF exacerbation #Shock: Septic +/- Cardiogenic +/- decrease preload from high PEEP requirements +/- Sedation related #Mildly Elevated Troponin, suspect due to demand ischemia #Frequent PVC's with ventricular Bigeminy, suspect due to hypoxia #Hyperlipidemia Echo 12/21/22: EF 60 to 65%, grade I diastolic dysfunction, aortic valve sclerosis present, mildly elevated pulmonary artery systolic pressure   -Continuous cardiac monitoring -Maintain MAP >65 -Vasopressors as needed to maintain MAP goal -HS Troponin peaked at 33 -Diuresis as BP and renal function permits ~ Lasix decreased to 60 mg daily on 6/20 ~ follow  CVP to assist with further titration of diuretics -Cardiology following, appreciate input -Maintain goal potassium >4.0 and Magnesium 2.0 -Continue outpatient atorvastatin and aspirin  -Check Cortisol  #STAPHYLOCOCCUS EPIDERMIDIS Pneumonia -Monitor fever curve -Trend WBC's & Procalcitonin -Follow cultures as above -Continue empiric Augmentin pending cultures & sensitivities (plan to complete 10 days of ABX)  #Acute kidney injury ~ STABLE #Hypernatremia ~ WORSENED Baseline Cr: 0.77, Cr on admission: 1.26 -Monitor I&O's / urinary output -Follow BMP -Ensure adequate renal perfusion -Avoid nephrotoxic agents as able -Replace electrolytes as indicated ~ Pharmacy following for assistance with electrolyte replacement -Continue free water flushes, will increase D5W to 125 cc/hr  #Thrombocytopenia  - VTE px: subcutaneous arixtra  -  Trend CBC - Monitor for s/sx of bleeding  - Transfuse for platelet count of <20,000  #T2DM -CBG's q4h; Target range of 140 to 180 -Continue insulin gtt -Follow ICU Hypo/Hyperglycemia protocol  #Sedation needs in setting of Mechanical intubation pain/discomfort  -Maintain a RASS goal of -3 to -4 -Fentanyl and Propofol as needed to maintain RASS goal -Prn Vecuronium for vent dyschrony -Avoid sedating medications as able -Daily wake up assessment    Pt is critically ill with multiorgan failure.  Prognosis is extremely guarded, high risk for further decompensation, cardiac arrest, and death.  Given current critical illness superimposed on multiple chronic co morbidities, overall long term prognosis is poor.  Best Practice (right click and "Reselect all SmartList Selections" daily)  Diet/type: NPO; continue TF's per dietitian recommendations  DVT prophylaxis: subcutaneous arixtra  GI prophylaxis: H2B Lines: central line and arterial lines. Yes and still needed  Foley:  Yes, and it is still needed Code Status:  full code Last date of multidisciplinary  goals of care discussion [12/26/2022]  12/26/22: Updated [name] via telephone regarding pts condition and current plan of care.  All questions were answered and Ms. Bryan Mitchell was appreciative to receive an update  Critical care time:   The patient is critically ill with multiple organ system failure and requires high complexity decision making for assessment and support, frequent evaluation and titration of therapies, advanced monitoring, review of radiographic studies and interpretation of complex data.   Critical Care Time devoted to patient care services, exclusive of separately billable procedures, described in this note is 40 minutes.    [name], AGACNP-BC [facility] Prefer epic messenger for cross cover needs If after hours, please call E-link

## 2022-12-26 NOTE — Progress Notes (Signed)
Progress Note  Patient Name: Bryan Mitchell Date of Encounter: 12/26/2022  Primary Cardiologist: new - consult by End  Subjective   Remains intubated and sedated and on vasopressor support with Levophed and vasopressin. Documented UOP 5.3 L, net 1.4 L for the past 24 hours, net negative 4.5 L for the admission. Has been receiving D5 and free water for hypernatremia up to 153. Continues to have runs of frequent PVCs and ventricular bigeminy. CVP 14.   Inpatient Medications    Scheduled Meds:  amoxicillin-clavulanate  1 tablet Per Tube Q12H   aspirin  81 mg Per Tube Daily   atorvastatin  20 mg Per Tube Daily   budesonide (PULMICORT) nebulizer solution  0.5 mg Nebulization BID   Chlorhexidine Gluconate Cloth  6 each Topical Daily   docusate  100 mg Per Tube BID   fondaparinux (ARIXTRA) injection  2.5 mg Subcutaneous Q24H   free water  350 mL Per Tube Q4H   furosemide  60 mg Intravenous Q12H   ipratropium-albuterol  3 mL Nebulization Q6H   methylPREDNISolone (SOLU-MEDROL) injection  40 mg Intravenous Daily   mouth rinse  15 mL Mouth Rinse Q2H   pantoprazole (PROTONIX) IV  40 mg Intravenous Daily   polyethylene glycol  17 g Per Tube Daily   Continuous Infusions:  sodium chloride 10 mL/hr at 12/26/22 0735   sodium chloride Stopped (12/24/22 1653)   dextrose 100 mL/hr at 12/26/22 0735   feeding supplement (PIVOT 1.5 CAL) 65 mL/hr at 12/26/22 0735   fentaNYL infusion INTRAVENOUS 350 mcg/hr (12/26/22 0735)   insulin 19 Units/hr (12/26/22 0913)   norepinephrine (LEVOPHED) Adult infusion 13 mcg/min (12/26/22 0735)   propofol (DIPRIVAN) infusion 30 mcg/kg/min (12/26/22 0735)   vasopressin Stopped (12/22/22 2017)   PRN Meds: acetaminophen **OR** acetaminophen, albuterol, dextrose, docusate, EPINEPHrine, fentaNYL, lidocaine, magnesium hydroxide, midazolam, ondansetron **OR** ondansetron (ZOFRAN) IV, traZODone, vecuronium   Vital Signs    Vitals:   12/26/22 0608 12/26/22 0700  12/26/22 0747 12/26/22 0750  BP:      Pulse: 98 (!) 47    Resp: (!) 31 (!) 31    Temp: (!) 100.4 F (38 C) (!) 100.4 F (38 C)    TempSrc:      SpO2: 91% 90% (!) 89% 90%  Weight:      Height:        Intake/Output Summary (Last 24 hours) at 12/26/2022 0916 Last data filed at 12/26/2022 0913 Gross per 24 hour  Intake 7137.16 ml  Output 5875 ml  Net 1262.16 ml   Filed Weights   12/24/22 0409 12/25/22 0500 12/26/22 0444  Weight: (!) 151.1 kg (!) 148.1 kg (!) 142.8 kg    Telemetry    SR with frequent PVCs occasionally in a pattern of ventricular bigeminy - Personally Reviewed  ECG    No new tracings - Personally Reviewed  Physical Exam   GEN: No acute distress.   Neck: JVD difficult to assess secondary to body habitus and respiratory support apparatus. Cardiac: Distant, RRR, no murmurs, rubs, or gallops.  Respiratory: Vented breath sounds bilaterally.  GI: Soft, nontender, non-distended.   MS: Mild bilateral pretibial edema; No deformity. Neuro:  Intubated and sedated.  Psych: Intubated and sedated.  Labs    Chemistry Recent Labs  Lab 12/19/22 2142 12/20/22 0557 12/23/22 0416 12/24/22 0255 12/25/22 0504 12/25/22 1225 12/25/22 2202 12/26/22 0412  NA 137   < > 143 146* 154*  --  148* 153*  K 5.3*   < >  3.9 3.7 3.4* 4.0 4.1 3.7  CL 97*   < > 99 103 111  --  105 108  CO2 32   < > 35* 34* 35*  --  33* 37*  GLUCOSE 132*   < > 138* 129* 200*  --  398* 341*  BUN 53*   < > 48* 40* 27*  --  31* 33*  CREATININE 1.26*   < > 1.36* 1.05 0.78  --  0.95 1.00  CALCIUM 9.0   < > 8.0* 7.8* 8.5*  --  8.3* 8.7*  PROT 7.4  --   --   --   --   --   --   --   ALBUMIN 3.8   < > 3.1* 2.8* 3.0*  --   --   --   AST 23  --   --   --   --   --   --   --   ALT 15  --   --   --   --   --   --   --   ALKPHOS 79  --   --   --   --   --   --   --   BILITOT 1.1  --   --   --   --   --   --   --   GFRNONAA >60   < > 60* >60 >60  --  >60 >60  ANIONGAP 8   < > 9 9 8   --  10 8   < > =  values in this interval not displayed.     Hematology Recent Labs  Lab 12/24/22 0255 12/25/22 0504 12/26/22 0412  WBC 6.1 8.0 8.4  RBC 6.26* 6.36* 6.01*  HGB 16.6 16.7 15.8  HCT 57.1* 60.8* 57.5*  MCV 91.2 95.6 95.7  MCH 26.5 26.3 26.3  MCHC 29.1* 27.5* 27.5*  RDW 18.6* 18.9* 18.9*  PLT 75* 77* 105*    Cardiac EnzymesNo results for input(s): "TROPONINI" in the last 168 hours. No results for input(s): "TROPIPOC" in the last 168 hours.   BNP Recent Labs  Lab 12/19/22 2142  BNP 478.4*     DDimer No results for input(s): "DDIMER" in the last 168 hours.   Radiology    DG Chest Port 1 View  Result Date: 12/25/2022 IMPRESSION: 1. Endotracheal tube tip is approximately 3.1 cm above the carina. 2. Layering bilateral pleural effusions. Overlying bibasilar opacities could represent atelectasis, aspiration, and/or pneumonia. Electronically Signed   By: Feliberto Harts M.D.   On: 12/25/2022 12:03   DG Chest Port 1 View  Result Date: 12/24/2022 IMPRESSION: Cardiomegaly. Central pulmonary vessels are prominent. Haziness in right lung may be partly due to layering pleural effusion and underlying atelectasis/pneumonia. There is increased density in left lower lung field suggesting atelectasis and pleural effusion with some interval improvement. Electronically Signed   By: Ernie Avena M.D.   On: 12/24/2022 10:13    Cardiac Studies   2D echo 12/21/2022: 1. Left ventricular ejection fraction, by estimation, is 60 to 65%. The  left ventricle has normal function. The left ventricle has no regional  wall motion abnormalities. Left ventricular diastolic parameters are  consistent with Grade I diastolic  dysfunction (impaired relaxation).   2. Right ventricular systolic function is normal. The right ventricular  size is mildly enlarged. There is mildly elevated pulmonary artery  systolic pressure. The estimated right ventricular systolic pressure is  37.7 mmHg.   3.  The mitral  valve is normal in structure. No evidence of mitral valve  regurgitation. No evidence of mitral stenosis.   4. The aortic valve has an indeterminant number of cusps. Aortic valve  regurgitation is not visualized. Aortic valve sclerosis is present, with  no evidence of aortic valve stenosis.   5. The inferior vena cava is dilated in size with >50% respiratory  variability, suggesting right atrial pressure of 8 mmHg.   Patient Profile     62 y.o. male with history of HFpEF, COPD, hypertension, polycythemia, obesity, chronic peripheral edema, chronic home oxygen, who is being seen 6/202024 for the evaluation of frequent PVCs and ventricular bigeminy at the request of Leanord Asal, NP.   Assessment & Plan    1. Acute on chronic hypoxic and hypercapnic respiratory failure with multifocal pneumonia and bilateral pleural effusions: -IV diuresis as below  -Ongoing management per CCM  2. Acute on chronic HFpEF with morbid obesity and OSA/OHS: -Remains intubated as above -CVP 14 this morning -Restart IV Lasix 60 mg daily -Monitor UOP, renal function, and electrolytes  -Not a good candidate for SGLT2 inhibitors due due to body habitus and the concern for infection -Can consider MRA once he is off of pressor support  3. Frequent PVCs with ventricular bigeminy: -Continues to have paroxysms of frequent PVCs and at times with ventricular bigeminy with an overall improving burden -Vitals with pseudobradycardia in the setting of ventricular bigeminy  -Suspect that PVCs are driven by severe respiratory failure and hypercapnia/hypoxia, less likely ACS -Replete potassium to goal 4.0 -Magnesium at goal -Echo this admission with preserved LV systolic function  -Add beta blocker as/if BP allows -Monitor on tele  4. Elevated high sensitivity troponin: -Mildly elevated and flat trending, peaking at 33 -Not consistent with ACS -Echo with preserved LVSF and normal wall motion -No indication for heparin  gtt -Outpatient follow up     For questions or updates, please contact [facility] Please consult [email or web address] for contact info under Cardiology/STEMI.    Signed, Eula Listen, PA-C [facility] Pager: [phone] 12/26/2022, 9:16 AM

## 2022-12-26 NOTE — Plan of Care (Signed)
ABG improved after vent changes. Insulin gtt started.   Problem: Education: Goal: Ability to demonstrate management of disease process will improve Outcome: Not Progressing Goal: Ability to verbalize understanding of medication therapies will improve Outcome: Not Progressing Goal: Individualized Educational Video(s) Outcome: Not Progressing   Problem: Activity: Goal: Capacity to carry out activities will improve Outcome: Not Progressing   Problem: Cardiac: Goal: Ability to achieve and maintain adequate cardiopulmonary perfusion will improve Outcome: Not Progressing   Problem: Activity: Goal: Ability to tolerate increased activity will improve Outcome: Not Progressing   Problem: Respiratory: Goal: Ability to maintain a clear airway and adequate ventilation will improve Outcome: Not Progressing   Problem: Role Relationship: Goal: Method of communication will improve Outcome: Not Progressing   Problem: Education: Goal: Knowledge of General Education information will improve Description: Including pain rating scale, medication(s)/side effects and non-pharmacologic comfort measures Outcome: Not Progressing   Problem: Health Behavior/Discharge Planning: Goal: Ability to manage health-related needs will improve Outcome: Not Progressing   Problem: Clinical Measurements: Goal: Ability to maintain clinical measurements within normal limits will improve Outcome: Not Progressing Goal: Will remain free from infection Outcome: Not Progressing Goal: Diagnostic test results will improve Outcome: Not Progressing Goal: Respiratory complications will improve Outcome: Not Progressing Goal: Cardiovascular complication will be avoided Outcome: Not Progressing   Problem: Activity: Goal: Risk for activity intolerance will decrease Outcome: Not Progressing   Problem: Nutrition: Goal: Adequate nutrition will be maintained Outcome: Not Progressing   Problem: Coping: Goal: Level of  anxiety will decrease Outcome: Not Progressing   Problem: Elimination: Goal: Will not experience complications related to bowel motility Outcome: Not Progressing Goal: Will not experience complications related to urinary retention Outcome: Not Progressing   Problem: Pain Managment: Goal: General experience of comfort will improve Outcome: Not Progressing   Problem: Safety: Goal: Ability to remain free from injury will improve Outcome: Not Progressing   Problem: Skin Integrity: Goal: Risk for impaired skin integrity will decrease Outcome: Not Progressing   Problem: Education: Goal: Ability to describe self-care measures that may prevent or decrease complications (Diabetes Survival Skills Education) will improve Outcome: Not Progressing Goal: Individualized Educational Video(s) Outcome: Not Progressing   Problem: Coping: Goal: Ability to adjust to condition or change in health will improve Outcome: Not Progressing   Problem: Fluid Volume: Goal: Ability to maintain a balanced intake and output will improve Outcome: Not Progressing   Problem: Health Behavior/Discharge Planning: Goal: Ability to identify and utilize available resources and services will improve Outcome: Not Progressing Goal: Ability to manage health-related needs will improve Outcome: Not Progressing   Problem: Metabolic: Goal: Ability to maintain appropriate glucose levels will improve Outcome: Not Progressing   Problem: Nutritional: Goal: Maintenance of adequate nutrition will improve Outcome: Not Progressing Goal: Progress toward achieving an optimal weight will improve Outcome: Not Progressing   Problem: Skin Integrity: Goal: Risk for impaired skin integrity will decrease Outcome: Not Progressing   Problem: Tissue Perfusion: Goal: Adequacy of tissue perfusion will improve Outcome: Not Progressing

## 2022-12-26 NOTE — Progress Notes (Signed)
PHARMACY CONSULT NOTE  Pharmacy Consult for Electrolyte Monitoring and Replacement   Recent Labs: Potassium (mmol/L)  Date Value  12/26/2022 3.7  04/21/2012 4.8   Magnesium (mg/dL)  Date Value  16/04/9603 2.5 (H)   Calcium (mg/dL)  Date Value  54/03/8118 8.7 (L)   Calcium, Total (mg/dL)  Date Value  14/78/2956 8.8   Albumin (g/dL)  Date Value  21/30/8657 3.0 (L)  10/02/2022 4.2  04/21/2012 4.0   Phosphorus (mg/dL)  Date Value  84/69/6295 2.9   Sodium (mmol/L)  Date Value  12/26/2022 153 (H)  10/02/2022 140  04/21/2012 139     Assessment: 62 y.o. male w/ PMH of morbid obesity, alpha gal allergy, COPD, CHF, diabetes, polycythemia from chronic hypoxia, who presents with COPDe and CHFe. Pharmacy is asked to follow and replace electrolytes while in CCU  Diuretics: IV Lasix 60 mg q12h Diet/Fluids: FWF 350 ml q4h  Goal of Therapy:  Electrolytes within normal limits  Plan:  --Hypernatremia: continue D5@100ml /hr, FWF@350ml  q4h --No replacement currently indicated --Re-check electrolytes in AM  Bryan Mitchell Bryan Mitchell 12/26/2022 7:36 AM

## 2022-12-26 NOTE — Progress Notes (Signed)
eLink Physician-Brief Progress Note Patient Name: Bryan Mitchell DOB: 01-25-1961 MRN: [id number]   Date of Service  12/26/2022  HPI/Events of Note  Patient with intractable hyperglycemia despite aggressive attempt at management wth SQ Insulin.  eICU Interventions  Insulin gtt ordered.        Thomasene Lot Winona Sison 12/26/2022, 3:29 AM

## 2022-12-26 NOTE — Progress Notes (Signed)
     Referral received for Bryan Mitchell re: goals of care discussion. Chart reviewed. Patient is ventilated/sedated and is unable to engage in GOC discussions independently at this time.   I was able to speak with patient's sister/HCPOA Bryan Mitchell over the phone. GOC meeting scheduled for 2pm via telephone tomorrow, 6/21.  Detailed note and recommendations to follow once GOC has been completed.   Thank you for your referral and allowing PMT to assist in Mr. Bryan Mitchell care.   Georgiann Cocker, FNP-BC Palliative Medicine Team  Phone: [phone]  NO CHARGE

## 2022-12-26 NOTE — IPAL (Signed)
  Interdisciplinary Goals of Care Family Meeting   Date carried out: 12/26/2022  Location of the meeting: Phone conference  Member's involved: Nurse Practitioner and Family Member or next of kin  Durable Power of Attorney or acting medical decision maker: Chalmers Cater (pt's sister and POA)  Discussion: We discussed goals of care for [employer] .  We discussed that pt is critically ill with multiorgan failure on vent due to severe respiratory failure from pneumonia/HFpEF/ARDS/AECOPD requiring max vent support (100% FiO2/18 PEEP) and proning, along with shock and AKI.  He was deemed not a candidate for ECMO, and there is nothing else to escalate to for treating his lungs.  His respiratory failure is causing cardiac arrhthymias, and that he is at high risk for further decompensation, cardiac arrest, and death.  We discussed that given his critical illness requiring maximum vent support, that if he were to cardiac arrest, performing CPR and ACLS would provide little benefit and would unlikely change his overall outcome.  She is in agreement.  She requests that we continue to treat the treatable aggressively with current measures and allow time for outcomes, however if he were to cardiac arrest, she requests that we do NOT do CPR or ACLS. Code status changed to DNR.  Code status:   Code Status: DNR   Disposition: Continue current acute care  Time spent for the meeting: 15 minutes   [name], AGACNP-BC [facility] Prefer epic messenger for cross cover needs If after hours, please call E-link  Judithe Modest, NP  12/26/2022, 10:40 AM

## 2022-12-27 ENCOUNTER — Inpatient Hospital Stay: Payer: Medicaid Other

## 2022-12-27 DIAGNOSIS — E870 Hyperosmolality and hypernatremia: Secondary | ICD-10-CM | POA: Diagnosis not present

## 2022-12-27 DIAGNOSIS — Z515 Encounter for palliative care: Secondary | ICD-10-CM

## 2022-12-27 DIAGNOSIS — I509 Heart failure, unspecified: Secondary | ICD-10-CM

## 2022-12-27 DIAGNOSIS — J9621 Acute and chronic respiratory failure with hypoxia: Secondary | ICD-10-CM | POA: Diagnosis not present

## 2022-12-27 DIAGNOSIS — I5033 Acute on chronic diastolic (congestive) heart failure: Secondary | ICD-10-CM | POA: Diagnosis not present

## 2022-12-27 LAB — BLOOD GAS, ARTERIAL
Acid-Base Excess: 12.5 mmol/L — ABNORMAL HIGH (ref 0.0–2.0)
Bicarbonate: 40.8 mmol/L — ABNORMAL HIGH (ref 20.0–28.0)
FIO2: 90 %
MECHVT: 480 mL
Mechanical Rate: 31
O2 Saturation: 97.9 %
PEEP: 18 cmH2O
Patient temperature: 37
pCO2 arterial: 69 mmHg (ref 32–48)
pH, Arterial: 7.38 (ref 7.35–7.45)
pO2, Arterial: 90 mmHg (ref 83–108)

## 2022-12-27 LAB — CULTURE, BLOOD (ROUTINE X 2)

## 2022-12-27 LAB — RENAL FUNCTION PANEL
Albumin: 2.7 g/dL — ABNORMAL LOW (ref 3.5–5.0)
Anion gap: 7 (ref 5–15)
BUN: 34 mg/dL — ABNORMAL HIGH (ref 6–20)
CO2: 34 mmol/L — ABNORMAL HIGH (ref 22–32)
Calcium: 8 mg/dL — ABNORMAL LOW (ref 8.9–10.3)
Chloride: 105 mmol/L (ref 98–111)
Creatinine, Ser: 0.85 mg/dL (ref 0.61–1.24)
GFR, Estimated: >60 mL/min (ref >60)
Glucose, Bld: 135 mg/dL — ABNORMAL HIGH (ref 70–99)
Phosphorus: 2.9 mg/dL (ref 2.5–4.6)
Potassium: 3.6 mmol/L (ref 3.5–5.1)
Sodium: 146 mmol/L — ABNORMAL HIGH (ref 135–145)

## 2022-12-27 LAB — GLUCOSE, CAPILLARY
Glucose-Capillary: 145 mg/dL — ABNORMAL HIGH (ref 70–99)
Glucose-Capillary: 150 mg/dL — ABNORMAL HIGH (ref 70–99)
Glucose-Capillary: 256 mg/dL — ABNORMAL HIGH (ref 70–99)
Glucose-Capillary: 355 mg/dL — ABNORMAL HIGH (ref 70–99)
Glucose-Capillary: 375 mg/dL — ABNORMAL HIGH (ref 70–99)

## 2022-12-27 LAB — CBC
HCT: 54.9 % — ABNORMAL HIGH (ref 39.0–52.0)
Hemoglobin: 15.2 g/dL (ref 13.0–17.0)
MCH: 26.3 pg (ref 26.0–34.0)
MCHC: 27.7 g/dL — ABNORMAL LOW (ref 30.0–36.0)
MCV: 95.1 fL (ref 80.0–100.0)
Platelets: 148 10*3/uL — ABNORMAL LOW (ref 150–400)
RBC: 5.77 MIL/uL (ref 4.22–5.81)
RDW: 18.6 % — ABNORMAL HIGH (ref 11.5–15.5)
WBC: 9.6 10*3/uL (ref 4.0–10.5)
nRBC: 0 % (ref 0.0–0.2)

## 2022-12-27 LAB — BRAIN NATRIURETIC PEPTIDE: B Natriuretic Peptide: 405.7 pg/mL — ABNORMAL HIGH (ref 0.0–100.0)

## 2022-12-27 MED ORDER — INSULIN ASPART 100 UNIT/ML IJ SOLN
0.0000 [IU] | INTRAMUSCULAR | Status: DC
  Administered 2022-12-27: 1 [IU] via SUBCUTANEOUS
  Administered 2022-12-27: 5 [IU] via SUBCUTANEOUS
  Administered 2022-12-27: 9 [IU] via SUBCUTANEOUS
  Filled 2022-12-27 (×2): qty 1

## 2022-12-27 MED ORDER — INSULIN ASPART 100 UNIT/ML IJ SOLN: 2.0000 [IU] | INTRAMUSCULAR | Status: DC

## 2022-12-27 MED ORDER — POTASSIUM CHLORIDE 10 MEQ/100ML IV SOLN
10.0000 meq | INTRAVENOUS | Status: AC
  Administered 2022-12-27 (×3): 10 meq via INTRAVENOUS
  Filled 2022-12-27 (×3): qty 100

## 2022-12-27 MED ORDER — AMIODARONE IV BOLUS ONLY 150 MG/100ML
INTRAVENOUS | Status: AC
  Filled 2022-12-27: qty 100

## 2022-12-27 MED ORDER — INSULIN GLARGINE-YFGN 100 UNIT/ML ~~LOC~~ SOLN
10.0000 [IU] | Freq: Every day | SUBCUTANEOUS | Status: DC
  Administered 2022-12-27: 10 [IU] via SUBCUTANEOUS
  Filled 2022-12-27: qty 0.1

## 2022-12-27 MED ORDER — INSULIN ASPART 100 UNIT/ML IJ SOLN
0.0000 [IU] | INTRAMUSCULAR | Status: DC
  Administered 2022-12-27: 6 [IU] via SUBCUTANEOUS
  Administered 2022-12-28: 15 [IU] via SUBCUTANEOUS
  Administered 2022-12-28: 4 [IU] via SUBCUTANEOUS
  Administered 2022-12-28: 11 [IU] via SUBCUTANEOUS
  Filled 2022-12-27 (×4): qty 1

## 2022-12-27 MED ORDER — AMIODARONE HCL IN DEXTROSE 360-4.14 MG/200ML-% IV SOLN
INTRAVENOUS | Status: AC
  Filled 2022-12-27: qty 200

## 2022-12-27 NOTE — Inpatient Diabetes Management (Signed)
Inpatient Diabetes Program Recommendations  AACE/ADA: New Consensus Statement on Inpatient Glycemic Control   Target Ranges:  Prepandial:   less than 140 mg/dL      Peak postprandial:   less than 180 mg/dL (1-2 hours)      Critically ill patients:  140 - 180 mg/dL    Latest Reference Range & Units 12/26/22 17:12 12/26/22 18:05 12/26/22 19:25 12/26/22 20:34 12/26/22 21:44 12/26/22 22:34 12/26/22 23:49 12/27/22 07:31  Glucose-Capillary 70 - 99 mg/dL 409 (H) 811 (H) 914 (H) 240 (H) 198 (H) 197 (H) 154 (H) 145 (H)   Review of Glycemic Control  Diabetes history: DM2 Outpatient Diabetes medications: Ozempic 1 mg Qweek, Metformin 850 mg BID Current orders for Inpatient glycemic control: None; Solumedrol 40 mg daily, Pivot @ 65 ml/hr  Inpatient Diabetes Program Recommendations:    Insulin: Per chart, IV insulin was stopped around 5:27 am today. No basal or Novolog correction given or ordered since IV insulin stopped. Please consider using Phase 1 ICU Glycemic Control order set to order CBGs Q4H, Novolog 3-9 units Q4H, and Novolog 5 units Q4H for TF coverage. If Solumedrol is continued as ordered, may also need to order basal insulin.  Thanks, Orlando Penner, RN, MSN, CDCES Diabetes Coordinator Inpatient Diabetes Program [phone] (Team Pager from 8am to 5pm)

## 2022-12-27 NOTE — Progress Notes (Signed)
eLink Physician-Brief Progress Note Patient Name: Bryan Mitchell DOB: 1961-04-17 MRN: [id number]   Date of Service  12/27/2022  HPI/Events of Note  D 5 % gtt discontinued and hyperglycemia Insulin transition orders entered.  eICU Interventions  See above.        Thomasene Lot Jovanie Verge 12/27/2022, 5:09 AM

## 2022-12-27 NOTE — Progress Notes (Signed)
Progress Note  Patient Name: Bryan Mitchell Date of Encounter: 12/27/2022  Primary Cardiologist: new - consult by End  Subjective   Remains intubated and sedated and on vasopressor support with Levophed. Documented UOP 5.3 L, net +1.6 L for the past 24 hours, net negative 2.6 L for the admission. Has been receiving D5 and free water for hypernatremia, improving from 153 to 146. CVP 11 this AM.   Inpatient Medications    Scheduled Meds:  aspirin  81 mg Per Tube Daily   atorvastatin  20 mg Per Tube Daily   budesonide (PULMICORT) nebulizer solution  0.5 mg Nebulization BID   Chlorhexidine Gluconate Cloth  6 each Topical Daily   docusate  100 mg Per Tube BID   fondaparinux (ARIXTRA) injection  2.5 mg Subcutaneous Q24H   free water  200 mL Per Tube Q2H   furosemide  60 mg Intravenous Daily   ipratropium-albuterol  3 mL Nebulization Q6H   linezolid  600 mg Per Tube Q12H   methylPREDNISolone (SOLU-MEDROL) injection  40 mg Intravenous Daily   mouth rinse  15 mL Mouth Rinse Q2H   pantoprazole (PROTONIX) IV  40 mg Intravenous Daily   polyethylene glycol  17 g Per Tube Daily   Continuous Infusions:  sodium chloride Stopped (12/26/22 1715)   sodium chloride Stopped (12/24/22 1653)   feeding supplement (PIVOT 1.5 CAL) 65 mL/hr at 12/27/22 0600   fentaNYL infusion INTRAVENOUS 300 mcg/hr (12/27/22 0600)   norepinephrine (LEVOPHED) Adult infusion 4 mcg/min (12/27/22 0600)   propofol (DIPRIVAN) infusion 20 mcg/kg/min (12/27/22 0600)   PRN Meds: acetaminophen **OR** acetaminophen, albuterol, docusate, fentaNYL, lidocaine, magnesium hydroxide, midazolam, ondansetron **OR** ondansetron (ZOFRAN) IV, traZODone, vecuronium   Vital Signs    Vitals:   12/27/22 0708 12/27/22 0711 12/27/22 0715 12/27/22 0730  BP:      Pulse:   81 82  Resp:   (!) 31 (!) 31  Temp:   99.5 F (37.5 C) 99.5 F (37.5 C)  TempSrc:      SpO2: 95% 95% 95% 96%  Weight:      Height:        Intake/Output Summary  (Last 24 hours) at 12/27/2022 0753 Last data filed at 12/27/2022 0630 Gross per 24 hour  Intake 6112.49 ml  Output 4585 ml  Net 1527.49 ml    Filed Weights   12/25/22 0500 12/26/22 0444 12/27/22 0450  Weight: (!) 148.1 kg (!) 142.8 kg (!) 137.5 kg    Telemetry    SR with frequent PVCs occasionally in a pattern of ventricular trigeminy - Personally Reviewed  ECG    No new tracings - Personally Reviewed  Physical Exam   GEN: No acute distress.   Neck: JVD difficult to assess secondary to body habitus and respiratory support apparatus and facial hair. Cardiac: Distant, RRR, no murmurs, rubs, or gallops.  Respiratory: Vented breath sounds bilaterally.  GI: Soft, nontender, non-distended.   MS: Mild bilateral pretibial edema; No deformity. Neuro:  Intubated and sedated.  Psych: Intubated and sedated.  Labs    Chemistry Recent Labs  Lab 12/24/22 0255 12/25/22 0504 12/25/22 1225 12/25/22 2202 12/26/22 0412 12/27/22 0345  NA 146* 154*  --  148* 153* 146*  K 3.7 3.4*   < > 4.1 3.7 3.6  CL 103 111  --  105 108 105  CO2 34* 35*  --  33* 37* 34*  GLUCOSE 129* 200*  --  398* 341* 135*  BUN 40* 27*  --  31*  33* 34*  CREATININE 1.05 0.78  --  0.95 1.00 0.85  CALCIUM 7.8* 8.5*  --  8.3* 8.7* 8.0*  ALBUMIN 2.8* 3.0*  --   --   --  2.7*  GFRNONAA >60 >60  --  >60 >60 >60  ANIONGAP 9 8  --  10 8 7    < > = values in this interval not displayed.      Hematology Recent Labs  Lab 12/25/22 0504 12/26/22 0412 12/27/22 0345  WBC 8.0 8.4 9.6  RBC 6.36* 6.01* 5.77  HGB 16.7 15.8 15.2  HCT 60.8* 57.5* 54.9*  MCV 95.6 95.7 95.1  MCH 26.3 26.3 26.3  MCHC 27.5* 27.5* 27.7*  RDW 18.9* 18.9* 18.6*  PLT 77* 105* 148*     Cardiac EnzymesNo results for input(s): "TROPONINI" in the last 168 hours. No results for input(s): "TROPIPOC" in the last 168 hours.   BNP Recent Labs  Lab 12/27/22 0345  BNP 405.7*      DDimer No results for input(s): "DDIMER" in the last 168 hours.    Radiology    DG Chest Port 1 View  Result Date: 12/25/2022 IMPRESSION: 1. Endotracheal tube tip is approximately 3.1 cm above the carina. 2. Layering bilateral pleural effusions. Overlying bibasilar opacities could represent atelectasis, aspiration, and/or pneumonia. Electronically Signed   By: Feliberto Harts M.D.   On: 12/25/2022 12:03   DG Chest Port 1 View  Result Date: 12/24/2022 IMPRESSION: Cardiomegaly. Central pulmonary vessels are prominent. Haziness in right lung may be partly due to layering pleural effusion and underlying atelectasis/pneumonia. There is increased density in left lower lung field suggesting atelectasis and pleural effusion with some interval improvement. Electronically Signed   By: Ernie Avena M.D.   On: 12/24/2022 10:13    Cardiac Studies   2D echo 12/21/2022: 1. Left ventricular ejection fraction, by estimation, is 60 to 65%. The  left ventricle has normal function. The left ventricle has no regional  wall motion abnormalities. Left ventricular diastolic parameters are  consistent with Grade I diastolic  dysfunction (impaired relaxation).   2. Right ventricular systolic function is normal. The right ventricular  size is mildly enlarged. There is mildly elevated pulmonary artery  systolic pressure. The estimated right ventricular systolic pressure is  37.7 mmHg.   3. The mitral valve is normal in structure. No evidence of mitral valve  regurgitation. No evidence of mitral stenosis.   4. The aortic valve has an indeterminant number of cusps. Aortic valve  regurgitation is not visualized. Aortic valve sclerosis is present, with  no evidence of aortic valve stenosis.   5. The inferior vena cava is dilated in size with >50% respiratory  variability, suggesting right atrial pressure of 8 mmHg.   Patient Profile     62 y.o. male with history of HFpEF, COPD, hypertension, polycythemia, obesity, chronic peripheral edema, chronic home oxygen, who is  being seen 6/202024 for the evaluation of frequent PVCs and ventricular bigeminy at the request of Leanord Asal, NP.   Assessment & Plan    1. Acute on chronic hypoxic and hypercapnic respiratory failure with multifocal pneumonia and bilateral pleural effusions: -IV diuresis as below  -Ongoing management per CCM  2. Acute on chronic HFpEF with morbid obesity and OSA/OHS: -Remains intubated as above -CVP 11 this morning -BNP 405 -Net + for the past 24 hours with needed free water and D5 for hypernatremia  -Continue IV Lasix 60 mg daily, may need to escalate given free water and D5  need  -Monitor UOP, renal function, and electrolytes  -Not a good candidate for SGLT2 inhibitors due due to body habitus and the concern for infection -Can consider MRA once he is off of pressor support  3. Frequent PVCs with ventricular bigeminy: -Continues to have paroxysms of frequent PVCs and at times with ventricular bigeminy/trigeminy with an overall improving burden -Vitals with pseudobradycardia in the setting of ventricular bigeminy  -Suspect that PVCs are driven by severe respiratory failure and hypercapnia/hypoxia, less likely ACS -Replete potassium to goal 4.0 per ICU protocol  -Magnesium at goal -Echo this admission with preserved LV systolic function  -Add beta blocker as/if BP allows -Monitor on tele  4. Elevated high sensitivity troponin: -Mildly elevated and flat trending, peaking at 33 -Not consistent with ACS -Echo with preserved LVSF and normal wall motion -No indication for heparin gtt -Outpatient follow up     For questions or updates, please contact [facility] Please consult [email or web address] for contact info under Cardiology/STEMI.    Signed, Eula Listen, PA-C [facility] Pager: [phone] 12/27/2022, 7:53 AM

## 2022-12-27 NOTE — Progress Notes (Addendum)
1600 Patient has remained ventilated and sedated all shift. His pupils are equal at 5 mm with right pupil slightly reactive and left pupil non reactive. Blood pressure is labile even while receiving Levophed (100 mg in 250 ml) at a rate of 5 mcg/min. He is currently receiving Fentanyl and Propofol for sedation. Ventilator remains on high FIO2 at 80-85%, PeeP is high at 18, respiratory rate of 31. Patient does not attempt to communicate or move spontaneously. At home he is reported to be noncompliant with his home oxygen,home diet or medications. He lives alone. Patient's sister called this morning for an update.No other family has called or visited. Patient received potassium replacement this morning. Nutrition per oral gastric tube-Pivot 1.5 at rate of 65 ml/hour with 200 ml of sterile water flushes every 2 hours. Patient has been afebrile all shift. Patient has +2 edema in bilateral upper and lower extremities. His abdomen is very large.

## 2022-12-27 NOTE — Progress Notes (Signed)
NAME:  Nizar Cutler, MRN:  [id number], DOB:  Mar 28, 1961, LOS: 8 ADMISSION DATE:  2022/12/23, CONSULTATION DATE:  12/20/22 REFERRING MD:  Cliffton Asters, NP, CHIEF COMPLAINT:  shortness of breath   History of Present Illness:  62 year old male presenting to [facility] ED from home via EMS on 12-23-22 for evaluation of shortness of breath.  History provided per chart review as patient is in respiratory distress and unable to participate in interview at this time. Patient reported acute onset of worsening dyspnea over the last week with associated orthopnea and paroxysmal nocturnal dyspnea, worsening lower extremity edema and dyspnea on exertion.  He admitted to not taking his Lasix on a regular basis.  He denied fever/chills, but did endorse occasional cough.  Prior to EMS arrival the patient was administered 80 mg of Lasix.  Upon EMS arrival the patient was noted to be satting 80% on his home O2 of 5 L nasal cannula & tachycardic at 151.  EMS transition the patient to NRB and oxygen improved to 96%.  ED course: Upon arrival patient alert and appropriately following commands, reporting his dyspnea feels better than it did at home.  Patient transition to BiPAP support.  Imaging consistent with pulmonary edema and lab work suggestive of CHF exacerbation with an elevated BNP.  Labs also significant for mild hyperkalemia, mildly elevated troponin, AKI, hypochloremia, as well as mild thrombocytopenia.  Blood gas revealed significant respiratory acidosis. TRH consulted for admission due to suspected HFpEF exacerbation with AECOPD. Medications given: 60 mg Lasix Initial Vitals: 97.6, 22, 102, 138/64 and 96% on 60% FiO2 BiPAP Significant labs: (Labs/ Imaging personally reviewed) I, Cheryll Cockayne Rust-Chester, AGACNP-BC, personally viewed and interpreted this ECG. EKG Interpretation: Date: 23-Dec-2022 EKG Time: 21:36, Rate: 104, Rhythm: ST, QRS Axis: RAD Intervals: RBBB and LPFB, ST/T Wave abnormalities: Diffuse T  wave inversions, Narrative Interpretation: ST with RBBB and LPFB with diffuse T wave inversions  Chemistry: Na+: 137, K+: 5.3, BUN/Cr.:  53/1.26, Serum CO2/ AG: 32/8 Hematology: WBC: 6, Hgb: 18.4, plt: 145 Troponin: 20 > 19, BNP: 478.4, PCT: Pending, COVID-19 & Influenza A/B: Pending  ABG: 7.16/91/77/32.4 >> 7.16/101/68/36  CXR 12-23-2022: Cardiac enlargement with pulmonary vascular congestion, perihilar edema and probable effusions  Patient's respiratory acidosis did not improve after 4 hours on BiPAP with appropriate tidal volumes. PCCM consulted for assistance in management and monitoring due to acute on chronic hypoxic and hypercarbic respiratory failure requiring emergent intubation and mechanical ventilatory support.  Pertinent  Medical History  HFpEF COPD 5 L chronic oxygen HTN Polycythemia T2dm  Significant Hospital Events: Including procedures, antibiotic start and stop dates in addition to other pertinent events   Dec 23, 2022: Admitted by [facility] with HFpEF exacerbation and AECOPD.  Overnight respiratory acidosis did not improve on BiPAP support and PCCM urgently consulted for emergent intubation and mechanical ventilatory support due to acute on chronic hypoxic and hypercarbic respiratory failure. 12/21/22: Patient is on 80% PRVC, +foley with lasix non oliguric , sedation with        fentanyl and propofol, off levophed now.   12/22/22: Patient had acute event overnight with severe resp distress        agitation attempt to self extubate.  He is on 100% and in shock requiring        levophed support 12/23/22: Pt severe hypoxia overnight requiring recruitment maneuver's and bagging with peep valve.  Current vent settings: PEEP 14/FiO2 100%. 12/24/22: Pt s/p proning for 18 hrs now in supine position oxygenation slowly improving vent settings: PEEP  12/FiO2 100%.  Will keep in supine position for 6 hrs and than prone for 18hrs today  12/25/22: Proned again overnight as ABG did not improve.  Discussed with [name], deemed NOT a candidate for ECMO. Cardiology consulted for frequent PVC's with ventricular bigeminy. 12/26/22: Remains critically ill on high vent support (100% FiO2/16 PEEP). Remains on Levophed, tracheal aspirate resulted with Staph Epidermidis. Worsening Hypernatremia. Code status changed to DNR, Palliative Care consulted for further GOC conversations. 12/27/22: very minimal improvement in clinical and respiratory status. Continues to do poorly. Ongoing GoC discussion.    Micro Data:   COVID 06/14>>negative  MRSA PCR 06/14>>negative  Blood x2 06/16>>negative  Resp (~20 pathogens) 06/16>>negative  Tracheal aspirate>>STAPHYLOCOCCUS EPIDERMIDIS   Anti-infectives (From admission, onward)    Start     Dose/Rate Route Frequency Ordered Stop   12/26/22 1800  linezolid (ZYVOX) tablet 600 mg        600 mg Per Tube Every 12 hours 12/26/22 1621     12/25/22 1100  amoxicillin-clavulanate (AUGMENTIN) 875-125 MG per tablet 1 tablet  Status:  Discontinued        1 tablet Per Tube Every 12 hours 12/25/22 1005 12/26/22 1621   12/21/22 1130  amoxicillin-clavulanate (AUGMENTIN) 875-125 MG per tablet 1 tablet        1 tablet Per Tube Every 12 hours 12/21/22 1031 12/24/22 2241   12/21/22 1130  azithromycin (ZITHROMAX) tablet 500 mg        500 mg Per Tube Daily 12/21/22 1031 12/22/22 0823   12/21/22 1025  cefTRIAXone (ROCEPHIN) 2 g in sodium chloride 0.9 % 100 mL IVPB  Status:  Discontinued        2 g 200 mL/hr over 30 Minutes Intravenous Every 24 hours 12/21/22 1026 12/21/22 1031   12/20/22 1100  azithromycin (ZITHROMAX) 500 mg in sodium chloride 0.9 % 250 mL IVPB  Status:  Discontinued        500 mg 250 mL/hr over 60 Minutes Intravenous Every 24 hours 12/20/22 0920 12/21/22 1031   12/20/22 0900  azithromycin (ZITHROMAX) 500 mg in sodium chloride 0.9 % 250 mL IVPB  Status:  Discontinued        500 mg 250 mL/hr over 60 Minutes Intravenous Every 24 hours 12/20/22 0743 12/20/22  0920   12/20/22 0900  cefTRIAXone (ROCEPHIN) 1 g in sodium chloride 0.9 % 100 mL IVPB  Status:  Discontinued        1 g 200 mL/hr over 30 Minutes Intravenous Every 24 hours 12/20/22 0743 12/21/22 1026      Interim History / Subjective:  -No significant events noted overnight -REMAINS CRITICALLY ILL   Objective   Blood pressure 124/61, pulse 82, temperature 99.5 F (37.5 C), resp. rate (!) 31, height 5\' 8"  (1.727 m), weight (!) 137.5 kg, SpO2 96 %. CVP:  [0 mmHg-18 mmHg] 11 mmHg  Vent Mode: PRVC FiO2 (%):  [80 %-100 %] 90 % Set Rate:  [31 bmp] 31 bmp Vt Set:  [480 mL] 480 mL PEEP:  [18 cmH20] 18 cmH20 Plateau Pressure:  [29 cmH20] 29 cmH20   Intake/Output Summary (Last 24 hours) at 12/27/2022 0852 Last data filed at 12/27/2022 0630 Gross per 24 hour  Intake 6112.49 ml  Output 4435 ml  Net 1677.49 ml   Filed Weights   12/25/22 0500 12/26/22 0444 12/27/22 0450  Weight: (!) 148.1 kg (!) 142.8 kg (!) 137.5 kg    Examination: Gen:      Critically on chronically ill appearing obese  male, laying in bed, intubated and sedated, in no acute distress HEENT:  EOMI, sclera anicteric Neck:     No masses; no thyromegaly, difficult to assess JVD due to body habitus, orally intubated Lungs:    Coarse to auscultation bilaterally, even, synchronous with the vent CV:         Regular rate and rhythm; no murmurs Abd:      + bowel sounds; soft, non-tender; no palpable masses, no distension Ext:    2+ peripheral edema; adequate peripheral perfusion Skin:      Warm and dry; no rash Neuro: Heavily Sedated (requiring higher RASS goal due to severe hypoxia), does not follow commands or withdraw from pain, pupils PERRL   Resolved Hospital Problem list     Assessment & Plan:   #Acute on chronic hypoxic /hypercapnic respiratory failure multifocal in the setting of suspected pneumonia, HFpEF exacerbation/ARDS, bilateral pleural effusions, & AECOPD CTA Chest 12/22/22: negative for PE concerning for  moderate bilateral pleural effusion  He has been prone for 2 days with no change in his ABG.  I do not believe he has any recruitable lung and we will not prone anymore ~ DEEMED NOT A ECMO CANDIDATE by [name] -Full vent support, implement lung protective strategies/ARDS protocol -Plateau pressures less than 30 cm H20 -Wean FiO2 & PEEP as tolerated to maintain O2 sats >88% -Follow intermittent Chest X-ray & ABG as needed -Spontaneous Breathing Trials when respiratory parameters met and mental status permits -Implement VAP Bundle -Bronchodilators & Pulmicort nebs -IV steroids -ABX as above -Diuresis as BP and renal function permits ~ decreasing to 60 mg IV Lasix daily on 6/20 -Bedside US 6/20 only showed small right sided pleural effusion which was too small for thoracentesis  #Acute on chronic HFpEF exacerbation #Shock: Septic +/- Cardiogenic +/- decrease preload from high PEEP requirements +/- Sedation related #Mildly Elevated Troponin, suspect due to demand ischemia #Frequent PVC's with ventricular Bigeminy, suspect due to hypoxia #Hyperlipidemia Echo 12/21/22: EF 60 to 65%, grade I diastolic dysfunction, aortic valve sclerosis present, mildly elevated pulmonary artery systolic pressure   -Continuous cardiac monitoring -Maintain MAP >65 -Vasopressors as needed to maintain MAP goal -HS Troponin peaked at 33 -Diuresis as BP and renal function permits ~ Lasix decreased to 60 mg daily on 6/20 ~ follow CVP to assist with further titration of diuretics -Cardiology following, appreciate input -Maintain goal potassium >4.0 and Magnesium 2.0 -Continue outpatient atorvastatin and aspirin   #STAPHYLOCOCCUS EPIDERMIDIS Pneumonia -Monitor fever curve -Trend WBC's & Procalcitonin -Follow cultures as above -Continue empiric Augmentin pending cultures & sensitivities (plan to complete 10 days of ABX)  #Acute kidney injury ~ STABLE #Hypernatremia ~ WORSENED Baseline Cr: 0.77, Cr on  admission: 1.26 -Monitor I&O's / urinary output -Follow BMP -Ensure adequate renal perfusion -Avoid nephrotoxic agents as able -Replace electrolytes as indicated ~ Pharmacy following for assistance with electrolyte replacement -Continue free water flushes, will increase D5W to 125 cc/hr  #Thrombocytopenia  - VTE px: subcutaneous arixtra  - Trend CBC - Monitor for s/sx of bleeding  - Transfuse for platelet count of <20,000  #T2DM -CBG's q4h; Target range of 140 to 180 -Continue insulin gtt -Follow ICU Hypo/Hyperglycemia protocol  #Sedation needs in setting of Mechanical intubation pain/discomfort  -Maintain a RASS goal of -3 to -4 -Fentanyl and Propofol as needed to maintain RASS goal -Prn Vecuronium for vent dyschrony -Avoid sedating medications as able -Daily wake up assessment  Pt is critically ill with multiorgan failure.  Prognosis is extremely  guarded, high risk for further decompensation, cardiac arrest, and death.  Given current critical illness superimposed on multiple chronic co morbidities, overall long term prognosis is poor.  Best Practice (right click and "Reselect all SmartList Selections" daily)  Diet/type: NPO; continue TF's per dietitian recommendations  DVT prophylaxis: subcutaneous arixtra  GI prophylaxis: H2B Lines: central line and arterial lines. Yes and still needed  Foley:  Yes, and it is still needed Code Status:  DNR Last date of multidisciplinary goals of care discussion [12/26/2022]   Critical care time:   The patient is critically ill with multiple organ system failure and requires high complexity decision making for assessment and support, frequent evaluation and titration of therapies, advanced monitoring, review of radiographic studies and interpretation of complex data.   Critical Care Time devoted to patient care services, exclusive of separately billable procedures, described in this note is 45 minutes.   Rhea Bleacher MD Pulmonary & Critical  Care Medicine

## 2022-12-27 NOTE — Progress Notes (Signed)
Pt moved from ICU-2 to ICU-5 while being ambu bagged with peep valve then placed back on vent once settled in new room with same vent settings.

## 2022-12-27 NOTE — Progress Notes (Addendum)
                                                     Palliative Care Progress Note   Patient Name: Bryan Mitchell       Date: 12/27/2022 DOB: [birth year]  Age: 62 y.o. MRN#: [id number] Attending Physician: Earley Brooke, MD Primary Care Physician: Sandre Kitty, MD Admit Date: 12/11/2022  Chart reviewed.  Patient assessed.  Updates received from RN Myra.  Patient unable to participate in goals of care discussions given that he is ventilated/sedated.  I scheduled a goals of care discussion with patient's Sister Diane over the phone for 2 PM.  Multiple attempts made to speak with Diane.  No answer.  HIPAA compliant voicemail left again.  Awaiting return call from sister. PMT will continue to follow and attempt goals of care discussions with patient's sister when she is available.  Thank you for allowing the Palliative Medicine Team to assist in the care of [facility].  Samara Deist L. Manon Hilding, FNP-BC Palliative Medicine Team Team Phone # [phone]  No charge

## 2022-12-27 NOTE — Progress Notes (Signed)
PHARMACY CONSULT NOTE  Pharmacy Consult for Electrolyte Monitoring and Replacement   Recent Labs: Potassium (mmol/L)  Date Value  12/27/2022 3.6  04/21/2012 4.8   Magnesium (mg/dL)  Date Value  78/29/5621 2.5 (H)   Calcium (mg/dL)  Date Value  30/86/5784 8.0 (L)   Calcium, Total (mg/dL)  Date Value  69/62/9528 8.8   Albumin (g/dL)  Date Value  41/32/4401 2.7 (L)  10/02/2022 4.2  04/21/2012 4.0   Phosphorus (mg/dL)  Date Value  02/72/5366 2.9   Sodium (mmol/L)  Date Value  12/27/2022 146 (H)  10/02/2022 140  04/21/2012 139     Assessment: 61 y.o. male w/ PMH of morbid obesity, alpha gal allergy, COPD, CHF, diabetes, polycythemia from chronic hypoxia, who presents with COPDe and CHFe. Pharmacy is asked to follow and replace electrolytes while in CCU  Diuretics: IV Lasix 60 mg q12h Diet/Fluids: FWF 350 ml q4h  Goal of Therapy:  Electrolytes within normal limits  Plan:  --Hypernatremia: continue D5@100ml /hr, FWF@350ml  q4h --No replacement currently indicated --Re-check electrolytes in AM  [facility] A Astrid Vides 12/27/2022 7:29 AM

## 2022-12-28 ENCOUNTER — Inpatient Hospital Stay: Payer: Medicaid Other

## 2022-12-28 DIAGNOSIS — R0902 Hypoxemia: Secondary | ICD-10-CM

## 2022-12-28 DIAGNOSIS — J9621 Acute and chronic respiratory failure with hypoxia: Secondary | ICD-10-CM | POA: Diagnosis not present

## 2022-12-28 DIAGNOSIS — R0602 Shortness of breath: Secondary | ICD-10-CM

## 2022-12-28 LAB — BLOOD GAS, ARTERIAL
Acid-Base Excess: 16 mmol/L — ABNORMAL HIGH (ref 0.0–2.0)
Bicarbonate: 43.5 mmol/L — ABNORMAL HIGH (ref 20.0–28.0)
FIO2: 0.7 %
MECHVT: 480 mL
O2 Saturation: 92.2 %
PEEP: 18 cmH2O
Patient temperature: 37
RATE: 31 resp/min
pCO2 arterial: 64 mmHg — ABNORMAL HIGH (ref 32–48)
pH, Arterial: 7.44 (ref 7.35–7.45)
pO2, Arterial: 58 mmHg — ABNORMAL LOW (ref 83–108)

## 2022-12-28 LAB — GLUCOSE, CAPILLARY
Glucose-Capillary: 298 mg/dL — ABNORMAL HIGH (ref 70–99)
Glucose-Capillary: 200 mg/dL — ABNORMAL HIGH (ref 70–99)

## 2022-12-28 LAB — RENAL FUNCTION PANEL
Albumin: 2.4 g/dL — ABNORMAL LOW (ref 3.5–5.0)
Anion gap: 8 (ref 5–15)
BUN: 40 mg/dL — ABNORMAL HIGH (ref 6–20)
CO2: 33 mmol/L — ABNORMAL HIGH (ref 22–32)
Calcium: 8.5 mg/dL — ABNORMAL LOW (ref 8.9–10.3)
Chloride: 107 mmol/L (ref 98–111)
Creatinine, Ser: 0.78 mg/dL (ref 0.61–1.24)
GFR, Estimated: >60 mL/min (ref >60)
Glucose, Bld: 266 mg/dL — ABNORMAL HIGH (ref 70–99)
Phosphorus: 2.9 mg/dL (ref 2.5–4.6)
Potassium: 4.2 mmol/L (ref 3.5–5.1)
Sodium: 148 mmol/L — ABNORMAL HIGH (ref 135–145)

## 2022-12-28 LAB — CBC
HCT: 55.9 % — ABNORMAL HIGH (ref 39.0–52.0)
Hemoglobin: 15.5 g/dL (ref 13.0–17.0)
MCH: 26.4 pg (ref 26.0–34.0)
MCHC: 27.7 g/dL — ABNORMAL LOW (ref 30.0–36.0)
MCV: 95.2 fL (ref 80.0–100.0)
Platelets: 184 10*3/uL (ref 150–400)
RBC: 5.87 MIL/uL — ABNORMAL HIGH (ref 4.22–5.81)
RDW: 18.6 % — ABNORMAL HIGH (ref 11.5–15.5)
WBC: 7.3 10*3/uL (ref 4.0–10.5)
nRBC: 0 % (ref 0.0–0.2)

## 2022-12-28 LAB — HEPATIC FUNCTION PANEL
ALT: 31 U/L (ref 0–44)
AST: 39 U/L (ref 15–41)
Albumin: 2.4 g/dL — ABNORMAL LOW (ref 3.5–5.0)
Alkaline Phosphatase: 137 U/L — ABNORMAL HIGH (ref 38–126)
Bilirubin, Direct: 0.6 mg/dL — ABNORMAL HIGH (ref 0.0–0.2)
Indirect Bilirubin: 0.7 mg/dL (ref 0.3–0.9)
Total Bilirubin: 1.3 mg/dL — ABNORMAL HIGH (ref 0.3–1.2)
Total Protein: 6.4 g/dL — ABNORMAL LOW (ref 6.5–8.1)

## 2022-12-28 LAB — MAGNESIUM: Magnesium: 2.7 mg/dL — ABNORMAL HIGH (ref 1.7–2.4)

## 2022-12-28 LAB — AMMONIA: Ammonia: 77 umol/L — ABNORMAL HIGH (ref 9–35)

## 2022-12-28 MED ORDER — INSULIN ASPART 100 UNIT/ML IJ SOLN
5.0000 [IU] | INTRAMUSCULAR | Status: DC
  Administered 2022-12-28: 5 [IU] via SUBCUTANEOUS
  Filled 2022-12-28: qty 1

## 2022-12-28 MED ORDER — SODIUM CHLORIDE 0.9 % IV SOLN: INTRAVENOUS | Status: DC

## 2022-12-28 MED ORDER — FREE WATER
250.0000 mL | Status: DC
  Administered 2022-12-28: 250 mL

## 2022-12-28 MED ORDER — LORAZEPAM 2 MG/ML IJ SOLN
2.0000 mg | INTRAMUSCULAR | Status: DC | PRN
  Filled 2022-12-28: qty 2

## 2022-12-28 MED ORDER — GLYCOPYRROLATE 0.2 MG/ML IJ SOLN: 0.2000 mg | INTRAMUSCULAR | Status: DC | PRN

## 2022-12-28 MED ORDER — POLYVINYL ALCOHOL 1.4 % OP SOLN: 1.0000 [drp] | Freq: Four times a day (QID) | OPHTHALMIC | Status: DC | PRN

## 2022-12-28 MED ORDER — ACETAMINOPHEN 325 MG PO TABS: 650.0000 mg | ORAL_TABLET | Freq: Four times a day (QID) | ORAL | Status: DC | PRN

## 2022-12-28 MED ORDER — FUROSEMIDE 10 MG/ML IJ SOLN: 60.0000 mg | Freq: Every day | INTRAMUSCULAR | Status: DC

## 2022-12-28 MED ORDER — FENTANYL CITRATE PF 50 MCG/ML IJ SOSY
25.0000 ug | PREFILLED_SYRINGE | INTRAMUSCULAR | Status: DC | PRN
  Filled 2022-12-28: qty 2

## 2022-12-28 MED ORDER — ACETAMINOPHEN 650 MG RE SUPP: 650.0000 mg | Freq: Four times a day (QID) | RECTAL | Status: DC | PRN

## 2022-12-30 LAB — GLUCOSE, CAPILLARY
Glucose-Capillary: 103 mg/dL — ABNORMAL HIGH (ref 70–99)
Glucose-Capillary: 120 mg/dL — ABNORMAL HIGH (ref 70–99)
Glucose-Capillary: 130 mg/dL — ABNORMAL HIGH (ref 70–99)
Glucose-Capillary: 139 mg/dL — ABNORMAL HIGH (ref 70–99)
Glucose-Capillary: 149 mg/dL — ABNORMAL HIGH (ref 70–99)
Glucose-Capillary: 165 mg/dL — ABNORMAL HIGH (ref 70–99)
Glucose-Capillary: 319 mg/dL — ABNORMAL HIGH (ref 70–99)

## 2023-01-06 NOTE — Progress Notes (Signed)
Pt extubated to room air and comfort measures per MD order.

## 2023-01-06 NOTE — Progress Notes (Signed)
Pt extubated to comfort per order, pt expired with no signs of distress at 1022 with family present, MD notified, 2 Rns verified, pts family took belongings with them, clothes, shoes, phone and charger.

## 2023-01-06 NOTE — Progress Notes (Signed)
PHARMACY CONSULT NOTE  Pharmacy Consult for Electrolyte Monitoring and Replacement   Recent Labs: Potassium (mmol/L)  Date Value  12/07/2022 4.2  04/21/2012 4.8   Magnesium (mg/dL)  Date Value  62/13/0865 2.5 (H)   Calcium (mg/dL)  Date Value  78/46/9629 8.5 (L)   Calcium, Total (mg/dL)  Date Value  52/84/1324 8.8   Albumin (g/dL)  Date Value  40/04/2724 2.4 (L)  10/02/2022 4.2  04/21/2012 4.0   Phosphorus (mg/dL)  Date Value  36/64/4034 2.9   Sodium (mmol/L)  Date Value  12/20/2022 148 (H)  10/02/2022 140  04/21/2012 139     Assessment: 62 y.o. male w/ PMH of morbid obesity, alpha gal allergy, COPD, CHF, diabetes, polycythemia from chronic hypoxia, who presents with COPDe and CHFe. Pharmacy is asked to follow and replace electrolytes while in CCU  Diuretics: IV Lasix 60 mg q12h > daily.  Diet/Fluids: FWF 350 ml q4h > 250 ml q2H.   Goal of Therapy:  Electrolytes within normal limits  Plan:  No replacement needed.  F/u with AM labs.   Ronnald Ramp, PharmD, BCPS 12/07/2022 8:34 AM

## 2023-01-06 NOTE — Progress Notes (Signed)
Reviewed chart, noted neurological changes overnight.  Rounded in ICU, spoke with Annabelle Harman, NP who recently had a conversation with Mr. Coppolino sister. Plan was for sister to visit at bedside for ongoing GOC conversations. Shared with Annabelle Harman, NP to alert me of when sister arrived at bedside.  Later spoke with Annabelle Harman, NP-meeting with sister during a previously scheduled family meeting for myself. Decision made for compassionate extubation to full comfort measures. Time of death noted 1022.  No Charge.  Leeanne Deed, DNP, AGNP-C Palliative Medicine  Please call Palliative Medicine team phone with any questions [phone]. For individual providers please see AMION.

## 2023-01-06 NOTE — IPAL (Signed)
  Interdisciplinary Goals of Care Family Meeting   Date carried out: 12/27/2022  Location of the meeting: Conference room  Member's involved: Nurse Practitioner and Family Member or next of kin  Durable Power of Attorney or acting medical decision maker: [name] and brother  Discussion: We discussed goals of care for Bryan Mitchell .  Discussed Neurological exam findings concerning for possible acute CVA. Pt continues to require high vent settings and will require tracheostomy and PEG tube placement if this is in line with pts wishes.  Following discussions pts family stated pt WOULD NOT want a tracheostomy or PEG tube.  They have decided they want to transition pt to Comfort Measures Only   Code status:   Code Status: DNR   Disposition: In-patient comfort care  Time spent for the meeting: 15 minutes    Zada Girt, AGNP  Pulmonary/Critical Care Pager [phone] (please enter 7 digits) PCCM Consult Pager [phone] (please enter 7 digits)

## 2023-01-06 NOTE — Progress Notes (Signed)
NAME:  Bryan Mitchell, MRN:  [id number], DOB:  09-03-1960, LOS: 9 ADMISSION DATE:  12/15/2022, CONSULTATION DATE:  12/20/22 REFERRING MD:  Cliffton Asters, NP, CHIEF COMPLAINT:  shortness of breath   History of Present Illness:  62 year old male presenting to [facility] ED from home via EMS on 01/05/2023 for evaluation of shortness of breath.  History provided per chart review as patient is in respiratory distress and unable to participate in interview at this time. Patient reported acute onset of worsening dyspnea over the last week with associated orthopnea and paroxysmal nocturnal dyspnea, worsening lower extremity edema and dyspnea on exertion.  He admitted to not taking his Lasix on a regular basis.  He denied fever/chills, but did endorse occasional cough.  Prior to EMS arrival the patient was administered 80 mg of Lasix.  Upon EMS arrival the patient was noted to be satting 80% on his home O2 of 5 L nasal cannula & tachycardic at 151.  EMS transition the patient to NRB and oxygen improved to 96%.  ED course: Upon arrival patient alert and appropriately following commands, reporting his dyspnea feels better than it did at home.  Patient transition to BiPAP support.  Imaging consistent with pulmonary edema and lab work suggestive of CHF exacerbation with an elevated BNP.  Labs also significant for mild hyperkalemia, mildly elevated troponin, AKI, hypochloremia, as well as mild thrombocytopenia.  Blood gas revealed significant respiratory acidosis. TRH consulted for admission due to suspected HFpEF exacerbation with AECOPD. Medications given: 60 mg Lasix Initial Vitals: 97.6, 22, 102, 138/64 and 96% on 60% FiO2 BiPAP Significant labs: (Labs/ Imaging personally reviewed) I, Cheryll Cockayne Rust-Chester, AGACNP-BC, personally viewed and interpreted this ECG. EKG Interpretation: Date: 12/29/2022 EKG Time: 21:36, Rate: 104, Rhythm: ST, QRS Axis: RAD Intervals: RBBB and LPFB, ST/T Wave abnormalities: Diffuse T  wave inversions, Narrative Interpretation: ST with RBBB and LPFB with diffuse T wave inversions  Chemistry: Na+: 137, K+: 5.3, BUN/Cr.:  53/1.26, Serum CO2/ AG: 32/8 Hematology: WBC: 6, Hgb: 18.4, plt: 145 Troponin: 20 > 19, BNP: 478.4, PCT: Pending, COVID-19 & Influenza A/B: Pending  ABG: 7.16/91/77/32.4 >> 7.16/101/68/36  CXR 12/23/2022: Cardiac enlargement with pulmonary vascular congestion, perihilar edema and probable effusions  Patient's respiratory acidosis did not improve after 4 hours on BiPAP with appropriate tidal volumes. PCCM consulted for assistance in management and monitoring due to acute on chronic hypoxic and hypercarbic respiratory failure requiring emergent intubation and mechanical ventilatory support.  Pertinent  Medical History  HFpEF COPD 5 L chronic oxygen HTN Polycythemia T2dm  Significant Hospital Events: Including procedures, antibiotic start and stop dates in addition to other pertinent events   12/16/2022: Admitted by [facility] with HFpEF exacerbation and AECOPD.  Overnight respiratory acidosis did not improve on BiPAP support and PCCM urgently consulted for emergent intubation and mechanical ventilatory support due to acute on chronic hypoxic and hypercarbic respiratory failure. 12/21/22: Patient is on 80% PRVC, +foley with lasix non oliguric , sedation with        fentanyl and propofol, off levophed now.   12/22/22: Patient had acute event overnight with severe resp distress        agitation attempt to self extubate.  He is on 100% and in shock requiring        levophed support 12/23/22: Pt severe hypoxia overnight requiring recruitment maneuver's and bagging with peep valve.  Current vent settings: PEEP 14/FiO2 100%. 12/24/22: Pt s/p proning for 18 hrs now in supine position oxygenation slowly improving vent settings: PEEP  12/FiO2 100%.  Will keep in supine position for 6 hrs and than prone for 18hrs today  12/25/22: Proned again overnight as ABG did not improve.  Discussed with [name], deemed NOT a candidate for ECMO. Cardiology consulted for frequent PVC's with ventricular bigeminy. 12/26/22: Remains critically ill on high vent support (100% FiO2/16 PEEP). Remains on Levophed, tracheal aspirate resulted with Staph Epidermidis. Worsening Hypernatremia. Code status changed to DNR, Palliative Care consulted for further GOC conversations. 12/27/22: very minimal improvement in clinical and respiratory status. Continues to do poorly. Ongoing GoC discussion.  2023-01-23: Neurological exam concerning for possible acute CVA no gag or corneal reflexes present and pt unresponsive on minimal sedation.  Pt too unstable from respiratory standpoint to travel for CT Head.  Continues to require HIGH vent settings: FiO2 70%/PEEP 18   Micro Data:   COVID 06/14>>negative  MRSA PCR 06/14>>negative  Blood x2 06/16>>negative  Resp (~20 pathogens) 06/16>>negative  Tracheal aspirate 06/17>>STAPHYLOCOCCUS EPIDERMIDIS   Anti-infectives (From admission, onward)    Start     Dose/Rate Route Frequency Ordered Stop   12/26/22 1800  linezolid (ZYVOX) tablet 600 mg        600 mg Per Tube Every 12 hours 12/26/22 1621     12/25/22 1100  amoxicillin-clavulanate (AUGMENTIN) 875-125 MG per tablet 1 tablet  Status:  Discontinued        1 tablet Per Tube Every 12 hours 12/25/22 1005 12/26/22 1621   12/21/22 1130  amoxicillin-clavulanate (AUGMENTIN) 875-125 MG per tablet 1 tablet        1 tablet Per Tube Every 12 hours 12/21/22 1031 12/24/22 2241   12/21/22 1130  azithromycin (ZITHROMAX) tablet 500 mg        500 mg Per Tube Daily 12/21/22 1031 12/22/22 0823   12/21/22 1025  cefTRIAXone (ROCEPHIN) 2 g in sodium chloride 0.9 % 100 mL IVPB  Status:  Discontinued        2 g 200 mL/hr over 30 Minutes Intravenous Every 24 hours 12/21/22 1026 12/21/22 1031   12/20/22 1100  azithromycin (ZITHROMAX) 500 mg in sodium chloride 0.9 % 250 mL IVPB  Status:  Discontinued        500 mg 250 mL/hr over  60 Minutes Intravenous Every 24 hours 12/20/22 0920 12/21/22 1031   12/20/22 0900  azithromycin (ZITHROMAX) 500 mg in sodium chloride 0.9 % 250 mL IVPB  Status:  Discontinued        500 mg 250 mL/hr over 60 Minutes Intravenous Every 24 hours 12/20/22 0743 12/20/22 0920   12/20/22 0900  cefTRIAXone (ROCEPHIN) 1 g in sodium chloride 0.9 % 100 mL IVPB  Status:  Discontinued        1 g 200 mL/hr over 30 Minutes Intravenous Every 24 hours 12/20/22 0743 12/21/22 1026      Interim History / Subjective:  As outlined above under significant events   Objective   Blood pressure 124/61, pulse 81, temperature 98.2 F (36.8 C), resp. rate (!) 31, height 5\' 8"  (1.727 m), weight (!) 137.5 kg, SpO2 92 %. CVP:  [10 mmHg-69 mmHg] 24 mmHg  Vent Mode: PRVC FiO2 (%):  [80 %-90 %] 85 % Set Rate:  [31 bmp] 31 bmp Vt Set:  [480 mL] 480 mL PEEP:  [18 cmH20] 18 cmH20 Plateau Pressure:  [24 cmH20] 24 cmH20   Intake/Output Summary (Last 24 hours) at 01/23/2023 0733 Last data filed at Jan 23, 2023 0617 Gross per 24 hour  Intake 1841.1 ml  Output 4675 ml  Net -  2833.9 ml   Filed Weights   12/25/22 0500 12/26/22 0444 12/27/22 0450  Weight: (!) 148.1 kg (!) 142.8 kg (!) 137.5 kg   Examination: General: Acute on chronically-ill appearing male, NAD mechanically intubated  HEENT: Supple, no JVD  Neuro: Sedated, unresponsive no gag/corneal reflexes present, bilateral pupils non reactive  CV: Sinus rhythm with PVC's, no m/r/g, 2+ radial/2+ distal pulses, no edema  Pulm: Faint rhonchi throughout, even, non labored  GI: +BS x4, soft, non distended Skin: Scattered ecchymosis   Extremities: Normal bulk and tone  Resolved Hospital Problem list     Assessment & Plan:  #Acute on chronic hypoxic /hypercapnic respiratory failure multifocal in the setting of pneumonia, HFpEF exacerbation/ARDS, bilateral pleural effusions, & AECOPD CTA Chest 12/22/22: negative for PE concerning for moderate bilateral pleural effusion   He has been prone for 2 days with no change in his ABG.  I do not believe he has any recruitable lung and we will not prone anymore ~ DEEMED NOT A ECMO CANDIDATE by [name] - Full vent support, implement lung protective strategies/ARDS protocol - Plateau pressures less than 30 cm H20 - Wean FiO2 & PEEP as tolerated to maintain O2 sats >88% - Follow intermittent Chest X-ray & ABG as needed - Spontaneous Breathing Trials when respiratory parameters met and mental status permits - Implement VAP Bundle - Bronchodilators & Pulmicort nebs - IV steroids - ABX as above - Bedside US 6/20 only showed small right sided pleural effusion which was too small for thoracentesis  #Acute on chronic HFpEF exacerbation #Shock: Septic +/- Cardiogenic +/- decrease preload from high PEEP requirements +/- Sedation related #Mildly Elevated Troponin, suspect due to demand ischemia #Frequent PVC's with ventricular Bigeminy, suspect due to hypoxia #Hyperlipidemia Echo 12/21/22: EF 60 to 65%, grade I diastolic dysfunction, aortic valve sclerosis present, mildly elevated pulmonary artery systolic pressure   - Continuous cardiac monitoring - Maintain MAP >65 - Vasopressors as needed to maintain MAP goal - HS Troponin peaked at 33 - Diuresis as BP and renal function permits~CVP 7 today and pt currently negative 5.6L will hold lasix dose today 06/22 - Cardiology following, appreciate input - Maintain goal potassium >4.0 and Magnesium 2.0 - Continue outpatient atorvastatin and aspirin   #STAPHYLOCOCCUS EPIDERMIDIS Pneumonia - Trend WBC and monitor curve  - Trend PCT  - Continue abx therapy as outlined above   #Acute kidney injury~resolved  #Hypernatremia~worsened  Baseline Cr: 0.77, Cr on admission: 1.26 - Trend BMP  - Replace electrolytes as indicated  - Monitor UOP  - Replace electrolytes as indicated ~ Pharmacy following for assistance with electrolyte replacement - Increase free water flushes to 250 ml  q2hrs   #Thrombocytopenia  - VTE px: subcutaneous arixtra  - Trend CBC - Monitor for s/sx of bleeding  - Transfuse for platelet count of <20,000  #T2DM - CBG's q4h; Target range of 140 to 180 - SSI, scheduled semglee, and will start novolog 5 units q4hrs for TF coverage  - Follow ICU Hypo/Hyperglycemia protocol  #Acute encephalopathy concerning for possible acute CVA  #Sedation needs in setting of Mechanical intubation pain/discomfort  - Pt currently unresponsive with absent corneal/gag reflexes concerning for possible acute CVA.  From a respiratory standpoint pt too unstable to obtain CT Head.  RN instructed to stop all sedation to further assess neurological function  - Fentanyl and Propofol as needed to maintain RASS goal - Avoid sedating medications as able - Daily wake up assessment  Pt is critically ill with multiorgan  failure.  Prognosis is extremely guarded, high risk for further decompensation, cardiac arrest, and death.  Given current critical illness superimposed on multiple chronic co morbidities, overall long term prognosis is poor. Best Practice (right click and "Reselect all SmartList Selections" daily)  Diet/type: NPO; continue TF's per dietitian recommendations  DVT prophylaxis: subcutaneous arixtra  GI prophylaxis: H2B Lines: central line and arterial lines. Yes and still needed  Foley:  Yes, and it is still needed Code Status:  DNR Last date of multidisciplinary goals of care discussion [12/07/2022]  06/22: Spoke with pts sister Chalmers Cater via telephone to discuss decline in pts neurological exam and also informed Ms Marca Ancona pt is too unstable from a respiratory standpoint to obtain CT Head.  Encouraged Ms. Marca Ancona to come to the hospital today to further discuss pts condition and goals of care.  Ms. Marca Ancona stated she would come to the hospital today  Critical care time: 55 minutes    Zada Girt, [facility]  Pulmonary/Critical Care Pager [phone] (please enter  7 digits) PCCM Consult Pager [phone] (please enter 7 digits)

## 2023-01-06 NOTE — Progress Notes (Signed)
Decreased Propofol from 20 mcg/hr to 10 mcg, Also decreased fentanyl from 300 mcg/hr to 50 mcg/hr. No change in responsiveness, pupils are still non-reactive, no gag reflex, no response to painful or verbal stimuli.

## 2023-01-06 NOTE — Death Summary Note (Signed)
DEATH SUMMARY   Patient Details  Name: Bryan Mitchell MRN: [id number] DOB: 03-Jun-1961  Admission/Discharge Information   Admit Date:  2023/01/10  Date of Death:  01/19/2023  Time of Death:  10:22 am   Length of Stay: 9  Referring Physician: Sandre Kitty, MD   Reason(s) for Hospitalization  Shortness of Breath   Diagnoses  Preliminary cause of death: Acute respiratory distress syndrome (ARDS) (HCC) Secondary Diagnoses (including complications and co-morbidities):  Principal Problem:   Acute on chronic respiratory failure with hypoxia (HCC) Active Problems:   Acute on chronic diastolic (congestive) heart failure (HCC)   AKI (acute kidney injury) (HCC)   Hyperkalemia   Acute respiratory failure (HCC)   SOB (shortness of breath)   Congestive heart failure (HCC)   Hypoxia   Hypernatremia   Acute on chronic HFpEF    OSA/OHS    Acute COPD exacerbation    Staphylococcus epidermidis pneumonia    Hypernatremia    Thrombocytopenia    Type 2 Diabetes Mellitus    Septic and cardiogenic shock    Bilateral pleural effusions   Brief Hospital Course (including significant findings, care, treatment, and services provided and events leading to death)  Bryan Mitchell is a 62 y.o. year old male who presented to [facility] ED from home via EMS on 10-Jan-2023 for evaluation of shortness of breath.   Patient reported acute onset of worsening dyspnea onset a week prior to ER presentation with associated orthopnea and paroxysmal nocturnal dyspnea, worsening lower extremity edema and dyspnea on exertion.  He admitted to not taking his Lasix on a regular basis.  He denied fever/chills, but did endorse occasional cough.  Prior to EMS arrival the patient was administered 80 mg of Lasix.   Upon EMS arrival the patient was noted to be satting 80% on his home O2 of 5 L nasal cannula & tachycardic at 151.  EMS transition the patient to NRB and oxygen improved to 96%.   ED course: Upon arrival to the ER  patient was alert and appropriately following commands, reported his dyspnea felt better than it did at home.  Patient transitioned to BiPAP support.  Imaging consistent with pulmonary edema and lab work suggestive of CHF exacerbation with an elevated BNP.  Labs were significant for mild hyperkalemia, mildly elevated troponin, AKI, hypochloremia, as well as mild thrombocytopenia.  Blood gas revealed significant respiratory acidosis. TRH consulted for admission due to suspected HFpEF exacerbation with AECOPD.  See detailed hospital course below under significant events.    Significant Hospital Events: Including procedures, antibiotic start and stop dates in addition to other pertinent events   2023/01/10: Admitted by [facility] with HFpEF exacerbation and AECOPD.  Overnight respiratory acidosis did not improve on BiPAP support and PCCM urgently consulted for emergent intubation and mechanical ventilatory support due to acute on chronic hypoxic and hypercarbic respiratory failure. 12/21/22: Patient is on 80% PRVC, +foley with lasix non oliguric , sedation with        fentanyl and propofol, off levophed now.   12/22/22: Patient had acute event overnight with severe resp distress        agitation attempt to self extubate.  He is on 100% and in shock requiring        levophed support 12/23/22: Pt severe hypoxia overnight requiring recruitment maneuver's and bagging with peep valve.  Current vent settings: PEEP 14/FiO2 100%. 12/24/22: Pt s/p proning for 18 hrs now in supine position oxygenation slowly improving vent settings: PEEP 12/FiO2 100%.  Will  keep in supine position for 6 hrs and than prone for 18hrs today  12/25/22: Proned again overnight as ABG did not improve. Discussed with [name], deemed NOT a candidate for ECMO. Cardiology consulted for frequent PVC's with ventricular bigeminy. 12/26/22: Remains critically ill on high vent support (100% FiO2/16 PEEP). Remains on Levophed, tracheal aspirate resulted with  Staph Epidermidis. Worsening Hypernatremia. Code status changed to DNR, Palliative Care consulted for further GOC conversations. 12/27/22: very minimal improvement in clinical and respiratory status. Continues to do poorly. Ongoing GoC discussion.  12/13/2022: Neurological exam concerning for possible acute CVA no gag or corneal reflexes present and pt unresponsive on minimal sedation. Continued to require HIGH vent settings: FiO2 70%/PEEP 18.  Pts family arrived at bedside and following goals of care discussions pts family decided to terminally extubate pt and transition to Comfort Measures Only.  Pt expired on 12/21/2022 at 10:22 am with family present at bedside   Pertinent Labs and Studies  Significant Diagnostic Studies DG Chest [facility] 1 View  Result Date: 12/12/2022 CLINICAL DATA:  Respiratory failure. EXAM: PORTABLE CHEST 1 VIEW COMPARISON:  12/27/2022 FINDINGS: ETT tube is above the carina. Enteric tube within the stomach. Left IJ catheter is identified with tip in the SVC. Stable cardiomediastinal contours. Unchanged small left pleural effusion. Mild pulmonary edema. New airspace consolidation within the right upper lobe. Persistent retrocardiac consolidation. IMPRESSION: 1. New airspace consolidation within the right upper lobe. 2. Persistent retrocardiac consolidation 3. Small left effusion and mild interstitial edema. 4. Stable support apparatus. Electronically Signed   By: Signa Kell M.D.   On: 01/01/2023 08:17   DG Chest Port 1 View  Result Date: 12/27/2022 CLINICAL DATA:  [id number] Acute respiratory failure with hypoxia and hypercarbia (HCC) [id number]. EXAM: PORTABLE CHEST 1 VIEW COMPARISON:  12/25/2022. FINDINGS: Unchanged endotracheal tube with tip projecting over midthoracic trachea. Esophageal temperature probe in place. Enteric tube courses below the left hemidiaphragm, beyond the field of view. Unchanged left IJ approach central venous catheter with tip projecting over the upper SVC.  Decreased right pleural effusion. Unchanged small left pleural effusion with persistent bibasilar atelectasis. Mild pulmonary venous congestion. Stable cardiac and mediastinal contours. IMPRESSION: 1. Decreased right pleural effusion. Unchanged small left pleural effusion with persistent bibasilar atelectasis. 2. Mild pulmonary venous congestion. Electronically Signed   By: Orvan Falconer M.D.   On: 12/27/2022 08:31   DG Chest Port 1 View  Result Date: 12/25/2022 CLINICAL DATA:  Encounter for intubation. EXAM: PORTABLE CHEST 1 VIEW COMPARISON:  Chest x-ray December 24, 2022. FINDINGS: Endotracheal tube tip is approximately 3.1 cm above the carina. Layering bilateral pleural effusions. Overlying bilateral opacities. No visible pneumothorax. Gastric tube courses below diaphragm with the tip likely in the stomach. IMPRESSION: 1. Endotracheal tube tip is approximately 3.1 cm above the carina. 2. Layering bilateral pleural effusions. Overlying bibasilar opacities could represent atelectasis, aspiration, and/or pneumonia. Electronically Signed   By: Feliberto Harts M.D.   On: 12/25/2022 12:03   DG Chest Port 1 View  Result Date: 12/24/2022 CLINICAL DATA:  Respiratory failure EXAM: PORTABLE CHEST 1 VIEW COMPARISON:  Previous studies including the examination of 12/22/2022 FINDINGS: Transverse diameter of heart is increased. Central pulmonary vessels are prominent. There is haziness in right mid and right lower lung fields. There is increased density in left lower lung field. There is blunting of both lateral CP angles. There is no pneumothorax. Tip of endotracheal tube is 3.4 cm above the carina. Enteric tube is noted traversing the esophagus. Tip  of left IJ central venous catheter is seen in superior vena cava. IMPRESSION: Cardiomegaly. Central pulmonary vessels are prominent. Haziness in right lung may be partly due to layering pleural effusion and underlying atelectasis/pneumonia. There is increased density in  left lower lung field suggesting atelectasis and pleural effusion with some interval improvement. Electronically Signed   By: Ernie Avena M.D.   On: 12/24/2022 10:13   CT Angio Chest Pulmonary Embolism (PE) W or WO Contrast  Result Date: 12/22/2022 CLINICAL DATA:  Acute on chronic respiratory failure. Hypoxia. Shortness of breath. High probability for pulmonary embolism. EXAM: CT ANGIOGRAPHY CHEST WITH CONTRAST TECHNIQUE: Multidetector CT imaging of the chest was performed using the standard protocol during bolus administration of intravenous contrast. Multiplanar CT image reconstructions and MIPs were obtained to evaluate the vascular anatomy. RADIATION DOSE REDUCTION: This exam was performed according to the departmental dose-optimization program which includes automated exposure control, adjustment of the mA and/or kV according to patient size and/or use of iterative reconstruction technique. CONTRAST:  155 mL OMNIPAQUE IOHEXOL 350 MG/ML SOLN COMPARISON:  03/03/2022 FINDINGS: Cardiovascular: Satisfactory opacification of pulmonary arteries noted, however the lower lobe pulmonary arteries are not well evaluated due to pulmonary opacity no central pulmonary embolism is seen. No evidence of thoracic aortic dissection or aneurysm. Mediastinum/Nodes: No masses or pathologically enlarged lymph nodes identified. Lungs/Pleura: Endotracheal tube and nasogastric tube are seen in place. Moderate bilateral pleural effusions present with atelectasis or consolidation in both lower lobes. No evidence of central endobronchial obstruction. Upper abdomen: Hepatic cirrhosis and ascites noted. Musculoskeletal: No suspicious bone lesions identified. Review of the MIP images confirms the above findings. IMPRESSION: Lower lobe pulmonary arteries are not well evaluated due to pulmonary opacity, however there is no evidence of central pulmonary embolism. Moderate bilateral pleural effusions, and bilateral lower lobe  atelectasis or consolidation. Hepatic cirrhosis and ascites. Electronically Signed   By: Danae Orleans M.D.   On: 12/22/2022 14:46   DG Chest Port 1 View  Addendum Date: 12/22/2022   ADDENDUM REPORT: 12/22/2022 01:36 ADDENDUM: Voice recognition error occurred in the impression. The impression should read: No pneumothorax following central line placement. Electronically Signed   By: Alcide Clever M.D.   On: 12/22/2022 01:36   Result Date: 12/22/2022 CLINICAL DATA:  Status post central line placement EXAM: PORTABLE CHEST 1 VIEW COMPARISON:  12/21/2022 FINDINGS: Endotracheal tube and gastric catheter are noted in satisfactory position. Left jugular central line is noted in the proximal superior vena cava. No pneumothorax is noted. Persistent left retrocardiac opacification is noted with associated small effusion. Right-sided effusion is seen as well. No bony abnormality is noted. IMPRESSION: Pneumothorax following central line placement. Bilateral effusions and left retrocardiac consolidation. Electronically Signed: By: Alcide Clever M.D. On: 12/22/2022 00:58   DG Abd 1 View  Result Date: 12/22/2022 CLINICAL DATA:  Abdominal distension EXAM: ABDOMEN - 1 VIEW COMPARISON:  12/20/2022 FINDINGS: Gastric catheter is noted with the tip in the stomach. Proximal side port lies in the distal esophagus. This should be advanced deeper into the stomach. No obstructive changes are noted. IMPRESSION: Gastric catheter as described. This could be advanced deeper into the stomach. Electronically Signed   By: Alcide Clever M.D.   On: 12/22/2022 01:09   ECHOCARDIOGRAM COMPLETE  Result Date: 12/21/2022    ECHOCARDIOGRAM REPORT   Patient Name:   WILFRID HYSER Date of Exam: 12/21/2022 Medical Rec #:  [id number]      Height:       68.0 in Accession #:  [id number]     Weight:       360.3 lb Date of Birth:  Sep 17, 1960      BSA:          2.625 m Patient Age:    61 years       BP:           122/62 mmHg Patient Gender: M               HR:           78 bpm. Exam Location:  [facility] Procedure: 2D Echo Indications:     Cardiomyopathy- Unspecified I42.9  History:         Patient has prior history of Echocardiogram examinations, most                  recent 07/30/2022.  Sonographer:     Overton Mam RDCS, FASE Referring Phys:  [id number] BRITTON L [place] Diagnosing Phys: Julien Nordmann MD  Sonographer Comments: Technically difficult study due to poor echo windows, suboptimal apical window, echo performed with patient supine and on artificial respirator and patient is obese. Image acquisition challenging due to patient body habitus. Unable to use Definity IV ultrasound imaging agent on this study. IMPRESSIONS  1. Left ventricular ejection fraction, by estimation, is 60 to 65%. The left ventricle has normal function. The left ventricle has no regional wall motion abnormalities. Left ventricular diastolic parameters are consistent with Grade I diastolic dysfunction (impaired relaxation).  2. Right ventricular systolic function is normal. The right ventricular size is mildly enlarged. There is mildly elevated pulmonary artery systolic pressure. The estimated right ventricular systolic pressure is 37.7 mmHg.  3. The mitral valve is normal in structure. No evidence of mitral valve regurgitation. No evidence of mitral stenosis.  4. The aortic valve has an indeterminant number of cusps. Aortic valve regurgitation is not visualized. Aortic valve sclerosis is present, with no evidence of aortic valve stenosis.  5. The inferior vena cava is dilated in size with >50% respiratory variability, suggesting right atrial pressure of 8 mmHg. FINDINGS  Left Ventricle: Left ventricular ejection fraction, by estimation, is 60 to 65%. The left ventricle has normal function. The left ventricle has no regional wall motion abnormalities. The left ventricular internal cavity size was normal in size. There is  no left ventricular hypertrophy. Left ventricular diastolic  parameters are consistent with Grade I diastolic dysfunction (impaired relaxation). Right Ventricle: The right ventricular size is mildly enlarged. No increase in right ventricular wall thickness. Right ventricular systolic function is normal. There is mildly elevated pulmonary artery systolic pressure. The tricuspid regurgitant velocity is 2.86 m/s, and with an assumed right atrial pressure of 5 mmHg, the estimated right ventricular systolic pressure is 37.7 mmHg. Left Atrium: Left atrial size was normal in size. Right Atrium: Right atrial size was normal in size. Pericardium: There is no evidence of pericardial effusion. Mitral Valve: The mitral valve is normal in structure. There is mild calcification of the mitral valve leaflet(s). No evidence of mitral valve regurgitation. No evidence of mitral valve stenosis. Tricuspid Valve: The tricuspid valve is normal in structure. Tricuspid valve regurgitation is mild . No evidence of tricuspid stenosis. Aortic Valve: The aortic valve has an indeterminant number of cusps. Aortic valve regurgitation is not visualized. Aortic valve sclerosis is present, with no evidence of aortic valve stenosis. Aortic valve peak gradient measures 9.5 mmHg. Pulmonic Valve: The pulmonic valve was normal in structure. Pulmonic valve regurgitation is not visualized. No evidence  of pulmonic stenosis. Aorta: The aortic root is normal in size and structure. Venous: The inferior vena cava is dilated in size with greater than 50% respiratory variability, suggesting right atrial pressure of 8 mmHg. IAS/Shunts: No atrial level shunt detected by color flow Doppler.  LEFT VENTRICLE PLAX 2D LVIDd:         4.10 cm   Diastology LVIDs:         2.70 cm   LV e' medial:   5.00 cm/s LV PW:         1.30 cm   LV E/e' medial: 21.0 LV IVS:        1.20 cm LVOT diam:     1.90 cm LV SV:         75 LV SV Index:   28 LVOT Area:     2.84 cm  RIGHT VENTRICLE RV Basal diam:  4.50 cm RV S prime:     11.90 cm/s TAPSE  (M-mode): 1.7 cm LEFT ATRIUM           Index        RIGHT ATRIUM           Index LA diam:      3.40 cm 1.30 cm/m   RA Area:     19.80 cm LA Vol (A4C): 42.2 ml 16.08 ml/m  RA Volume:   56.60 ml  21.56 ml/m  AORTIC VALVE                 PULMONIC VALVE AV Area (Vmax): 2.45 cm     PV Vmax:       1.17 m/s AV Vmax:        154.00 cm/s  PV Peak grad:  5.5 mmHg AV Peak Grad:   9.5 mmHg LVOT Vmax:      133.00 cm/s LVOT Vmean:     96.500 cm/s LVOT VTI:       0.263 m  AORTA Ao Root diam: 3.60 cm Ao Asc diam:  3.40 cm MITRAL VALVE                TRICUSPID VALVE MV Area (PHT): 2.75 cm     TR Peak grad:   32.7 mmHg MV Decel Time: 276 msec     TR Vmax:        286.00 cm/s MV E velocity: 105.00 cm/s MV A velocity: 111.00 cm/s  SHUNTS MV E/A ratio:  0.95         Systemic VTI:  0.26 m                             Systemic Diam: 1.90 cm Julien Nordmann MD Electronically signed by Julien Nordmann MD Signature Date/Time: 12/21/2022/11:27:51 AM    Final    DG Chest Port 1 View  Result Date: 12/21/2022 CLINICAL DATA:  Respiratory failure, hypoxia EXAM: PORTABLE CHEST - 1 VIEW COMPARISON:  12/20/2022 FINDINGS: Low lung volumes. Bilateral interstitial edema or infiltrates. More focal consolidation at the left lung base internalized free right apex. Probable pleural effusions. Endotracheal tube and feeding tube stable. Visualized bones unremarkable. IMPRESSION: Low lung volumes with bilateral interstitial edema or infiltrates and left basilar consolidation. Electronically Signed   By: Corlis Leak M.D.   On: 12/21/2022 07:10   DG Abd 1 View  Result Date: 12/20/2022 CLINICAL DATA:  62 year old male status post orogastric tube placement. EXAM: ABDOMEN - 1 VIEW COMPARISON:  Abdominal radiograph 04/10/2022. FINDINGS: Tip of  enteric tube is in the body of the stomach with side port well distal to the gastroesophageal junction. Lower abdomen is incompletely imaged. Some gas is noted in the visualized portions of the colon. Opacity at the  base of the left hemithorax. IMPRESSION: 1. Tip of enteric tube is in the body of the stomach. 2. Atelectasis and/or consolidation in the left lung base with small left pleural effusion. Electronically Signed   By: Trudie Reed M.D.   On: 12/20/2022 07:00   DG Chest Port 1 View  Result Date: 12/20/2022 CLINICAL DATA:  62 year old male status post intubation and orogastric tube placement. EXAM: PORTABLE CHEST 1 VIEW COMPARISON:  Chest x-ray Dec 25, 2022. FINDINGS: An endotracheal tube is in place with tip 5.0 cm above the carina. A nasogastric tube is seen extending into the stomach, however, the tip of the nasogastric tube extends below the lower margin of the image. Lung volumes are normal. There is cephalization of the pulmonary vasculature, indistinctness of the interstitial markings, and patchy airspace disease throughout the lungs bilaterally suggestive of moderate pulmonary edema. Small right and moderate left pleural effusion. Bibasilar opacities which may reflect areas of atelectasis and/or consolidation. No pneumothorax. The patient is rotated to the left on today's exam, resulting in distortion of the mediastinal contours and reduced diagnostic sensitivity and specificity for mediastinal pathology. Atherosclerotic calcifications are noted in the thoracic aorta. IMPRESSION: 1. Support apparatus, as above. 2. The appearance of the chest is favored to reflect congestive heart failure, as above, although underlying pneumonia, particularly in the lung bases is difficult to entirely exclude. Electronically Signed   By: Trudie Reed M.D.   On: 12/20/2022 06:10   DG Chest Portable 1 View  Result Date: 2022-12-25 CLINICAL DATA:  Shortness of breath EXAM: PORTABLE CHEST 1 VIEW COMPARISON:  03/12/2022 FINDINGS: Shallow inspiration. Cardiac enlargement with pulmonary vascular congestion and bilateral perihilar infiltrates, likely edema. Probable small pleural effusions. No pneumothorax. Mediastinal  contours appear intact. IMPRESSION: Cardiac enlargement with pulmonary vascular congestion, perihilar edema, and probable effusions. Electronically Signed   By: Burman Nieves M.D.   On: 2022/12/25 22:10    Microbiology Recent Results (from the past 240 hour(s))  SARS Coronavirus 2 by RT PCR (hospital order, performed in [facility] hospital lab) *cepheid single result test* Anterior Nasal Swab     Status: None   Collection Time: 12/20/22  6:20 AM   Specimen: Anterior Nasal Swab  Result Value Ref Range Status   SARS Coronavirus 2 by RT PCR NEGATIVE NEGATIVE Final    Comment: (NOTE) SARS-CoV-2 target nucleic acids are NOT DETECTED.  The SARS-CoV-2 RNA is generally detectable in upper and lower respiratory specimens during the acute phase of infection. The lowest concentration of SARS-CoV-2 viral copies this assay can detect is 250 copies / mL. A negative result does not preclude SARS-CoV-2 infection and should not be used as the sole basis for treatment or other patient management decisions.  A negative result may occur with improper specimen collection / handling, submission of specimen other than nasopharyngeal swab, presence of viral mutation(s) within the areas targeted by this assay, and inadequate number of viral copies (<250 copies / mL). A negative result must be combined with clinical observations, patient history, and epidemiological information.  Fact Sheet for Patients:   RoadLapTop.co.za  Fact Sheet for Healthcare Providers: http://kim-miller.com/  This test is not yet approved or  cleared by the [place] FDA and has been authorized for detection and/or diagnosis of SARS-CoV-2 by FDA under  an Emergency Use Authorization (EUA).  This EUA will remain in effect (meaning this test can be used) for the duration of the COVID-19 declaration under Section 564(b)(1) of the Act, 21 U.S.C. section 360bbb-3(b)(1), unless the  authorization is terminated or revoked sooner.  Performed at [facility], [address] [place], [place] [address]   MRSA Next Gen by PCR, Nasal     Status: None   Collection Time: 12/20/22  6:20 AM   Specimen: Nasal Mucosa; Nasal Swab  Result Value Ref Range Status   MRSA by PCR Next Gen NOT DETECTED NOT DETECTED Final    Comment: (NOTE) The GeneXpert MRSA Assay (FDA approved for NASAL specimens only), is one component of a comprehensive MRSA colonization surveillance program. It is not intended to diagnose MRSA infection nor to guide or monitor treatment for MRSA infections. Test performance is not FDA approved in patients less than 34 years old. Performed at [facility], [address] [place], [place] [address]   Culture, blood (Routine X 2) w Reflex to ID Panel     Status: None   Collection Time: 12/22/22  8:33 PM   Specimen: BLOOD LEFT ARM  Result Value Ref Range Status   Specimen Description BLOOD LEFT ARM  Final   Special Requests   Final    BOTTLES DRAWN AEROBIC AND ANAEROBIC Blood Culture adequate volume   Culture   Final    NO GROWTH 5 DAYS Performed at [facility], [address] [place], [place] [address]    Report Status 12/27/2022 FINAL  Final  Respiratory (~20 pathogens) panel by PCR     Status: None   Collection Time: 12/22/22  8:34 PM   Specimen: Nasopharyngeal Swab; Respiratory  Result Value Ref Range Status   Adenovirus NOT DETECTED NOT DETECTED Final   Coronavirus 229E NOT DETECTED NOT DETECTED Final    Comment: (NOTE) The Coronavirus on the Respiratory Panel, DOES NOT test for the novel  Coronavirus (2019 nCoV)    Coronavirus HKU1 NOT DETECTED NOT DETECTED Final   Coronavirus NL63 NOT DETECTED NOT DETECTED Final   Coronavirus OC43 NOT DETECTED NOT DETECTED Final   Metapneumovirus NOT DETECTED NOT DETECTED Final   Rhinovirus / Enterovirus NOT DETECTED NOT DETECTED Final   Influenza A NOT DETECTED NOT DETECTED  Final   Influenza B NOT DETECTED NOT DETECTED Final   Parainfluenza Virus 1 NOT DETECTED NOT DETECTED Final   Parainfluenza Virus 2 NOT DETECTED NOT DETECTED Final   Parainfluenza Virus 3 NOT DETECTED NOT DETECTED Final   Parainfluenza Virus 4 NOT DETECTED NOT DETECTED Final   Respiratory Syncytial Virus NOT DETECTED NOT DETECTED Final   Bordetella pertussis NOT DETECTED NOT DETECTED Final   Bordetella Parapertussis NOT DETECTED NOT DETECTED Final   Chlamydophila pneumoniae NOT DETECTED NOT DETECTED Final   Mycoplasma pneumoniae NOT DETECTED NOT DETECTED Final    Comment: Performed at [facility], [address], [place], [place] [address]  Culture, blood (Routine X 2) w Reflex to ID Panel     Status: None   Collection Time: 12/22/22  9:39 PM   Specimen: BLOOD  Result Value Ref Range Status   Specimen Description BLOOD BLOOD RIGHT HAND  Final   Special Requests   Final    BOTTLES DRAWN AEROBIC AND ANAEROBIC Blood Culture adequate volume   Culture   Final    NO GROWTH 5 DAYS Performed at [facility], [address], [place], [place] [address]  Report Status 12/27/2022 FINAL  Final  Culture, Respiratory w Gram Stain     Status: None   Collection Time: 12/23/22  7:41 AM   Specimen: Tracheal Aspirate; Respiratory  Result Value Ref Range Status   Specimen Description   Final    TRACHEAL ASPIRATE Performed at [facility], [address], [place], [place] [address]    Special Requests   Final    NONE Performed at [facility], [address] [place], [place] [address]    Gram Stain   Final    FEW WBC PRESENT, PREDOMINANTLY PMN RARE GRAM NEGATIVE RODS Performed at [facility], [address], [place], [place] [address]    Culture ABUNDANT STAPHYLOCOCCUS EPIDERMIDIS  Final   Report Status 12/26/2022 FINAL  Final   Organism ID, Bacteria STAPHYLOCOCCUS EPIDERMIDIS  Final      Susceptibility   Staphylococcus epidermidis - MIC*     CIPROFLOXACIN <=0.5 SENSITIVE Sensitive     ERYTHROMYCIN >=8 RESISTANT Resistant     GENTAMICIN <=0.5 SENSITIVE Sensitive     OXACILLIN >=4 RESISTANT Resistant     TETRACYCLINE <=1 SENSITIVE Sensitive     VANCOMYCIN 1 SENSITIVE Sensitive     TRIMETH/SULFA <=10 SENSITIVE Sensitive     CLINDAMYCIN >=8 RESISTANT Resistant     RIFAMPIN <=0.5 SENSITIVE Sensitive     Inducible Clindamycin NEGATIVE Sensitive     * ABUNDANT STAPHYLOCOCCUS EPIDERMIDIS    Lab Basic Metabolic Panel: Recent Labs  Lab 12/23/22 0416 12/24/22 0255 12/25/22 0504 12/25/22 1225 12/25/22 2202 12/26/22 0412 12/27/22 0345 12/25/2022 0550 12/09/2022 0813  NA 143 146* 154*  --  148* 153* 146* 148*  --   K 3.9 3.7 3.4* 4.0 4.1 3.7 3.6 4.2  --   CL 99 103 111  --  105 108 105 107  --   CO2 35* 34* 35*  --  33* 37* 34* 33*  --   GLUCOSE 138* 129* 200*  --  398* 341* 135* 266*  --   BUN 48* 40* 27*  --  31* 33* 34* 40*  --   CREATININE 1.36* 1.05 0.78  --  0.95 1.00 0.85 0.78  --   CALCIUM 8.0* 7.8* 8.5*  --  8.3* 8.7* 8.0* 8.5*  --   MG 2.4 2.5* 2.7* 2.5*  --   --   --   --  2.7*  PHOS 5.2* 4.1 2.9  --   --   --  2.9 2.9  --    Liver Function Tests: Recent Labs  Lab 12/24/22 0255 12/25/22 0504 12/27/22 0345 01/04/2023 0550 01/05/2023 0741  AST  --   --   --   --  39  ALT  --   --   --   --  31  ALKPHOS  --   --   --   --  137*  BILITOT  --   --   --   --  1.3*  PROT  --   --   --   --  6.4*  ALBUMIN 2.8* 3.0* 2.7* 2.4* 2.4*   No results for input(s): "LIPASE", "AMYLASE" in the last 168 hours. Recent Labs  Lab 12/16/2022 0740  AMMONIA 77*   CBC: Recent Labs  Lab 12/24/22 0255 12/25/22 0504 12/26/22 0412 12/27/22 0345 01/02/2023 0540  WBC 6.1 8.0 8.4 9.6 7.3  HGB 16.6 16.7 15.8 15.2 15.5  HCT 57.1* 60.8* 57.5* 54.9* 55.9*  MCV 91.2 95.6 95.7 95.1 95.2  PLT  75* 77* 105* 148* 184   Cardiac Enzymes: No results for input(s): "CKTOTAL", "CKMB", "CKMBINDEX", "TROPONINI" in the last 168 hours. Sepsis  Labs: Recent Labs  Lab 12/23/22 0415 12/23/22 0416 12/25/22 0504 12/26/22 0412 12/27/22 0345 12/09/2022 0540  PROCALCITON 0.23  --   --   --   --   --   WBC  --    < > 8.0 8.4 9.6 7.3   < > = values in this interval not displayed.    Procedures/Operations  Mechanical Intubation  Right radial arterial line placement  Left internal jugular central line placement   Zada Girt, AGNP  Pulmonary/Critical Care Pager [phone] (please enter 7 [phone]) PCCM Consult Pager [phone] (please enter 7 digits)

## 2023-01-06 NOTE — Progress Notes (Signed)
Notified NP Ouma of unreactive pupils, provider at bedside to verify assessment. No further orders received.

## 2023-01-06 DEATH — deceased

## 2023-02-26 ENCOUNTER — Ambulatory Visit: Payer: Medicaid Other
